# Patient Record
Sex: Female | Born: 1955 | Race: Black or African American | Hispanic: No | Smoking: Former smoker
Health system: Southern US, Community
[De-identification: ages and names within clinical notes are randomized; demographics above are authoritative.]

## PROBLEM LIST (undated history)

## (undated) DIAGNOSIS — R600 Localized edema: Secondary | ICD-10-CM

## (undated) DIAGNOSIS — M549 Dorsalgia, unspecified: Secondary | ICD-10-CM

## (undated) DIAGNOSIS — M2550 Pain in unspecified joint: Secondary | ICD-10-CM

## (undated) DIAGNOSIS — E785 Hyperlipidemia, unspecified: Secondary | ICD-10-CM

## (undated) DIAGNOSIS — T4145XA Adverse effect of unspecified anesthetic, initial encounter: Secondary | ICD-10-CM

## (undated) DIAGNOSIS — I10 Essential (primary) hypertension: Secondary | ICD-10-CM

## (undated) DIAGNOSIS — C801 Malignant (primary) neoplasm, unspecified: Secondary | ICD-10-CM

## (undated) DIAGNOSIS — R5383 Other fatigue: Secondary | ICD-10-CM

## (undated) DIAGNOSIS — D689 Coagulation defect, unspecified: Secondary | ICD-10-CM

## (undated) DIAGNOSIS — R7303 Prediabetes: Secondary | ICD-10-CM

## (undated) DIAGNOSIS — Z8711 Personal history of peptic ulcer disease: Secondary | ICD-10-CM

## (undated) DIAGNOSIS — K5900 Constipation, unspecified: Secondary | ICD-10-CM

## (undated) DIAGNOSIS — T8859XA Other complications of anesthesia, initial encounter: Secondary | ICD-10-CM

## (undated) DIAGNOSIS — R0602 Shortness of breath: Secondary | ICD-10-CM

## (undated) DIAGNOSIS — D509 Iron deficiency anemia, unspecified: Secondary | ICD-10-CM

## (undated) DIAGNOSIS — C189 Malignant neoplasm of colon, unspecified: Secondary | ICD-10-CM

## (undated) DIAGNOSIS — I82409 Acute embolism and thrombosis of unspecified deep veins of unspecified lower extremity: Secondary | ICD-10-CM

## (undated) DIAGNOSIS — E739 Lactose intolerance, unspecified: Secondary | ICD-10-CM

## (undated) DIAGNOSIS — I2699 Other pulmonary embolism without acute cor pulmonale: Secondary | ICD-10-CM

## (undated) HISTORY — DX: Other pulmonary embolism without acute cor pulmonale: I26.99

## (undated) HISTORY — DX: Prediabetes: R73.03

## (undated) HISTORY — DX: Localized edema: R60.0

## (undated) HISTORY — DX: Malignant (primary) neoplasm, unspecified: C80.1

## (undated) HISTORY — DX: Shortness of breath: R06.02

## (undated) HISTORY — DX: Pain in unspecified joint: M25.50

## (undated) HISTORY — DX: Other fatigue: R53.83

## (undated) HISTORY — DX: Lactose intolerance, unspecified: E73.9

## (undated) HISTORY — DX: Coagulation defect, unspecified: D68.9

## (undated) HISTORY — PX: HERNIA REPAIR: SHX51

## (undated) HISTORY — DX: Acute embolism and thrombosis of unspecified deep veins of unspecified lower extremity: I82.409

## (undated) HISTORY — PX: ABDOMINAL HYSTERECTOMY: SHX81

## (undated) HISTORY — DX: Dorsalgia, unspecified: M54.9

## (undated) HISTORY — DX: Personal history of peptic ulcer disease: Z87.11

## (undated) HISTORY — DX: Constipation, unspecified: K59.00

---

## 1999-09-05 ENCOUNTER — Other Ambulatory Visit: Admission: RE | Admit: 1999-09-05 | Discharge: 1999-09-05 | Payer: Self-pay | Admitting: Obstetrics and Gynecology

## 2000-11-04 ENCOUNTER — Other Ambulatory Visit: Admission: RE | Admit: 2000-11-04 | Discharge: 2000-11-04 | Payer: Self-pay | Admitting: Obstetrics and Gynecology

## 2000-12-24 ENCOUNTER — Inpatient Hospital Stay (HOSPITAL_COMMUNITY): Admission: RE | Admit: 2000-12-24 | Discharge: 2000-12-26 | Payer: Self-pay | Admitting: Obstetrics and Gynecology

## 2012-01-02 ENCOUNTER — Encounter (HOSPITAL_BASED_OUTPATIENT_CLINIC_OR_DEPARTMENT_OTHER): Payer: Self-pay | Admitting: *Deleted

## 2012-01-02 ENCOUNTER — Emergency Department (INDEPENDENT_AMBULATORY_CARE_PROVIDER_SITE_OTHER): Payer: 59

## 2012-01-02 ENCOUNTER — Emergency Department (HOSPITAL_BASED_OUTPATIENT_CLINIC_OR_DEPARTMENT_OTHER)
Admission: EM | Admit: 2012-01-02 | Discharge: 2012-01-02 | Disposition: A | Discharge status: Home / Self Care | Payer: 59 | Attending: Emergency Medicine | Admitting: Emergency Medicine

## 2012-01-02 DIAGNOSIS — R1032 Left lower quadrant pain: Secondary | ICD-10-CM | POA: Insufficient documentation

## 2012-01-02 DIAGNOSIS — I10 Essential (primary) hypertension: Secondary | ICD-10-CM | POA: Insufficient documentation

## 2012-01-02 DIAGNOSIS — K529 Noninfective gastroenteritis and colitis, unspecified: Secondary | ICD-10-CM

## 2012-01-02 DIAGNOSIS — E785 Hyperlipidemia, unspecified: Secondary | ICD-10-CM | POA: Insufficient documentation

## 2012-01-02 DIAGNOSIS — K5289 Other specified noninfective gastroenteritis and colitis: Secondary | ICD-10-CM | POA: Insufficient documentation

## 2012-01-02 DIAGNOSIS — R109 Unspecified abdominal pain: Secondary | ICD-10-CM

## 2012-01-02 HISTORY — DX: Hyperlipidemia, unspecified: E78.5

## 2012-01-02 HISTORY — DX: Essential (primary) hypertension: I10

## 2012-01-02 LAB — URINALYSIS, ROUTINE W REFLEX MICROSCOPIC
Nitrite: NEGATIVE
Specific Gravity, Urine: 1.007 (ref 1.005–1.030)
Urobilinogen, UA: 0.2 mg/dL (ref 0.0–1.0)
pH: 6.5 (ref 5.0–8.0)

## 2012-01-02 LAB — DIFFERENTIAL
Basophils Relative: 0 % (ref 0–1)
Eosinophils Absolute: 0.1 10*3/uL (ref 0.0–0.7)
Eosinophils Relative: 2 % (ref 0–5)
Monocytes Absolute: 0.4 10*3/uL (ref 0.1–1.0)
Monocytes Relative: 6 % (ref 3–12)
Neutrophils Relative %: 61 % (ref 43–77)

## 2012-01-02 LAB — LIPASE, BLOOD: Lipase: 25 U/L (ref 11–59)

## 2012-01-02 LAB — COMPREHENSIVE METABOLIC PANEL
Albumin: 3.4 g/dL — ABNORMAL LOW (ref 3.5–5.2)
BUN: 8 mg/dL (ref 6–23)
Calcium: 9.2 mg/dL (ref 8.4–10.5)
Creatinine, Ser: 0.8 mg/dL (ref 0.50–1.10)
Potassium: 3.4 mEq/L — ABNORMAL LOW (ref 3.5–5.1)
Total Protein: 6.5 g/dL (ref 6.0–8.3)

## 2012-01-02 LAB — CBC
Hemoglobin: 11.8 g/dL — ABNORMAL LOW (ref 12.0–15.0)
MCH: 29.7 pg (ref 26.0–34.0)
MCHC: 34.6 g/dL (ref 30.0–36.0)

## 2012-01-02 LAB — URINE MICROSCOPIC-ADD ON

## 2012-01-02 MED ORDER — CIPROFLOXACIN HCL 500 MG PO TABS
500.0000 mg | ORAL_TABLET | Freq: Once | ORAL | Status: AC
  Administered 2012-01-02: 500 mg via ORAL
  Filled 2012-01-02: qty 1

## 2012-01-02 MED ORDER — HYDROMORPHONE HCL PF 1 MG/ML IJ SOLN
0.5000 mg | Freq: Once | INTRAMUSCULAR | Status: AC
  Administered 2012-01-02: 0.5 mg via INTRAVENOUS
  Filled 2012-01-02: qty 1

## 2012-01-02 MED ORDER — HYDROMORPHONE HCL PF 1 MG/ML IJ SOLN
1.0000 mg | Freq: Once | INTRAMUSCULAR | Status: AC
  Administered 2012-01-02: 1 mg via INTRAVENOUS
  Filled 2012-01-02: qty 1

## 2012-01-02 MED ORDER — IOHEXOL 300 MG/ML  SOLN
20.0000 mL | INTRAMUSCULAR | Status: AC
  Administered 2012-01-02 (×2): 20 mL via ORAL

## 2012-01-02 MED ORDER — ONDANSETRON HCL 4 MG/2ML IJ SOLN
4.0000 mg | Freq: Once | INTRAMUSCULAR | Status: AC
  Administered 2012-01-02: 4 mg via INTRAVENOUS
  Filled 2012-01-02: qty 2

## 2012-01-02 MED ORDER — METRONIDAZOLE 500 MG PO TABS
500.0000 mg | ORAL_TABLET | Freq: Once | ORAL | Status: AC
  Administered 2012-01-02: 500 mg via ORAL
  Filled 2012-01-02: qty 1

## 2012-01-02 MED ORDER — HYDROCODONE-ACETAMINOPHEN 5-325 MG PO TABS: 2.0000 | ORAL_TABLET | ORAL | Status: AC | PRN

## 2012-01-02 MED ORDER — IOHEXOL 300 MG/ML  SOLN
100.0000 mL | Freq: Once | INTRAMUSCULAR | Status: AC | PRN
  Administered 2012-01-02: 100 mL via INTRAVENOUS

## 2012-01-02 MED ORDER — CIPROFLOXACIN HCL 500 MG PO TABS: 500.0000 mg | ORAL_TABLET | Freq: Two times a day (BID) | ORAL | Status: AC

## 2012-01-02 MED ORDER — METRONIDAZOLE 500 MG PO TABS: 500.0000 mg | ORAL_TABLET | Freq: Two times a day (BID) | ORAL | Status: AC

## 2012-01-02 NOTE — ED Notes (Signed)
Secondary Assessment- Pt reports abdominal cramping for "a while" and has worsened over the past month.  She has been seen by OBGYN, had an ultrasound and referred to GI.  She has an appointment next week but states pain is severe.  SHe has been taking up to 8 500mg  Tylenol daily for pain.  She has also been taking stool softners and laxatives for constipation. LBM was today but "not normal".

## 2012-01-02 NOTE — ED Notes (Signed)
Pt c/o lower abd cramping x 1 month, recent US done

## 2012-01-02 NOTE — ED Provider Notes (Signed)
History     CSN: [id number]  Arrival date & time 01/02/12  4098   First MD Initiated Contact with Patient 01/02/12 1851      Chief Complaint  Patient presents with  . Abdominal Cramping    (Consider location/radiation/quality/duration/timing/severity/associated sxs/prior treatment) Patient is a 56 y.o. female presenting with abdominal pain. The history is provided by the patient. No language interpreter was used.  Abdominal Pain The primary symptoms of the illness include abdominal pain. The current episode started more than 2 days ago. The onset of the illness was gradual. The problem has been gradually worsening.  Associated with: constipation. The patient states that she believes she is currently not pregnant. The patient has not had a change in bowel habit. Additional symptoms associated with the illness include constipation. Symptoms associated with the illness do not include chills or anorexia. Significant associated medical issues do not include PUD, GERD or inflammatory bowel disease.  Pt reports she saw Dr. Jackelyn Knife for lower abdominal pain and had an ultrasound which showed normal ovaries.  Pt teports they told her they see something on her bowel.  Pt is scheduled to see Dr. Elnoria Howard on Monday.   Past Medical History  Diagnosis Date  . Hypertension   . Hyperlipemia     Past Surgical History  Procedure Date  . Abdominal hysterectomy     History reviewed. No pertinent family history.  History  Substance Use Topics  . Smoking status: Never Smoker   . Smokeless tobacco: Not on file  . Alcohol Use:     OB History    Grav Para Term Preterm Abortions TAB SAB Ect Mult Living                  Review of Systems  Constitutional: Negative for chills.  Gastrointestinal: Positive for abdominal pain and constipation. Negative for anorexia.  All other systems reviewed and are negative.    Allergies  Strawberry  Home Medications   Current Outpatient Rx  Name Route  Sig Dispense Refill  . ACETAMINOPHEN 500 MG PO TABS Oral Take 500 mg by mouth every 6 (six) hours as needed. For pain    . HYDROCHLOROTHIAZIDE 25 MG PO TABS Oral Take 25 mg by mouth daily.    Marland Kitchen SIMVASTATIN 40 MG PO TABS Oral Take 40 mg by mouth daily.      BP 138/95  Pulse 86  Temp(Src) 97.9 F (36.6 C) (Oral)  Resp 18  Ht 5\' 5"  (1.651 m)  Wt 160 lb (72.576 kg)  BMI 26.63 kg/m2  SpO2 96%  Physical Exam  Nursing note and vitals reviewed. Constitutional: She is oriented to person, place, and time. She appears well-developed and well-nourished.  HENT:  Head: Normocephalic.  Right Ear: External ear normal.  Eyes: Pupils are equal, round, and reactive to light.  Neck: Normal range of motion.  Cardiovascular: Normal rate and normal heart sounds.   Pulmonary/Chest: Effort normal.  Abdominal: Soft. There is tenderness.  Musculoskeletal: Normal range of motion.  Neurological: She is alert and oriented to person, place, and time.  Skin: Skin is warm and dry.  Psychiatric: She has a normal mood and affect.    ED Course  Procedures (including critical care time)  Labs Reviewed  CBC - Abnormal; Notable for the following:    Hemoglobin 11.8 (*)    HCT 34.1 (*)    All other components within normal limits  COMPREHENSIVE METABOLIC PANEL - Abnormal; Notable for the following:    Potassium  3.4 (*)    Glucose, Bld 103 (*)    Albumin 3.4 (*)    GFR calc non Af Amer 83 (*)    All other components within normal limits  URINALYSIS, ROUTINE W REFLEX MICROSCOPIC - Abnormal; Notable for the following:    Leukocytes, UA SMALL (*)    All other components within normal limits  DIFFERENTIAL  LIPASE, BLOOD  URINE MICROSCOPIC-ADD ON   Ct Abdomen Pelvis W Contrast  01/02/2012  *RADIOLOGY REPORT*  Clinical Data: Left lower quadrant right lower quadrant abdominal pain for 1 month, history hypertension, hysterectomy  CT ABDOMEN AND PELVIS WITH CONTRAST  Technique:  Multidetector CT imaging of the  abdomen and pelvis was performed following the standard protocol during bolus administration of intravenous contrast. Sagittal and coronal MPR images reconstructed from axial data set.  Contrast: OMNIPAQUE IOHEXOL 300 MG/ML IV SOLN. Dilute oral contrast.  Comparison: None  Findings: Mild dependent atelectasis at lung bases. Liver, spleen, pancreas, kidneys, and adrenal glands normal. Stomach and small bowel loops unremarkable. Small amount nonspecific free pelvic fluid. Uterus surgically absent with unremarkable ovaries. Normal appendix.  Mild wall thickening of distal descending and proximal sigmoid colon. Minimal pericolic edema posterolateral to the thickened segment. Colon proximal to this is slightly dilated without additional wall thickening. Decompression of distal sigmoid colon and rectum. No mass, adenopathy, or hernia. No acute osseous findings. Degenerative disc disease changes L5-S1.  IMPRESSION: Segmental wall thickening of colon at distal descending/sigmoid colon junction with minimal infiltration of pericolic fat. This could represent a focal inflammatory process such as segmental colitis, acute diverticulitis (though no definite colonic diverticula identified), or neoplasm. Colonoscopy recommended. The presence of mild distention of the colon proximal to the area of wall thickening raises a question of partial obstruction.  Original Report Authenticated By: Lollie Marrow, M.D.     1. Colitis   2. Abdominal pain       MDM  Pt given iv fluids and pain medication.  Pt reports some relief.  Ct scan shows inflammation.   Dr. Ignacia Palma and I counseled pt,  We will treat with cipro and flagyl.  Pt given copy of ct for Dr. Elnoria Howard.  Pt counseled on possibility of mass/neoplasm.  Pt understands the need for colonoscopy.  Medical screening examination/treatment/procedure(s) were conducted as a shared visit with non-physician practitioner(s) and myself.  I personally evaluated the patient during  the encounter Pt is a 56 year old woman with a 2 day history of abdominal pain.  Exam shows mild LLQ tenderness.  Lab tests show mild anemia.  CT of the abdomen/plvis shows wall thickening and associated edema of the distal descending and proximal sigmoid colon.  Will plan to treat as colitis.  She has appointment to see Dr. Elnoria Howard, gastroenterologist, on Monday, 3 days from now.   Osvaldo Human, M.D.      [place], [place] 01/02/12 2123  Carleene Cooper III, MD 01/03/12 1309

## 2012-01-02 NOTE — ED Notes (Signed)
Karen Sofia, PA-C at bedside 

## 2012-01-02 NOTE — ED Notes (Signed)
Pt has appt w/ Gi on Monday

## 2012-01-13 ENCOUNTER — Inpatient Hospital Stay (HOSPITAL_COMMUNITY): Payer: 59

## 2012-01-13 ENCOUNTER — Encounter (HOSPITAL_COMMUNITY): Payer: Self-pay | Admitting: General Practice

## 2012-01-13 ENCOUNTER — Inpatient Hospital Stay (HOSPITAL_COMMUNITY)
Admission: AD | Admit: 2012-01-13 | Discharge: 2012-01-27 | DRG: 329 | Disposition: A | Discharge status: Home / Self Care | Payer: 59 | Source: Ambulatory Visit | Attending: General Surgery | Admitting: General Surgery

## 2012-01-13 DIAGNOSIS — I824Z9 Acute embolism and thrombosis of unspecified deep veins of unspecified distal lower extremity: Secondary | ICD-10-CM | POA: Diagnosis not present

## 2012-01-13 DIAGNOSIS — Z7901 Long term (current) use of anticoagulants: Secondary | ICD-10-CM

## 2012-01-13 DIAGNOSIS — C186 Malignant neoplasm of descending colon: Principal | ICD-10-CM | POA: Diagnosis present

## 2012-01-13 DIAGNOSIS — E8779 Other fluid overload: Secondary | ICD-10-CM | POA: Diagnosis not present

## 2012-01-13 DIAGNOSIS — N289 Disorder of kidney and ureter, unspecified: Secondary | ICD-10-CM | POA: Diagnosis not present

## 2012-01-13 DIAGNOSIS — Z90710 Acquired absence of both cervix and uterus: Secondary | ICD-10-CM

## 2012-01-13 DIAGNOSIS — Z91018 Allergy to other foods: Secondary | ICD-10-CM

## 2012-01-13 DIAGNOSIS — E785 Hyperlipidemia, unspecified: Secondary | ICD-10-CM | POA: Diagnosis present

## 2012-01-13 DIAGNOSIS — R000 Tachycardia, unspecified: Secondary | ICD-10-CM | POA: Diagnosis present

## 2012-01-13 DIAGNOSIS — Z860100 Personal history of colon polyps, unspecified: Secondary | ICD-10-CM

## 2012-01-13 DIAGNOSIS — I2699 Other pulmonary embolism without acute cor pulmonale: Secondary | ICD-10-CM | POA: Diagnosis not present

## 2012-01-13 DIAGNOSIS — I82419 Acute embolism and thrombosis of unspecified femoral vein: Secondary | ICD-10-CM | POA: Diagnosis present

## 2012-01-13 DIAGNOSIS — K929 Disease of digestive system, unspecified: Secondary | ICD-10-CM | POA: Diagnosis not present

## 2012-01-13 DIAGNOSIS — K560 Paralytic ileus: Secondary | ICD-10-CM | POA: Diagnosis not present

## 2012-01-13 DIAGNOSIS — F411 Generalized anxiety disorder: Secondary | ICD-10-CM | POA: Diagnosis not present

## 2012-01-13 DIAGNOSIS — K6389 Other specified diseases of intestine: Secondary | ICD-10-CM | POA: Diagnosis present

## 2012-01-13 DIAGNOSIS — D509 Iron deficiency anemia, unspecified: Secondary | ICD-10-CM | POA: Diagnosis present

## 2012-01-13 DIAGNOSIS — K56609 Unspecified intestinal obstruction, unspecified as to partial versus complete obstruction: Secondary | ICD-10-CM

## 2012-01-13 DIAGNOSIS — D62 Acute posthemorrhagic anemia: Secondary | ICD-10-CM | POA: Diagnosis not present

## 2012-01-13 DIAGNOSIS — R0609 Other forms of dyspnea: Secondary | ICD-10-CM | POA: Diagnosis not present

## 2012-01-13 DIAGNOSIS — M549 Dorsalgia, unspecified: Secondary | ICD-10-CM | POA: Diagnosis not present

## 2012-01-13 DIAGNOSIS — K567 Ileus, unspecified: Secondary | ICD-10-CM | POA: Diagnosis not present

## 2012-01-13 DIAGNOSIS — R0989 Other specified symptoms and signs involving the circulatory and respiratory systems: Secondary | ICD-10-CM | POA: Diagnosis not present

## 2012-01-13 DIAGNOSIS — I824Y9 Acute embolism and thrombosis of unspecified deep veins of unspecified proximal lower extremity: Secondary | ICD-10-CM | POA: Diagnosis not present

## 2012-01-13 DIAGNOSIS — E876 Hypokalemia: Secondary | ICD-10-CM | POA: Diagnosis present

## 2012-01-13 DIAGNOSIS — Z800 Family history of malignant neoplasm of digestive organs: Secondary | ICD-10-CM

## 2012-01-13 DIAGNOSIS — E8770 Fluid overload, unspecified: Secondary | ICD-10-CM | POA: Diagnosis present

## 2012-01-13 DIAGNOSIS — I10 Essential (primary) hypertension: Secondary | ICD-10-CM | POA: Diagnosis present

## 2012-01-13 DIAGNOSIS — N179 Acute kidney failure, unspecified: Secondary | ICD-10-CM | POA: Diagnosis not present

## 2012-01-13 HISTORY — DX: Iron deficiency anemia, unspecified: D50.9

## 2012-01-13 LAB — COMPREHENSIVE METABOLIC PANEL
ALT: 8 U/L (ref 0–35)
AST: 14 U/L (ref 0–37)
Albumin: 3.3 g/dL — ABNORMAL LOW (ref 3.5–5.2)
CO2: 29 mEq/L (ref 19–32)
Calcium: 9.1 mg/dL (ref 8.4–10.5)
Creatinine, Ser: 0.7 mg/dL (ref 0.50–1.10)
GFR calc non Af Amer: >90 mL/min (ref >90)
Sodium: 141 mEq/L (ref 135–145)

## 2012-01-13 LAB — TYPE AND SCREEN
ABO/RH(D): B POS
Antibody Screen: NEGATIVE

## 2012-01-13 LAB — CBC
HCT: 34.3 % — ABNORMAL LOW (ref 36.0–46.0)
Hemoglobin: 12.2 g/dL (ref 12.0–15.0)
MCH: 30 pg (ref 26.0–34.0)
MCHC: 35.6 g/dL (ref 30.0–36.0)
MCV: 84.3 fL (ref 78.0–100.0)
Platelets: 216 10*3/uL (ref 150–400)
RBC: 4.07 MIL/uL (ref 3.87–5.11)
RDW: 12.3 % (ref 11.5–15.5)
WBC: 6.4 10*3/uL (ref 4.0–10.5)

## 2012-01-13 MED ORDER — SODIUM CHLORIDE 0.9 % IV SOLN
INTRAVENOUS | Status: DC
  Administered 2012-01-13: 14:00:00 via INTRAVENOUS

## 2012-01-13 MED ORDER — POTASSIUM CHLORIDE 10 MEQ/100ML IV SOLN
10.0000 meq | INTRAVENOUS | Status: AC
  Administered 2012-01-13 (×2): 10 meq via INTRAVENOUS
  Filled 2012-01-13 (×4): qty 100

## 2012-01-13 MED ORDER — POTASSIUM CHLORIDE IN NACL 20-0.45 MEQ/L-% IV SOLN
INTRAVENOUS | Status: DC
  Administered 2012-01-13: 19:00:00 via INTRAVENOUS
  Filled 2012-01-13 (×6): qty 1000

## 2012-01-13 MED ORDER — POTASSIUM CHLORIDE 10 MEQ/100ML IV SOLN
10.0000 meq | INTRAVENOUS | Status: DC
  Administered 2012-01-13 (×2): 10 meq via INTRAVENOUS
  Filled 2012-01-13 (×4): qty 100

## 2012-01-13 MED ORDER — MAGNESIUM CITRATE PO SOLN
1.0000 | Freq: Once | ORAL | Status: DC
  Filled 2012-01-13: qty 296

## 2012-01-13 MED ORDER — FLEET ENEMA 7-19 GM/118ML RE ENEM
1.0000 | ENEMA | Freq: Once | RECTAL | Status: AC
  Administered 2012-01-13: 1 via RECTAL
  Filled 2012-01-13: qty 1

## 2012-01-13 MED ORDER — MORPHINE SULFATE 2 MG/ML IJ SOLN
2.0000 mg | INTRAMUSCULAR | Status: DC | PRN
  Administered 2012-01-13 – 2012-01-14 (×5): 2 mg via INTRAVENOUS
  Filled 2012-01-13 (×5): qty 1

## 2012-01-13 MED ORDER — HYDROCHLOROTHIAZIDE 25 MG PO TABS
25.0000 mg | ORAL_TABLET | Freq: Every day | ORAL | Status: DC
  Administered 2012-01-13: 25 mg via ORAL
  Filled 2012-01-13 (×3): qty 1

## 2012-01-13 NOTE — Consult Note (Signed)
Reason for Consult:Colon Obstruction Referring Physician: Dr. Jeani Hawking  Mallory Morales is an 56 y.o. female.  HPI: Patient is a 56 year old female who reports the onset of abdominal pain which started July 4. She attributed to eating a stay. Pain would come and go but become progressively worse. Approximately 2 weeks ago she saw her OB/GYN doctor what she described as premenstrual pain.  She had prior Hysterectomy. Ultrasound was negative, but, 2/3 stool guaiacs were positive.  She has been taking up to 8 tylenol per day for pain. Pain was finally uncontrolled by Tylenol and she was seen at the ER [facility]. A CT scan showed thickening of the distal ascending and sigmoid colon, concerning for colitis. She was placed on Flagyl and Cipro by mouth, and scheduled to see Dr. Elnoria Howard. She has a history of colonoscopy by Dr. Noe Gens and [facility] 06/20/2009 there were some hyperplastic polyps in the transverse colon in the sigmoid at 15 cm. Patient attempted prepped for colonoscopy twice and vomited the prep. She was ultimately prepped with 2 fleets enemas yesterday and underwent flexible sigmoidoscopy today by Dr. 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 She reports her last bowel movement was 01/09/12. She continues to have abdominal pain and currently points to her left lower quadrant as the source of major discomfort. She reports about a 10-12 pound weight loss since July on her scales at home.  Past Medical History  Diagnosis Date  . Hypertension   . Hyperlipemia     Past Surgical History  Procedure Date  . Abdominal hysterectomy   . Hernia repair   Hernia repair at age 26.  History reviewed. No pertinent family history. Father: died at 46 withCHF Mother - High blood pressure 1Bro -  deceased with Pancreatic Ca. 1 Sister - OK  Social History:  reports that she has never smoked. She has quit using smokeless tobacco. She reports that she does not use illicit drugs. Her alcohol history not on file.  Allergies:  Allergies  Allergen Reactions  . Strawberry Swelling    Medications:  Prior to Admission:  Prescriptions prior to admission  Medication Sig Dispense Refill  . acetaminophen (TYLENOL) 500 MG tablet Take 500 mg by mouth every 6 (six) hours as needed. For pain      . hydrochlorothiazide (HYDRODIURIL) 25 MG tablet Take 25 mg by mouth daily.      Marland Kitchen HYDROcodone-acetaminophen (NORCO) 5-325 MG per tablet Take 2 tablets by mouth every 4 (four) hours as needed. For pain      . simvastatin (ZOCOR) 40 MG tablet Take 40 mg by mouth daily.      . ciprofloxacin (CIPRO) 500 MG tablet Take 1 tablet (500 mg total) by mouth 2 (two) times daily.  20 tablet  0  . HYDROcodone-acetaminophen (NORCO) 5-325 MG per tablet Take 2 tablets by mouth every 4 (four) hours as needed for pain.  20 tablet  0  . metroNIDAZOLE (FLAGYL) 500 MG tablet Take 1 tablet (500 mg total) by mouth 2 (two) times daily.  20 tablet  0   Scheduled:   . hydrochlorothiazide  25 mg Oral Daily   Continuous:   . sodium chloride 125 mL/hr at 01/13/12 1409    No results found for this or any previous visit (from  the past 48 hour(s)).  No results found.  Review of Systems  Constitutional: Positive for weight loss (10-12 lbs on her home scale.  ). Negative for fever, chills, malaise/fatigue and diaphoresis.  HENT: Negative.   Eyes: Negative.   Respiratory: Negative for cough, hemoptysis, sputum production, shortness of breath and wheezing.   Cardiovascular: Negative for chest pain, palpitations, orthopnea, claudication, leg swelling and PND.  Gastrointestinal: Positive for nausea (on and off, but nothing severe.), vomiting (with bowel prep), abdominal pain (Pain Left and right initially.  Now c/o pain LLQ.),  constipation and blood in stool (On guiac with W/U 2 weeks ago .). Negative for heartburn. Diarrhea: Last BM 01/09/12,  about a cup or so with fleets enemas before Flex sig.  Genitourinary:       Decreased urine output  Musculoskeletal: Negative.   Skin: Negative.   Neurological: Negative.  Negative for weakness.  Endo/Heme/Allergies: Negative.   Psychiatric/Behavioral: Negative.    Blood pressure 136/85, pulse 101, temperature 98 F (36.7 C), temperature source Oral, resp. rate 18, height 5\' 5"  (1.651 m), weight 72.848 kg (160 lb 9.6 oz), SpO2 95.00%. Physical Exam  Constitutional: She is oriented to person, place, and time. She appears well-developed and well-nourished. No distress.  HENT:  Head: Normocephalic and atraumatic.  Eyes: Conjunctivae and EOM are normal. Pupils are equal, round, and reactive to light. Right eye exhibits no discharge. Left eye exhibits no discharge.  Neck: Normal range of motion. Neck supple. No JVD present. No tracheal deviation present. No thyromegaly present.  Cardiovascular: Regular rhythm and intact distal pulses.  Exam reveals no gallop and no friction rub.   Murmur heard.      tachycardic  Respiratory: Effort normal and breath sounds normal. No respiratory distress. She has no wheezes. She has no rales. She exhibits no tenderness.  GI: Soft. Bowel sounds are normal. She exhibits no distension and no mass. There is no tenderness. There is no rebound and no guarding.  Musculoskeletal: She exhibits no edema and no tenderness.  Neurological: She is alert and oriented to person, place, and time. She has normal reflexes. No cranial nerve deficit. Coordination normal.  Skin: Skin is warm and dry. No rash noted. No erythema. No pallor.  Psychiatric: She has a normal mood and affect. Her behavior is normal. Judgment and thought content normal.       Pretty upset at the thought of a colostomy.    Assessment/Plan: 1.Obstructing colon mass, probable cancer. Hx of  Hyperplastic polyps 05/2009. 2.Hypertension 3.Dyslipidemia  Plan:  We are going to try a gentle prep with Magnesium Citrate, before attempting to resect her mass.  Hopefully this would allow [place] to do a primary closure as instead of a colostomy. If she is unable to tolerate this and cannot clear her colon she will most likely require a colostomy.   Castulo Scarpelli 01/13/2012, 2:26 PM

## 2012-01-13 NOTE — Progress Notes (Signed)
Mallory Morales [id number] Code Status: Full   Admission Data: 01/13/2012 4:07 PM Attending Provider:  Elnoria Howard WUJ:[id number] Not In System Consults/ Treatment Team: Treatment Team:  Md Ccs, MD  Mallory Morales is a 56 y.o. female patient directly admitted from Dr. Haywood Pao office awake, alert - oriented  X 3 - no acute distress noted.  VSS - Blood pressure 136/85, pulse 101, temperature 98 F (36.7 C), temperature source Oral, resp. rate 18, height 5\' 5"  (1.651 m), weight 72.848 kg (160 lb 9.6 oz), SpO2 95.00%.   IV Fluids:  IV in place, occlusive dsg intact without redness, IV cath right hand 22 gauge 1/22 NS @ 125cc/hour  Allergies:   Allergies  Allergen Reactions  . Strawberry Swelling     Past Medical History  Diagnosis Date  . Hypertension   . Hyperlipemia    Medications Prior to Admission  Medication Dose Route Frequency Provider Last Rate Last Dose  . 0.45 % NaCl with KCl 20 mEq / L infusion   Intravenous Continuous Theda Belfast, MD      . hydrochlorothiazide (HYDRODIURIL) tablet 25 mg  25 mg Oral Daily Theda Belfast, MD      . morphine 2 MG/ML injection 2 mg  2 mg Intravenous Q3H PRN Theda Belfast, MD   2 mg at 01/13/12 1409  . potassium chloride 10 mEq in 100 mL IVPB  10 mEq Intravenous Q1 Hr x 4 Theda Belfast, MD      . potassium chloride 10 mEq in 100 mL IVPB  10 mEq Intravenous Q1 Hr x 4 Theda Belfast, MD      . sodium phosphate (FLEET) 7-19 GM/118ML enema 1 enema  1 enema Rectal Once Adolph Pollack, MD      . DISCONTD: 0.9 %  sodium chloride infusion   Intravenous Continuous Theda Belfast, MD 125 mL/hr at 01/13/12 1409    . DISCONTD: magnesium citrate solution 1 Bottle  1 Bottle Oral Once Sherrie George, [place]       Medications Prior to Admission  Medication Sig Dispense Refill  . acetaminophen (TYLENOL) 500 MG tablet Take 500 mg by mouth every 6 (six) hours as needed. For pain      . hydrochlorothiazide (HYDRODIURIL) 25 MG tablet Take 25 mg by mouth daily.       . simvastatin (ZOCOR) 40 MG tablet Take 40 mg by mouth daily.      . ciprofloxacin (CIPRO) 500 MG tablet Take 1 tablet (500 mg total) by mouth 2 (two) times daily.  20 tablet  0  . HYDROcodone-acetaminophen (NORCO) 5-325 MG per tablet Take 2 tablets by mouth every 4 (four) hours as needed for pain.  20 tablet  0  . metroNIDAZOLE (FLAGYL) 500 MG tablet Take 1 tablet (500 mg total) by mouth 2 (two) times daily.  20 tablet  0    Tobacco/alcohol: none  Orientation to room, and floor completed with information packet given to patient/family.  Patient declined safety video at this time.  Admission INP armband ID verified with patient/family, and in place.   SR up x 2, fall assessment complete, with patient and family able to verbalize understanding of risk associated with falls, and verbalized understanding to call nsg before up out of bed.  Call light within reach, patient able to voice, and demonstrate understanding.  Skin, clean-dry- intact without evidence of bruising, or skin tears.   No evidence of skin break down noted on exam.  Will cont to eval and treat per MD orders.  Driggers, [place], RN 01/13/2012 4:07 PM

## 2012-01-13 NOTE — Consult Note (Signed)
Patient seen and examined.  Discussed with Dr. Elnoria Howard.  She has an obstructing left colon mass and is currently having pain and distension.  Unfortunately, she will not tolerate a bowel prep and Dr. Elnoria Howard has order an ng tube for her.  Plan to give her a Fleets enema and then perform a partial colectomy and colostomy tomorrow.  I have explained the procedure and risks of colon resection.  Risks include but are not limited to bleeding, infection, wound problems, anesthesia, need for colostomy, injury to intraabominal organs (such as intestine, spleen, kidney, bladder, ureter, etc.), ileus, irregular bowel habits.  She seems to understand and agrees to proceed.

## 2012-01-13 NOTE — H&P (Signed)
Mallory Morales is an 56 y.o. female.   Chief Complaint: Obstructive sigmoid mass HPI: This is a 56 year old female with complaints of left sided abdominal pain since July 4th 2012.  At that time, until recently, she was able to medicate it with Tylenol.  She did not feel that it was that significant, but on January 11th her pain markedly worsened and she sought care from the ER.  A CT scan was performed at that time and she was noted to have a segment of the proximal sigmoid colon that was thickened.  No overt lesion was identified, but a malignancy was still a consideration.  The thought was that she had a colitis in that area and she was treated with Flagyl and Cipro.  This did not resolve her pain and she was also evaluated in the office.  With the findings of the CT scan and her persistent complaints a colonoscopy was going to be performed.  She had a colonoscopy by Dr. Noe Morales in [place] on 06/20/2009 and the findings were only significant for a hyperplastic polyp in the transverse colon and sigmoid colon (15 cm).  No adenomas were detected.  She tried to prep for the colonoscopy on two separate occasions, but she vomited the prep.  Despite the inability to prep orally she was told to come into the office to undergo a FFS today.  She did manage to prep with two Fleets enemas yesterday.  The FFS revealed a large hard obstructing sigmoid lesions consistent with a sigmoid colon cancer.  This correlates with the CT scan site of abnormality and where she points to the pain in her abdomen.  Multiple cold biopsies were obtained and the site was tattooed with SPOT.  As a result of the findings was directly admitted to the hospital for further evaluation and treatment.  Past Medical History  Diagnosis Date  . Hypertension   . Hyperlipemia     Past Surgical History  Procedure Date  . Abdominal hysterectomy     No family history on file. Social History:  reports that she has never smoked. She does not  have any smokeless tobacco history on file. She reports that she does not use illicit drugs. Her alcohol history not on file.  Allergies:  Allergies  Allergen Reactions  . Strawberry Swelling    Medications Prior to Admission  Medication Dose Route Frequency Provider Last Rate Last Dose  . 0.9 %  sodium chloride infusion   Intravenous Continuous Theda Belfast, MD      . hydrochlorothiazide (HYDRODIURIL) tablet 25 mg  25 mg Oral Daily Theda Belfast, MD      . morphine 2 MG/ML injection 2 mg  2 mg Intravenous Q3H PRN Theda Belfast, MD       Medications Prior to Admission  Medication Sig Dispense Refill  . acetaminophen (TYLENOL) 500 MG tablet Take 500 mg by mouth every 6 (six) hours as needed. For pain      . hydrochlorothiazide (HYDRODIURIL) 25 MG tablet Take 25 mg by mouth daily.      . simvastatin (ZOCOR) 40 MG tablet Take 40 mg by mouth daily.      . ciprofloxacin (CIPRO) 500 MG tablet Take 1 tablet (500 mg total) by mouth 2 (two) times daily.  20 tablet  0  . HYDROcodone-acetaminophen (NORCO) 5-325 MG per tablet Take 2 tablets by mouth every 4 (four) hours as needed for pain.  20 tablet  0  .  metroNIDAZOLE (FLAGYL) 500 MG tablet Take 1 tablet (500 mg total) by mouth 2 (two) times daily.  20 tablet  0    No results found for this or any previous visit (from the past 48 hour(s)). No results found.  ROS:  As stated above in the HPI, otherwise negative.  General appearance: alert and no distress Resp: clear to auscultation bilaterally Cardio: regular rate and rhythm, S1, S2 normal, no murmur, click, rub or gallop GI: soft, but appears to be mildly distended, no significant pain with palpation Extremities: extremities normal, atraumatic, no cyanosis or edema There were no vitals taken for this visit.   Assessment/Plan 1) Obstructing sigmoid colon mass consistent with a colon cancer. 2) HTN.   She will require a surgical evaluation as she is obstructed at this  time.  Plan: 1) Surgical consultation. 2) HCTZ 25 mg QD. 3) Pain control with morphine. 4) KUB.  Pending the results she may benefit from an NG tube.  Mallory Morales D 01/13/2012, 12:50 PM

## 2012-01-13 NOTE — Progress Notes (Signed)
CRITICAL VALUE ALERT  Critical value received:  K 2.6  Date of notification:  01/13/2012  Time of notification:  Driggers, Rae Roam  Critical value read back:yes  Nurse who received alert:  Driggers, Rae Roam   MD notified (1st page):  Elnoria Howard  Time of first page:  3:29 PM   MD notified (2nd page):  Time of second page:  Responding MD: Elnoria Howard  Time MD responded:  3:30 PM

## 2012-01-14 ENCOUNTER — Encounter (HOSPITAL_COMMUNITY): Payer: Self-pay | Admitting: Anesthesiology

## 2012-01-14 ENCOUNTER — Other Ambulatory Visit: Payer: Self-pay

## 2012-01-14 ENCOUNTER — Other Ambulatory Visit (INDEPENDENT_AMBULATORY_CARE_PROVIDER_SITE_OTHER): Payer: Self-pay | Admitting: General Surgery

## 2012-01-14 ENCOUNTER — Inpatient Hospital Stay (HOSPITAL_COMMUNITY): Payer: 59 | Admitting: Anesthesiology

## 2012-01-14 ENCOUNTER — Encounter (HOSPITAL_COMMUNITY): Admission: AD | Disposition: A | Discharge status: Home / Self Care | Payer: Self-pay | Source: Ambulatory Visit

## 2012-01-14 DIAGNOSIS — C189 Malignant neoplasm of colon, unspecified: Secondary | ICD-10-CM

## 2012-01-14 HISTORY — PX: COLOSTOMY: SHX63

## 2012-01-14 HISTORY — PX: COLON RESECTION: SHX5231

## 2012-01-14 LAB — COMPREHENSIVE METABOLIC PANEL
ALT: 8 U/L (ref 0–35)
BUN: 8 mg/dL (ref 6–23)
Calcium: 9.1 mg/dL (ref 8.4–10.5)
GFR calc Af Amer: >90 mL/min (ref >90)
Glucose, Bld: 81 mg/dL (ref 70–99)
Sodium: 141 mEq/L (ref 135–145)
Total Protein: 6.4 g/dL (ref 6.0–8.3)

## 2012-01-14 LAB — CBC
Hemoglobin: 14 g/dL (ref 12.0–15.0)
MCH: 29.4 pg (ref 26.0–34.0)
MCH: 29.9 pg (ref 26.0–34.0)
MCV: 86.5 fL (ref 78.0–100.0)
Platelets: 240 10*3/uL (ref 150–400)
Platelets: 310 10*3/uL (ref 150–400)
RBC: 4.25 MIL/uL (ref 3.87–5.11)
RBC: 4.68 MIL/uL (ref 3.87–5.11)
RDW: 12.6 % (ref 11.5–15.5)
WBC: 7.7 10*3/uL (ref 4.0–10.5)

## 2012-01-14 LAB — BASIC METABOLIC PANEL
GFR calc Af Amer: >90 mL/min (ref >90)
GFR calc non Af Amer: >90 mL/min (ref >90)
Potassium: 2.8 mEq/L — ABNORMAL LOW (ref 3.5–5.1)
Sodium: 140 mEq/L (ref 135–145)

## 2012-01-14 LAB — PROTIME-INR
INR: 1.18 (ref 0.00–1.49)
Prothrombin Time: 15.2 seconds (ref 11.6–15.2)

## 2012-01-14 LAB — SURGICAL PCR SCREEN
MRSA, PCR: NEGATIVE
Staphylococcus aureus: NEGATIVE

## 2012-01-14 LAB — MAGNESIUM: Magnesium: 1.8 mg/dL (ref 1.5–2.5)

## 2012-01-14 SURGERY — COLON RESECTION
Anesthesia: General | Site: Abdomen | Wound class: Dirty or Infected

## 2012-01-14 MED ORDER — HYDROMORPHONE HCL PF 1 MG/ML IJ SOLN
0.2500 mg | INTRAMUSCULAR | Status: DC | PRN
  Administered 2012-01-14 – 2012-01-24 (×5): 0.5 mg via INTRAVENOUS
  Filled 2012-01-14: qty 1

## 2012-01-14 MED ORDER — MORPHINE SULFATE (PF) 1 MG/ML IV SOLN
INTRAVENOUS | Status: DC
  Administered 2012-01-14: 13.5 mg via INTRAVENOUS
  Administered 2012-01-14: 13:00:00 via INTRAVENOUS
  Administered 2012-01-14: 3 mg via INTRAVENOUS
  Administered 2012-01-15: 9 mg via INTRAVENOUS
  Administered 2012-01-15: 3 mg via INTRAVENOUS
  Administered 2012-01-15: 1.5 mg via INTRAVENOUS
  Administered 2012-01-15: 1 mL via INTRAVENOUS
  Administered 2012-01-15: 14.3 mg via INTRAVENOUS
  Administered 2012-01-15: 02:00:00 via INTRAVENOUS
  Administered 2012-01-15: 1.5 mL via INTRAVENOUS
  Administered 2012-01-16: 7.5 mg via INTRAVENOUS
  Administered 2012-01-16: 13.49 mg via INTRAVENOUS
  Administered 2012-01-16: 1.5 mg via INTRAVENOUS
  Administered 2012-01-16: 4.5 mg via INTRAVENOUS
  Administered 2012-01-16: 16.29 mg via INTRAVENOUS
  Administered 2012-01-16: 8 mg via INTRAVENOUS
  Administered 2012-01-16: 7.5 mg via INTRAVENOUS
  Administered 2012-01-17: 7.91 mL via INTRAVENOUS
  Administered 2012-01-17: 19.45 mL via INTRAVENOUS
  Administered 2012-01-17: 7.5 mg via INTRAVENOUS
  Administered 2012-01-17: 1 mg via INTRAVENOUS
  Administered 2012-01-17: 04:00:00 via INTRAVENOUS
  Administered 2012-01-17: 12 mg via INTRAVENOUS
  Administered 2012-01-18 (×2): 6 mL via INTRAVENOUS
  Administered 2012-01-18: 14.5 mL via INTRAVENOUS
  Administered 2012-01-18: 20:00:00 via INTRAVENOUS
  Administered 2012-01-18: 5 mg via INTRAVENOUS
  Administered 2012-01-18: 10:00:00 via INTRAVENOUS
  Administered 2012-01-18: 13.32 mL via INTRAVENOUS
  Administered 2012-01-19: 1.5 mg via INTRAVENOUS
  Administered 2012-01-19: 3 mg via INTRAVENOUS
  Filled 2012-01-14 (×10): qty 25

## 2012-01-14 MED ORDER — PROPRANOLOL HCL 1 MG/ML IV SOLN
INTRAVENOUS | Status: AC
  Administered 2012-01-14: 1 mg
  Filled 2012-01-14: qty 1

## 2012-01-14 MED ORDER — MENTHOL 3 MG MT LOZG
1.0000 | LOZENGE | OROMUCOSAL | Status: DC | PRN
  Administered 2012-01-16: 3 mg via ORAL
  Filled 2012-01-14: qty 9

## 2012-01-14 MED ORDER — DIPHENHYDRAMINE HCL 50 MG/ML IJ SOLN: 12.5000 mg | Freq: Four times a day (QID) | INTRAMUSCULAR | Status: DC | PRN

## 2012-01-14 MED ORDER — DIPHENHYDRAMINE HCL 12.5 MG/5ML PO ELIX
12.5000 mg | ORAL_SOLUTION | Freq: Four times a day (QID) | ORAL | Status: DC | PRN
  Filled 2012-01-14: qty 5

## 2012-01-14 MED ORDER — HEPARIN SODIUM (PORCINE) 5000 UNIT/ML IJ SOLN
5000.0000 [IU] | Freq: Three times a day (TID) | INTRAMUSCULAR | Status: DC
  Filled 2012-01-14 (×3): qty 1

## 2012-01-14 MED ORDER — LACTATED RINGERS IV SOLN
INTRAVENOUS | Status: DC | PRN
  Administered 2012-01-14 (×3): via INTRAVENOUS

## 2012-01-14 MED ORDER — ONDANSETRON HCL 4 MG/2ML IJ SOLN
4.0000 mg | Freq: Four times a day (QID) | INTRAMUSCULAR | Status: DC | PRN
  Administered 2012-01-19 (×2): 4 mg via INTRAVENOUS
  Filled 2012-01-14: qty 2

## 2012-01-14 MED ORDER — NALOXONE HCL 0.4 MG/ML IJ SOLN
0.4000 mg | INTRAMUSCULAR | Status: DC | PRN
  Administered 2012-01-14: 0.4 mg via INTRAVENOUS
  Filled 2012-01-14: qty 1

## 2012-01-14 MED ORDER — LIDOCAINE HCL 4 % MT SOLN
OROMUCOSAL | Status: DC | PRN
  Administered 2012-01-14: 4 mL via TOPICAL

## 2012-01-14 MED ORDER — 0.9 % SODIUM CHLORIDE (POUR BTL) OPTIME: TOPICAL | Status: DC | PRN

## 2012-01-14 MED ORDER — NEOSTIGMINE METHYLSULFATE 1 MG/ML IJ SOLN
INTRAMUSCULAR | Status: DC | PRN
  Administered 2012-01-14: 4 mg via INTRAVENOUS

## 2012-01-14 MED ORDER — ONDANSETRON HCL 4 MG/2ML IJ SOLN: 4.0000 mg | Freq: Four times a day (QID) | INTRAMUSCULAR | Status: DC | PRN

## 2012-01-14 MED ORDER — MIDAZOLAM HCL 5 MG/5ML IJ SOLN
INTRAMUSCULAR | Status: DC | PRN
  Administered 2012-01-14: 1 mg via INTRAVENOUS

## 2012-01-14 MED ORDER — SODIUM CHLORIDE 0.9 % IJ SOLN: 9.0000 mL | INTRAMUSCULAR | Status: DC | PRN

## 2012-01-14 MED ORDER — MUPIROCIN 2 % EX OINT
TOPICAL_OINTMENT | Freq: Once | CUTANEOUS | Status: AC
  Administered 2012-01-14: 09:00:00 via NASAL
  Filled 2012-01-14: qty 22

## 2012-01-14 MED ORDER — KCL IN DEXTROSE-NACL 20-5-0.9 MEQ/L-%-% IV SOLN
INTRAVENOUS | Status: DC
  Administered 2012-01-14: 16:00:00 via INTRAVENOUS
  Filled 2012-01-14 (×5): qty 1000

## 2012-01-14 MED ORDER — PIPERACILLIN-TAZOBACTAM 3.375 G IVPB
3.3750 g | Freq: Three times a day (TID) | INTRAVENOUS | Status: DC
  Administered 2012-01-14 – 2012-01-22 (×23): 3.375 g via INTRAVENOUS
  Filled 2012-01-14 (×28): qty 50

## 2012-01-14 MED ORDER — ONDANSETRON HCL 4 MG/2ML IJ SOLN: 4.0000 mg | Freq: Once | INTRAMUSCULAR | Status: DC | PRN

## 2012-01-14 MED ORDER — PROPOFOL 10 MG/ML IV EMUL
INTRAVENOUS | Status: DC | PRN
  Administered 2012-01-14: 200 mg via INTRAVENOUS

## 2012-01-14 MED ORDER — DEXTROSE 5 % IV SOLN
2.0000 g | INTRAVENOUS | Status: AC
  Administered 2012-01-14: 2 g via INTRAVENOUS
  Filled 2012-01-14: qty 2

## 2012-01-14 MED ORDER — ONDANSETRON HCL 4 MG PO TABS: 4.0000 mg | ORAL_TABLET | Freq: Four times a day (QID) | ORAL | Status: DC | PRN

## 2012-01-14 MED ORDER — GLYCOPYRROLATE 0.2 MG/ML IJ SOLN
INTRAMUSCULAR | Status: DC | PRN
  Administered 2012-01-14: .6 mg via INTRAVENOUS

## 2012-01-14 MED ORDER — FENTANYL CITRATE 0.05 MG/ML IJ SOLN
INTRAMUSCULAR | Status: DC | PRN
  Administered 2012-01-14 (×4): 50 ug via INTRAVENOUS
  Administered 2012-01-14 (×2): 100 ug via INTRAVENOUS
  Administered 2012-01-14 (×2): 50 ug via INTRAVENOUS

## 2012-01-14 MED ORDER — ONDANSETRON HCL 4 MG/2ML IJ SOLN
INTRAMUSCULAR | Status: DC | PRN
  Administered 2012-01-14 (×2): 4 mg via INTRAVENOUS

## 2012-01-14 MED ORDER — ROCURONIUM BROMIDE 100 MG/10ML IV SOLN
INTRAVENOUS | Status: DC | PRN
  Administered 2012-01-14: 10 mg via INTRAVENOUS
  Administered 2012-01-14: 50 mg via INTRAVENOUS
  Administered 2012-01-14: 10 mg via INTRAVENOUS

## 2012-01-14 MED ORDER — LIDOCAINE HCL (CARDIAC) 20 MG/ML IV SOLN
INTRAVENOUS | Status: DC | PRN
  Administered 2012-01-14: 60 mg via INTRAVENOUS

## 2012-01-14 MED ORDER — PROPRANOLOL HCL 1 MG/ML IV SOLN: 1.0000 mg | Freq: Once | INTRAVENOUS | Status: DC

## 2012-01-14 MED ORDER — POTASSIUM CHLORIDE 10 MEQ/100ML IV SOLN
10.0000 meq | INTRAVENOUS | Status: DC
  Administered 2012-01-14: 10 meq via INTRAVENOUS
  Filled 2012-01-14 (×4): qty 100

## 2012-01-14 MED ORDER — 0.9 % SODIUM CHLORIDE (POUR BTL) OPTIME
TOPICAL | Status: DC | PRN
  Administered 2012-01-14: 7000 mL

## 2012-01-14 MED ORDER — HYDROMORPHONE HCL PF 1 MG/ML IJ SOLN
INTRAMUSCULAR | Status: AC
  Filled 2012-01-14: qty 1

## 2012-01-14 SURGICAL SUPPLY — 50 items
BANDAGE GAUZE ELAST BULKY 4 IN (GAUZE/BANDAGES/DRESSINGS) ×2 IMPLANT
BRR ADH 5X3 SEPRAFILM 6 SHT (MISCELLANEOUS) ×2
CANISTER SUCTION 2500CC (MISCELLANEOUS) ×8 IMPLANT
CHLORAPREP W/TINT 26ML (MISCELLANEOUS) ×2 IMPLANT
CLOTH BEACON ORANGE TIMEOUT ST (SAFETY) ×2 IMPLANT
COVER SURGICAL LIGHT HANDLE (MISCELLANEOUS) ×2 IMPLANT
DRAPE LAPAROSCOPIC ABDOMINAL (DRAPES) ×2 IMPLANT
DRAPE PROXIMA HALF (DRAPES) ×2 IMPLANT
DRAPE UTILITY 15X26 W/TAPE STR (DRAPE) ×4 IMPLANT
DRAPE WARM FLUID 44X44 (DRAPE) ×2 IMPLANT
ELECT BLADE 6.5 EXT (BLADE) ×2 IMPLANT
ELECT REM PT RETURN 9FT ADLT (ELECTROSURGICAL) ×3
ELECTRODE REM PT RTRN 9FT ADLT (ELECTROSURGICAL) ×1 IMPLANT
GLOVE BIO SURGEON STRL SZ 6.5 (GLOVE) ×2 IMPLANT
GLOVE BIO SURGEON STRL SZ7.5 (GLOVE) ×6 IMPLANT
GLOVE BIO SURGEON STRL SZ8 (GLOVE) ×2 IMPLANT
GLOVE BIOGEL PI IND STRL 7.0 (GLOVE) ×1 IMPLANT
GLOVE BIOGEL PI IND STRL 7.5 (GLOVE) ×4 IMPLANT
GLOVE BIOGEL PI IND STRL 8 (GLOVE) ×3 IMPLANT
GLOVE BIOGEL PI INDICATOR 7.0 (GLOVE) ×1
GLOVE BIOGEL PI INDICATOR 7.5 (GLOVE) ×4
GLOVE BIOGEL PI INDICATOR 8 (GLOVE) ×3
GLOVE ECLIPSE 7.5 STRL STRAW (GLOVE) ×4 IMPLANT
GLOVE ECLIPSE 8.0 STRL XLNG CF (GLOVE) ×8 IMPLANT
GOWN EXTRA PROTECTION XL (GOWNS) ×2 IMPLANT
GOWN STRL NON-REIN LRG LVL3 (GOWN DISPOSABLE) ×12 IMPLANT
KIT BASIN OR (CUSTOM PROCEDURE TRAY) ×2 IMPLANT
KIT OSTOMY DRAINABLE 2.75 STR (WOUND CARE) ×2 IMPLANT
KIT ROOM TURNOVER OR (KITS) ×2 IMPLANT
LIGASURE IMPACT 36 18CM CVD LR (INSTRUMENTS) ×2 IMPLANT
NS IRRIG 1000ML POUR BTL (IV SOLUTION) ×14 IMPLANT
PACK GENERAL/GYN (CUSTOM PROCEDURE TRAY) ×2 IMPLANT
PAD ARMBOARD 7.5X6 YLW CONV (MISCELLANEOUS) ×2 IMPLANT
RELOAD PROXIMATE 75MM BLUE (ENDOMECHANICALS) ×3 IMPLANT
RELOAD STAPLE 75 3.8 BLU REG (ENDOMECHANICALS) IMPLANT
SEPRAFILM PROCEDURAL PACK 3X5 (MISCELLANEOUS) ×2 IMPLANT
SPONGE GAUZE 4X4 12PLY (GAUZE/BANDAGES/DRESSINGS) ×2 IMPLANT
SPONGE LAP 18X18 X RAY DECT (DISPOSABLE) ×6 IMPLANT
STAPLER PROXIMATE 75MM BLUE (STAPLE) ×2 IMPLANT
STAPLER VISISTAT 35W (STAPLE) ×2 IMPLANT
SUCTION POOLE TIP (SUCTIONS) ×2 IMPLANT
SUT PDS AB 1 TP1 96 (SUTURE) ×4 IMPLANT
SUT VIC AB 2-0 SH 18 (SUTURE) ×2 IMPLANT
SUT VIC AB 3-0 SH 18 (SUTURE) ×4 IMPLANT
SUT VICRYL AB 2 0 TIES (SUTURE) ×2 IMPLANT
TAPE CLOTH SURG 4X10 WHT LF (GAUZE/BANDAGES/DRESSINGS) ×2 IMPLANT
TOWEL OR 17X24 6PK STRL BLUE (TOWEL DISPOSABLE) ×2 IMPLANT
TOWEL OR 17X26 10 PK STRL BLUE (TOWEL DISPOSABLE) ×2 IMPLANT
TRAY FOLEY CATH 14FRSI W/METER (CATHETERS) ×2 IMPLANT
YANKAUER SUCT BULB TIP NO VENT (SUCTIONS) ×2 IMPLANT

## 2012-01-14 NOTE — Anesthesia Preprocedure Evaluation (Addendum)
Anesthesia Evaluation  Patient identified by MRN, date of birth, ID band Patient awake    Reviewed: Allergy & Precautions, H&P , NPO status , Patient's Chart, lab work & pertinent test results  Airway Mallampati: I TM Distance: >3 FB Neck ROM: full    Dental  (+) Teeth Intact   Pulmonary neg pulmonary ROS,    Pulmonary exam normal       Cardiovascular hypertension, Pt. on medications regular Normal    Neuro/Psych Negative Neurological ROS  Negative Psych ROS   GI/Hepatic Neg liver ROS,   Endo/Other  Negative Endocrine ROS  Renal/GU negative Renal ROS  Genitourinary negative   Musculoskeletal   Abdominal   Peds  Hematology negative hematology ROS (+)   Anesthesia Other Findings   Reproductive/Obstetrics                          Anesthesia Physical Anesthesia Plan  ASA: III  Anesthesia Plan: General, General ETT, Modified Rapid Sequence and Cricoid Pressure   Post-op Pain Management:    Induction: Intravenous  Airway Management Planned: Oral ETT  Additional Equipment:   Intra-op Plan:   Post-operative Plan: Extubation in OR  Informed Consent: I have reviewed the patients History and Physical, chart, labs and discussed the procedure including the risks, benefits and alternatives for the proposed anesthesia with the patient or authorized representative who has indicated his/her understanding and acceptance.   Dental advisory given  Plan Discussed with: CRNA, Anesthesiologist and Surgeon  Anesthesia Plan Comments:        Anesthesia Quick Evaluation

## 2012-01-14 NOTE — Anesthesia Postprocedure Evaluation (Signed)
  Anesthesia Post-op Note  Patient: Mallory Morales  Procedure(s) Performed:  COLON RESECTION - left colectomy, mobilization of splenic flexure, ostomy.  Patient Location: PACU  Anesthesia Type: General  Level of Consciousness: awake, alert , oriented and patient cooperative  Airway and Oxygen Therapy: Patient Spontanous Breathing and Patient connected to nasal cannula oxygen  Post-op Pain: mild  Post-op Assessment: Post-op Vital signs reviewed, Patient's Cardiovascular Status Stable, Respiratory Function Stable, Patent Airway, No signs of Nausea or vomiting and Pain level controlled  Post-op Vital Signs: stable  Complications: No apparent anesthesia complications

## 2012-01-14 NOTE — Addendum Note (Signed)
Addendum  created 01/14/12 1403 by Rivka Barbara, MD   Modules edited:Notes Section, Orders

## 2012-01-14 NOTE — Progress Notes (Signed)
Some relief with ng tube.  Stoma sites marked.  For partial colectomy now.

## 2012-01-14 NOTE — Anesthesia Postprocedure Evaluation (Signed)
  Anesthesia Post-op Note  Patient: Mallory Morales  Procedure(s) Performed:  COLON RESECTION - left colectomy, mobilization of splenic flexure, ostomy.  Patient Location: PACU  Anesthesia Type: General  Level of Consciousness: awake, oriented and patient cooperative  Airway and Oxygen Therapy: Patient Spontanous Breathing and Patient connected to nasal cannula oxygen  Post-op Pain: mild  Post-op Assessment: Post-op Vital signs reviewed, Patient's Cardiovascular Status Stable, Respiratory Function Stable, Patent Airway, No signs of Nausea or vomiting and Pain level controlled  Post-op Vital Signs: stable  Complications: No apparent anesthesia complications

## 2012-01-14 NOTE — Transfer of Care (Signed)
Immediate Anesthesia Transfer of Care Note  Patient: Mallory Morales  Procedure(s) Performed:  COLON RESECTION - left colectomy, mobilization of splenic flexure, ostomy.  Patient Location: PACU  Anesthesia Type: General  Level of Consciousness: awake and alert   Airway & Oxygen Therapy: Patient Spontanous Breathing and Patient connected to face mask oxygen  Post-op Assessment: Report given to PACU RN  Post vital signs: Reviewed and stable  Complications: No apparent anesthesia complications

## 2012-01-14 NOTE — Preoperative (Signed)
Beta Blockers   Reason not to administer Beta Blockers:Not Applicable 

## 2012-01-14 NOTE — Progress Notes (Signed)
Patient ID: Mallory Morales, female   DOB: [birth year], 56 y.o.   MRN: [id number] Subjective: No acute events.  She does feel better with the NG tube, but she still has abdominal pain.  Objective: Vital signs in last 24 hours: Temp:  [98 F (36.7 C)-99.5 F (37.5 C)] 99.5 F (37.5 C) (01/22 2135) Pulse Rate:  [101-104] 104  (01/22 2135) Resp:  [18] 18  (01/22 2135) BP: (128-136)/(85-87) 128/87 mmHg (01/22 2135) SpO2:  [95 %] 95 % (01/22 2135) Weight:  [72.848 kg (160 lb 9.6 oz)] 72.848 kg (160 lb 9.6 oz) (01/22 1349) Last BM Date: 01/09/12  Intake/Output from previous day: 01/22 0701 - 01/23 0700 In: 480 [P.O.:480] Out: 751 [Emesis/NG output:750; Stool:1] Intake/Output this shift:    General appearance: alert and mild distress GI: mildly distended, soft, mild tenderness to palpation  Lab Results:  [facility] 01/14/12 0619 01/13/12 1427  WBC 7.7 6.4  HGB 12.5 12.2  HCT 36.4 34.3*  PLT 240 216   BMET  Basename 01/14/12 0619 01/13/12 1427  NA 141 141  K 3.2* 2.6*  CL 99 100  CO2 24 29  GLUCOSE 81 91  BUN 8 8  CREATININE 0.71 0.70  CALCIUM 9.1 9.1   LFT  Basename 01/14/12 0619  PROT 6.4  ALBUMIN 3.2*  AST 13  ALT 8  ALKPHOS 61  BILITOT 0.4  BILIDIR --  IBILI --   PT/INR  Basename 01/14/12 0619  LABPROT 15.2  INR 1.18   Hepatitis Panel No results found for this basename: HEPBSAG,HCVAB,HEPAIGM,HEPBIGM in the last 72 hours C-Diff No results found for this basename: CDIFFTOX:3 in the last 72 hours Fecal Lactopherrin No results found for this basename: FECLLACTOFRN in the last 72 hours  Studies/Results: Dg Chest 2 View  01/13/2012  *RADIOLOGY REPORT*  Clinical Data: Colon tumor  CHEST - 2 VIEW  Comparison: None  Findings: Nasogastric tube tip is below the GE junction.  Heart size is normal.  No pleural effusion or pulmonary interstitial edema identified.  There is no airspace consolidation identified.  Review of the visualized osseous structures is  negative.  IMPRESSION:  1.  No acute cardiopulmonary abnormalities.  Original Report Authenticated By: Rosealee Albee, M.D.   Dg Abd 1 View  01/13/2012  *RADIOLOGY REPORT*  Clinical Data: Abdominal pain  ABDOMEN - 1 VIEW  Comparison: 01/02/2012  Findings: Mild colonic distention across the right side of the abdomen is present.  No obvious free intraperitoneal gas.  Gas filled nondilated loops of small bowel in the right upper quadrant. Bony framework is unremarkable. Decompressed sigmoid colon is noted.  IMPRESSION: Distention of the right and transverse colon. Earlier partial distal colonic obstruction may be present.  Original Report Authenticated By: Donavan Burnet, M.D.    Medications:  Scheduled:   . cefOXitin  2 g Intravenous 60 min Pre-Op  . potassium chloride  10 mEq Intravenous Q1 Hr x 4  . potassium chloride  10 mEq Intravenous Q1 Hr x 4  . sodium phosphate  1 enema Rectal Once  . DISCONTD: hydrochlorothiazide  25 mg Oral Daily  . DISCONTD: magnesium citrate  1 Bottle Oral Once   Continuous:   . 0.45 % NaCl with KCl 20 mEq / L 100 mL/hr at 01/13/12 2030  . DISCONTD: sodium chloride 125 mL/hr at 01/13/12 1409    Assessment/Plan: 1) Obstructing proximal sigmoid lesion, presumably a colon cancer. 2) Hypokalemia. 3) HTN.   Appreciate Dr. Maris Berger assistance.  She is to have  surgical resection today.  Plan: 1) Surgery per Dr. Abbey Chatters. 2) Replete K+ with another 40 mEq IV. 3) Continue with HCTZ.  LOS: 1 day   Ravenna Legore D 01/14/2012, 8:47 AM

## 2012-01-14 NOTE — Consult Note (Addendum)
WOC consult Note Reason for Consult: Mallory Morales placed on patient for possible ileostomy or colostomy stoma site.  Mark placed within rectus muscles, in line of vision, in area free from folds.  Assessed patient while lying and sitting. Mark placed 3 cm below umbilicus and 2 cm to to left, and 3 cm below umbilicus and 2 cm to right  Briefly discussed pouching routines and educational materials left in room.  Will follow patient post-op if she receives a stoma for ostomy teaching.   Cammie Mcgee, RN, MSN, [employer]  [phone]

## 2012-01-14 NOTE — Progress Notes (Signed)
Patient arrived from PACU very sedated. Initial vital signs stable with the exception of a respiratory rate of 5-8. Gave narcan as ordered and notified Dr. Abbey Chatters. Patient awake, alert, oriented, coughs on command, able to purposefully move limbs. Sedation scale of 5. Continuous pulse ox on, 100% on 2L nasal cannula.  Will continue to monitor. [place], [employer]

## 2012-01-14 NOTE — Op Note (Signed)
Operative Note  Mallory Morales female 56 y.o. 01/14/2012  PREOPERATIVE DX:  Obstructing left colon mass  POSTOPERATIVE DX:  Same  PROCEDURE:  Exploratory laparotomy, left colectomy with mobilization of splenic flexure, colostomy.         Surgeon: Adolph Pollack   Assistants: Jimmye Norman  Anesthesia: General endotracheal anesthesia  Indications: This is a 56 year old with progressive constipation and abdominal discomfort since July 2012. She underwent an attempted colonoscopy by Dr. Elnoria Howard and he could not get past the lesion in the distal descending colon area. CT scan demonstrated this lesion as well but no evidence of metastatic disease. She has been obstipated for the past week. She has proximal colonic dilatation. She is brought to the operating room for the above procedure.    Procedure Detail:  She was seen in the holding area brought to the operating room placed supine on the operating table and general anesthetic was administered. A Foley catheter was inserted. The abdominal wall was widely sterilely prepped and draped.  A long midline incision was made re\re excising central scar and carrying this down to the fascia and peritoneum. There were omental adhesions to the posterior abdominal wall that were divided with electrocautery. There was some serous ascites present. There were no omental implants. The transverse colon and right colon were distended. The distal small bowel was not distended. The lesion was palpable at the level of the anterior superior iliac line in the distal descending colon. The sigmoid colon was somewhat redundant. The transverse colon was also redundant.  I began mobilizing the left colon sigmoid colon by dividing the lateral attachments. The left ureter was identified and a plane of dissection kept anterior to this. I then mobilize the splenic flexure using electrocautery and the LigaSure. I dissected the omentum off the transverse colon. The lesion in  the left colon was very firm and large and obstructing. No ischemic colon was seen. No liver lesions were noted.  I divided the colon distal to the lesion at the mid sigmoid level with the GIA stapler. Mesentery was then divided with the LigaSure. I then divided the mid transverse colon and attempted to evacuate some of the air and stool for quite a bit of this leaked out contaminating the wound. This would not allow for primary anastomosis. I stapled off at open end with GIA stapler.  I then copiously irrigated out the abdominal cavity with the contamination occurred and we changed our gloves. The small bowel was run and no lesions were noted. The spleen was intact without evidence of bleeding. The specimen was handed off the field with the open end being the proximal. It was sent to pathology.  She been previously marked in the left lower quadrant for a stoma. Made a circular incision in the skin at this site and carried this down to the fascia were cruciate incision was made and dilated to 2-3 fingerbreadths. The transverse colon stump was then brought up through this wound it was anchored to the anterior fascia with interrupted 2-0 Vicryl sutures.  In Pl., Seprafilm on intestines omentum and closed the mid close the midline fascia with a running #1 PDS suture. Needle sponge and instrument counts reportedly correct. I packed the wound open with saline moistened gauze.  The colostomy was then matured with interrupted 3-0 Vicryl sutures and an appliance placed. A sterile dry dressing was then placed over the midline wound. She tolerated the procedure well without any apparent complications and was taken to recovery room  extubated in satisfactory condition.   Estimated Blood Loss:  300 mL         Drains: none          Blood Given: none          Specimens: left colon        Complications:  * No complications entered in OR log *         Disposition: PACU - hemodynamically stable.           Condition: stable

## 2012-01-15 ENCOUNTER — Inpatient Hospital Stay (HOSPITAL_COMMUNITY): Payer: 59

## 2012-01-15 ENCOUNTER — Encounter (HOSPITAL_COMMUNITY): Payer: Self-pay | Admitting: General Surgery

## 2012-01-15 DIAGNOSIS — N289 Disorder of kidney and ureter, unspecified: Secondary | ICD-10-CM

## 2012-01-15 DIAGNOSIS — E785 Hyperlipidemia, unspecified: Secondary | ICD-10-CM | POA: Diagnosis present

## 2012-01-15 DIAGNOSIS — I10 Essential (primary) hypertension: Secondary | ICD-10-CM | POA: Diagnosis present

## 2012-01-15 DIAGNOSIS — K6389 Other specified diseases of intestine: Secondary | ICD-10-CM | POA: Diagnosis present

## 2012-01-15 LAB — BASIC METABOLIC PANEL
CO2: 22 mEq/L (ref 19–32)
CO2: 24 mEq/L (ref 19–32)
GFR calc non Af Amer: 22 mL/min — ABNORMAL LOW (ref >90)
Glucose, Bld: 129 mg/dL — ABNORMAL HIGH (ref 70–99)
Glucose, Bld: 238 mg/dL — ABNORMAL HIGH (ref 70–99)
Potassium: 3.9 mEq/L (ref 3.5–5.1)
Potassium: 3.6 mEq/L (ref 3.5–5.1)
Sodium: 140 mEq/L (ref 135–145)
Sodium: 138 mEq/L (ref 135–145)

## 2012-01-15 LAB — CBC
Hemoglobin: 13.8 g/dL (ref 12.0–15.0)
RBC: 4.66 MIL/uL (ref 3.87–5.11)

## 2012-01-15 LAB — MAGNESIUM: Magnesium: 1.7 mg/dL (ref 1.5–2.5)

## 2012-01-15 MED ORDER — HEPARIN SODIUM (PORCINE) 5000 UNIT/ML IJ SOLN
5000.0000 [IU] | Freq: Three times a day (TID) | INTRAMUSCULAR | Status: DC
Start: 2012-01-15 — End: 2012-01-16
  Administered 2012-01-15 – 2012-01-16 (×4): 5000 [IU] via SUBCUTANEOUS
  Filled 2012-01-15 (×6): qty 1

## 2012-01-15 MED ORDER — DEXTROSE IN LACTATED RINGERS 5 % IV SOLN
INTRAVENOUS | Status: DC
  Administered 2012-01-15 – 2012-01-16 (×5): via INTRAVENOUS

## 2012-01-15 MED ORDER — LACTATED RINGERS IV BOLUS (SEPSIS)
1000.0000 mL | Freq: Once | INTRAVENOUS | Status: AC
  Administered 2012-01-15: 1000 mL via INTRAVENOUS

## 2012-01-15 MED ORDER — SODIUM CHLORIDE 0.9 % IV BOLUS (SEPSIS): 1000.0000 mL | Freq: Once | INTRAVENOUS | Status: DC

## 2012-01-15 MED ORDER — SODIUM CHLORIDE 0.9 % IV SOLN: INTRAVENOUS | Status: DC

## 2012-01-15 MED ORDER — SODIUM CHLORIDE 0.9 % IV BOLUS (SEPSIS)
1000.0000 mL | Freq: Once | INTRAVENOUS | Status: DC
  Administered 2012-01-15: 1000 mL via INTRAVENOUS

## 2012-01-15 MED ORDER — LACTATED RINGERS IV BOLUS (SEPSIS)
1000.0000 mL | Freq: Once | INTRAVENOUS | Status: AC
  Administered 2012-01-16: 1000 mL via INTRAVENOUS

## 2012-01-15 MED FILL — Hydromorphone HCl Inj 1 MG/ML: INTRAMUSCULAR | Qty: 1 | Status: AC

## 2012-01-15 NOTE — Consult Note (Signed)
WOC ostomy consult  Stoma type/location:  Pt received colostomy on 1/23 Stomal assessment/size:  Stoma visualized through pouch, appears to be 11/2 inches, red and viable, above skin level. Output  Small pink drainage, no stool or flatus at present. Ostomy pouching: 2pc intact with good seal. Education provided: Pt with PCA and NG.  Will begin pouching educational sessions when feeling better.  Supplies ordered to bedside for staff use.  Cammie Mcgee, RN, MSN, [employer]  [phone]

## 2012-01-15 NOTE — Progress Notes (Signed)
Changed the patient's PCA syringe. The syringe had a remainder of 0.3 ml of morphine left in the syringe. The remainder of the morphine was wasted in the nursing station sink and witnessed by Avie Echevaria, RN.

## 2012-01-15 NOTE — Progress Notes (Signed)
Patient seen and examined.  She has prerenal azotemia from hypovolemia.  Will continue aggressive volume resuscitation.

## 2012-01-15 NOTE — Progress Notes (Signed)
Patient ID: Mallory Morales, female   DOB: [birth year], 56 y.o.   MRN: [id number] Subjective: No acute events.  The patient reports feeling much better.  No further distension sensation.  Objective: Vital signs in last 24 hours: Temp:  [97.4 F (36.3 C)-99.7 F (37.6 C)] 97.4 F (36.3 C) (01/24 1115) Pulse Rate:  [104-139] 139  (01/24 1115) Resp:  [6-22] 18  (01/24 1115) BP: (95-156)/(68-102) 109/74 mmHg (01/24 1115) SpO2:  [97 %-100 %] 100 % (01/24 1115) Last BM Date: 01/13/12  Intake/Output from previous day: 01/23 0701 - 01/24 0700 In: 4812.5 [I.V.:4737.5; NG/GT:25; IV Piggyback:50] Out: 2325 [Urine:205; Emesis/NG output:420; Stool:1500; Blood:200] Intake/Output this shift: Total I/O In: 480 [P.O.:480] Out: -   General appearance: alert and no distress GI: tender at the incision site  Lab Results:  Basename 01/15/12 0611 01/14/12 1511 01/14/12 0619  WBC 13.8* 6.2 7.7  HGB 13.8 14.0 12.5  HCT 40.2 40.5 36.4  PLT 171 310 240   BMET  Basename 01/15/12 0611 01/14/12 1511 01/14/12 0855 01/14/12 0619  NA 140 -- 140 141  K 3.9 -- 2.8* 3.2*  CL 101 -- 101 99  CO2 22 -- 22 24  GLUCOSE 129* -- 77 81  BUN 23 -- 8 8  CREATININE 3.08* 1.11* 0.63 --  CALCIUM 8.6 -- 8.9 9.1   LFT  Basename 01/14/12 0619  PROT 6.4  ALBUMIN 3.2*  AST 13  ALT 8  ALKPHOS 61  BILITOT 0.4  BILIDIR --  IBILI --   PT/INR  Basename 01/14/12 0619  LABPROT 15.2  INR 1.18   Hepatitis Panel No results found for this basename: HEPBSAG,HCVAB,HEPAIGM,HEPBIGM in the last 72 hours C-Diff No results found for this basename: CDIFFTOX:3 in the last 72 hours Fecal Lactopherrin No results found for this basename: FECLLACTOFRN in the last 72 hours  Studies/Results: Dg Chest 2 View  01/13/2012  *RADIOLOGY REPORT*  Clinical Data: Colon tumor  CHEST - 2 VIEW  Comparison: None  Findings: Nasogastric tube tip is below the GE junction.  Heart size is normal.  No pleural effusion or pulmonary  interstitial edema identified.  There is no airspace consolidation identified.  Review of the visualized osseous structures is negative.  IMPRESSION:  1.  No acute cardiopulmonary abnormalities.  Original Report Authenticated By: Rosealee Albee, M.D.   Dg Abd 1 View  01/13/2012  *RADIOLOGY REPORT*  Clinical Data: Abdominal pain  ABDOMEN - 1 VIEW  Comparison: 01/02/2012  Findings: Mild colonic distention across the right side of the abdomen is present.  No obvious free intraperitoneal gas.  Gas filled nondilated loops of small bowel in the right upper quadrant. Bony framework is unremarkable. Decompressed sigmoid colon is noted.  IMPRESSION: Distention of the right and transverse colon. Earlier partial distal colonic obstruction may be present.  Original Report Authenticated By: Donavan Burnet, M.D.   US Renal  01/15/2012  *RADIOLOGY REPORT*  Clinical Data:  Acute renal insufficiency  RENAL/URINARY TRACT ULTRASOUND COMPLETE  Comparison:  01/02/2012  Findings: Diminished exam detail due to early postoperative state. Difficulty immobilizing patient for adequate the visualization of the organs.  Right Kidney:  Measures 9.4 cm.  Normal in size and parenchymal echogenicity.  No evidence of mass or hydronephrosis.  Left Kidney:  Measures 10.4 cm.  Normal in size and parenchymal echogenicity.  No evidence of mass or hydronephrosis.  Bladder:  Appears normal for degree of bladder distention.  Incidental note is made of fluid in the right upper quadrant of  the abdomen.  IMPRESSION: No evidence for obstructive uropathy.  Original Report Authenticated By: Rosealee Albee, M.D.    Medications:  Scheduled:   . heparin  5,000 Units Subcutaneous Q8H  . HYDROmorphone      . lactated ringers  1,000 mL Intravenous Once  . morphine   Intravenous Q4H  . piperacillin-tazobactam (ZOSYN)  IV  3.375 g Intravenous Q8H  . propranolol      . sodium chloride  1,000 mL Intravenous Once  . DISCONTD: heparin  5,000 Units  Subcutaneous Q8H  . DISCONTD: potassium chloride  10 mEq Intravenous Q1 Hr x 4  . DISCONTD: potassium chloride  10 mEq Intravenous Q1 Hr x 4  . DISCONTD: propranolol  1 mg Intravenous Once  . DISCONTD: sodium chloride  1,000 mL Intravenous Once   Continuous:   . dextrose 5% lactated ringers 200 mL/hr at 01/15/12 1054  . DISCONTD: 0.45 % NaCl with KCl 20 mEq / L 100 mL/hr at 01/13/12 2030  . DISCONTD: sodium chloride    . DISCONTD: dextrose 5 % and 0.9 % NaCl with KCl 20 mEq/L 150 mL/hr at 01/15/12 9629    Assessment/Plan: 1) Obstructive sigmoid colon mass s/p resection. 2) Acute renal failure. 3) HTN   The patient feels much better after the surgery.  I am appreciative of Dr. Maris Berger intervention.  She appears to have acute renal failure and this appears to be volume related, which I discussed with Dr. Abbey Chatters.  Her urine output is low, but the renal ultrasound appears to be normal, i.e., no hydronephrosis.    Plan: 1) Aggressive hydration per Dr. Abbey Chatters. 2) Transfer service to [facility]. 3) I will sign off at this time. Please call with any questions.  LOS: 2 days   Timmya Blazier D 01/15/2012, 12:15 PM

## 2012-01-15 NOTE — Progress Notes (Signed)
1 Day Post-Op  Subjective: Afebrile, HR up, BP in the 90's, She's really not complaining of anything except being thirsty.  Foley in place u/o recorded yestereday I am assuming from surgery.100 ml (1900-0700)      Objective: Vital signs in last 24 hours: Temp:  [97.5 F (36.4 C)-99.7 F (37.6 C)] 97.5 F (36.4 C) (01/24 0539) Pulse Rate:  [104-134] 134  (01/24 0539) Resp:  [6-22] 18  (01/24 0539) BP: (95-156)/(68-102) 95/69 mmHg (01/24 0539) SpO2:  [97 %-100 %] 100 % (01/24 0539) Last BM Date: 01/13/12  Intake/Output from previous day: 01/23 0701 - 01/24 0700 In: 4812.5 [I.V.:4737.5; NG/GT:25; IV Piggyback:50] Out: 2325 [Urine:205; Emesis/NG output:420; Stool:1500; Blood:200] Intake/Output this shift:   PE:  Alert and oriented, Chest: Clear Abd: dressing in place.  Ostomy shows some brownish-red drainage. Urine looks a bit cloudy.  Lab Results:   Basename 01/15/12 0611 01/14/12 1511  WBC 13.8* 6.2  HGB 13.8 14.0  HCT 40.2 40.5  PLT 171 310    BMET  Basename 01/15/12 0611 01/14/12 1511 01/14/12 0855  NA 140 -- 140  K PENDING -- 2.8*  CL 101 -- 101  CO2 22 -- 22  GLUCOSE 129* -- 77  BUN 23 -- 8  CREATININE 3.08* 1.11* --  CALCIUM 8.6 -- 8.9   PT/INR  Basename 01/14/12 0619  LABPROT 15.2  INR 1.18     Studies/Results: Dg Chest 2 View  01/13/2012  *RADIOLOGY REPORT*  Clinical Data: Colon tumor  CHEST - 2 VIEW  Comparison: None  Findings: Nasogastric tube tip is below the GE junction.  Heart size is normal.  No pleural effusion or pulmonary interstitial edema identified.  There is no airspace consolidation identified.  Review of the visualized osseous structures is negative.  IMPRESSION:  1.  No acute cardiopulmonary abnormalities.  Original Report Authenticated By: Rosealee Albee, M.D.   Dg Abd 1 View  01/13/2012  *RADIOLOGY REPORT*  Clinical Data: Abdominal pain  ABDOMEN - 1 VIEW  Comparison: 01/02/2012  Findings: Mild colonic distention across the  right side of the abdomen is present.  No obvious free intraperitoneal gas.  Gas filled nondilated loops of small bowel in the right upper quadrant. Bony framework is unremarkable. Decompressed sigmoid colon is noted.  IMPRESSION: Distention of the right and transverse colon. Earlier partial distal colonic obstruction may be present.  Original Report Authenticated By: Donavan Burnet, M.D.    Anti-infectives: Anti-infectives     Start     Dose/Rate Route Frequency Ordered Stop   01/14/12 1600   piperacillin-tazobactam (ZOSYN) IVPB 3.375 g        3.375 g 12.5 mL/hr over 240 Minutes Intravenous Every 8 hours 01/14/12 1434     01/14/12 0245   cefOXitin (MEFOXIN) 2 g in dextrose 5 % 50 mL IVPB        2 g 100 mL/hr over 30 Minutes Intravenous 60 min pre-op 01/14/12 0238 01/14/12 1020         Current Facility-Administered Medications  Medication Dose Route Frequency Provider Last Rate Last Dose  . cefOXitin (MEFOXIN) 2 g in dextrose 5 % 50 mL IVPB  2 g Intravenous 60 min Pre-Op Adolph Pollack, MD   2 g at 01/14/12 1020  . dextrose 5 % and 0.9 % NaCl with KCl 20 mEq/L infusion   Intravenous Continuous Adolph Pollack, MD 150 mL/hr at 01/15/12 0657    . diphenhydrAMINE (BENADRYL) injection 12.5 mg  12.5 mg Intravenous Q6H  PRN Adolph Pollack, MD       Or  . diphenhydrAMINE (BENADRYL) 12.5 MG/5ML elixir 12.5 mg  12.5 mg Oral Q6H PRN Adolph Pollack, MD      . heparin injection 5,000 Units  5,000 Units Subcutaneous Q8H Adolph Pollack, MD      . HYDROmorphone (DILAUDID) 1 MG/ML injection           . HYDROmorphone (DILAUDID) injection 0.25-0.5 mg  0.25-0.5 mg Intravenous Q5 min PRN Rivka Barbara, MD   0.5 mg at 01/14/12 1331  . menthol-cetylpyridinium (CEPACOL) lozenge 3 mg  1 lozenge Oral PRN Adolph Pollack, MD      . morphine 1 MG/ML PCA injection   Intravenous Q4H Adolph Pollack, MD   3 mg at 01/15/12 0409  . mupirocin ointment (BACTROBAN) 2 %   Nasal Once Theda Belfast,  MD      . naloxone [facility]) injection 0.4 mg  0.4 mg Intravenous PRN Adolph Pollack, MD   0.4 mg at 01/14/12 1444   And  . sodium chloride 0.9 % injection 9 mL  9 mL Intravenous PRN Adolph Pollack, MD      . ondansetron [facility]) tablet 4 mg  4 mg Oral Q6H PRN Adolph Pollack, MD       Or  . ondansetron [facility]) injection 4 mg  4 mg Intravenous Q6H PRN Adolph Pollack, MD      . piperacillin-tazobactam (ZOSYN) IVPB 3.375 g  3.375 g Intravenous Q8H Adolph Pollack, MD   3.375 g at 01/15/12 0052  . propranolol (INDERAL) 1 MG/ML injection        1 mg at 01/14/12 1412  . DISCONTD: 0.45 % NaCl with KCl 20 mEq / L infusion   Intravenous Continuous Sherrie George, PA 100 mL/hr at 01/13/12 2030    . DISCONTD: 0.9 % irrigation (POUR BTL)    PRN Adolph Pollack, MD      . DISCONTD: 0.9 % irrigation (POUR BTL)    PRN Adolph Pollack, MD   7,000 mL at 01/14/12 0924  . DISCONTD: morphine 2 MG/ML injection 2 mg  2 mg Intravenous Q3H PRN Theda Belfast, MD   2 mg at 01/14/12 1610  . DISCONTD: ondansetron (ZOFRAN) injection 4 mg  4 mg Intravenous Once PRN Rivka Barbara, MD      . DISCONTD: ondansetron [facility]) injection 4 mg  4 mg Intravenous Q6H PRN Adolph Pollack, MD      . DISCONTD: potassium chloride 10 mEq in 100 mL IVPB  10 mEq Intravenous Q1 Hr x 4 Theda Belfast, MD   10 mEq at 01/13/12 2347  . DISCONTD: potassium chloride 10 mEq in 100 mL IVPB  10 mEq Intravenous Q1 Hr x 4 Theda Belfast, MD   10 mEq at 01/14/12 0916  . DISCONTD: propranolol (INDERAL) injection 1 mg  1 mg Intravenous Once Rivka Barbara, MD       Facility-Administered Medications Ordered in Other Encounters  Medication Dose Route Frequency Provider Last Rate Last Dose  . DISCONTD: fentaNYL (SUBLIMAZE) injection    PRN [employer], CRNA   50 mcg at 01/14/12 1200  . DISCONTD: glycopyrrolate (ROBINUL) injection    PRN Joneen Caraway, CRNA   0.6 mg at 01/14/12 1215  . DISCONTD: lactated ringers  infusion    Continuous PRN [employer], CRNA      . DISCONTD: lidocaine (cardiac) 100 mg/6ml (XYLOCAINE)  20 MG/ML injection 2%    PRN [employer], CRNA   60 mg at 01/14/12 1016  . DISCONTD: Lidocaine HCl 4 % topical solution    PRN [employer], CRNA   4 mL at 01/14/12 1016  . DISCONTD: midazolam (VERSED) 5 MG/5ML injection    PRN [employer], CRNA   1 mg at 01/14/12 1013  . DISCONTD: neostigmine (PROSTIGMINE) injection   Intravenous PRN Joneen Caraway, CRNA   4 mg at 01/14/12 1215  . DISCONTD: ondansetron (ZOFRAN) injection    PRN [employer], CRNA   4 mg at 01/14/12 1145  . DISCONTD: propofol (DIPRIVAN) 10 MG/ML infusion    PRN [employer], CRNA   200 mg at 01/14/12 1016  . DISCONTD: rocuronium (ZEMURON) injection    PRN Kristopher Greig Right, CRNA   10 mg at 01/14/12 1109    Assessment/Plan  S/p Left colectomy with mobilization of splenic flexure, colostomy for colon mass with obstruction.POD1 Acute Renal Insuffiencey. Hypertension Hyperlipidemia   Plan: Fluid Bolus, Renal ultra sound, NPO, antibiotics 7 days.  Recheck labs at 1pm. Monitor U/O closely.     LOS: 2 days    Mallory Morales 01/15/2012

## 2012-01-15 NOTE — Plan of Care (Signed)
Problem: Phase I Progression Outcomes Goal: Pain controlled with appropriate interventions Outcome: Progressing Still Morphine PCA. Goal: Hemodynamically stable Outcome: Progressing Instructed pt. On incentive spirometer;cough and deep breathe.

## 2012-01-16 LAB — CBC
HCT: 29 % — ABNORMAL LOW (ref 36.0–46.0)
HCT: 29.4 % — ABNORMAL LOW (ref 36.0–46.0)
Hemoglobin: 10 g/dL — ABNORMAL LOW (ref 12.0–15.0)
MCHC: 34 g/dL (ref 30.0–36.0)
MCV: 85.3 fL (ref 78.0–100.0)
Platelets: 124 10*3/uL — ABNORMAL LOW (ref 150–400)
RBC: 3.4 MIL/uL — ABNORMAL LOW (ref 3.87–5.11)
RBC: 3.46 MIL/uL — ABNORMAL LOW (ref 3.87–5.11)
WBC: 11.8 10*3/uL — ABNORMAL HIGH (ref 4.0–10.5)
WBC: 13.1 10*3/uL — ABNORMAL HIGH (ref 4.0–10.5)

## 2012-01-16 LAB — COMPREHENSIVE METABOLIC PANEL
AST: 23 U/L (ref 0–37)
Alkaline Phosphatase: 54 U/L (ref 39–117)
CO2: 28 mEq/L (ref 19–32)
Chloride: 105 mEq/L (ref 96–112)
Creatinine, Ser: 1.09 mg/dL (ref 0.50–1.10)
GFR calc non Af Amer: 57 mL/min — ABNORMAL LOW (ref >90)
Potassium: 3.6 mEq/L (ref 3.5–5.1)
Total Bilirubin: 0.7 mg/dL (ref 0.3–1.2)

## 2012-01-16 MED ORDER — KCL IN DEXTROSE-NACL 20-5-0.9 MEQ/L-%-% IV SOLN
INTRAVENOUS | Status: DC
  Administered 2012-01-16 – 2012-01-17 (×4): via INTRAVENOUS
  Administered 2012-01-17: 125 mL via INTRAVENOUS
  Administered 2012-01-18: 04:00:00 via INTRAVENOUS
  Administered 2012-01-18: 100 mL/h via INTRAVENOUS
  Administered 2012-01-18 – 2012-01-26 (×7): via INTRAVENOUS
  Filled 2012-01-16 (×16): qty 1000

## 2012-01-16 MED ORDER — LACTATED RINGERS IV BOLUS (SEPSIS)
1000.0000 mL | Freq: Once | INTRAVENOUS | Status: AC
  Administered 2012-01-16: 1000 mL via INTRAVENOUS

## 2012-01-16 NOTE — Consult Note (Signed)
WOC consult Note Reason for Consult: Consult requested for abd full thickness wound, requested to apply VAC dressing to postop wound Measurement:12X2X2.5cm, tunnel to middle of wound is 2.5cm area. Wound bed: 80% red, 20% yellow adipose. Drainage (amount, consistency, odor)  Mod pink drainage, no odor. Periwound: Ostomy very close to abd vac site, will have to change both every time. Dressing procedure/placement/frequency: Applied one piece white foam to tunnel, one piece black foam to wound bed.  Cont suction on at 125cm.  Pt tolerated well with minimal c/o pain. Plan dressing change Monday.  WOC ostomy follow-up consult  Stoma type/location: Colostomy  Stomal assessment/size: Stoma is beyond limit of two piece barrier, and to edge of one piece barrier Peristomal assessment:  Red and swollen, 23/4 inches oval, above skin level.  Output  Small bloody drainage, no stool and flatus. Ostomy pouching: 1pc applied over vac dressing, unable to avoid close proximity..  Education provided:  Demonstrated pouch change to sister and patient at bedside.  Discussed pouching routines.  Educational materials left at bedside.  Cammie Mcgee, RN, MSN, [employer]  [phone]

## 2012-01-16 NOTE — Progress Notes (Signed)
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  This was discussed with her.  VAC was able to be placed and she is tolerating it well.  Hgb is down.  Will stop Heparin and recheck it.  Will give fluid bolus as well for borderline hypotension with tachycardia.

## 2012-01-16 NOTE — Progress Notes (Signed)
Patient seen and examined.  Has ABL anemia.  Wound looks good.  Hydration is better.  Will see if VAC can be placed on wound.  Continue IV Zosyn for 7 days total due to fecal contamination.

## 2012-01-16 NOTE — Plan of Care (Signed)
Problem: Phase I Progression Outcomes Goal: OOB as tolerated unless otherwise ordered Outcome: Progressing Stood at side of bed earlier today with staff and tolerated well.

## 2012-01-16 NOTE — Progress Notes (Signed)
2 Days Post-Op  Subjective: Pt feeling ok. RN and family say urine from foley all night. Pain control adequate. Belching and not hearing any flatus from ostomy.  Objective: Vital signs in last 24 hours: Temp:  [97.4 F (36.3 C)-99.2 F (37.3 C)] 98.6 F (37 C) (01/25 0525) Pulse Rate:  [121-139] 125  (01/25 0525) Resp:  [12-20] 18  (01/25 0525) BP: (101-110)/(65-74) 105/72 mmHg (01/25 0525) SpO2:  [95 %-100 %] 99 % (01/25 0525) Last BM Date: 01/13/12  Intake/Output this shift:    Physical Exam: BP 105/72  Pulse 125  Temp(Src) 98.6 F (37 C) (Oral)  Resp 18  Ht 5\' 5"  (1.651 m)  Wt 72.848 kg (160 lb 9.6 oz)  BMI 26.73 kg/m2  SpO2 99% ENT: NG in place, still somewhat bilious output in canister. Lungs: CTA Heart: Reg Abdomen: soft, appropriately tender. Ostomy viable, thin ss output in bag, no gas Wound clean, no pus  Labs: CBC  Basename 01/16/12 0612 01/15/12 0611  WBC 11.8* 13.8*  HGB 9.9* 13.8  HCT 29.0* 40.2  PLT 124* 171   BMET  Basename 01/16/12 0612 01/15/12 1445  NA 141 138  K 3.6 3.6  CL 105 104  CO2 28 24  GLUCOSE 223* 238*  BUN 20 28*  CREATININE 1.09 2.43*  CALCIUM 8.1* 8.1*   LFT  Basename 01/16/12 0612  PROT 4.5*  ALBUMIN 1.8*  AST 23  ALT 9  ALKPHOS 54  BILITOT 0.7  BILIDIR --  IBILI --  LIPASE --   PT/INR  Basename 01/14/12 0619  LABPROT 15.2  INR 1.18   ABG No results found for this basename: PHART:2,PCO2:2,PO2:2,HCO3:2 in the last 72 hours  Studies/Results: US Renal  01/15/2012  *RADIOLOGY REPORT*  Clinical Data:  Acute renal insufficiency  RENAL/URINARY TRACT ULTRASOUND COMPLETE  Comparison:  01/02/2012  Findings: Diminished exam detail due to early postoperative state. Difficulty immobilizing patient for adequate the visualization of the organs.  Right Kidney:  Measures 9.4 cm.  Normal in size and parenchymal echogenicity.  No evidence of mass or hydronephrosis.  Left Kidney:  Measures 10.4 cm.  Normal in size and  parenchymal echogenicity.  No evidence of mass or hydronephrosis.  Bladder:  Appears normal for degree of bladder distention.  Incidental note is made of fluid in the right upper quadrant of the abdomen.  IMPRESSION: No evidence for obstructive uropathy.  Original Report Authenticated By: Rosealee Albee, M.D.    Assessment: Active Problems: Sigmoid colon mass, s/p (L)colectomy with colostomy.  Hypertension  Dyslipidemia  Acute renal insufficiency, much improved   Plan: UOP and Creatinine much better. Encouraged IS Will get OOB to chair and walk today. COntinue Foley for strict UOP. Dressing change.  LOS: 3 days    Alyse Low 01/16/2012

## 2012-01-17 DIAGNOSIS — C186 Malignant neoplasm of descending colon: Secondary | ICD-10-CM | POA: Diagnosis present

## 2012-01-17 LAB — CBC
HCT: 28.7 % — ABNORMAL LOW (ref 36.0–46.0)
Hemoglobin: 9.8 g/dL — ABNORMAL LOW (ref 12.0–15.0)
MCH: 29.4 pg (ref 26.0–34.0)
MCHC: 34.1 g/dL (ref 30.0–36.0)
MCV: 86.2 fL (ref 78.0–100.0)
RBC: 3.33 MIL/uL — ABNORMAL LOW (ref 3.87–5.11)

## 2012-01-17 LAB — BASIC METABOLIC PANEL
BUN: 10 mg/dL (ref 6–23)
CO2: 31 mEq/L (ref 19–32)
Chloride: 109 mEq/L (ref 96–112)
GFR calc non Af Amer: >90 mL/min (ref >90)
Glucose, Bld: 178 mg/dL — ABNORMAL HIGH (ref 70–99)
Potassium: 4.3 mEq/L (ref 3.5–5.1)
Sodium: 144 mEq/L (ref 135–145)

## 2012-01-17 MED ORDER — METOPROLOL TARTRATE 1 MG/ML IV SOLN
5.0000 mg | Freq: Four times a day (QID) | INTRAVENOUS | Status: DC
  Administered 2012-01-17 – 2012-01-22 (×21): 5 mg via INTRAVENOUS
  Filled 2012-01-17 (×25): qty 5

## 2012-01-17 MED ORDER — PANTOPRAZOLE SODIUM 40 MG IV SOLR
40.0000 mg | INTRAVENOUS | Status: DC
  Administered 2012-01-17 – 2012-01-21 (×5): 40 mg via INTRAVENOUS
  Filled 2012-01-17 (×7): qty 40

## 2012-01-17 MED ORDER — HEPARIN SODIUM (PORCINE) 5000 UNIT/ML IJ SOLN
5000.0000 [IU] | Freq: Three times a day (TID) | INTRAMUSCULAR | Status: DC
  Administered 2012-01-17 – 2012-01-22 (×15): 5000 [IU] via SUBCUTANEOUS
  Filled 2012-01-17 (×19): qty 1

## 2012-01-17 NOTE — Progress Notes (Signed)
3 Days Post-Op  Subjective: Stable and overt. Urine output has picked up nicely. Still with sinus tachycardia. Says she passed a little flatus per ostomy. Pain control good. Has not ambulated yet.  Hemoglobin stable. BUN 10 creatinine 0.75.  Objective: Vital signs in last 24 hours: Temp:  [99 F (37.2 C)-100.1 F (37.8 C)] 99.8 F (37.7 C) (01/26 0510) Pulse Rate:  [125-144] 125  (01/26 0510) Resp:  [18-22] 20  (01/26 0510) BP: (93-126)/(65-83) 126/83 mmHg (01/26 0510) SpO2:  [95 %-100 %] 98 % (01/26 0510) Last BM Date: 01/13/12  Intake/Output from previous day: 01/25 0701 - 01/26 0700 In: 2723.8 [I.V.:2220.8; NG/GT:350; IV Piggyback:150] Out: 1075 [Urine:775; Emesis/NG output:300] Intake/Output this shift:    General appearance: alert and appropriate. No acute distress. NG and Foley in place. Resp: clear to auscultation bilaterally. No wheezes or rhonchi. Slight decreased breath sounds at bases. GI: soft. Minimally, but appropriately server. Back in place. Colostomy left side pink and healthy. Only a little bit of serosanguineous fluid in bag. Extremities: Mild to moderate edema, diffuse Lab Results:  Results for orders placed during the hospital encounter of 01/13/12 (from the past 24 hour(s))  CBC     Status: Abnormal   Collection Time   01/16/12  2:16 PM      Component Value Range   WBC 13.1 (*) 4.0 - 10.5 (K/uL)   RBC 3.46 (*) 3.87 - 5.11 (MIL/uL)   Hemoglobin 10.0 (*) 12.0 - 15.0 (g/dL)   HCT 16.1 (*) 09.6 - 46.0 (%)   MCV 85.0  78.0 - 100.0 (fL)   MCH 28.9  26.0 - 34.0 (pg)   MCHC 34.0  30.0 - 36.0 (g/dL)   RDW 04.5  40.9 - 81.1 (%)   Platelets 126 (*) 150 - 400 (K/uL)  BASIC METABOLIC PANEL     Status: Abnormal   Collection Time   01/17/12  6:45 AM      Component Value Range   Sodium 144  135 - 145 (mEq/L)   Potassium 4.3  3.5 - 5.1 (mEq/L)   Chloride 109  96 - 112 (mEq/L)   CO2 31  19 - 32 (mEq/L)   Glucose, Bld 178 (*) 70 - 99 (mg/dL)   BUN 10  6 - 23  (mg/dL)   Creatinine, Ser 9.14  0.50 - 1.10 (mg/dL)   Calcium 7.9 (*) 8.4 - 10.5 (mg/dL)   GFR calc non Af Amer >90  >90 (mL/min)   GFR calc Af Amer >90  >90 (mL/min)  CBC     Status: Abnormal   Collection Time   01/17/12  6:45 AM      Component Value Range   WBC 13.4 (*) 4.0 - 10.5 (K/uL)   RBC 3.33 (*) 3.87 - 5.11 (MIL/uL)   Hemoglobin 9.8 (*) 12.0 - 15.0 (g/dL)   HCT 78.2 (*) 95.6 - 46.0 (%)   MCV 86.2  78.0 - 100.0 (fL)   MCH 29.4  26.0 - 34.0 (pg)   MCHC 34.1  30.0 - 36.0 (g/dL)   RDW 21.3  08.6 - 57.8 (%)   Platelets 123 (*) 150 - 400 (K/uL)     Studies/Results: @RISRSLT24 @     . heparin subcutaneous  5,000 Units Subcutaneous Q8H  . lactated ringers  1,000 mL Intravenous Once  . metoprolol  5 mg Intravenous Q6H  . morphine   Intravenous Q4H  . pantoprazole (PROTONIX) IV  40 mg Intravenous Q24H  . piperacillin-tazobactam (ZOSYN)  IV  3.375 g Intravenous  Q8H  . sodium chloride  1,000 mL Intravenous Once  . 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 She still has ileus. We'll continue NG tube for now. We'll mobilize and ambulate in halls today. Incentive  spirometry.  Regular tachycardia despite apparent adequate fluid resuscitation. We'll put her on prophylactic beta blockers.  Will start proton pump inhibitors for stress ulcer prophylaxis.  No evidence of bleeding so we'll restart pharmacologic DVT prophylaxis with heparin.  Check labs tomorrow.  Angelia Mould. Derrell Lolling, M.D., [facility] General and Minimally invasive Surgery Breast and Colorectal Surgery Office:   [phone] Pager:   [phone]    LOS: 4 days    Rache Klimaszewski M 01/17/2012  . .prob

## 2012-01-17 NOTE — Progress Notes (Signed)
Changed morphine syringe at 943, 2 mg wasted in sink at desk, witnessed by Dickie La. Macarthur Critchley, RN

## 2012-01-18 LAB — BASIC METABOLIC PANEL
BUN: 7 mg/dL (ref 6–23)
CO2: 29 mEq/L (ref 19–32)
Calcium: 8.1 mg/dL — ABNORMAL LOW (ref 8.4–10.5)
Chloride: 111 mEq/L (ref 96–112)
Creatinine, Ser: 0.7 mg/dL (ref 0.50–1.10)
GFR calc Af Amer: >90 mL/min (ref >90)

## 2012-01-18 LAB — CBC
HCT: 27.6 % — ABNORMAL LOW (ref 36.0–46.0)
MCH: 29.4 pg (ref 26.0–34.0)
MCV: 86.3 fL (ref 78.0–100.0)
Platelets: 143 10*3/uL — ABNORMAL LOW (ref 150–400)
RDW: 13.4 % (ref 11.5–15.5)
WBC: 9.7 10*3/uL (ref 4.0–10.5)

## 2012-01-18 MED ORDER — FUROSEMIDE 10 MG/ML IJ SOLN
20.0000 mg | Freq: Once | INTRAMUSCULAR | Status: AC
  Administered 2012-01-18: 20 mg via INTRAVENOUS
  Filled 2012-01-18: qty 2

## 2012-01-18 NOTE — Progress Notes (Signed)
4 Days Post-Op  Subjective: Stable and alert. Has not passed any flatus or stool in the last 24 hours. She is frustrated about this. She has been ambulating. Still has Foley catheter.urine output 775 cc per 24 hours. NG output 300 cc per 24-hour period.  WBC down to 9.7, hemoglobin 9.4, stable. Chemistries pending  Objective: Vital signs in last 24 hours: Temp:  [97.4 F (36.3 C)-99.5 F (37.5 C)] 99.5 F (37.5 C) (01/27 0509) Pulse Rate:  [106-119] 106  (01/27 0509) Resp:  [18-22] 18  (01/27 0509) BP: (117-134)/(81-88) 125/85 mmHg (01/27 0509) SpO2:  [94 %-100 %] 99 % (01/27 0509) Last BM Date: 01/13/12  Intake/Output from previous day: 01/26 0701 - 01/27 0700 In: -  Out: 275 [Urine:275] Intake/Output this shift: Total I/O In: -  Out: 275 [Urine:275]  General appearance: alert Resp: clear to auscultation bilaterally GI: soft.. Silent. Slightly distended. VAC in place. Appropriate  incisional tenderness. Colostomy healthy, pink, edematous. thin serosanguineous fluid in bag, no stool.  Lab Results:  Results for orders placed during the hospital encounter of 01/13/12 (from the past 24 hour(s))  BASIC METABOLIC PANEL     Status: Abnormal   Collection Time   01/17/12  6:45 AM      Component Value Range   Sodium 144  135 - 145 (mEq/L)   Potassium 4.3  3.5 - 5.1 (mEq/L)   Chloride 109  96 - 112 (mEq/L)   CO2 31  19 - 32 (mEq/L)   Glucose, Bld 178 (*) 70 - 99 (mg/dL)   BUN 10  6 - 23 (mg/dL)   Creatinine, Ser 1.61  0.50 - 1.10 (mg/dL)   Calcium 7.9 (*) 8.4 - 10.5 (mg/dL)   GFR calc non Af Amer >90  >90 (mL/min)   GFR calc Af Amer >90  >90 (mL/min)  CBC     Status: Abnormal   Collection Time   01/17/12  6:45 AM      Component Value Range   WBC 13.4 (*) 4.0 - 10.5 (K/uL)   RBC 3.33 (*) 3.87 - 5.11 (MIL/uL)   Hemoglobin 9.8 (*) 12.0 - 15.0 (g/dL)   HCT 09.6 (*) 04.5 - 46.0 (%)   MCV 86.2  78.0 - 100.0 (fL)   MCH 29.4  26.0 - 34.0 (pg)   MCHC 34.1  30.0 - 36.0 (g/dL)   RDW 40.9  81.1 - 91.4 (%)   Platelets 123 (*) 150 - 400 (K/uL)  CBC     Status: Abnormal   Collection Time   01/18/12  5:25 AM      Component Value Range   WBC 9.7  4.0 - 10.5 (K/uL)   RBC 3.20 (*) 3.87 - 5.11 (MIL/uL)   Hemoglobin 9.4 (*) 12.0 - 15.0 (g/dL)   HCT 78.2 (*) 95.6 - 46.0 (%)   MCV 86.3  78.0 - 100.0 (fL)   MCH 29.4  26.0 - 34.0 (pg)   MCHC 34.1  30.0 - 36.0 (g/dL)   RDW 21.3  08.6 - 57.8 (%)   Platelets 143 (*) 150 - 400 (K/uL)     Studies/Results: @RISRSLT24 @     . heparin subcutaneous  5,000 Units Subcutaneous Q8H  . metoprolol  5 mg Intravenous Q6H  . morphine   Intravenous Q4H  . pantoprazole (PROTONIX) IV  40 mg Intravenous Q24H  . piperacillin-tazobactam (ZOSYN)  IV  3.375 g Intravenous Q8H  . sodium chloride  1,000 mL Intravenous Once     Assessment/Plan: s/p Procedure(s): LEFT COLON  RESECTION WITH COLOSTOMY   Status post emergent left colectomy with colostomy for obstructing carcinoma, stage TIII N0.  Still has ileus. We'll continue NG tube for nail. Will discontinue Foley but continue strict intake and output record.  Suspect for place paced fluid retention with anasarca, will give one dose of Lasix today  Check lab work tomorrow  Regular tachycardia, now on beta blockers, improved    LOS: 5 days    Joya Willmott M. Derrell Lolling, M.D., [facility] General and Minimally invasive Surgery Breast and Colorectal Surgery Office:   [phone] Pager:   [phone]  01/18/2012  . .prob

## 2012-01-19 LAB — COMPREHENSIVE METABOLIC PANEL
AST: 34 U/L (ref 0–37)
Albumin: 1.7 g/dL — ABNORMAL LOW (ref 3.5–5.2)
BUN: 6 mg/dL (ref 6–23)
Creatinine, Ser: 0.67 mg/dL (ref 0.50–1.10)
Total Protein: 5 g/dL — ABNORMAL LOW (ref 6.0–8.3)

## 2012-01-19 LAB — CBC
HCT: 27.7 % — ABNORMAL LOW (ref 36.0–46.0)
MCHC: 33.9 g/dL (ref 30.0–36.0)
MCV: 85.8 fL (ref 78.0–100.0)
Platelets: 166 10*3/uL (ref 150–400)
RDW: 13.3 % (ref 11.5–15.5)

## 2012-01-19 MED ORDER — FUROSEMIDE 10 MG/ML IJ SOLN
40.0000 mg | Freq: Once | INTRAMUSCULAR | Status: AC
  Administered 2012-01-19: 40 mg via INTRAVENOUS
  Filled 2012-01-19: qty 4

## 2012-01-19 MED ORDER — HYDROMORPHONE 0.3 MG/ML IV SOLN
INTRAVENOUS | Status: DC
  Administered 2012-01-19: 1.59 mg via INTRAVENOUS
  Administered 2012-01-19: 11:00:00 via INTRAVENOUS
  Administered 2012-01-19: 0.5 mg via INTRAVENOUS
  Administered 2012-01-19: 0.899 mg via INTRAVENOUS
  Administered 2012-01-20: 0.4 mg via INTRAVENOUS
  Administered 2012-01-20: 0.59 mg via INTRAVENOUS
  Administered 2012-01-20: 0.8 mg via INTRAVENOUS
  Administered 2012-01-20: 0.399 mg via INTRAVENOUS
  Administered 2012-01-20: 0.999 mg via INTRAVENOUS
  Administered 2012-01-20: 0.599 mg via INTRAVENOUS
  Administered 2012-01-20: 17:00:00 via INTRAVENOUS
  Administered 2012-01-21: 1.27 mg via INTRAVENOUS
  Administered 2012-01-21: 0.339 mg via INTRAVENOUS
  Administered 2012-01-21: 1.8 mg via INTRAVENOUS
  Administered 2012-01-21: 0.1 mg via INTRAVENOUS
  Administered 2012-01-21: 0.199 mg via INTRAVENOUS
  Administered 2012-01-21: 0.1 mg via INTRAVENOUS
  Administered 2012-01-22: 0.4 mg via INTRAVENOUS
  Administered 2012-01-22: 0.199 mg via INTRAVENOUS
  Filled 2012-01-19 (×2): qty 25

## 2012-01-19 MED ORDER — DIPHENHYDRAMINE HCL 50 MG/ML IJ SOLN: 12.5000 mg | Freq: Four times a day (QID) | INTRAMUSCULAR | Status: DC | PRN

## 2012-01-19 MED ORDER — SODIUM CHLORIDE 0.9 % IJ SOLN: 9.0000 mL | INTRAMUSCULAR | Status: DC | PRN

## 2012-01-19 MED ORDER — ONDANSETRON HCL 4 MG/2ML IJ SOLN
4.0000 mg | Freq: Four times a day (QID) | INTRAMUSCULAR | Status: DC | PRN
  Filled 2012-01-19: qty 2

## 2012-01-19 MED ORDER — NALOXONE HCL 0.4 MG/ML IJ SOLN: 0.4000 mg | INTRAMUSCULAR | Status: DC | PRN

## 2012-01-19 MED ORDER — DIPHENHYDRAMINE HCL 12.5 MG/5ML PO ELIX
12.5000 mg | ORAL_SOLUTION | Freq: Four times a day (QID) | ORAL | Status: DC | PRN
  Filled 2012-01-19: qty 5

## 2012-01-19 NOTE — Progress Notes (Signed)
Patient ID: Mallory Morales, female   DOB: [birth year], 56 y.o.   MRN: [id number] 5 Days Post-Op  Subjective: Pt feels slightly nauseated this am.  No flatus from ostomy yet.  Some weird reaction at night with morphine.  Objective: Vital signs in last 24 hours: Temp:  [97.3 F (36.3 C)-100.3 F (37.9 C)] 97.3 F (36.3 C) (01/28 0553) Pulse Rate:  [83-126] 101  (01/28 0553) Resp:  [18-24] 18  (01/28 0801) BP: (124-148)/(63-98) 147/98 mmHg (01/28 0553) SpO2:  [90 %-99 %] 93 % (01/28 0801) Last BM Date: 01/13/12  Intake/Output from previous day: 01/27 0701 - 01/28 0700 In: 3196.3 [I.V.:2621.3; NG/GT:375; IV Piggyback:200] Out: 2225 [Urine:1675; Emesis/NG output:500; Stool:50] Intake/Output this shift:    PE: Abd: soft, mild distention, -BS, appropriately tender.  VAC in place.  Ostomy with some serous drainage.  No air.  Stoma is pink and viable.  Lab Results:   Basename 01/19/12 0645 01/18/12 0525  WBC 9.0 9.7  HGB 9.4* 9.4*  HCT 27.7* 27.6*  PLT 166 143*   BMET  Basename 01/19/12 0645 01/18/12 0525  NA 146* 143  K 4.0 3.7  CL 111 111  CO2 28 29  GLUCOSE 181* 181*  BUN 6 7  CREATININE 0.67 0.70  CALCIUM 8.2* 8.1*   PT/INR No results found for this basename: LABPROT:2,INR:2 in the last 72 hours   Studies/Results: No results found.  Anti-infectives: Anti-infectives     Start     Dose/Rate Route Frequency Ordered Stop   01/14/12 1600  piperacillin-tazobactam (ZOSYN) IVPB 3.375 g       3.375 g 12.5 mL/hr over 240 Minutes Intravenous Every 8 hours 01/14/12 1434     01/14/12 0245   cefOXitin (MEFOXIN) 2 g in dextrose 5 % 50 mL IVPB        2 g 100 mL/hr over 30 Minutes Intravenous 60 min pre-op 01/14/12 0238 01/14/12 1020           Assessment/Plan  1. S/p colectomy/colostomy for T3N0 colon cancer  2. Post-op ileus 3. HTN  Plan: 1. Cont NGT secondary to post op ileus 2. Await bowel function 3. Will change PCA to low dose dilaudid given reaction to  morphine 4. Cont mobilization  LOS: 6 days    Teion Ballin E 01/19/2012

## 2012-01-19 NOTE — Plan of Care (Signed)
Problem: Consults Goal: Skin Care Protocol Initiated - if indicated If consults are not indicated, leave blank or document N/A Outcome: Progressing Wound vac initiated 01/16/12 midline open incision.  Problem: Phase I Progression Outcomes Goal: Pain controlled with appropriate interventions Outcome: Progressing Switched from Morphine PCA to Dilaudid PCA 01/19/12 Goal: OOB as tolerated unless otherwise ordered Outcome: Progressing Up in hallway and BRP. Goal: Tubes/drains patent Outcome: Progressing Still has NG tube. Goal: Voiding-avoid urinary catheter unless indicated Outcome: Completed/Met Date Met:  01/19/12 Foley D/C'd 01/17/12.

## 2012-01-19 NOTE — Progress Notes (Signed)
Agree with [name]'s note. Patient interviewed and examined.  Abdomen is soft. Hypoactive bowel sounds. Wound and stoma okay. No real output.  Laboratory looks okay.  She still has a lot of third space fluid retention and anasarca. We'll give another dose of Lasix 40 mg IV today, and we will cut her IVs back to 75 cc per hour.  Check B-met in a.m.   Angelia Mould. Derrell Lolling, M.D., [facility] General and Minimally invasive Surgery Breast and Colorectal Surgery Office:   [phone] Pager:   [phone]

## 2012-01-19 NOTE — Consult Note (Signed)
Wound care followup: One piece black foam applied to abd at 125cm, wound beefy red with patchy areas of adipose.  No odor, mod tan drainage.  Vac dressing changed.  Tunneling area 1 cm, does not require white foam anymore at this time.. pt tolerated with minimal discomfort.   No stool or flatus in pouch. applied one piece pouch  small pink drainage.  Pouch close to vac it will need to be changed with every vac dressing Pt able to open and close velcro.  Family members assisted with pouch application. Stoma red and viable above skin level. Plan dressing change Wed.   Cammie Mcgee, RN, MSN, [employer]  [phone].

## 2012-01-20 ENCOUNTER — Inpatient Hospital Stay (HOSPITAL_COMMUNITY): Payer: 59

## 2012-01-20 LAB — BASIC METABOLIC PANEL
CO2: 28 mEq/L (ref 19–32)
Chloride: 111 mEq/L (ref 96–112)
Glucose, Bld: 169 mg/dL — ABNORMAL HIGH (ref 70–99)
Potassium: 3.7 mEq/L (ref 3.5–5.1)
Sodium: 145 mEq/L (ref 135–145)

## 2012-01-20 MED ORDER — PROMETHAZINE HCL 25 MG/ML IJ SOLN
12.5000 mg | Freq: Four times a day (QID) | INTRAMUSCULAR | Status: DC | PRN
  Administered 2012-01-20: 12.5 mg via INTRAVENOUS
  Filled 2012-01-20: qty 1

## 2012-01-20 MED ORDER — WHITE PETROLATUM GEL
Status: AC
  Administered 2012-01-20: 12:00:00
  Filled 2012-01-20: qty 5

## 2012-01-20 NOTE — Progress Notes (Signed)
Pt pulled NG tube accidentally during bathing. Dr Derrell Lolling notifiied - orderd to leave it out.

## 2012-01-20 NOTE — Progress Notes (Signed)
6 Days Post-Op  Subjective: Past a lot of flatus and stool per ostomy, but having some abdominal pain. Denies nausea, says she's a little bit hungry. In she drainage less. Anxious.  Diuresed fairly well from Lasix. Morning lab work pending.  Heart rate still 107.  Objective: Vital signs in last 24 hours: Temp:  [97.5 F (36.4 C)-99.2 F (37.3 C)] 98.7 F (37.1 C) (01/29 0537) Pulse Rate:  [102-115] 107  (01/29 0537) Resp:  [18-22] 20  (01/29 0537) BP: (133-153)/(88-99) 136/91 mmHg (01/29 0537) SpO2:  [92 %-98 %] 98 % (01/29 0537) Last BM Date: 01/13/12  Intake/Output from previous day: 01/28 0701 - 01/29 0700 In: 2079 [I.V.:1925; IV Piggyback:154] Out: 3750 [Urine:3100; Emesis/NG output:100; Stool:550] Intake/Output this shift:    General appearance: alert. Anxious. Pressure speech. Does not appear toxic or in any severe distress. Cardio: regular tachycardia GI: soft but with some diffuse tenderness. No more distended than yesterday. Stoma pink. Stool and air in the bag. Back on midline. Extr:  Less edema Lab Results:  No results found for this or any previous visit (from the past 24 hour(s)).   Studies/Results: @RISRSLT24 @     . furosemide  40 mg Intravenous Once  . heparin subcutaneous  5,000 Units Subcutaneous Q8H  . HYDROmorphone PCA 0.3 mg/mL   Intravenous Q4H  . metoprolol  5 mg Intravenous Q6H  . pantoprazole (PROTONIX) IV  40 mg Intravenous Q24H  . piperacillin-tazobactam (ZOSYN)  IV  3.375 g Intravenous Q8H  . sodium chloride  1,000 mL Intravenous Once  . DISCONTD: morphine   Intravenous Q4H     Assessment/Plan: s/p Procedure(s): COLON RESECTION  Status post emergent left colectomy with colostomy for obstructing carcinoma, pathologic stage T3,  N0  Ileus resolving. Her crampiness and pain may be due to resumption of bowel activity. We'll clamp NG tube and allow sips of clear liquids.  Check lab work today and laboratory.  We will leave IV fluids  as his.  Regular tachycardia, on beta blockers  Anasarca better   LOS: 7 days    Gleb Mcguire M. Derrell Lolling, M.D., [facility] General and Minimally invasive Surgery Breast and Colorectal Surgery Office:   [phone] Pager:   [phone] 01/20/2012  . .prob

## 2012-01-21 ENCOUNTER — Inpatient Hospital Stay (HOSPITAL_COMMUNITY): Payer: 59

## 2012-01-21 LAB — BASIC METABOLIC PANEL
CO2: 24 mEq/L (ref 19–32)
Calcium: 8 mg/dL — ABNORMAL LOW (ref 8.4–10.5)
Creatinine, Ser: 0.64 mg/dL (ref 0.50–1.10)
Glucose, Bld: 166 mg/dL — ABNORMAL HIGH (ref 70–99)

## 2012-01-21 LAB — CBC
Hemoglobin: 9.9 g/dL — ABNORMAL LOW (ref 12.0–15.0)
MCH: 29.6 pg (ref 26.0–34.0)
MCV: 86.3 fL (ref 78.0–100.0)
Platelets: 229 10*3/uL (ref 150–400)
RBC: 3.35 MIL/uL — ABNORMAL LOW (ref 3.87–5.11)

## 2012-01-21 MED ORDER — FUROSEMIDE 10 MG/ML IJ SOLN
40.0000 mg | Freq: Once | INTRAMUSCULAR | Status: AC
  Administered 2012-01-21: 40 mg via INTRAVENOUS
  Filled 2012-01-21: qty 4

## 2012-01-21 NOTE — Progress Notes (Signed)
7 Days Post-Op  Subjective     .   Stable and alert. Ambulating in all.Enjoying liquids but not hungry and does not want to eat solid food. Denies nausea. Lots of stool and flatus coming out of colostomy bag.  She notes soft tissue edema in her thighs.  Her only complaint is shortness of breath, difficulty taking a deep breath. She denies chest pain, pleuritic pain, diaphoresis. She denies sputum production.  Chemistries, BUN and creatinine normal. Hemoglobin 9.9. WBC 8500. No fever. Heart rate 96. Objective: Vital signs in last 24 hours: Temp:  [97.7 F (36.5 C)-99.2 F (37.3 C)] 97.9 F (36.6 C) (01/30 0403) Pulse Rate:  [96-111] 96  (01/30 0403) Resp:  [15-30] 20  (01/30 0403) BP: (138-147)/(83-94) 138/83 mmHg (01/30 0403) SpO2:  [90 %-99 %] 98 % (01/30 0403) Last BM Date: 01/20/12  Intake/Output from previous day: 01/29 0701 - 01/30 0700 In: 2461.3 [P.O.:1440; I.V.:921.3; IV Piggyback:100] Out: 1335 [Urine:495; Stool:840] Intake/Output this shift:    General appearance: alert. Less anxious. Skin warm and dry. No distress. Resp: decreased breath sounds at both bases. Good aeration upper lungs. No rhonchi or wheeze. No pleural rub noted. O2 sats 95% at rest, but she does go down into the 80s when she exerts herself. GI: abdomen is soft. Appropriately sore. The back on the midline wound clean. Ostomy pink and healthy with stool and air in bag.  Lab Results:  Results for orders placed during the hospital encounter of 01/13/12 (from the past 24 hour(s))  CBC     Status: Abnormal   Collection Time   01/21/12  5:25 AM      Component Value Range   WBC 8.5  4.0 - 10.5 (K/uL)   RBC 3.35 (*) 3.87 - 5.11 (MIL/uL)   Hemoglobin 9.9 (*) 12.0 - 15.0 (g/dL)   HCT 40.9 (*) 81.1 - 46.0 (%)   MCV 86.3  78.0 - 100.0 (fL)   MCH 29.6  26.0 - 34.0 (pg)   MCHC 34.3  30.0 - 36.0 (g/dL)   RDW 91.4  78.2 - 95.6 (%)   Platelets 229  150 - 400 (K/uL)  BASIC METABOLIC PANEL     Status: Abnormal   Collection Time   01/21/12  5:25 AM      Component Value Range   Sodium 143  135 - 145 (mEq/L)   Potassium 3.8  3.5 - 5.1 (mEq/L)   Chloride 112  96 - 112 (mEq/L)   CO2 24  19 - 32 (mEq/L)   Glucose, Bld 166 (*) 70 - 99 (mg/dL)   BUN 11  6 - 23 (mg/dL)   Creatinine, Ser 2.13  0.50 - 1.10 (mg/dL)   Calcium 8.0 (*) 8.4 - 10.5 (mg/dL)   GFR calc non Af Amer >90  >90 (mL/min)   GFR calc Af Amer >90  >90 (mL/min)     Studies/Results: @RISRSLT24 @     . heparin subcutaneous  5,000 Units Subcutaneous Q8H  . HYDROmorphone PCA 0.3 mg/mL   Intravenous Q4H  . metoprolol  5 mg Intravenous Q6H  . pantoprazole (PROTONIX) IV  40 mg Intravenous Q24H  . piperacillin-tazobactam (ZOSYN)  IV  3.375 g Intravenous Q8H  . sodium chloride  1,000 mL Intravenous Once  . white petrolatum         Assessment/Plan: s/p Procedure(s): COLON RESECTION   Status post emergent left colectomy with colostomy for obstructing carcinoma, pathologic stage TIII, N0. Ileus resolving, advanced to full liquid diet  Dyspnea without  chest pain. We'll check chest x-ray. She may need further diuresis. Decrease IV rate  Regular tachycardia, on beta blockers, better  Anasarca, improved, consider further diuresis.  Acute renal failure, resolved    LOS: 8 days    Damisha Wolff M 01/21/2012  . .prob

## 2012-01-21 NOTE — Consult Note (Signed)
Ostomy follow-up:  Pt assisted with pouch change.  Able to empty and close velcro without assistance.  Stoma located close to Vac  and pouch will need to be changed with every dressing,  Stoma red and viable, above skin level, opening in pouch must be cut close to edge of wafer and patient is unable to use 2 piece pouching system at this time. Applied one piece, barrier cut to almost 2 inches.  Mod liquid brown stool in pouch.    Wound care follow-up:  Vac dressing changed.  One piece black foam applied to 125 cm cont suction.  Wound beefy red, no odor, mod tan drainage.  12X2X1.5 cm.  Tolerated without discomfort. Plan dressing change Friday.  Cammie Mcgee, RN, MSN, [employer]  [phone]

## 2012-01-22 ENCOUNTER — Inpatient Hospital Stay (HOSPITAL_COMMUNITY): Payer: 59

## 2012-01-22 DIAGNOSIS — M7989 Other specified soft tissue disorders: Secondary | ICD-10-CM

## 2012-01-22 DIAGNOSIS — E876 Hypokalemia: Secondary | ICD-10-CM | POA: Diagnosis not present

## 2012-01-22 DIAGNOSIS — N179 Acute kidney failure, unspecified: Secondary | ICD-10-CM | POA: Diagnosis not present

## 2012-01-22 DIAGNOSIS — K567 Ileus, unspecified: Secondary | ICD-10-CM | POA: Diagnosis not present

## 2012-01-22 DIAGNOSIS — Z86711 Personal history of pulmonary embolism: Secondary | ICD-10-CM | POA: Diagnosis not present

## 2012-01-22 DIAGNOSIS — E8770 Fluid overload, unspecified: Secondary | ICD-10-CM | POA: Diagnosis present

## 2012-01-22 DIAGNOSIS — I2699 Other pulmonary embolism without acute cor pulmonale: Secondary | ICD-10-CM | POA: Diagnosis not present

## 2012-01-22 LAB — BASIC METABOLIC PANEL
BUN: 10 mg/dL (ref 6–23)
Calcium: 8.3 mg/dL — ABNORMAL LOW (ref 8.4–10.5)
GFR calc non Af Amer: >90 mL/min (ref >90)
Glucose, Bld: 133 mg/dL — ABNORMAL HIGH (ref 70–99)

## 2012-01-22 MED ORDER — WARFARIN SODIUM 7.5 MG PO TABS
7.5000 mg | ORAL_TABLET | Freq: Once | ORAL | Status: AC
  Administered 2012-01-22: 7.5 mg via ORAL
  Filled 2012-01-22: qty 1

## 2012-01-22 MED ORDER — FUROSEMIDE 10 MG/ML IJ SOLN
80.0000 mg | Freq: Once | INTRAMUSCULAR | Status: AC
  Administered 2012-01-22: 80 mg via INTRAVENOUS
  Filled 2012-01-22: qty 8

## 2012-01-22 MED ORDER — COUMADIN BOOK
Freq: Once | Status: AC
  Administered 2012-01-22: 19:00:00
  Filled 2012-01-22: qty 1

## 2012-01-22 MED ORDER — HEPARIN BOLUS VIA INFUSION
5000.0000 [IU] | Freq: Once | INTRAVENOUS | Status: AC
  Administered 2012-01-22: 5000 [IU] via INTRAVENOUS
  Filled 2012-01-22: qty 5000

## 2012-01-22 MED ORDER — OXYCODONE-ACETAMINOPHEN 5-325 MG PO TABS
1.0000 | ORAL_TABLET | ORAL | Status: DC | PRN
  Administered 2012-01-22 – 2012-01-24 (×2): 1 via ORAL
  Administered 2012-01-24: 2 via ORAL
  Administered 2012-01-25 – 2012-01-26 (×5): 1 via ORAL
  Filled 2012-01-22 (×4): qty 1
  Filled 2012-01-22: qty 2
  Filled 2012-01-22 (×3): qty 1

## 2012-01-22 MED ORDER — IOHEXOL 300 MG/ML  SOLN
80.0000 mL | Freq: Once | INTRAMUSCULAR | Status: AC | PRN
  Administered 2012-01-22: 80 mL via INTRAVENOUS

## 2012-01-22 MED ORDER — PANTOPRAZOLE SODIUM 40 MG PO TBEC
40.0000 mg | DELAYED_RELEASE_TABLET | Freq: Every day | ORAL | Status: DC
  Administered 2012-01-22 – 2012-01-27 (×6): 40 mg via ORAL
  Filled 2012-01-22 (×6): qty 1

## 2012-01-22 MED ORDER — METOPROLOL SUCCINATE ER 25 MG PO TB24
25.0000 mg | ORAL_TABLET | Freq: Every day | ORAL | Status: DC
  Administered 2012-01-22 – 2012-01-26 (×5): 25 mg via ORAL
  Filled 2012-01-22 (×5): qty 1

## 2012-01-22 MED ORDER — POTASSIUM CHLORIDE CRYS ER 20 MEQ PO TBCR
40.0000 meq | EXTENDED_RELEASE_TABLET | Freq: Once | ORAL | Status: AC
  Administered 2012-01-22: 40 meq via ORAL
  Filled 2012-01-22: qty 2

## 2012-01-22 MED ORDER — WARFARIN VIDEO: Freq: Once | Status: DC

## 2012-01-22 MED ORDER — HEPARIN SOD (PORCINE) IN D5W 100 UNIT/ML IV SOLN
1500.0000 [IU]/h | INTRAVENOUS | Status: DC
  Administered 2012-01-22: 1150 [IU]/h via INTRAVENOUS
  Administered 2012-01-23: 1300 [IU]/h via INTRAVENOUS
  Administered 2012-01-23: 1400 [IU]/h via INTRAVENOUS
  Administered 2012-01-24: 1500 [IU]/h via INTRAVENOUS
  Filled 2012-01-22 (×4): qty 250

## 2012-01-22 NOTE — Progress Notes (Signed)
Called to start 2nd iv site for pt needing more access, due to incompatible meds;  Pt has been hospitalized since Jan 22, and has had surgery;  Pt on heparin drip now, also needing K, and Lasix per RN;  Able to start another line, however, this is the 5th iv the pt has had in addition to frequent labs;  Suggest picc line for this pt; please advise;  Thank you!

## 2012-01-22 NOTE — Progress Notes (Signed)
Patient interviewed and examined. Care plan discussed with patient and husband.  I agree with the assessment and care plan as outlined by Mallory Morales.   Mallory Morales. Derrell Lolling, M.D., [facility] General and Minimally invasive Surgery Breast and Colorectal Surgery Office:   [phone] Pager:   [phone]

## 2012-01-22 NOTE — Plan of Care (Signed)
Problem: Phase II Progression Outcomes Goal: Surgical site without signs of infection Outcome: Completed/Met Date Met:  01/22/12 Wound vac intact and colostomy; site clean.  Problem: Phase III Progression Outcomes Goal: Demonstrates TCDB, IS independently Outcome: Progressing DX with pulmonary emboli 01/22/12-heparin infusion.

## 2012-01-22 NOTE — Consult Note (Addendum)
WILHELMINE KROGSTAD MRN: [id number] DOB/AGE: 1956-06-08 56 y.o. Primary Care Physician:EHINGER,ROBERT R, MD, MD Admit date: 01/13/2012 Consult date: 1/31/ 2013 Attending requesting consult: Dr. Claud Kelp. Reason for consult: Management of bilateral PEs/dyspnea HPI:  Mallory Morales is a pleasant 56 year old African American female with history of hypertension,hyperlipidemia, history of abdominal hysterectomy and hernia repair who was admitted to the hospital for obstructing sigmoid colon mass consistent with colon cancer status post colectomy and colostomy. Patient initially started complaining of a left-sided abdominal pain since 06/25/2011.At that time, until recently, she was able to medicate it with Tylenol. She did not feel that it was that significant, but on January 11th her pain markedly worsened and she sought care from the ER. A CT scan was performed at that time and she was noted to have a segment of the proximal sigmoid colon that was thickened. No overt lesion was identified, but a malignancy was still a consideration. The thought was that she had a colitis in that area and she was treated with Flagyl and Cipro. This did not resolve her pain and she was also evaluated in her gastroenterologist office. With the findings of the CT scan and her persistent complaints a colonoscopy was going to be performed. She had a colonoscopy by Dr. Noe Gens in [facility] on 06/20/2009 and the findings were only significant for a hyperplastic polyp in the transverse colon and sigmoid colon (15 cm). No adenomas were detected. She tried to prep for the colonoscopy on two separate occasions, but she vomited the prep. Despite the inability to prep orally she was told to come into her gastroenterologist office to undergo a FFS. She did manage to prep with two Fleets enemas yesterday. The FFS revealed a large hard obstructing sigmoid lesions consistent with a sigmoid colon cancer. This correlates with the CT scan site of  abnormality and where she points to the pain in her abdomen. Multiple cold biopsies were obtained and the site was tattooed with SPOT. As a result of the findings was directly admitted to the hospital for further evaluation and treatment. Patient subsequently underwent went emergent left colectomy with colostomy for obstructing carcinoma, pathologic stage TIII, N0. Patient subsequently developed an ileus postop which has since resolved. Patient also developed an acute renal failure felt to be prerenal azotemia and resolved with aggressive IV fluid hydration. Over the past 3 days patient had had some complaints of dyspnea on exertion, orthopnea, and generalized bilateral lower extremity swelling. Chest x-ray which was done did show some bilateral pleural effusions patient was given  Lasix 40 mg IV x1 however still continued to have dyspnea. CT scan of the chest was done which was consistent with bilateral pulmonary emboli right greater than left. We were called to consult on the patient for further evaluation and management of her pulmonary emboli. Patient denies any long car rides. Patient denies any family history of DVTs or PEs. Patient denies any chest pain, no nausea, no vomiting, some abdominal discomfort secondary to recent surgery. Patient denies any dysuria, no weakness. Patient complaining of bilateral lower extremity edema.   Past Medical History  Diagnosis Date  . Hypertension   . Hyperlipemia     Past Surgical History  Procedure Date  . Abdominal hysterectomy   . Hernia repair   . Colon resection 01/14/2012    Procedure: COLON RESECTION;  Surgeon: Adolph Pollack, MD;  Location: [facility] OR;  Service: General;  Laterality: N/A;  left colectomy, mobilization of splenic flexure, ostomy.  Prior to Admission medications   Medication Sig Start Date End Date Taking? Authorizing Provider  acetaminophen (TYLENOL) 500 MG tablet Take 500 mg by mouth every 6 (six) hours as needed. For pain   Yes  Historical Provider, MD  hydrochlorothiazide (HYDRODIURIL) 25 MG tablet Take 25 mg by mouth daily.   Yes Historical Provider, MD  HYDROcodone-acetaminophen (NORCO) 5-325 MG per tablet Take 2 tablets by mouth every 4 (four) hours as needed. For pain   Yes Historical Provider, MD  simvastatin (ZOCOR) 40 MG tablet Take 40 mg by mouth daily.   Yes Historical Provider, MD    Allergies:  Allergies  Allergen Reactions  . Strawberry Swelling    History reviewed. No pertinent family history.  Social History:  reports that she has never smoked. She has quit using smokeless tobacco. She reports that she does not use illicit drugs. Her alcohol history not on file.  ROS: All systems reviewed with the patient and was positive as per HPI otherwise all other systems are negative.  PHYSICAL EXAM: Blood pressure 124/75, pulse 96, temperature 98.2 F (36.8 C), temperature source Oral, resp. rate 20, height 5\' 5"  (1.651 m), weight 72.848 kg (160 lb 9.6 oz), SpO2 100.00%. General: Well-developed well-nourished in no acute cardiopulmonary distress. Speaking in full sentences.  HEENT: Normocephalic atraumatic. Pupils equal round and reactive to light and accommodation. Extraocular movements intact. Oropharynx is clear, no lesions, no exudates. Neck is supple no lymphadenopathy. No bruits, no goiter. Heart: Regular rate and rhythm, without murmurs, rubs, gallops. Lungs: Clear to auscultation bilaterally. Abdomen: Soft, nontender, nondistended, positive bowel sounds. Extremities: No clubbing cyanosis. 2-3+ bilateral lower extremity edema up to the thighs, with positive pedal pulses. Neuro: Alert and onto x3. Cranial nerves II through XII are grossly intact. Sensation is intact. 5 out of 5 bilateral upper extremity strength. 5 out of 5 bilateral lower extremity strength. Visual fields intact. Gait not tested secondary to safety.     EKG: None  Recent Results (from the past 240 hour(s))  SURGICAL PCR SCREEN      Status: Normal   Collection Time   01/14/12  9:20 AM      Component Value Range Status Comment   MRSA, PCR NEGATIVE  NEGATIVE  Final    Staphylococcus aureus NEGATIVE  NEGATIVE  Final      Lab results:  Basename 01/22/12 0520 01/21/12 0525  NA 141 143  K 3.4* 3.8  CL 106 112  CO2 27 24  GLUCOSE 133* 166*  BUN 10 11  CREATININE 0.71 0.64  CALCIUM 8.3* 8.0*  MG -- --  PHOS -- --   No results found for this basename: AST:2,ALT:2,ALKPHOS:2,BILITOT:2,PROT:2,ALBUMIN:2 in the last 72 hours No results found for this basename: LIPASE:2,AMYLASE:2 in the last 72 hours  Basename 01/21/12 0525  WBC 8.5  NEUTROABS --  HGB 9.9*  HCT 28.9*  MCV 86.3  PLT 229   No results found for this basename: CKTOTAL:3,CKMB:3,CKMBINDEX:3,TROPONINI:3 in the last 72 hours No components found with this basename: POCBNP:3 No results found for this basename: DDIMER in the last 72 hours No results found for this basename: HGBA1C:2 in the last 72 hours No results found for this basename: CHOL:2,HDL:2,LDLCALC:2,TRIG:2,CHOLHDL:2,LDLDIRECT:2 in the last 72 hours No results found for this basename: TSH,T4TOTAL,FREET3,T3FREE,THYROIDAB in the last 72 hours No results found for this basename: VITAMINB12:2,FOLATE:2,FERRITIN:2,TIBC:2,IRON:2,RETICCTPCT:2 in the last 72 hours Imaging results:  Dg Chest 2 View  01/21/2012  *RADIOLOGY REPORT*  Clinical Data: Postop dyspnea  CHEST - 2 VIEW  Comparison: 01/13/2012  Findings: Heart size is normal.  There are bilateral pleural effusions left greater than right.  No interstitial edema.  Atelectasis is noted within both lung bases.  IMPRESSION:  1.  Bilateral pleural effusions, left greater than right.  Original Report Authenticated By: Rosealee Albee, M.D.   Dg Chest 2 View  01/13/2012  *RADIOLOGY REPORT*  Clinical Data: Colon tumor  CHEST - 2 VIEW  Comparison: None  Findings: Nasogastric tube tip is below the GE junction.  Heart size is normal.  No pleural effusion or  pulmonary interstitial edema identified.  There is no airspace consolidation identified.  Review of the visualized osseous structures is negative.  IMPRESSION:  1.  No acute cardiopulmonary abnormalities.  Original Report Authenticated By: Rosealee Albee, M.D.   Dg Abd 1 View  01/13/2012  *RADIOLOGY REPORT*  Clinical Data: Abdominal pain  ABDOMEN - 1 VIEW  Comparison: 01/02/2012  Findings: Mild colonic distention across the right side of the abdomen is present.  No obvious free intraperitoneal gas.  Gas filled nondilated loops of small bowel in the right upper quadrant. Bony framework is unremarkable. Decompressed sigmoid colon is noted.  IMPRESSION: Distention of the right and transverse colon. Earlier partial distal colonic obstruction may be present.  Original Report Authenticated By: Donavan Burnet, M.D.   Ct Angio Chest W/cm &/or Wo Cm  01/22/2012  *RADIOLOGY REPORT*  Clinical Data: Shortness of breath.  Hypertension.  History colon cancer.  CT ANGIOGRAPHY CHEST  Technique:  Multidetector CT imaging of the chest using the standard protocol during bolus administration of intravenous contrast. Multiplanar reconstructed images including MIPs were obtained and reviewed to evaluate the vascular anatomy.  Contrast: 80mL OMNIPAQUE IOHEXOL 300 MG/ML IV SOLN  Comparison: Plain film of 1 day prior.  No prior chest CT.  The CT abdomen of 01/02/2012 is reviewed.  Findings: Lung windows demonstrate 4 mm right middle lobe lung nodule on image 60 of series 9.  Immediately inferior nodule measures 4 mm on image 63.  Bibasilar collapse / consolidative change. 2 mm right lower lobe nodule on image 35.  Soft tissue windows:  The quality of this exam for evaluation of pulmonary embolism is sufficient.  Moderate burden right greater than left pulmonary emboli.  Example right upper lobe image 44 series 11.  Right lower lobe image 47 series 11.  The pulmonary trunk is mildly dilated, 3.1 cm.  No right heart strain.  Normal  aortic caliber without dissection.  Mild cardiomegaly with small left greater than right pleural effusions.  No mediastinal or hilar adenopathy.  Limited abdominal imaging demonstrates small volume upper abdominal ascites.  New since the prior exam.  No acute osseous abnormality.  IMPRESSION:  1.  Moderate burden right greater left pulmonary emboli.  Pulmonary artery enlargement suggests a component of pulmonary arterial hypertension. These results will be called to the ordering clinician or representative by the Radiologist Assistant, and communication documented in the PACS Dashboard. 2.  Bilateral pleural effusions with bibasilar atelectasis versus infection.  Atelectasis is slightly favored. 3.  Small volume upper abdominal ascites, new since 01/02/2012.  Original Report Authenticated By: Consuello Bossier, M.D.   Ct Abdomen Pelvis W Contrast  01/02/2012  *RADIOLOGY REPORT*  Clinical Data: Left lower quadrant right lower quadrant abdominal pain for 1 month, history hypertension, hysterectomy  CT ABDOMEN AND PELVIS WITH CONTRAST  Technique:  Multidetector CT imaging of the abdomen and pelvis was performed following the standard protocol during bolus administration of  intravenous contrast. Sagittal and coronal MPR images reconstructed from axial data set.  Contrast: OMNIPAQUE IOHEXOL 300 MG/ML IV SOLN. Dilute oral contrast.  Comparison: None  Findings: Mild dependent atelectasis at lung bases. Liver, spleen, pancreas, kidneys, and adrenal glands normal. Stomach and small bowel loops unremarkable. Small amount nonspecific free pelvic fluid. Uterus surgically absent with unremarkable ovaries. Normal appendix.  Mild wall thickening of distal descending and proximal sigmoid colon. Minimal pericolic edema posterolateral to the thickened segment. Colon proximal to this is slightly dilated without additional wall thickening. Decompression of distal sigmoid colon and rectum. No mass, adenopathy, or hernia. No acute  osseous findings. Degenerative disc disease changes L5-S1.  IMPRESSION: Segmental wall thickening of colon at distal descending/sigmoid colon junction with minimal infiltration of pericolic fat. This could represent a focal inflammatory process such as segmental colitis, acute diverticulitis (though no definite colonic diverticula identified), or neoplasm. Colonoscopy recommended. The presence of mild distention of the colon proximal to the area of wall thickening raises a question of partial obstruction.  Original Report Authenticated By: Lollie Marrow, M.D.   US Renal  01/15/2012  *RADIOLOGY REPORT*  Clinical Data:  Acute renal insufficiency  RENAL/URINARY TRACT ULTRASOUND COMPLETE  Comparison:  01/02/2012  Findings: Diminished exam detail due to early postoperative state. Difficulty immobilizing patient for adequate the visualization of the organs.  Right Kidney:  Measures 9.4 cm.  Normal in size and parenchymal echogenicity.  No evidence of mass or hydronephrosis.  Left Kidney:  Measures 10.4 cm.  Normal in size and parenchymal echogenicity.  No evidence of mass or hydronephrosis.  Bladder:  Appears normal for degree of bladder distention.  Incidental note is made of fluid in the right upper quadrant of the abdomen.  IMPRESSION: No evidence for obstructive uropathy.  Original Report Authenticated By: Rosealee Albee, M.D.   Dg Abd Portable 1v  01/20/2012  *RADIOLOGY REPORT*  Clinical Data: Abdominal pain and distention.  PORTABLE ABDOMEN - 1 VIEW 01/20/2012 0837 hours:  Comparison: One-view abdomen x-ray 01/13/2012 [facility]. CT abdomen and pelvis 01/02/2012 [facility].  Findings: Nasogastric tube tip in the mid body of the stomach. Interval gastrostomy tube placement, overlying the distal body of the stomach.  Mild gaseous distention of 3 loops of small bowel in the left mid abdomen.  No suggestion of free air on the supine image.  No abnormal calcifications.  IMPRESSION: Localized  ileus in the left mid abdomen versus partial small bowel obstruction.  Original Report Authenticated By: Arnell Sieving, M.D.   Impression/Plan:  Principal Problem:  *Cancer of descending colon Active Problems:  Hypertension  Dyslipidemia  Acute renal insufficiency  PE (pulmonary embolism)  Hypokalemia  Ileus, postoperative  ARF (acute renal failure)  Volume overload   #1 bilateral pulmonary emboli right greater than left Unknown etiology. Patient with colon cancer and likely in a hypercoagulable state. No family history of DVTs or PEs. No recent long travel. Hypercoagulable panel has been ordered and is currently pending. Agree with bilateral lower extremity Dopplers which are pending. Will check a 2-D echo to rule out right ventricular strain. Patient is currently on IV heparin. We'll start patient on Coumadin to overlap with IV heparin for at least 5 days. Goal INR is between 2-3. As this is patient's first episode will likely need to be on anticoagulations from anywhere from 6-9 months. Will follow. #2 volume overload Likely secondary to aggressive IV fluid hydration. Patient had been in renal failure postop and felt to  be prerenal in nature and was hydrated aggressively with resolution of her acute renal failure. Patient's net I./oral during this hospitalization has been +37 L approximately. Will give patient Lasix 80 mg IV x1 will follow eyes and nose and monitor renal function closely. Keep lower extremities elevated. 2-D echo has been ordered and is pending. #3 dyspnea Likely secondary to problems #1 and 2. #4 hypertension Stable. Continue metoprolol. #5 hypokalemia Secondary to diureses. Repleted, monitor and follow with diuretics. #6 postop ileus Resolved #7 acute renal failure Resolved. #8 obstructing sigmoid colonic mass status post left colectomy with colostomy pathologic stage TIII, N0 Per primary team Is been a pleasure taking care of Ms. Leibold will follow along  with you.   THOMPSON,DANIEL 01/22/2012, 5:09 PM

## 2012-01-22 NOTE — Progress Notes (Signed)
01/22/12 1400 Received report from CT angio per Janie; results- mod. burden (R) greater than (L) pulmonary emboli; pulmonary artery enlargement suggest a component of pulmonary hypertension. Results called to [place], [place] and will let Dr. Derrell Lolling know.  Leandrew Koyanagi Zakeria Kulzer,RN

## 2012-01-22 NOTE — Progress Notes (Signed)
01/22/12 1840  Vascular tech finish with lower extrem. Dopplers and stated that she will enter results shortly; did tell me that pt. Has a clot common femoral artery bil.; paged Dr. Janee Morn and order given and stated okay to let pt. know. Pt.informed and has been little anxious  and support given; husband with pt.   Leandrew Koyanagi Faline Langer,RN

## 2012-01-22 NOTE — Progress Notes (Addendum)
ANTICOAGULATION CONSULT NOTE - Initial Consult  Pharmacy Consult for Heparin Indication: pulmonary embolus  Allergies  Allergen Reactions  . Strawberry Swelling    Patient Measurements: Height: 5\' 5"  (165.1 cm) Weight: 160 lb 9.6 oz (72.848 kg) IBW/kg (Calculated) : 57  Heparin Dosing Weight: 71.7 kg  Vital Signs: Temp: 98.2 F (36.8 C) (01/31 1347) Temp src: Oral (01/31 1347) BP: 124/75 mmHg (01/31 1347) Pulse Rate: 96  (01/31 1347)  Labs:  Basename 01/22/12 0520 01/21/12 0525 01/20/12 0605  HGB -- 9.9* --  HCT -- 28.9* --  PLT -- 229 --  APTT -- -- --  LABPROT -- -- --  INR -- -- --  HEPARINUNFRC -- -- --  CREATININE 0.71 0.64 0.82  CKTOTAL -- -- --  CKMB -- -- --  TROPONINI -- -- --   Estimated Creatinine Clearance: 81.3 ml/min (by C-G formula based on Cr of 0.71).  Medical History: Past Medical History  Diagnosis Date  . Hypertension   . Hyperlipemia     Medications:  Scheduled:    . metoprolol  5 mg Intravenous Q6H  . pantoprazole  40 mg Oral Q1200  . potassium chloride  40 mEq Oral Once  . sodium chloride  1,000 mL Intravenous Once  . DISCONTD: heparin subcutaneous  5,000 Units Subcutaneous Q8H  . DISCONTD: HYDROmorphone PCA 0.3 mg/mL   Intravenous Q4H  . DISCONTD: pantoprazole (PROTONIX) IV  40 mg Intravenous Q24H  . DISCONTD: piperacillin-tazobactam (ZOSYN)  IV  3.375 g Intravenous Q8H   Infusions:    . dextrose 5 % and 0.9 % NaCl with KCl 20 mEq/L 20 mL/hr at 01/21/12 1641    Assessment: Dyspnea = PE on CT 01/22/12. 1. Obstructing sigmoid colon mass consistent with colon cancer.S/p colectomy/colostomy for obstructing carcinoma.  Post-op ileus, Resolved 2. Last dose of SQ heparin this am 0638 3. Baseline INR 1.18 on 01/14/12.  Goal of Therapy:  Heparin level 0.3-0.7 units/ml  INR 2-3  Plan:  Heparin 5000 unit IV bolus and infusion at 1150 units/hr. Check heparin level in 6-8 hrs and daily.  Coumadin 7.5mg  po x 1 Daily  PT/INR  Misty Stanley Stillinger 01/22/2012,3:00 PM

## 2012-01-22 NOTE — Progress Notes (Signed)
01/22/12 0850  DIlaudid PCA d/c'd and wasted 8ml in sink and witnessed by Ledon Snare.  Leandrew Koyanagi Robison,RN

## 2012-01-22 NOTE — Progress Notes (Signed)
VASCULAR LAB PRELIMINARY  There is evidence of bilateral partial deep vein thrombosis of the common femoral veins. No evidence of a Baker's cyst bilaterally. There is a thrombus in one of the right calf veins.       Mallory Morales, 01/22/2012, 6:57 PM

## 2012-01-22 NOTE — Progress Notes (Signed)
Patient ID: Mallory Morales, female   DOB: [birth year], 56 y.o.   MRN: [id number] 8 Days Post-Op  Subjective: Pt still c/o some SOB.  Tolerating full liquids.  Some gas pains.  No O2 desats with ambulation yesterday.  Objective: Vital signs in last 24 hours: Temp:  [98 F (36.7 C)-98.6 F (37 C)] 98.5 F (36.9 C) (01/31 0626) Pulse Rate:  [85-114] 89  (01/31 0626) Resp:  [16-20] 18  (01/31 0626) BP: (133-150)/(77-96) 133/86 mmHg (01/31 0626) SpO2:  [96 %-100 %] 96 % (01/31 0626) Last BM Date: 01/21/12  Intake/Output from previous day: 01/30 0701 - 01/31 0700 In: 390 [I.V.:290; IV Piggyback:100] Out: 2515 [Urine:1850; Emesis/NG output:100; Stool:565] Intake/Output this shift:    PE: Abd: soft, tender, +BS, Ostomy with air and stool present.  VAC with cloudy output. Heart: mildly tachy Lungs: decrease BS at bases.  Lab Results:   [facility] 01/21/12 0525  WBC 8.5  HGB 9.9*  HCT 28.9*  PLT 229   BMET  Basename 01/22/12 0520 01/21/12 0525  NA 141 143  K 3.4* 3.8  CL 106 112  CO2 27 24  GLUCOSE 133* 166*  BUN 10 11  CREATININE 0.71 0.64  CALCIUM 8.3* 8.0*   PT/INR No results found for this basename: LABPROT:2,INR:2 in the last 72 hours   Studies/Results: Dg Chest 2 View  01/21/2012  *RADIOLOGY REPORT*  Clinical Data: Postop dyspnea  CHEST - 2 VIEW  Comparison: 01/13/2012  Findings: Heart size is normal.  There are bilateral pleural effusions left greater than right.  No interstitial edema.  Atelectasis is noted within both lung bases.  IMPRESSION:  1.  Bilateral pleural effusions, left greater than right.  Original Report Authenticated By: Rosealee Albee, M.D.   Dg Abd Portable 1v  01/20/2012  *RADIOLOGY REPORT*  Clinical Data: Abdominal pain and distention.  PORTABLE ABDOMEN - 1 VIEW 01/20/2012 0837 hours:  Comparison: One-view abdomen x-ray 01/13/2012 [facility]. CT abdomen and pelvis 01/02/2012 [facility].  Findings: Nasogastric tube tip in  the mid body of the stomach. Interval gastrostomy tube placement, overlying the distal body of the stomach.  Mild gaseous distention of 3 loops of small bowel in the left mid abdomen.  No suggestion of free air on the supine image.  No abnormal calcifications.  IMPRESSION: Localized ileus in the left mid abdomen versus partial small bowel obstruction.  Original Report Authenticated By: Arnell Sieving, M.D.    Anti-infectives: Anti-infectives     Start     Dose/Rate Route Frequency Ordered Stop   01/14/12 1600  piperacillin-tazobactam (ZOSYN) IVPB 3.375 g       3.375 g 12.5 mL/hr over 240 Minutes Intravenous Every 8 hours 01/14/12 1434     01/14/12 0245   cefOXitin (MEFOXIN) 2 g in dextrose 5 % 50 mL IVPB        2 g 100 mL/hr over 30 Minutes Intravenous 60 min pre-op 01/14/12 0238 01/14/12 1020           Assessment/Plan  1. S/p colectomy/colostomy for colon CA 2. Dyspnea 3. Post-op ileus,  Resolved 4. ARF, resolved  Plan: 1. Lasix did not seem to make much improvement in SOB, according to the patient.  We will obtain a CTA today to rule out the possibility of a PE. 2. D/c PCA 3. Advance to a regular diet. 4. If CTA, negative then may have the medicine guys evaluate the patient.   LOS: 9 days    Leeanne Butters E  01/22/2012  

## 2012-01-23 DIAGNOSIS — I2699 Other pulmonary embolism without acute cor pulmonale: Secondary | ICD-10-CM

## 2012-01-23 DIAGNOSIS — I82419 Acute embolism and thrombosis of unspecified femoral vein: Secondary | ICD-10-CM | POA: Diagnosis present

## 2012-01-23 LAB — CBC
MCH: 29.2 pg (ref 26.0–34.0)
MCHC: 34.4 g/dL (ref 30.0–36.0)
RDW: 13.4 % (ref 11.5–15.5)

## 2012-01-23 LAB — PROTIME-INR
INR: 1.24 (ref 0.00–1.49)
Prothrombin Time: 15.9 seconds — ABNORMAL HIGH (ref 11.6–15.2)

## 2012-01-23 LAB — BASIC METABOLIC PANEL
Calcium: 8.4 mg/dL (ref 8.4–10.5)
GFR calc Af Amer: >90 mL/min (ref >90)
GFR calc non Af Amer: >90 mL/min (ref >90)
Glucose, Bld: 111 mg/dL — ABNORMAL HIGH (ref 70–99)
Potassium: 3.4 mEq/L — ABNORMAL LOW (ref 3.5–5.1)
Sodium: 140 mEq/L (ref 135–145)

## 2012-01-23 LAB — LUPUS ANTICOAGULANT PANEL
DRVVT: 42.8 secs — ABNORMAL HIGH (ref 34.1–42.2)
PTT Lupus Anticoagulant: 31.9 secs (ref 28.0–43.0)

## 2012-01-23 LAB — HEPARIN LEVEL (UNFRACTIONATED): Heparin Unfractionated: 0.4 IU/mL (ref 0.30–0.70)

## 2012-01-23 LAB — FACTOR 5 LEIDEN

## 2012-01-23 MED ORDER — POTASSIUM CHLORIDE CRYS ER 20 MEQ PO TBCR
40.0000 meq | EXTENDED_RELEASE_TABLET | ORAL | Status: AC
  Administered 2012-01-23 (×2): 40 meq via ORAL
  Filled 2012-01-23 (×2): qty 2

## 2012-01-23 MED ORDER — WARFARIN SODIUM 7.5 MG PO TABS
7.5000 mg | ORAL_TABLET | Freq: Once | ORAL | Status: DC
  Filled 2012-01-23: qty 1

## 2012-01-23 MED ORDER — DIPHENHYDRAMINE HCL 25 MG PO CAPS
25.0000 mg | ORAL_CAPSULE | Freq: Every day | ORAL | Status: DC
  Administered 2012-01-23 – 2012-01-25 (×3): 25 mg via ORAL
  Filled 2012-01-23 (×5): qty 1

## 2012-01-23 MED ORDER — FUROSEMIDE 10 MG/ML IJ SOLN
80.0000 mg | Freq: Two times a day (BID) | INTRAMUSCULAR | Status: AC
  Administered 2012-01-23 – 2012-01-24 (×4): 80 mg via INTRAVENOUS
  Filled 2012-01-23 (×4): qty 8

## 2012-01-23 MED ORDER — DIPHENHYDRAMINE HCL 25 MG PO CAPS: 25.0000 mg | ORAL_CAPSULE | Freq: Four times a day (QID) | ORAL | Status: DC | PRN

## 2012-01-23 MED ORDER — MUSCLE RUB 10-15 % EX CREA
TOPICAL_CREAM | CUTANEOUS | Status: DC | PRN
  Administered 2012-01-23: 23:00:00 via TOPICAL
  Filled 2012-01-23: qty 85

## 2012-01-23 MED ORDER — FONDAPARINUX SODIUM 7.5 MG/0.6ML ~~LOC~~ SOLN
7.5000 mg | SUBCUTANEOUS | Status: DC
  Administered 2012-01-26 – 2012-01-27 (×2): 7.5 mg via SUBCUTANEOUS
  Filled 2012-01-23 (×2): qty 0.6

## 2012-01-23 NOTE — Consult Note (Signed)
WOC consult follow-up Note Vac dressing changed.  CCS in to assess wound and stoma.  New area of tunneling to upper wound bed, 4 cm with mod tan drainage, no odor.Wound remains beefy red. Mod thick tan drainage in cannister.  One piece black sponge applied at 125cm cont suction.  Pt tolerated without c/o pain. Plan dressing change Monday.  Ostomy stoma located in close proximity to abd wound. This requires new pouch with every dressing change.  One piece pouch applied, stoma red and viable, above skin level, mod semi formed stool in pouch.  Pt able to close with velcro.  Stoma remains large, wafer cut to edge of boarder.    Cammie Mcgee, RN, MSN, [employer]  [phone]

## 2012-01-23 NOTE — Progress Notes (Signed)
ANTICOAGULATION CONSULT NOTE - Follow Up Consult  Pharmacy Consult for heparin Indication: pulmonary embolus  Allergies  Allergen Reactions  . Strawberry Swelling    Patient Measurements: Height: 5\' 5"  (165.1 cm) Weight: 160 lb 9.6 oz (72.848 kg) IBW/kg (Calculated) : 57   Vital Signs: Temp: 98.1 F (36.7 C) (01/31 2145) Temp src: Oral (01/31 1811) BP: 127/83 mmHg (01/31 2145) Pulse Rate: 107  (01/31 2145)  Labs:  Alvira Philips 01/23/12 0005 01/22/12 0520 01/21/12 0525 01/20/12 0605  HGB -- -- 9.9* --  HCT -- -- 28.9* --  PLT -- -- 229 --  APTT -- -- -- --  LABPROT -- -- -- --  INR -- -- -- --  HEPARINUNFRC 0.40 -- -- --  CREATININE -- 0.71 0.64 0.82  CKTOTAL -- -- -- --  CKMB -- -- -- --  TROPONINI -- -- -- --   Estimated Creatinine Clearance: 81.3 ml/min (by C-G formula based on Cr of 0.71).   Medications:  Scheduled:    . coumadin book   Does not apply Once  . furosemide  80 mg Intravenous Once  . heparin  5,000 Units Intravenous Once  . metoprolol succinate  25 mg Oral Daily  . pantoprazole  40 mg Oral Q1200  . potassium chloride  40 mEq Oral Once  . sodium chloride  1,000 mL Intravenous Once  . warfarin  7.5 mg Oral ONCE-1800  . warfarin   Does not apply Once  . DISCONTD: heparin subcutaneous  5,000 Units Subcutaneous Q8H  . DISCONTD: HYDROmorphone PCA 0.3 mg/mL   Intravenous Q4H  . DISCONTD: metoprolol  5 mg Intravenous Q6H  . DISCONTD: pantoprazole (PROTONIX) IV  40 mg Intravenous Q24H  . DISCONTD: piperacillin-tazobactam (ZOSYN)  IV  3.375 g Intravenous Q8H   Infusions:    . dextrose 5 % and 0.9 % NaCl with KCl 20 mEq/L 20 mL/hr at 01/21/12 1641  . heparin 1,150 Units/hr (01/22/12 1549)    Assessment: 55yo female therapeutic on heparin with initial dosing for new PE.  Goal of Therapy:  Heparin level 0.3-0.7 units/ml   Plan:  Will continue heparin gtt at current rate and confirm stable with additional level.  Colleen Can PharmD  BCPS 01/23/2012,12:57 AM

## 2012-01-23 NOTE — Progress Notes (Signed)
UR of chart completed.  

## 2012-01-23 NOTE — Progress Notes (Signed)
Patient ID: Mallory Morales, female   DOB: 02/02/1956, 56 y.o.   MRN: [id number] Subjective: Complaining of back pain more on the left than right - believes it is from the bed.    Objective: Weight change:   Intake/Output Summary (Last 24 hours) at 01/23/12 0831 Last data filed at 01/23/12 0600  Gross per 24 hour  Intake  514.5 ml  Output   2550 ml  Net -2035.5 ml   Blood pressure 138/86, pulse 99, temperature 98.1 F (36.7 C), temperature source Oral, resp. rate 18, height 5\' 5"  (1.651 m), weight 72.848 kg (160 lb 9.6 oz), SpO2 100.00%. Filed Vitals:   01/22/12 1811 01/22/12 2145 01/23/12 0214 01/23/12 0520  BP: 148/89 127/83 136/95 138/86  Pulse: 101 107 94 99  Temp: 98.1 F (36.7 C) 98.1 F (36.7 C) 98.3 F (36.8 C) 98.1 F (36.7 C)  TempSrc: Oral   Oral  Resp: 20 20 20 18   Height:      Weight:      SpO2: 100% 100% 99% 100%    Physical Exam: General: Well-developed well-nourished in no acute cardiopulmonary distress. Appears slightly anxious Heart: Regular rate and rhythm, without murmurs, rubs, gallops.  Lungs: Clear to auscultation bilaterally.  Abdomen: Soft, nontender, nondistended, positive bowel sounds. Ostomy bag with black liquid.  Wound clean and dry. Extremities: No clubbing cyanosis. 1 -2 + bilateral lower extremity edema, with positive pedal pulses.  Neuro: Alert and onto x3. Cranial nerves II through XII are grossly intact. Sensation is intact. 5 out of 5 bilateral upper extremity strength. 5 out of 5 bilateral lower extremity strength. Visual fields intact. Gait not tested secondary to safety   Basic Metabolic Panel:  Lab 01/22/12 4098 01/21/12 0525  NA 141 143  K 3.4* 3.8  CL 106 112  CO2 27 24  GLUCOSE 133* 166*  BUN 10 11  CREATININE 0.71 0.64  CALCIUM 8.3* 8.0*  MG -- --  PHOS -- --   Liver Function Tests:  Lab 01/19/12 0645  AST 34  ALT 17  ALKPHOS 76  BILITOT 1.2  PROT 5.0*  ALBUMIN 1.7*   CBC:  Lab 01/21/12 0525 01/19/12 0645    WBC 8.5 9.0  NEUTROABS -- --  HGB 9.9* 9.4*  HCT 28.9* 27.7*  MCV 86.3 85.8  PLT 229 166    Micro Results: Recent Results (from the past 240 hour(s))  SURGICAL PCR SCREEN     Status: Normal   Collection Time   01/14/12  9:20 AM      Component Value Range Status Comment   MRSA, PCR NEGATIVE  NEGATIVE  Final    Staphylococcus aureus NEGATIVE  NEGATIVE  Final     Studies/Results: Scheduled Meds:   . coumadin book   Does not apply Once  . furosemide  80 mg Intravenous Once  . heparin  5,000 Units Intravenous Once  . metoprolol succinate  25 mg Oral Daily  . pantoprazole  40 mg Oral Q1200  . potassium chloride  40 mEq Oral Once  . potassium chloride  40 mEq Oral Q4H  . sodium chloride  1,000 mL Intravenous Once  . warfarin  7.5 mg Oral ONCE-1800  . warfarin   Does not apply Once  . DISCONTD: heparin subcutaneous  5,000 Units Subcutaneous Q8H  . DISCONTD: metoprolol  5 mg Intravenous Q6H  . DISCONTD: pantoprazole (PROTONIX) IV  40 mg Intravenous Q24H   Continuous Infusions:   . dextrose 5 % and 0.9 % NaCl with  KCl 20 mEq/L 20 mL/hr at 01/21/12 1641  . heparin 1,150 Units/hr (01/22/12 1549)   PRN Meds:.diphenhydrAMINE, diphenhydrAMINE, HYDROmorphone, iohexol, menthol-cetylpyridinium, naloxone, ondansetron (ZOFRAN) IV, ondansetron (ZOFRAN) IV, ondansetron, oxyCODONE-acetaminophen, promethazine, sodium chloride  Anti-infectives:  Anti-infectives     Start     Dose/Rate Route Frequency Ordered Stop   01/14/12 1600   piperacillin-tazobactam (ZOSYN) IVPB 3.375 g  Status:  Discontinued        3.375 g 12.5 mL/hr over 240 Minutes Intravenous Every 8 hours 01/14/12 1434 01/22/12 0807   01/14/12 0245   cefOXitin (MEFOXIN) 2 g in dextrose 5 % 50 mL IVPB        2 g 100 mL/hr over 30 Minutes Intravenous 60 min pre-op 01/14/12 0238 01/14/12 1020          Assessment/Plan: Principal Problem:  *Cancer of descending colon Active Problems:  Hypertension  Dyslipidemia  Acute  renal insufficiency  PE (pulmonary embolism)  Hypokalemia  Ileus, postoperative  ARF (acute renal failure)  Volume overload    01/23/12 - Hematology/Oncology Consulted for Hypercoagulable state with new diagnosis of colon cancer.   #1 bilateral pulmonary emboli right greater than left and bilateral deep vein thrombosis of LE.  Patient with colon cancer and likely hypercoagulable state. Consulted hematology/oncology.  Awaiting results of 2-D echo to rule out right ventricular strain. Patient is currently IV heparin and coumadin per pharmacy.  Goal INR is between 2-3. As this is patient's first episode will likely need to be on anticoagulations from anywhere from 6-9 months. Will follow.   #2 volume overload  Likely secondary to aggressive IV fluid hydration. Patient had been in renal failure postop and felt to be prerenal in nature and was hydrated aggressively with resolution of her acute renal failure. Patient's net I./oral during this hospitalization has been +37 L approximately. Will give patient Lasix 80 mg IV q 12 x 4 doses. Follow Is and Os and monitor renal function closely. Keep lower extremities elevated. 2-D echo has been completed and results are pending.   #3 dyspnea  Likely secondary to problems #1 and 2.   #4 hypertension  Stable. Continue metoprolol.   #5 hypokalemia  Secondary to diureses. Repleted, monitor and follow with diuretics.   #6 postop ileus  Resolved   #7 acute renal failure  Resolved.   #8 obstructing sigmoid colonic mass status post left colectomy with colostomy pathologic stage TIII, N0  Per primary team  #9  Anxiety - result of acute health care status.  #10.  Back pain - give k pad and ben gay for comfort  #11.  Dark liquid ostomy output - Hgb stable.  Per primary team.   LOS: 10 days   Stephani Police 01/23/2012, 8:31 AM [phone]

## 2012-01-23 NOTE — Progress Notes (Signed)
*  PRELIMINARY RESULTS* Echocardiogram 2D Echocardiogram has been performed.  Glean Salen [facility] 01/23/2012, 11:36 AM

## 2012-01-23 NOTE — Progress Notes (Signed)
Patient ID: Mallory Morales, female   DOB: 1956/09/06, 56 y.o.   MRN: [id number] 9 Days Post-Op  Subjective: Pt upset about multiple problems, cancer and now multiple blood clots in lungs and legs.  This is very understandable and we had a long discussion about why this possibly happened, etc.  She has no worsening of her dyspnea.  This is stable.  She is eating fairly well on her regular diet.  Objective: Vital signs in last 24 hours: Temp:  [98.1 F (36.7 C)-98.3 F (36.8 C)] 98.1 F (36.7 C) (02/01 0520) Pulse Rate:  [85-107] 99  (02/01 0520) Resp:  [18-20] 18  (02/01 0520) BP: (124-151)/(75-95) 138/86 mmHg (02/01 0520) SpO2:  [99 %-100 %] 100 % (02/01 0520) Last BM Date: 01/22/12  Intake/Output from previous day: 01/31 0701 - 02/01 0700 In: 514.5 [P.O.:480; I.V.:34.5] Out: 2900 [Urine:1925; Stool:975] Intake/Output this shift:    PE: Abd: soft, mildly tender, +BS, ostomy with air and stool.  Wound is clean with some serous drainage mostly.  VAC replaced. Heart: mildly tachy Lungs: decrease BS at bases  Lab Results:   [facility] 01/21/12 0525  WBC 8.5  HGB 9.9*  HCT 28.9*  PLT 229   BMET  Basename 01/22/12 0520 01/21/12 0525  NA 141 143  K 3.4* 3.8  CL 106 112  CO2 27 24  GLUCOSE 133* 166*  BUN 10 11  CREATININE 0.71 0.64  CALCIUM 8.3* 8.0*   PT/INR No results found for this basename: LABPROT:2,INR:2 in the last 72 hours   Studies/Results: Dg Chest 2 View  01/21/2012  *RADIOLOGY REPORT*  Clinical Data: Postop dyspnea  CHEST - 2 VIEW  Comparison: 01/13/2012  Findings: Heart size is normal.  There are bilateral pleural effusions left greater than right.  No interstitial edema.  Atelectasis is noted within both lung bases.  IMPRESSION:  1.  Bilateral pleural effusions, left greater than right.  Original Report Authenticated By: Rosealee Albee, M.D.   Ct Angio Chest W/cm &/or Wo Cm  01/22/2012  *RADIOLOGY REPORT*  Clinical Data: Shortness of breath.   Hypertension.  History colon cancer.  CT ANGIOGRAPHY CHEST  Technique:  Multidetector CT imaging of the chest using the standard protocol during bolus administration of intravenous contrast. Multiplanar reconstructed images including MIPs were obtained and reviewed to evaluate the vascular anatomy.  Contrast: 80mL OMNIPAQUE IOHEXOL 300 MG/ML IV SOLN  Comparison: Plain film of 1 day prior.  No prior chest CT.  The CT abdomen of 01/02/2012 is reviewed.  Findings: Lung windows demonstrate 4 mm right middle lobe lung nodule on image 60 of series 9.  Immediately inferior nodule measures 4 mm on image 63.  Bibasilar collapse / consolidative change. 2 mm right lower lobe nodule on image 35.  Soft tissue windows:  The quality of this exam for evaluation of pulmonary embolism is sufficient.  Moderate burden right greater than left pulmonary emboli.  Example right upper lobe image 44 series 11.  Right lower lobe image 47 series 11.  The pulmonary trunk is mildly dilated, 3.1 cm.  No right heart strain.  Normal aortic caliber without dissection.  Mild cardiomegaly with small left greater than right pleural effusions.  No mediastinal or hilar adenopathy.  Limited abdominal imaging demonstrates small volume upper abdominal ascites.  New since the prior exam.  No acute osseous abnormality.  IMPRESSION:  1.  Moderate burden right greater left pulmonary emboli.  Pulmonary artery enlargement suggests a component of pulmonary arterial hypertension. These results  will be called to the ordering clinician or representative by the Radiologist Assistant, and communication documented in the PACS Dashboard. 2.  Bilateral pleural effusions with bibasilar atelectasis versus infection.  Atelectasis is slightly favored. 3.  Small volume upper abdominal ascites, new since 01/02/2012.  Original Report Authenticated By: Consuello Bossier, M.D.    Anti-infectives: Anti-infectives     Start     Dose/Rate Route Frequency Ordered Stop   01/14/12  1600   piperacillin-tazobactam (ZOSYN) IVPB 3.375 g  Status:  Discontinued        3.375 g 12.5 mL/hr over 240 Minutes Intravenous Every 8 hours 01/14/12 1434 01/22/12 0807   01/14/12 0245   cefOXitin (MEFOXIN) 2 g in dextrose 5 % 50 mL IVPB        2 g 100 mL/hr over 30 Minutes Intravenous 60 min pre-op 01/14/12 0238 01/14/12 1020           Assessment/Plan  1. S/p colectomy/colostomy for adeno CA 2. Post-op ileus, resolved 3. Bilateral PEs 4. Bilateral common femoral blood clots, right calf DVT 5. ARF, resolved 6. Fluid overload, improving with lasix   Plan: 1. Appreciate hospitalist assistance in management of medical problems 2. Cont heparin and transition to coumadin 3. D/w Algis Downs, she will contact Heme/Onc for [place] today, also very appreciated. 4. Cont on a regular diet 5. Patient may ambulate 6. Will get a k-pad for her back discomfort 7. Check labs in the AM 8. Cont VAC while inpatient, will d/c at time of discharge  LOS: 10 days    Vinny Taranto E 01/23/2012

## 2012-01-23 NOTE — Progress Notes (Signed)
Patient seen and examined. I agree with assessments and care plan as outlined by [name].  Advancing diet uneventfully. Midline wound clean, Stoma healthy.  Starting on Coumadin  Harlie Buening M. Derrell Lolling, M.D., [facility] General and Minimally invasive Surgery Breast and Colorectal Surgery Office:   [phone] Pager:   [phone]

## 2012-01-23 NOTE — Progress Notes (Signed)
This office note has been dictated.

## 2012-01-23 NOTE — Progress Notes (Signed)
ANTICOAGULATION CONSULT NOTE - Follow Up Consult  Pharmacy Consult for heparin Indication: DVT/PE  Allergies  Allergen Reactions  . Strawberry Swelling    Patient Measurements: Height: 5\' 5"  (165.1 cm) Weight: 160 lb 9.6 oz (72.848 kg) IBW/kg (Calculated) : 57  Heparin Dosing Weight:   Vital Signs: Temp: 98.2 F (36.8 C) (02/01 1812) Temp src: Oral (02/01 1812) BP: 131/81 mmHg (02/01 1812) Pulse Rate: 96  (02/01 1812)  Labs:  Basename 01/23/12 1653 01/23/12 0903 01/23/12 0005 01/22/12 0520 01/21/12 0525  HGB -- 9.8* -- -- 9.9*  HCT -- 28.5* -- -- 28.9*  PLT -- 272 -- -- 229  APTT -- -- -- -- --  LABPROT -- 15.9* -- -- --  INR -- 1.24 -- -- --  HEPARINUNFRC 0.30 0.28* 0.40 -- --  CREATININE -- 0.49* -- 0.71 0.64  CKTOTAL -- -- -- -- --  CKMB -- -- -- -- --  TROPONINI -- -- -- -- --   Estimated Creatinine Clearance: 81.3 ml/min (by C-G formula based on Cr of 0.49).   Medications:  Scheduled:    . coumadin book   Does not apply Once  . diphenhydrAMINE  25 mg Oral QHS  . fondaparinux (ARIXTRA) injection  7.5 mg Subcutaneous Q24H  . furosemide  80 mg Intravenous Once  . furosemide  80 mg Intravenous Q12H  . metoprolol succinate  25 mg Oral Daily  . pantoprazole  40 mg Oral Q1200  . potassium chloride  40 mEq Oral Q4H  . sodium chloride  1,000 mL Intravenous Once  . warfarin  7.5 mg Oral ONCE-1800  . DISCONTD: warfarin  7.5 mg Oral ONCE-1800  . DISCONTD: warfarin   Does not apply Once   Infusions:    . dextrose 5 % and 0.9 % NaCl with KCl 20 mEq/L 20 mL/hr at 01/21/12 1641  . heparin 1,300 Units/hr (01/23/12 1100)    Assessment: 56 yo female with DVT/PE is currently on therapeutic heparin but barely. Heparin level 0.3.  MD d/c'ed coumadin and intends to switch to just Arixtra on Monday Goal of Therapy:  Heparin level 0.3-0.7 units/ml   Plan:  1) Increase heparin to 1400 units/hr (=30ml/hr).  Check a heparin level in 6 hours. 2) F/u on d/c'ing heparin  once Arixtra is started.  Nalla Purdy, Tsz-Yin 01/23/2012,6:37 PM

## 2012-01-23 NOTE — Progress Notes (Addendum)
ANTICOAGULATION CONSULT NOTE - Follow Up Consult  Pharmacy Consult for heparin, Coumadin Indication: pulmonary embolus  Allergies  Allergen Reactions  . Strawberry Swelling    Patient Measurements: Height: 5\' 5"  (165.1 cm) Weight: 160 lb 9.6 oz (72.848 kg) IBW/kg (Calculated) : 57   Vital Signs: Temp: 98 F (36.7 C) (02/01 1031) Temp src: Oral (02/01 1031) BP: 149/83 mmHg (02/01 1031) Pulse Rate: 91  (02/01 1031)  Labs:  Basename 01/23/12 0903 01/23/12 0005 01/22/12 0520 01/21/12 0525  HGB 9.8* -- -- 9.9*  HCT 28.5* -- -- 28.9*  PLT 272 -- -- 229  APTT -- -- -- --  LABPROT 15.9* -- -- --  INR 1.24 -- -- --  HEPARINUNFRC 0.28* 0.40 -- --  CREATININE 0.49* -- 0.71 0.64  CKTOTAL -- -- -- --  CKMB -- -- -- --  TROPONINI -- -- -- --   Estimated Creatinine Clearance: 81.3 ml/min (by C-G formula based on Cr of 0.49).   Assessment: Patient continues on Heparin and Coumadin for new PE. Heparin level is slightly below goal this AM. INR subtherapeutic, as expected s/p first Coumadin dose last PM. CBC stable.  Goal of Therapy:  Heparin level 0.3-0.7 units/ml INR 2-3   Plan:  1. Increase heparin infusion to 1300 units/hr.  2. Recheck heparin at 1700 today. 3. Coumadin 7.5mg  PO again today 4. Will f/up daily.  Torrie Lafavor K. Allena Katz, PharmD, BCPS.  Clinical Pharmacist Pager [phone]. 01/23/2012 11:44 AM

## 2012-01-23 NOTE — Progress Notes (Signed)
CC:   Adolph Pollack, M.D. Jordan Hawks Elnoria Howard, MD  REFERRING PHYSICIAN:  Adolph Pollack, M.D.  REASON FOR CONSULTATION: 1. 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 2. Pulmonary embolus/deep vein thrombosis.  HISTORY OF PRESENT ILLNESS:  Ms Meneely is a 56 year old African female. She lives in [place].  She has been in good health.  She has history of hypertension and hyperlipidemia.  She does have a history of hyperplastic polyps.  She has been followed by Dr. Noe Gens in [facility].  She presented to the emergency room in early January.  She had progressive abdominal pain.  She underwent a CT scan of the abdomen and pelvis.  This showed some proximal colonic thickening.  She was initially seen by Dr. Jeani Hawking as an outpatient. Unfortunately, she could not tolerate any colonic prep for colonoscopy. A flexible sigmoidoscopy was performed.  She had a large, hard, obstructing sigmoid lesion.  Biopsies were obtained.  I suspect those biopsies were sent out for evaluation.  She then was seen by  doctor Dr. Abbey Chatters.  He went ahead and performed exploratory laparotomy.  A left colectomy was done.  A temporary colostomy was made.  According to the operative report, there was no obvious metastatic disease.  The stoma was made without difficulties.  The pathology report ([id number]) shows a moderate to poorly- differentiated adenocarcinoma.  There was no lymphovascular space invasion.  The tumor invaded into the pericolonic tissue.  Margins were all negative.  There were 36 lymph nodes that were all negative.  She subsequently was staged as a stage II (T3 N0 M0) adenocarcinoma.  The tumor displayed patchy mucinous and signet cell differentiation.  She did, unfortunately, develop postoperative DVT and PE.  She had a CT angio of the chest yesterday.  This did show bilateral pulmonary emboli. She did have I think Dopplers done.  She was noted to have I  think bilateral DVT of common femoral vein.  Lab work that was done shows that upon I think at admission, her CBC looked okay.  She had a normal electrolytes.  She had normal LFTs.  She did have hypercoagulable studies done.  So far, all the tests have come back normal.  She is currently on heparin.  I do not see a preop CEA that was done.  We are asked to see her to help with postop management and for consideration of adjuvant chemotherapy.  She certainly does have a strong faith.  She of feels fairly well.  She says her breathing is doing better.  PAST MEDICAL HISTORY: 1. Hypertension. 2. Hyperlipidemia.  ALLERGIES:  None.  ADMISSION MEDICATIONS:  Zocor 40 mg daily, hydrochlorothiazide 25 mg daily.  SOCIAL HISTORY:  Negative for tobacco use.  She does have social alcohol use.  FAMILY HISTORY:  Remarkable for a brother who died of pancreatic cancer. A Sister died of stomach cancer.  As far she knows, there is no history of blood clots in the family.  PHYSICAL EXAMINATION:  This is a well-developed, well-nourished black female in no obvious distress.  Vital signs:  Temperature of 97.3, pulse 83, respiratory rate 22, blood pressure 126/82.  Head and neck:  Shows no ocular or oral lesions.  There are no palpable cervical or supraclavicular lymph nodes.  Lungs:  Clear bilaterally.  Cardiac: Regular rate and rhythm with a normal S1 and S2.  There are no murmurs, rubs, or bruits.  Abdomen:  soft.  Her colostomy is intact.  She has  healing abdominal wounds.  She has decent bowel sounds.  There is no palpable hepatomegaly.  Extremities:  No clubbing, cyanosis or edema. She has good range motion of her joints.  Neurologic:  No focal neurological deficits.  IMPRESSION:  Ms. Reddinger is a very nice 56 year old African female with stage II adenocarcinoma of the colon.  She underwent a colectomy.  She had 36 lymph nodes negative.  One would have to think that Ms. Alligood may not  need adjuvant chemotherapy.  However, looking at the pathology report, she does have some mucinous and signet cell differentiation.  This may portend a higher risk.  In addition, it sounds like she had an obstructing tumor, which also would reflect a higher rate of recurrence.  I think that I will send off her tumor for Oncotype assay evaluation. This may help [place] with respect to determining her risk of recurrence.  The blood clots certainly are troublesome to me.  I believe that studies have shown that thromboembolic events associated with colonic malignancies also indicate a higher risk of recurrence.  As far as her thromboembolic disease, I would not put her on Coumadin. I believe Coumadin in the setting of malignancy-associated deep vein thromboses is not that effective.  I would put her on outpatient Arixtra, which is daily dosing.  I will get a CEA level on her.  I am sure one has to have been done as a preop.  I just have not been able to find it in the system as of yet.  We will help follow Ms. Whitsell along.  The nice thing is that she does live out in [place], which is close to our office.    ______________________________ Josph Macho, M.D. PRE/MEDQ  D:  01/23/2012  T:  01/23/2012  Job:  [id number]

## 2012-01-23 NOTE — Consult Note (Signed)
[facility] CONSULTATION NOTE  Reason for Consult: New diagnosis of Colon Cancer                                    Pulmonary Emboli/ DVT   WUJ:WJXBJYN Mallory Morales is an 56 y.o. female.  With know h/o hyperplastic colon polyps per colonoscopy, removed by Dr. Noe Gens in [facility] in 06/20/09 at 15 cm.   No adenomas were detected.  She was in her usual state of health until July of 2012 when she began to develop L sided abdominal pain , progressing to severe around early January when she sought medical evaluation at the ED. CT A/P 1/11 with contrast showed thickening at the proximal sigmoid colon without discrete lesion identified. Pt was seen by Dr. 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 Multiple cold biopsies were obtained and the site was tattooed with SPOT. As a result of the findings was directly admitted to the hospital for further evaluation and treatment. Surgery was contacted on admission, and exploratory laparotomy, left colectomy with mobilization of splenic flexure, colostomy was performed on 1/23 by Dr. Abbey Chatters, with pathology positive for T3N0 adenocarcinoma.Post op ileus was quickly resolved. Status was complicated with SOB, bilateral LE edema, which prompted CT angio on 1/31 demonstrating right greater left pulmonary emboli and pulmonary artery enlargement suggest a component of pulmonary hypertension,despite  Sq anticoagulation in hospital. Dopplers on  1/31 were positive for B partial DVT of the common femoral veins, with thrombus on R calf veins.  Pt was placed on IV heparin, with coumadin starting 1/31. We were requested to see patient regarding these diagnoses.   PMH: Past Medical History  Diagnosis Date  . Hypertension   . Hyperlipemia     Surgeries: Past Surgical History    Procedure Date  . Abdominal hysterectomy   . Hernia repair Age 20  . Colon resection 01/14/2012    Procedure: COLON RESECTION;  Surgeon: Adolph Pollack, MD;  Location: [facility] OR;  Service: General;  Laterality: N/A;  left colectomy, mobilization of splenic flexure, ostomy.    Allergies:  Allergies  Allergen Reactions  . Strawberry Swelling    Medications:  Prior to Admission:  Prescriptions prior to admission  Medication Sig Dispense Refill  . acetaminophen (TYLENOL) 500 MG tablet Take 500 mg by mouth every 6 (six) hours as needed. For pain      . hydrochlorothiazide (HYDRODIURIL) 25 MG tablet Take 25 mg by mouth daily.      Marland Kitchen HYDROcodone-acetaminophen (NORCO) 5-325 MG per tablet Take 2 tablets by mouth every 4 (four) hours as needed. For pain      . simvastatin (ZOCOR) 40 MG tablet Take 40 mg by mouth daily.      . ciprofloxacin (CIPRO) 500 MG tablet Take 1 tablet (500 mg total) by mouth 2 (two) times daily.  20 tablet  0  . HYDROcodone-acetaminophen (NORCO) 5-325 MG per tablet Take 2 tablets by mouth every 4 (four) hours as needed for pain.  20 tablet  0  . metroNIDAZOLE (FLAGYL) 500 MG tablet Take 1 tablet (500 mg total) by mouth 2 (two) times daily.  20 tablet  0    WGN:FAOZHYQMVHQIONG, diphenhydrAMINE, HYDROmorphone, menthol-cetylpyridinium, naloxone, ondansetron (ZOFRAN) IV, ondansetron (  ZOFRAN) IV, ondansetron, oxyCODONE-acetaminophen, promethazine, sodium chloride  ROS: Constitutional: Positive for 10-12 pound weight loss since July 2012.Negative for fever, chills and malaise/fatigue.  Eyes: Negative for blurred vision and double vision.  Respiratory: Negative for cough, hemoptysis and shortness of breath.  Cardiovascular: Negative for chest pain. GI: No nausea at this time, but intermittent on admission, no vomiting at this time,  No diarrhea, had constipation prior to admission constipation. No change in bowel caliber. No  Melena or hematochezia.  She did have guaiac  positive 2 weeks prior to admission LLQ pain Mallory/w tumor on admission.  GU: No blood in urine. No loss of urinary control. Skin: Negative for itching. No rash. No petechia. No bruising.  Neurological: No headaches. No motor or sensory deficits.  Family History: Father deceased 9 CHF. Mo alive, HTN, 1 brother died with pancreatic Ca and 1 sister in good health.One sister had? liver disease.  No family history of clotting disorder.  Social History:  reports that she has never smoked. She has quit using smokeless tobacco. She reports that she does not use illicit drugs. Drinks about 4 glasses of wine a day. No recent trips. Patient is married. No biological children by choice. Works as Environmental health practitioner. Up to date with her mammogramms and Gyn exams.  Physical Exam  56 year old  in no acute distress A. and O. x3 General well-developed and well-nourished  HEENT: Normocephalic, atraumatic, PERRLA. Oral cavity without thrush or lesions. Neck supple. no thyromegaly, no cervical or supraclavicular adenopathy  Lungs decreased breath sounds at bases. No wheezing, rhonchi or rales. No axillary masses. Breasts: not examined. Cardiac regular rate and rhythm normal S1-S2, 1/6 systolic murmur , no  rubs or gallops Abdomen soft, mild tenderness , bowel sounds x4. No HSM.  Ostomy. Abdominal wound as per WOC.on VAC GU/rectal: deferred. Extremities no clubbing cyanosis . mimimal tense edema L upper thigh. No bruising or petechial rash     Labs:  CBC   Lab 01/23/12 0903 01/21/12 0525 01/19/12 0645 01/18/12 0525 01/17/12 0645  WBC 11.3* 8.5 9.0 9.7 13.4*  HGB 9.8* 9.9* 9.4* 9.4* 9.8*  HCT 28.5* 28.9* 27.7* 27.6* 28.7*  PLT 272 229 166 143* 123*  MCV 84.8 86.3 85.8 86.3 86.2  MCH 29.2 29.6 29.1 29.4 29.4  MCHC 34.4 34.3 33.9 34.1 34.1  RDW 13.4 13.6 13.3 13.4 13.2  LYMPHSABS -- -- -- -- --  MONOABS -- -- -- -- --  EOSABS -- -- -- -- --  BASOSABS -- -- -- -- --  BANDABS -- -- -- -- --      Anemia panel:  No results found for this basename: VITAMINB12:2,FOLATE:2,FERRITIN:2,TIBC:2,IRON:2,RETICCTPCT:2 in the last 72 hours  No results found for this basename: TSH,T4TOTAL,FREET3,T3FREE,THYROIDAB in the last 72 hours   No results found for this basename: esrsedrate      CMP    Lab 01/23/12 0903 01/22/12 0520 01/21/12 0525 01/20/12 0605 01/19/12 0645  NA 140 141 143 145 146*  K 3.4* 3.4* 3.8 3.7 4.0  CL 103 106 112 111 111  CO2 24 27 24 28 28   GLUCOSE 111* 133* 166* 169* 181*  BUN 8 10 11 9 6   CREATININE 0.49* 0.71 0.64 0.82 0.67  CALCIUM 8.4 8.3* 8.0* 8.3* 8.2*  MG -- -- -- -- --  AST -- -- -- -- 34  ALT -- -- -- -- 17  ALKPHOS -- -- -- -- 76  BILITOT -- -- -- -- 1.2  Component Value Date/Time   BILITOT 1.2 01/19/2012 0645       Lab 01/23/12 0903  INR 1.24  PROTIME --    No results found for this basename: DDIMER:2 in the last 72 hours    Imaging Studies:  Ct Angio Chest W/cm &/or Wo Cm  01/22/2012  *RADIOLOGY REPORT*  Clinical Data: Shortness of breath.  Hypertension.  History colon cancer.  CT ANGIOGRAPHY CHEST  Technique:  Multidetector CT imaging of the chest using the standard protocol during bolus administration of intravenous contrast. Multiplanar reconstructed images including MIPs were obtained and reviewed to evaluate the vascular anatomy.  Contrast: 80mL OMNIPAQUE IOHEXOL 300 MG/ML IV SOLN  Comparison: Plain film of 1 day prior.  No prior chest CT.  The CT abdomen of 01/02/2012 is reviewed.  Findings: Lung windows demonstrate 4 mm right middle lobe lung nodule on image 60 of series 9.  Immediately inferior nodule measures 4 mm on image 63.  Bibasilar collapse / consolidative change. 2 mm right lower lobe nodule on image 35.  Soft tissue windows:  The quality of this exam for evaluation of pulmonary embolism is sufficient.  Moderate burden right greater than left pulmonary emboli.  Example right upper lobe image 44 series 11.  Right  lower lobe image 47 series 11.  The pulmonary trunk is mildly dilated, 3.1 cm.  No right heart strain.  Normal aortic caliber without dissection.  Mild cardiomegaly with small left greater than right pleural effusions.  No mediastinal or hilar adenopathy.  Limited abdominal imaging demonstrates small volume upper abdominal ascites.  New since the prior exam.  No acute osseous abnormality.  IMPRESSION:  1.  Moderate burden right greater left pulmonary emboli.  Pulmonary artery enlargement suggests a component of pulmonary arterial hypertension. These results will be called to the ordering clinician or representative by the Radiologist Assistant, and communication documented in the PACS Dashboard. 2.  Bilateral pleural effusions with bibasilar atelectasis versus infection.  Atelectasis is slightly favored. 3.  Small volume upper abdominal ascites, new since 01/02/2012.  Original Report Authenticated By: Consuello Bossier, M.D.     Ct Abdomen Pelvis W Contrast  01/02/2012 *RADIOLOGY REPORT* Clinical Data: Left lower quadrant right lower quadrant abdominal pain for 1 month, history hypertension, hysterectomy CT ABDOMEN AND PELVIS WITH CONTRAST Technique: Multidetector CT imaging of the abdomen and pelvis was performed following the standard protocol during bolus administration of intravenous contrast. Sagittal and coronal MPR images reconstructed from axial data set. Contrast: OMNIPAQUE IOHEXOL 300 MG/ML IV SOLN. Dilute oral contrast. Comparison: None Findings: Mild dependent atelectasis at lung bases. Liver, spleen, pancreas, kidneys, and adrenal glands normal. Stomach and small bowel loops unremarkable. Small amount nonspecific free pelvic fluid. Uterus surgically absent with unremarkable ovaries. Normal appendix. Mild wall thickening of distal descending and proximal sigmoid colon. Minimal pericolic edema posterolateral to the thickened segment. Colon proximal to this is slightly dilated without additional  wall thickening. Decompression of distal sigmoid colon and rectum. No mass, adenopathy, or hernia. No acute osseous findings. Degenerative disc disease changes L5-S1. IMPRESSION: Segmental wall thickening of colon at distal descending/sigmoid colon junction with minimal infiltration of pericolic fat. This could represent a focal inflammatory process such as segmental colitis, acute diverticulitis (though no definite colonic diverticula identified), or neoplasm. Colonoscopy recommended. The presence of mild distention of the colon proximal to the area of wall thickening raises a question of partial obstruction. Original Report Authenticated By: Lollie Marrow, M.D.   1.  Colitis  2.  Abdominal pain       A/P: 56 y.o. female asked to see for evaluation of T3N0 Adenocarcinoma, complicated during this hospitalization with PE/ DVT requiring anticoagulation (coag studies in progress). Dr. Myna Hidalgo is to see the patient following this consult with recommendations regarding diagnosis, treatment options and further workup studies.  Thank you for the referral.  [facility] E 01/23/2012 1:06 PM

## 2012-01-24 ENCOUNTER — Inpatient Hospital Stay (HOSPITAL_COMMUNITY): Payer: 59

## 2012-01-24 LAB — BASIC METABOLIC PANEL
CO2: 27 mEq/L (ref 19–32)
Calcium: 8.8 mg/dL (ref 8.4–10.5)
Creatinine, Ser: 0.63 mg/dL (ref 0.50–1.10)
Glucose, Bld: 111 mg/dL — ABNORMAL HIGH (ref 70–99)

## 2012-01-24 LAB — CBC
Hemoglobin: 9.6 g/dL — ABNORMAL LOW (ref 12.0–15.0)
MCH: 29.1 pg (ref 26.0–34.0)
MCV: 85.2 fL (ref 78.0–100.0)
RBC: 3.3 MIL/uL — ABNORMAL LOW (ref 3.87–5.11)

## 2012-01-24 LAB — HEPARIN LEVEL (UNFRACTIONATED)
Heparin Unfractionated: 0.22 IU/mL — ABNORMAL LOW (ref 0.30–0.70)
Heparin Unfractionated: 0.45 IU/mL (ref 0.30–0.70)
Heparin Unfractionated: 0.46 IU/mL (ref 0.30–0.70)

## 2012-01-24 MED ORDER — HEPARIN SOD (PORCINE) IN D5W 100 UNIT/ML IV SOLN
1700.0000 [IU]/h | INTRAVENOUS | Status: AC
  Administered 2012-01-24 – 2012-01-26 (×4): 1700 [IU]/h via INTRAVENOUS
  Filled 2012-01-24 (×2): qty 250

## 2012-01-24 MED ORDER — HEPARIN BOLUS VIA INFUSION
1000.0000 [IU] | Freq: Once | INTRAVENOUS | Status: AC
  Administered 2012-01-24: 1000 [IU] via INTRAVENOUS
  Filled 2012-01-24: qty 1000

## 2012-01-24 NOTE — Progress Notes (Signed)
ANTICOAGULATION CONSULT NOTE - Follow Up Consult  Pharmacy Consult for Heparni Indication: PE/DVT  Allergies  Allergen Reactions  . Strawberry Swelling    Patient Measurements: Height: 5\' 5"  (165.1 cm) Weight: 160 lb 9.6 oz (72.848 kg) IBW/kg (Calculated) : 57  Heparin Dosing Weight: 72kg  Vital Signs: Temp: 98.1 F (36.7 C) (02/02 0554) BP: 131/83 mmHg (02/02 0554) Pulse Rate: 92  (02/02 0554)  Labs:  Basename 01/24/12 0955 01/24/12 0645 01/24/12 0109 01/23/12 1653 01/23/12 0903 01/22/12 0520  HGB -- 9.6* -- -- 9.8* --  HCT -- 28.1* -- -- 28.5* --  PLT -- 287 -- -- 272 --  APTT -- -- -- -- -- --  LABPROT -- -- -- -- 15.9* --  INR -- -- -- -- 1.24 --  HEPARINUNFRC 0.22* -- 0.31 0.30 -- --  CREATININE -- 0.63 -- -- 0.49* 0.71  CKTOTAL -- -- -- -- -- --  CKMB -- -- -- -- -- --  TROPONINI -- -- -- -- -- --   Estimated Creatinine Clearance: 81.3 ml/min (by C-G formula based on Cr of 0.63).   Medications:  Heparin 1500 units/hr  Assessment: 55yof on heparin for PE/DVT. Heparin level (0.22) is subtherapeutic despite recent rate increases. No problems with IV line per RN. Coumadin has been dc'd and MD plans to switch to just Arixtra on Monday (orders already in [facility]). - H/H and Plts wnl - No significant bleeding reported  Goal of Therapy:  Heparin level 0.3-0.7 units/ml   Plan:  1. Heparin IV bolus 1000 units x 1 2. Increase heparin drip to 1700 units/hr (96ml/hr) 3. Check 6hr heparin level (~1800)  Cleon Dew [phone] 01/24/2012,11:45 AM

## 2012-01-24 NOTE — Progress Notes (Signed)
ANTICOAGULATION CONSULT NOTE - Follow Up Consult  Pharmacy Consult for Heparni Indication: PE/DVT  Allergies  Allergen Reactions  . Strawberry Swelling    Patient Measurements: Height: 5\' 5"  (165.1 cm) Weight: 160 lb 9.6 oz (72.848 kg) IBW/kg (Calculated) : 57  Heparin Dosing Weight: 72kg  Vital Signs: Temp: 97.9 F (36.6 C) (02/02 1300) Temp src: Oral (02/02 1300) BP: 134/79 mmHg (02/02 1300) Pulse Rate: 94  (02/02 1300)  Labs:  Basename 01/24/12 1730 01/24/12 0955 01/24/12 0645 01/24/12 0109 01/23/12 0903 01/22/12 0520  HGB -- -- 9.6* -- 9.8* --  HCT -- -- 28.1* -- 28.5* --  PLT -- -- 287 -- 272 --  APTT -- -- -- -- -- --  LABPROT -- -- -- -- 15.9* --  INR -- -- -- -- 1.24 --  HEPARINUNFRC 0.45 0.22* -- 0.31 -- --  CREATININE -- -- 0.63 -- 0.49* 0.71  CKTOTAL -- -- -- -- -- --  CKMB -- -- -- -- -- --  TROPONINI -- -- -- -- -- --   Estimated Creatinine Clearance: 81.3 ml/min (by C-G formula based on Cr of 0.63).   Medications:  Heparin 1500 units/hr  Assessment: 55yof on heparin for PE/DVT. Heparin level (0.45). Coumadin has been dc'd and MD plans to switch to just Arixtra on Monday (orders already in [facility]).  Goal of Therapy:  Heparin level 0.3-0.7 units/ml   Plan:  1. Continue IV heparin at 1700 units/hr 2. Recheck heparin level in 6 hrs per protocol.  Misty Stanley Stillinger 01/24/2012,7:09 PM

## 2012-01-24 NOTE — Progress Notes (Signed)
I have seen and assessed patient and agree with Algis Downs, NP assessment and plan.

## 2012-01-24 NOTE — Progress Notes (Signed)
ANTICOAGULATION CONSULT NOTE - Follow Up Consult  Pharmacy Consult for heparin Indication: DVT/PE  Allergies  Allergen Reactions  . Strawberry Swelling    Patient Measurements: Height: 5\' 5"  (165.1 cm) Weight: 160 lb 9.6 oz (72.848 kg) IBW/kg (Calculated) : 57   Vital Signs: Temp: 98.6 F (37 C) (02/02 0205) Temp src: Oral (02/01 2013) BP: 127/76 mmHg (02/02 0205) Pulse Rate: 89  (02/02 0205)  Labs:  Basename 01/24/12 0109 01/23/12 1653 01/23/12 0903 01/22/12 0520 01/21/12 0525  HGB -- -- 9.8* -- 9.9*  HCT -- -- 28.5* -- 28.9*  PLT -- -- 272 -- 229  APTT -- -- -- -- --  LABPROT -- -- 15.9* -- --  INR -- -- 1.24 -- --  HEPARINUNFRC 0.31 0.30 0.28* -- --  CREATININE -- -- 0.49* 0.71 0.64  CKTOTAL -- -- -- -- --  CKMB -- -- -- -- --  TROPONINI -- -- -- -- --   Estimated Creatinine Clearance: 81.3 ml/min (by C-G formula based on Cr of 0.49).  Infusions:     . dextrose 5 % and 0.9 % NaCl with KCl 20 mEq/L 20 mL/hr at 01/21/12 1641  . heparin 1,400 Units/hr (01/23/12 1938)    Assessment: 56 yo female with DVT/PE is currently on therapeutic heparin but barely. Heparin level 0.31.  MD d/c'ed coumadin and intends to switch to just Arixtra on Monday.  Goal of Therapy:  Heparin level 0.3-0.7 units/ml   Plan:  1) Increase heparin to 1500 units/hr (=60ml/hr).  F/u a.m. level 2) F/u on d/c'ing heparin once Arixtra is started on Monday - order already in Epic for Arixtra to start Monday at 1000.  Lavonia Dana, PharmD, BCPS Clinical Pharmacist, pager [phone] 01/24/2012,2:26 AM

## 2012-01-24 NOTE — Progress Notes (Signed)
Patient ID: TINSLEY EVERMAN, female   DOB: 1956/11/21, 56 y.o.   MRN: [id number] Patient ID: CYNCERE RUHE, female   DOB: 08/27/1956, 56 y.o.   MRN: [id number] Subjective: Complaining of back pain more on the left than right - believes it is from the bed.  SOB improved.  Objective: Weight change:   Intake/Output Summary (Last 24 hours) at 01/24/12 1552 Last data filed at 01/24/12 1305  Gross per 24 hour  Intake    240 ml  Output   2275 ml  Net  -2035 ml   Blood pressure 134/79, pulse 94, temperature 97.9 F (36.6 C), temperature source Oral, resp. rate 20, height 5\' 5"  (1.651 m), weight 72.848 kg (160 lb 9.6 oz), SpO2 100.00%. Filed Vitals:   01/24/12 0554 01/24/12 0800 01/24/12 1205 01/24/12 1300  BP: 131/83 124/80 123/75 134/79  Pulse: 92 95 97 94  Temp: 98.1 F (36.7 C) 97.7 F (36.5 C) 98.5 F (36.9 C) 97.9 F (36.6 C)  TempSrc:  Oral Oral Oral  Resp: 20 18 20 20   Height:      Weight:      SpO2: 100% 100% 100% 100%    Physical Exam: General: Well-developed well-nourished in no acute cardiopulmonary distress. Appears slightly anxious Heart: Regular rate and rhythm, without murmurs, rubs, gallops.  Lungs: Clear to auscultation bilaterally. Decreased BS in bases. Abdomen: Soft, nontender, nondistended, positive bowel sounds. Ostomy bag with black liquid.  Wound clean and dry. Extremities: No clubbing cyanosis. 1 -2 + bilateral lower extremity edema, with positive pedal pulses.  Neuro: Alert and onto x3. Cranial nerves II through XII are grossly intact. Sensation is intact. 5 out of 5 bilateral upper extremity strength. 5 out of 5 bilateral lower extremity strength. Visual fields intact. Gait not tested secondary to safety   Basic Metabolic Panel:  Lab 01/24/12 6295 01/23/12 0903  NA 139 140  K 3.6 3.4*  CL 103 103  CO2 27 24  GLUCOSE 111* 111*  BUN 7 8  CREATININE 0.63 0.49*  CALCIUM 8.8 8.4  MG -- --  PHOS -- --   Liver Function Tests:  Lab 01/19/12 0645    AST 34  ALT 17  ALKPHOS 76  BILITOT 1.2  PROT 5.0*  ALBUMIN 1.7*   CBC:  Lab 01/24/12 0645 01/23/12 0903  WBC 10.9* 11.3*  NEUTROABS -- --  HGB 9.6* 9.8*  HCT 28.1* 28.5*  MCV 85.2 84.8  PLT 287 272    Micro Results: No results found for this or any previous visit (from the past 240 hour(s)).  Studies/Results: Scheduled Meds:    . diphenhydrAMINE  25 mg Oral QHS  . fondaparinux (ARIXTRA) injection  7.5 mg Subcutaneous Q24H  . furosemide  80 mg Intravenous Q12H  . heparin  1,000 Units Intravenous Once  . metoprolol succinate  25 mg Oral Daily  . pantoprazole  40 mg Oral Q1200  . sodium chloride  1,000 mL Intravenous Once  . DISCONTD: warfarin  7.5 mg Oral ONCE-1800  . DISCONTD: warfarin   Does not apply Once   Continuous Infusions:    . dextrose 5 % and 0.9 % NaCl with KCl 20 mEq/L 20 mL/hr at 01/21/12 1641  . heparin 1,700 Units/hr (01/24/12 1208)  . DISCONTD: heparin 1,500 Units/hr (01/24/12 0535)   PRN Meds:.diphenhydrAMINE, diphenhydrAMINE, HYDROmorphone, menthol-cetylpyridinium, Muscle Rub, naloxone, ondansetron (ZOFRAN) IV, ondansetron (ZOFRAN) IV, ondansetron, oxyCODONE-acetaminophen, promethazine, sodium chloride  Anti-infectives:  Anti-infectives     Start     Dose/Rate  Route Frequency Ordered Stop   01/14/12 1600   piperacillin-tazobactam (ZOSYN) IVPB 3.375 g  Status:  Discontinued        3.375 g 12.5 mL/hr over 240 Minutes Intravenous Every 8 hours 01/14/12 1434 01/22/12 0807   01/14/12 0245   cefOXitin (MEFOXIN) 2 g in dextrose 5 % 50 mL IVPB        2 g 100 mL/hr over 30 Minutes Intravenous 60 min pre-op 01/14/12 0238 01/14/12 1020          Assessment/Plan: Principal Problem:  *Cancer of descending colon Active Problems:  Hypertension  Dyslipidemia  Acute renal insufficiency  PE (pulmonary embolism)  Hypokalemia  Ileus, postoperative  ARF (acute renal failure)  Volume overload  DVT (deep venous thrombosis)  Femoral DVT (deep venous  thrombosis)    01/23/12 - Hematology/Oncology Consulted for Hypercoagulable state with new diagnosis of colon cancer.   #1 bilateral pulmonary emboli right greater than left and bilateral deep vein thrombosis of LE.  Patient with colon cancer and likely hypercoagulable state. Consulted hematology/oncology.  2-D echo negative for right ventricular strain. Patient is currently IV heparin. Coumadin d/c per heme/onc and patient started on arixta per oncology. Hypercoagulable panel pending.   #2 volume overload  Likely secondary to aggressive IV fluid hydration. Patient had been in renal failure postop and felt to be prerenal in nature and was hydrated aggressively with resolution of her acute renal failure. Patient's net I./oral during this hospitalization has been +37 L approximately. Improving with diureses. Continue IV lasix.Follow Is and Os and monitor renal function closely. Keep lower extremities elevated.   #3 dyspnea  Likely secondary to problems #1 and 2. Improving.  #4 hypertension  Stable. Continue metoprolol.   #5 hypokalemia  Secondary to diureses. Repleted, monitor and follow with diuretics.   #6 postop ileus  Resolved   #7 acute renal failure  Resolved.   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 Oncology ff.  #9  Anxiety - result of acute health care status.  #10.  Back pain - give k pad and bengay for comfort.  #11.  Dark liquid ostomy output - Hgb stable.  Per primary team.   LOS: 11 days   THOMPSON,DANIEL 01/24/2012, 3:52 PM [phone]

## 2012-01-24 NOTE — Progress Notes (Signed)
10 Days Post-Op  Subjective: Feels good and that she is making progress. Tolerating solid diet, bowels working, minimal pain  Objective: Vital signs in last 24 hours: Temp:  [97.3 F (36.3 C)-98.6 F (37 C)] 98.1 F (36.7 C) (02/02 0554) Pulse Rate:  [79-96] 92  (02/02 0554) Resp:  [18-22] 20  (02/02 0554) BP: (125-155)/(75-94) 131/83 mmHg (02/02 0554) SpO2:  [100 %] 100 % (02/02 0554) Last BM Date: 01/23/12  Intake/Output from previous day: 02/01 0701 - 02/02 0700 In: 720 [P.O.:720] Out: 2720 [Urine:2245; Stool:475] Intake/Output this shift:    General appearance: alert, cooperative and no distress Resp: clear to auscultation bilaterally Cardio: regular rate and rhythm, S1, S2 normal, no murmur, click, rub or gallop GI: soft, non-tender; bowel sounds normal; no masses,  no organomegaly and Ostomy healthy and functioning  Lab Results:   [facility] 01/24/12 0645 01/23/12 0903  WBC 10.9* 11.3*  HGB 9.6* 9.8*  HCT 28.1* 28.5*  PLT 287 272   BMET  Basename 01/24/12 0645 01/23/12 0903  NA 139 140  K 3.6 3.4*  CL 103 103  CO2 27 24  GLUCOSE 111* 111*  BUN 7 8  CREATININE 0.63 0.49*  CALCIUM 8.8 8.4   PT/INR  Basename 01/23/12 0903  LABPROT 15.9*  INR 1.24   ABG No results found for this basename: PHART:2,PCO2:2,PO2:2,HCO3:2 in the last 72 hours  Studies/Results: Ct Angio Chest W/cm &/or Wo Cm  01/22/2012  *RADIOLOGY REPORT*  Clinical Data: Shortness of breath.  Hypertension.  History colon cancer.  CT ANGIOGRAPHY CHEST  Technique:  Multidetector CT imaging of the chest using the standard protocol during bolus administration of intravenous contrast. Multiplanar reconstructed images including MIPs were obtained and reviewed to evaluate the vascular anatomy.  Contrast: 80mL OMNIPAQUE IOHEXOL 300 MG/ML IV SOLN  Comparison: Plain film of 1 day prior.  No prior chest CT.  The CT abdomen of 01/02/2012 is reviewed.  Findings: Lung windows demonstrate 4 mm right middle lobe  lung nodule on image 60 of series 9.  Immediately inferior nodule measures 4 mm on image 63.  Bibasilar collapse / consolidative change. 2 mm right lower lobe nodule on image 35.  Soft tissue windows:  The quality of this exam for evaluation of pulmonary embolism is sufficient.  Moderate burden right greater than left pulmonary emboli.  Example right upper lobe image 44 series 11.  Right lower lobe image 47 series 11.  The pulmonary trunk is mildly dilated, 3.1 cm.  No right heart strain.  Normal aortic caliber without dissection.  Mild cardiomegaly with small left greater than right pleural effusions.  No mediastinal or hilar adenopathy.  Limited abdominal imaging demonstrates small volume upper abdominal ascites.  New since the prior exam.  No acute osseous abnormality.  IMPRESSION:  1.  Moderate burden right greater left pulmonary emboli.  Pulmonary artery enlargement suggests a component of pulmonary arterial hypertension. These results will be called to the ordering clinician or representative by the Radiologist Assistant, and communication documented in the PACS Dashboard. 2.  Bilateral pleural effusions with bibasilar atelectasis versus infection.  Atelectasis is slightly favored. 3.  Small volume upper abdominal ascites, new since 01/02/2012.  Original Report Authenticated By: Consuello Bossier, M.D.    Anti-infectives: Anti-infectives     Start     Dose/Rate Route Frequency Ordered Stop   01/14/12 1600   piperacillin-tazobactam (ZOSYN) IVPB 3.375 g  Status:  Discontinued        3.375 g 12.5 mL/hr over 240  Minutes Intravenous Every 8 hours 01/14/12 1434 01/22/12 0807   01/14/12 0245   cefOXitin (MEFOXIN) 2 g in dextrose 5 % 50 mL IVPB        2 g 100 mL/hr over 30 Minutes Intravenous 60 min pre-op 01/14/12 0238 01/14/12 1020          Assessment/Plan: s/p Procedure(s): COLON RESECTION DVT Should be able to go home soon from surgery standpoint. Still has wound vac.  Anticoagulant management  per pham. Oncology want on Arista and not Coumadin  LOS: 11 days    Angeliah Wisdom J 01/24/2012

## 2012-01-25 LAB — BASIC METABOLIC PANEL
Calcium: 8.8 mg/dL (ref 8.4–10.5)
GFR calc non Af Amer: >90 mL/min (ref >90)
Glucose, Bld: 103 mg/dL — ABNORMAL HIGH (ref 70–99)
Sodium: 137 mEq/L (ref 135–145)

## 2012-01-25 LAB — CBC
MCHC: 34.5 g/dL (ref 30.0–36.0)
RDW: 13.7 % (ref 11.5–15.5)

## 2012-01-25 LAB — IRON AND TIBC
Saturation Ratios: 15 % — ABNORMAL LOW (ref 20–55)
UIBC: 180 ug/dL (ref 125–400)

## 2012-01-25 LAB — CEA: CEA: 2.8 ng/mL (ref 0.0–5.0)

## 2012-01-25 LAB — HEPARIN LEVEL (UNFRACTIONATED): Heparin Unfractionated: 0.54 IU/mL (ref 0.30–0.70)

## 2012-01-25 MED ORDER — FUROSEMIDE 10 MG/ML IJ SOLN
80.0000 mg | Freq: Once | INTRAMUSCULAR | Status: AC
  Administered 2012-01-25: 80 mg via INTRAVENOUS
  Filled 2012-01-25 (×2): qty 8

## 2012-01-25 MED ORDER — POTASSIUM CHLORIDE CRYS ER 20 MEQ PO TBCR
40.0000 meq | EXTENDED_RELEASE_TABLET | Freq: Once | ORAL | Status: AC
  Administered 2012-01-25: 40 meq via ORAL
  Filled 2012-01-25: qty 2

## 2012-01-25 NOTE — Progress Notes (Signed)
Patient ID: DAISJA KESSINGER, female   DOB: 1956/02/03, 56 y.o.   MRN: [id number] Patient ID: IVETH HEIDEMANN, female   DOB: Oct 23, 1956, 56 y.o.   MRN: [id number] Patient ID: NAEEMAH JASMER, female   DOB: [birth year], 56 y.o.   MRN: [id number] Subjective: Patient c/o pleuritic back and left chest pain. LE edema slowly improving.  Objective: Weight change:   Intake/Output Summary (Last 24 hours) at 01/25/12 1522 Last data filed at 01/25/12 0830  Gross per 24 hour  Intake    360 ml  Output      0 ml  Net    360 ml   Blood pressure 126/76, pulse 106, temperature 98 F (36.7 C), temperature source Oral, resp. rate 18, height 5\' 5"  (1.651 m), weight 72.848 kg (160 lb 9.6 oz), SpO2 100.00%. Filed Vitals:   01/24/12 2100 01/25/12 0529 01/25/12 0930 01/25/12 1430  BP: 134/78 130/83 113/72 126/76  Pulse: 89 100 96 106  Temp: 97.8 F (36.6 C) 99 F (37.2 C) 98.1 F (36.7 C) 98 F (36.7 C)  TempSrc: Oral Oral Oral Oral  Resp: 18 18 18 18   Height:      Weight:      SpO2: 100% 100% 100% 100%    Physical Exam: General: Well-developed well-nourished in no acute cardiopulmonary distress. Appears slightly anxious Heart: Regular rate and rhythm, without murmurs, rubs, gallops.  Lungs: Clear to auscultation bilaterally. Decreased BS in bases. Abdomen: Soft, nontender, nondistended, positive bowel sounds. Ostomy bag with black liquid.  Wound clean and dry. Extremities: No clubbing cyanosis. 1 + bilateral lower extremity edema, with positive pedal pulses.  Neuro: Alert and onto x3. Cranial nerves II through XII are grossly intact. Sensation is intact. 5 out of 5 bilateral upper extremity strength. 5 out of 5 bilateral lower extremity strength. Visual fields intact. Gait not tested secondary to safety   Basic Metabolic Panel:  Lab 01/25/12 0865 01/24/12 0645  NA 137 139  K 3.5 3.6  CL 98 103  CO2 28 27  GLUCOSE 103* 111*  BUN 8 7  CREATININE 0.57 0.63  CALCIUM 8.8 8.8  MG -- --  PHOS  -- --   Liver Function Tests:  Lab 01/19/12 0645  AST 34  ALT 17  ALKPHOS 76  BILITOT 1.2  PROT 5.0*  ALBUMIN 1.7*   CBC:  Lab 01/25/12 0500 01/24/12 0645  WBC 10.5 10.9*  NEUTROABS -- --  HGB 10.2* 9.6*  HCT 29.6* 28.1*  MCV 85.5 85.2  PLT 293 287    Micro Results: No results found for this or any previous visit (from the past 240 hour(s)).  Studies/Results: Scheduled Meds:    . diphenhydrAMINE  25 mg Oral QHS  . fondaparinux (ARIXTRA) injection  7.5 mg Subcutaneous Q24H  . furosemide  80 mg Intravenous Q12H  . metoprolol succinate  25 mg Oral Daily  . pantoprazole  40 mg Oral Q1200  . potassium chloride  40 mEq Oral Once  . sodium chloride  1,000 mL Intravenous Once   Continuous Infusions:    . dextrose 5 % and 0.9 % NaCl with KCl 20 mEq/L 20 mL/hr at 01/21/12 1641  . heparin 1,700 Units/hr (01/25/12 1251)   PRN Meds:.diphenhydrAMINE, diphenhydrAMINE, HYDROmorphone, menthol-cetylpyridinium, Muscle Rub, naloxone, ondansetron (ZOFRAN) IV, ondansetron (ZOFRAN) IV, ondansetron, oxyCODONE-acetaminophen, promethazine, sodium chloride  Anti-infectives:  Anti-infectives     Start     Dose/Rate Route Frequency Ordered Stop   01/14/12 1600   piperacillin-tazobactam (ZOSYN) IVPB 3.375 g  Status:  Discontinued        3.375 g 12.5 mL/hr over 240 Minutes Intravenous Every 8 hours 01/14/12 1434 01/22/12 0807   01/14/12 0245   cefOXitin (MEFOXIN) 2 g in dextrose 5 % 50 mL IVPB        2 g 100 mL/hr over 30 Minutes Intravenous 60 min pre-op 01/14/12 0238 01/14/12 1020          Assessment/Plan: Principal Problem:  *Cancer of descending colon Active Problems:  Hypertension  Dyslipidemia  Acute renal insufficiency  PE (pulmonary embolism)  Hypokalemia  Ileus, postoperative  ARF (acute renal failure)  Volume overload  DVT (deep venous thrombosis)  Femoral DVT (deep venous thrombosis)    01/23/12 - Hematology/Oncology Consulted for Hypercoagulable state with  new diagnosis of colon cancer.   #1 bilateral pulmonary emboli right greater than left and bilateral deep vein thrombosis of LE.  Patient with colon cancer and likely hypercoagulable state. Consulted hematology/oncology.  2-D echo negative for right ventricular strain. Patient is currently IV heparin. Coumadin d/c per heme/onc and patient started on arixta per oncology. Hypercoagulable panel pending. Per Heme/Onc.  #2 volume overload  Likely secondary to aggressive IV fluid hydration. Patient had been in renal failure postop and felt to be prerenal in nature and was hydrated aggressively with resolution of her acute renal failure. Patient's net I./oral during this hospitalization has been +37 L approximately. Improving with diureses. Continue IV lasix.Follow Is and Os and monitor renal function closely. Keep lower extremities elevated.   #3 dyspnea  Likely secondary to problems #1 and 2. Improving.  #4 hypertension  Stable. Continue metoprolol.   #5 hypokalemia  Secondary to diureses. Repleted, monitor and follow with diuretics.   #6 postop ileus  Resolved   #7 acute renal failure  Resolved.   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 Oncology ff.  #9  Anxiety - result of acute health care status.  #10.  Back pain - Continue k pad and bengay for comfort.  #11.  Dark liquid ostomy output - Hgb stable.  Per primary team.   LOS: 12 days   Marvin Grabill 01/25/2012, 3:22 PM [phone]

## 2012-01-25 NOTE — Progress Notes (Signed)
ANTICOAGULATION CONSULT NOTE - Follow Up Consult  Pharmacy Consult for heparin Indication: PE/DVT  Labs:  Basename 01/24/12 2318 01/24/12 1730 01/24/12 0955 01/24/12 0645 01/23/12 0903 01/22/12 0520  HGB -- -- -- 9.6* 9.8* --  HCT -- -- -- 28.1* 28.5* --  PLT -- -- -- 287 272 --  APTT -- -- -- -- -- --  LABPROT -- -- -- -- 15.9* --  INR -- -- -- -- 1.24 --  HEPARINUNFRC 0.46 0.45 0.22* -- -- --  CREATININE -- -- -- 0.63 0.49* 0.71  CKTOTAL -- -- -- -- -- --  CKMB -- -- -- -- -- --  TROPONINI -- -- -- -- -- --   Assessment: 55yo female remains therapeutic on heparin while Coumadin is on hold.  Goal of Therapy:  Heparin level 0.3-0.7 units/ml   Plan:  Will continue heparin at current rate and continue to monitor.  Colleen Can PharmD BCPS 01/25/2012,12:01 AM

## 2012-01-25 NOTE — Progress Notes (Signed)
ANTICOAGULATION CONSULT NOTE - Follow Up Consult  Pharmacy Consult for heparin Indication: PE/DVT  Labs:  Basename 01/25/12 0500 01/24/12 2318 01/24/12 1730 01/24/12 0645 01/23/12 0903  HGB 10.2* -- -- 9.6* --  HCT 29.6* -- -- 28.1* 28.5*  PLT 293 -- -- 287 272  APTT -- -- -- -- --  LABPROT -- -- -- -- 15.9*  INR -- -- -- -- 1.24  HEPARINUNFRC 0.54 0.46 0.45 -- --  CREATININE 0.57 -- -- 0.63 0.49*  CKTOTAL -- -- -- -- --  CKMB -- -- -- -- --  TROPONINI -- -- -- -- --   Assessment: 55yo female remains therapeutic on heparin while Coumadin is on hold.  HL 0.54.  Hgb stable.  No issues noted. MD plans to switch to Arixtra on Monday 2/4.    Goal of Therapy:  Heparin level 0.3-0.7 units/ml   Plan:  Will continue heparin at current rate.  F/u plans for switch to Arixtra and if heparin level needs to be rechecked tomorrow morning.    Haynes Hoehn E PharmD BCPS 01/25/2012,8:31 AM

## 2012-01-25 NOTE — Progress Notes (Signed)
Patient ID: Mallory Morales, female   DOB: [birth year], 56 y.o.   MRN: [id number] 11 Days Post-Op  Subjective: Had some gas cramps, not severe, tolerating diet, ostomy working. Main c/o is "back" pain, but it is located over Left posero-lateral rib cage and is pleuritic in nature  Objective: Vital signs in last 24 hours: Temp:  [97.8 F (36.6 C)-99 F (37.2 C)] 99 F (37.2 C) (02/03 0529) Pulse Rate:  [89-100] 100  (02/03 0529) Resp:  [18-20] 18  (02/03 0529) BP: (123-134)/(75-83) 130/83 mmHg (02/03 0529) SpO2:  [100 %] 100 % (02/03 0529)   Intake/Output from previous day: 02/02 0701 - 02/03 0700 In: 480 [P.O.:480] Out: 2150 [Urine:1900; Stool:250] Intake/Output this shift:     General appearance: alert and no distress Resp: clear to auscultation bilaterally GI: Abdomen soft and not tender, BS+,   Incision:  VAC in place  Lab Results:   Basename 01/25/12 0500 01/24/12 0645  WBC 10.5 10.9*  HGB 10.2* 9.6*  HCT 29.6* 28.1*  PLT 293 287   BMET  Basename 01/25/12 0500 01/24/12 0645  NA 137 139  K 3.5 3.6  CL 98 103  CO2 28 27  GLUCOSE 103* 111*  BUN 8 7  CREATININE 0.57 0.63  CALCIUM 8.8 8.8   PT/INR  Basename 01/23/12 0903  LABPROT 15.9*  INR 1.24   ABG No results found for this basename: PHART:2,PCO2:2,PO2:2,HCO3:2 in the last 72 hours  MEDS, Scheduled    . diphenhydrAMINE  25 mg Oral QHS  . fondaparinux (ARIXTRA) injection  7.5 mg Subcutaneous Q24H  . furosemide  80 mg Intravenous Q12H  . heparin  1,000 Units Intravenous Once  . metoprolol succinate  25 mg Oral Daily  . pantoprazole  40 mg Oral Q1200  . sodium chloride  1,000 mL Intravenous Once    Studies/Results: Dg Chest 2 View  01/24/2012  *RADIOLOGY REPORT*  Clinical Data: Mid back pain, pleural effusions  CHEST - 2 VIEW  Comparison: CT 01/22/2012, chest radiograph 01/21/2012  Findings: Minimal improvement of aeration is noted with stable left perihilar ill-defined airspace opacity and  bilateral lower lobe atelectasis.  Small pleural effusions are present.  Visualized bowel gas pattern is normal.  Heart size is normal.  IMPRESSION: Slight improvement in aeration.  Stable small pleural effusions with associated compressive atelectasis.  Ill-defined left perihilar airspace opacity could reflect asymmetric airspace consolidation or less likely posteriorly tracking pleural effusion.  Original Report Authenticated By: Harrel Lemon, M.D.    Assessment/Plan: s/p Procedure(s): COLON RESECTION DVT/PE - this likely the cause for her "back" pain. Continue solid diet To start Arixa for anticoagulation after discharge. Wound vac off tomorrow   LOS: 12 days    Neisha Hinger J 01/25/2012

## 2012-01-26 LAB — BASIC METABOLIC PANEL
Chloride: 100 mEq/L (ref 96–112)
Creatinine, Ser: 0.56 mg/dL (ref 0.50–1.10)
GFR calc Af Amer: >90 mL/min (ref >90)
GFR calc non Af Amer: >90 mL/min (ref >90)

## 2012-01-26 LAB — CBC
MCV: 85.4 fL (ref 78.0–100.0)
Platelets: 253 10*3/uL (ref 150–400)
RDW: 13.7 % (ref 11.5–15.5)
WBC: 8.4 10*3/uL (ref 4.0–10.5)

## 2012-01-26 MED ORDER — HYDROCHLOROTHIAZIDE 25 MG PO TABS
25.0000 mg | ORAL_TABLET | Freq: Every day | ORAL | Status: DC
  Administered 2012-01-26 – 2012-01-27 (×2): 25 mg via ORAL
  Filled 2012-01-26 (×2): qty 1

## 2012-01-26 NOTE — Progress Notes (Signed)
   CARE MANAGEMENT NOTE 01/26/2012  Patient:  Mallory Morales, Mallory Morales   Account Number:  [id number]  Date Initiated:  01/15/2012  Documentation initiated by:  SUITS,TERI  Subjective/Objective Assessment:   Pt is 56 yr old admitted with colon cancer     Action/Plan:   Continue to follow for CM/discharge planning needs   Anticipated DC Date:  01/27/2012   Anticipated DC Plan:  HOME W HOME HEALTH SERVICES      DC Planning Services  CM consult      [facility] Choice  HOME HEALTH   Choice offered to / List presented to:  C-1 Patient        HH arranged  HH-1 RN      [facility] agency  [facility]   Status of service:  Completed, signed off  Discharge Disposition:  HOME W HOME HEALTH SERVICES  Per UR Regulation:  Reviewed for med. necessity/level of care/duration of stay  Comments:  01/26/2012 Carlyle Lipa, RN BSN CCM 1443--Pt on acute unit. VAC now off. Pt to have BID wet-to-dry dressing changes and she is comfortable with learning that using the mirror. Also will be doing her own Arixtra injections and has a new ostomy.  HHRN to assist with teaching all of this as well as wound mgmt. Address and phone in [facility] is correct.

## 2012-01-26 NOTE — Progress Notes (Signed)
I have seen and assessed patient and agree with Warnell Bureau assessment and plan.

## 2012-01-26 NOTE — Progress Notes (Signed)
Patient ID: Mallory Morales, female   DOB: 07/12/1956, 56 y.o.   MRN: [id number] 12 Days Post-Op  Subjective: Pt feels ok.  C/o pleuritic chest pain, but otherwise doing better.  Objective: Vital signs in last 24 hours: Temp:  [98 F (36.7 C)-98.6 F (37 C)] 98.1 F (36.7 C) (02/04 0800) Pulse Rate:  [81-106] 81  (02/04 0800) Resp:  [17-18] 18  (02/04 0800) BP: (108-126)/(69-80) 117/76 mmHg (02/04 0800) SpO2:  [96 %-100 %] 100 % (02/04 0800) Last BM Date: 01/25/12  Intake/Output from previous day: 02/03 0701 - 02/04 0700 In: 2507 [P.O.:600; I.V.:1907] Out: 1810 [Urine:1700; Stool:110] Intake/Output this shift:    PE: Abd: VAC in place, ostomy with good output.  Stoma is pink and healthy. +BS, ND Heart: regular Lungs: decrease BS at bases Ext: still with some LE edema  Lab Results:   Basename 01/26/12 0630 01/25/12 0500  WBC 8.4 10.5  HGB 9.5* 10.2*  HCT 28.0* 29.6*  PLT 253 293   BMET  Basename 01/26/12 0630 01/25/12 0500  NA 136 137  K 3.7 3.5  CL 100 98  CO2 31 28  GLUCOSE 126* 103*  BUN 6 8  CREATININE 0.56 0.57  CALCIUM 8.8 8.8   PT/INR  Basename 01/23/12 0903  LABPROT 15.9*  INR 1.24     Studies/Results: No results found.  Anti-infectives: Anti-infectives     Start     Dose/Rate Route Frequency Ordered Stop   01/14/12 1600   piperacillin-tazobactam (ZOSYN) IVPB 3.375 g  Status:  Discontinued        3.375 g 12.5 mL/hr over 240 Minutes Intravenous Every 8 hours 01/14/12 1434 01/22/12 0807   01/14/12 0245   cefOXitin (MEFOXIN) 2 g in dextrose 5 % 50 mL IVPB        2 g 100 mL/hr over 30 Minutes Intravenous 60 min pre-op 01/14/12 0238 01/14/12 1020           Assessment/Plan  1. S/o colectomy/ostomy for descending colon CA 2. [place] Ca T3, N0 3. Bilateral PEs, bilateral femoral blood clots, right calf DVT  Plan: 1. D/C VAC today and start NS WD dressing changes BID 2. D/c heparin and start arixta per pharmacy today 3. Wean O2 4. Set  up HH 5. Diuresis per medicine if she needs any more lasix 6. Possible d/c tomorrow.   LOS: 13 days    Eliza Green E 01/26/2012

## 2012-01-26 NOTE — Progress Notes (Signed)
PATIENT DETAILS Name: Mallory Morales Age: 56 y.o. Sex: female Date of Birth: June 04, 1956 Admit Date: 01/13/2012 AVW:[id number] R, MD, MD POA:     Subjective: Still with some sob, but unchanged. Pain in left breast on deep inspiration.   Objective: Vital signs in last 24 hours: Temp:  [98.1 F (36.7 C)-98.6 F (37 C)] 98.2 F (36.8 C) (02/04 1500) Pulse Rate:  [80-99] 80  (02/04 1500) Resp:  [17-18] 18  (02/04 1500) BP: (108-125)/(69-80) 124/70 mmHg (02/04 1500) SpO2:  [96 %-100 %] 100 % (02/04 1500) Weight change:  Last BM Date: 01/26/12  Intake/Output from previous day:  Intake/Output Summary (Last 24 hours) at 01/26/12 1647 Last data filed at 01/26/12 1200  Gross per 24 hour  Intake   2387 ml  Output    960 ml  Net   1427 ml    Physical Exam:  General: Well-developed well-nourished in no acute cardiopulmonary distress. Appears slightly anxious  Heart: Regular rate and rhythm, without murmurs, rubs, gallops.  Lungs: Clear to auscultation bilaterally. Decreased BS in bases.  Abdomen: Soft, nontender, nondistended, positive bowel sounds. Ostomy bag with black liquid. Wound clean and dry.  Extremities: No clubbing cyanosis. 1 -2 + bilateral lower extremity edema, with positive pedal pulses.  Neuro: Alert and onto x3. Cranial nerves II through XII are grossly intact. Sensation is intact. 5 out of 5 bilateral upper extremity strength. 5 out of 5 bilateral lower extremity strength. Visual fields intact. Gait not tested secondary to safety    Lab Results:  Lab 01/26/12 0630 01/25/12 0500 01/24/12 0645  HGB 9.5* 10.2* 9.6*  HCT 28.0* 29.6* 28.1*  WBC 8.4 10.5 10.9*  PLT 253 293 287     Lab 01/26/12 0630 01/25/12 0500 01/24/12 0645 01/23/12 0903 01/22/12 0520  NA 136 137 139 140 141  K 3.7 3.5 -- -- --  CL 100 98 103 103 106  CO2 31 28 27 24 27   GLUCOSE 126* 103* 111* 111* 133*  BUN 6 8 7 8 10   CREATININE 0.56 0.57 0.63 0.49* 0.71  CALCIUM 8.8 8.8 8.8 8.4  8.3*  MG -- -- -- -- --  PHOS -- -- -- -- --    Medications: Scheduled Meds:   . diphenhydrAMINE  25 mg Oral QHS  . fondaparinux (ARIXTRA) injection  7.5 mg Subcutaneous Q24H  . hydrochlorothiazide  25 mg Oral Daily  . pantoprazole  40 mg Oral Q1200  . DISCONTD: metoprolol succinate  25 mg Oral Daily  . DISCONTD: sodium chloride  1,000 mL Intravenous Once   Continuous Infusions:   . dextrose 5 % and 0.9 % NaCl with KCl 20 mEq/L 20 mL/hr at 01/26/12 0442  . heparin 1,700 Units/hr (01/26/12 0441)   PRN Meds:.diphenhydrAMINE, diphenhydrAMINE, HYDROmorphone, menthol-cetylpyridinium, Muscle Rub, naloxone, ondansetron (ZOFRAN) IV, ondansetron (ZOFRAN) IV, ondansetron, oxyCODONE-acetaminophen, promethazine, sodium chloride Antibiotics: Anti-infectives     Start     Dose/Rate Route Frequency Ordered Stop   01/14/12 1600   piperacillin-tazobactam (ZOSYN) IVPB 3.375 g  Status:  Discontinued        3.375 g 12.5 mL/hr over 240 Minutes Intravenous Every 8 hours 01/14/12 1434 01/22/12 0807   01/14/12 0245   cefOXitin (MEFOXIN) 2 g in dextrose 5 % 50 mL IVPB        2 g 100 mL/hr over 30 Minutes Intravenous 60 min pre-op 01/14/12 0238 01/14/12 1020           Assessment/Plan: Principal Problem:  *Cancer of descending colon  Active Problems:  Hypertension  Dyslipidemia  Acute renal insufficiency  PE (pulmonary embolism)  Hypokalemia  Ileus, postoperative  ARF (acute renal failure)  Volume overload  DVT (deep venous thrombosis)  Femoral DVT (deep venous thrombosis)   01/23/12 - Hematology/Oncology Consulted for Hypercoagulable state with new diagnosis of colon cancer.   #1 bilateral pulmonary emboli right greater than left and bilateral deep vein thrombosis of LE.  Patient with colon cancer and likely hypercoagulable state. Consulted hematology/oncology.  2-D echo negative for right ventricular strain. Patient is currently IV heparin. Coumadin d/c per heme/onc and patient started  on arixta per oncology. Hypercoagulable panel pending. Continue pain meds for pleuritic cp  #2 volume overload  Likely secondary to aggressive IV fluid hydration. Patient had been in renal failure postop and felt to be prerenal in nature and was hydrated aggressively with resolution of her acute renal failure. Patient's net I./oral during this hospitalization has been +37 L approximately. Improving with diureses. Ok to d/c IV diuretics and put pt on home hctz. extremities elevated.   #3 dyspnea  Likely secondary to problems #1 and 2. Improving.   #4 hypertension  Stable. Continue metoprolol.   #5 hypokalemia  Secondary to diureses. Repleted, monitor and follow with diuretics.   #6 postop ileus  Resolved   #7 acute renal failure  Resolved.   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 Oncology ff.   #9 Anxiety - result of acute health care status.   #10. Back pain - give k pad and bengay for comfort.   #11. Dark liquid ostomy output - Hgb stable. Per primary team.  Will sign off. Please call with any questions.   Cordelia Pen, NP-C [facility]  pgr [phone]      LOS: 13 days    01/26/2012, 4:47 PM

## 2012-01-26 NOTE — Progress Notes (Signed)
I discussed plans for likely D/C tomorrow. Patient examined and I agree with the assessment and plan  Violeta Gelinas, MD, MPH, FACS Pager: [phone]  01/26/2012 2:14 PM

## 2012-01-26 NOTE — Consult Note (Signed)
WOC consult follow-up Note Abd wound beefy red, mod tan drainage in cannister.  Requested to d/c vac dressing, wet to dry gauze applied.  Wound 12X2X1cm without further tunneling.    Colostomy pouch changed.  Stoma red and viable, above skin level, mod sem-iformed stool in pouch.  Pt able to close with velcro, and assisted with pouch application.  Stoma has decreased in size so now able to use 2 piece pouching system.  Stoma 2 1/2 inches.  Reviewed pouching routines and ordering supplies.  Pt plans to have home health assistance after discharge and denies further questions at this time. Supplies ordered to room for staff nurses.   Cammie Mcgee, RN, MSN, [employer]  [phone]

## 2012-01-27 ENCOUNTER — Encounter (HOSPITAL_COMMUNITY): Payer: Self-pay | Admitting: Hematology & Oncology

## 2012-01-27 DIAGNOSIS — D509 Iron deficiency anemia, unspecified: Secondary | ICD-10-CM

## 2012-01-27 DIAGNOSIS — C189 Malignant neoplasm of colon, unspecified: Secondary | ICD-10-CM

## 2012-01-27 DIAGNOSIS — I82409 Acute embolism and thrombosis of unspecified deep veins of unspecified lower extremity: Secondary | ICD-10-CM

## 2012-01-27 HISTORY — DX: Iron deficiency anemia, unspecified: D50.9

## 2012-01-27 LAB — BASIC METABOLIC PANEL
CO2: 26 mEq/L (ref 19–32)
Calcium: 9.1 mg/dL (ref 8.4–10.5)
Chloride: 100 mEq/L (ref 96–112)
Glucose, Bld: 121 mg/dL — ABNORMAL HIGH (ref 70–99)
Sodium: 138 mEq/L (ref 135–145)

## 2012-01-27 LAB — CBC
Hemoglobin: 10.3 g/dL — ABNORMAL LOW (ref 12.0–15.0)
MCH: 29.4 pg (ref 26.0–34.0)
MCV: 87.1 fL (ref 78.0–100.0)
RBC: 3.5 MIL/uL — ABNORMAL LOW (ref 3.87–5.11)

## 2012-01-27 LAB — CARDIOLIPIN ANTIBODIES, IGG, IGM, IGA
Anticardiolipin IgA: 5 APL U/mL — ABNORMAL LOW (ref <22)
Anticardiolipin IgG: 2 GPL U/mL — ABNORMAL LOW (ref <23)
Anticardiolipin IgM: 10 MPL U/mL — ABNORMAL LOW (ref <11)

## 2012-01-27 MED ORDER — PROMETHAZINE HCL 12.5 MG PO TABS: 12.5000 mg | ORAL_TABLET | Freq: Four times a day (QID) | ORAL | Status: AC | PRN

## 2012-01-27 MED ORDER — SODIUM CHLORIDE 0.9 % IV SOLN
1020.0000 mg | Freq: Once | INTRAVENOUS | Status: AC
  Administered 2012-01-27: 1020 mg via INTRAVENOUS
  Filled 2012-01-27: qty 34

## 2012-01-27 MED ORDER — SIMVASTATIN 40 MG PO TABS: 40.0000 mg | ORAL_TABLET | Freq: Every day | ORAL | Status: DC

## 2012-01-27 MED ORDER — OXYCODONE-ACETAMINOPHEN 5-325 MG PO TABS
1.0000 | ORAL_TABLET | ORAL | Status: AC | PRN
Start: 2012-01-27 — End: 2012-02-06

## 2012-01-27 MED ORDER — HYDROCHLOROTHIAZIDE 25 MG PO TABS: 25.0000 mg | ORAL_TABLET | Freq: Every day | ORAL | Status: DC

## 2012-01-27 MED ORDER — FONDAPARINUX SODIUM 7.5 MG/0.6ML ~~LOC~~ SOLN: 7.5000 mg | SUBCUTANEOUS | Status: DC

## 2012-01-27 NOTE — Progress Notes (Signed)
This office note has been dictated. [phone] is #.

## 2012-01-27 NOTE — Progress Notes (Signed)
01/27/12 1200 Michelle,RN CM RTC and stated that [facility] will come this pm and change pt.'s dressing and pt. Informed. Leandrew Koyanagi Mccrae Speciale,RN

## 2012-01-27 NOTE — Progress Notes (Signed)
Agree Genevieve Ritzel, MD, MPH, FACS Pager: [phone]  

## 2012-01-27 NOTE — Progress Notes (Signed)
01/27/12  1331 Pt. D/C'd home-accompanied out via w/c by Justin,NT; sister waiting at Short Stay to take pt. Home. No problems noted. Leandrew Koyanagi Adam Demary,RN

## 2012-01-27 NOTE — Discharge Summary (Signed)
Dionne Rossa, MD, MPH, FACS Pager: [phone]  

## 2012-01-27 NOTE — Discharge Summary (Signed)
Patient ID: Mallory Morales MRN: [id number] DOB/AGE: 1956/06/02 56 y.o.  Admit date: 01/13/2012 Discharge date: 01/27/2012  PCP: Dr. Blair Heys  Procedures:  Exploratory Laparotomy, left colectomy with mobilization of splenic flexure, colostomy  Consults: general surgery, internal medicine, and oncology Dr. Avel Peace Dr. Ramiro Harvest Dr. Arlan Organ  Reason for Admission:  This is a 56 year old female who presented to Dr. Lennie Odor office secondary to complaints of left-sided abdominal pain. She's had this pain since July 2012. She did have a CT scan that revealed a segment of proximal sigmoid colon with thickening. She was treated with Flagyl and Cipro for possible colitis. This did not completely improve her pain. Therefore she presented to Dr. Elnoria Howard for a colonoscopy. He then proceeded and found to a large hard obstructing sigmoid lesion consistent with sigmoid colon cancer. She was then admitted to the hospital for further evaluation and treatment. Please see admitting history and physical for further details.  Admission Diagnoses: 1. Obstructing sigmoid lesion consistent with malignancy 2. hypertension 3. hyperlipidemia  Hospital Course:  1. Obstructing sigmoid lesion: The patient was admitted and Gen. surgery was asked to evaluate the patient for further treatment. The following day she was taken to the operating room where she underwent an exploratory laparotomy with sigmoid colectomy and colostomy. The patient's wound was left open and a wound VAC was placed. She tolerated this procedure well. Postoperatively, she had an NG tube in place. She did develop a postoperative ileus. This took approximately 5 days for resolution. Once this began to resolve, her diet was advanced as tolerated.  She was tolerating a regular diet by postoperative day 9. 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 Her abdomen remained stable throughout the postoperative course.  She did undergo several wound VAC changes however on postoperative day #12 her wound VAC was discontinued and she was started on normal saline wet to dry dressing changes. Her wound is 100% clean with no drainage. Her colostomy is functioning appropriately with a pink and viable stoma.  2. Acute renal insufficiency: Postoperatively the patient was noted to have an elevating creatinine. She was given multiple boluses of IV fluids. This ultimately corrected on its own. Her renal function has now returned to normal. Secondary to an extensive amount of IV fluids the patient did become fluid overloaded. She was given several doses of Lasix.  3. Shortness of breath/bilateral pulmonary emboli and multiple deep vein thromboses: On postoperative day 4, the patient did begin complaining of some shortness of breath. She then had a chest x-ray which showed some bilateral pleural effusions. She was given another dose of Lasix. The following day she continued to complain of shortness of breath. She was also noted to have an elevated pulse. She denies any significant lower extremity pain to some edema. We ultimately decided to get a CT angiogram of the chest to rule out a pulmonary embolus. This CT did ultimately reveal multiple bilateral pulmonary emboli right greater than left. She was immediately started on a heparin bolus despite being on heparin DVT prophylaxis, SCDs, and ambulation. She then had lower extremity Dopplers completed which revealed bilateral femoral DVTs as well as a right calf DVT. Internal medicine was then asked to assist [place] with management of these medical issues. Their systems was greatly appreciated and helpful to the patient's care. Ultimately, oncology was also consulted. Instead of placing the patient on Coumadin for her multiple emboli, it was recommended that the patient get started on  Arixtra which is felt to be more efficacious with presumed malignancy emboli. The patient tolerated all of this  very well. On postoperative day #12, her heparin drip was discontinued and her Arixtra was started.  4. Hypertension: This remained stable throughout the patient's hospitalization.  5. Hyperlipidemia: This also remained stable throughout the patient's hospitalization as well.  By postoperative day 13, the patient was stable from all the above-mentioned inpatient medical problems. She was felt stable for discharge home.  Discharge Diagnoses:  Principal Problem:  *Cancer of descending colon Active Problems:  Hypertension  Dyslipidemia  Acute renal insufficiency  PE (pulmonary embolism)  Hypokalemia  Ileus, postoperative  ARF (acute renal failure)  Volume overload  DVT (deep venous thrombosis)  Femoral DVT (deep venous thrombosis)  Anemia, iron deficiency   Discharge Medications: Medication List  As of 01/27/2012  8:32 AM   STOP taking these medications         ciprofloxacin 500 MG tablet      HYDROcodone-acetaminophen 5-325 MG per tablet      metroNIDAZOLE 500 MG tablet         TAKE these medications         acetaminophen 500 MG tablet   Commonly known as: TYLENOL   Take 500 mg by mouth every 6 (six) hours as needed. For pain      fondaparinux 7.5 MG/0.6ML Soln   Commonly known as: ARIXTRA   Inject 0.6 mLs (7.5 mg total) into the skin daily.      hydrochlorothiazide 25 MG tablet   Commonly known as: HYDRODIURIL   Take 1 tablet (25 mg total) by mouth daily.      oxyCODONE-acetaminophen 5-325 MG per tablet   Commonly known as: PERCOCET   Take 1-2 tablets by mouth every 4 (four) hours as needed.      promethazine 12.5 MG tablet   Commonly known as: PHENERGAN   Take 1-2 tablets (12.5-25 mg total) by mouth every 6 (six) hours as needed for nausea.      simvastatin 40 MG tablet   Commonly known as: ZOCOR   Take 1 tablet (40 mg total) by mouth daily.            Discharge Instructions: Follow-up Information    Follow up with ROSENBOWER,TODD J, MD. Schedule an  appointment as soon as possible for a visit in 2 weeks.   Contact information:   [employer] [address] [place] [address] [phone]       Follow up with Josph Macho, MD in 3 weeks.   Contact information:   [address], [address] [place] [address] [phone]       Follow up with Thora Lance, MD.   Contact information:   [address] [place] [address] [phone]          Signed: Letha Cape 01/27/2012, 8:32 AM

## 2012-01-27 NOTE — Progress Notes (Signed)
01/27/12 1030  D/C instructions and prescriptions explained and given to pt. Pt. Had question about whether RN from [facility] will come out this pm to do dressing change, told her I will call Michelle,RN CM, and I did; Marcelino Duster will call [facility] and call me back.  Will do dressing change at 1100-done and pt. Did a good job and asked questions. Leandrew Koyanagi Alexsandra Shontz,RN

## 2012-01-27 NOTE — Progress Notes (Signed)
Addendum to my dictated note.  She will need Arixtra as an outpt for about 1 yr in my opinion.  Need to make sure that she has this upon d/c.  I will give IV iron today w/ Feraheme.  I will f/u in the office in 3 wks.  Will send the tumor off for OncoType Dx assay to see what her recurrence risk is.  [name] E.

## 2012-01-27 NOTE — Progress Notes (Signed)
Patient ID: Mallory Morales, female   DOB: [birth year], 56 y.o.   MRN: [id number] 13 Days Post-Op  Subjective: Pt feels well today.  No complaints.  Objective: Vital signs in last 24 hours: Temp:  [98.1 F (36.7 C)-99 F (37.2 C)] 98.5 F (36.9 C) (02/05 0814) Pulse Rate:  [79-97] 97  (02/05 0814) Resp:  [18-22] 19  (02/05 0814) BP: (119-138)/(70-86) 131/86 mmHg (02/05 0814) SpO2:  [96 %-100 %] 96 % (02/05 0814) Last BM Date: 01/26/12  Intake/Output from previous day: 02/04 0701 - 02/05 0700 In: 1433 [P.O.:720; I.V.:713] Out: 1301 [Urine:1300; Stool:1] Intake/Output this shift:    PE: Abd: soft, wound is 100% clean.  No drainage. Ostomy with air and stool present. Heart: regular Lungs: decrease sounds at bases  Lab Results:   Basename 01/27/12 0638 01/26/12 0630  WBC 6.5 8.4  HGB 10.3* 9.5*  HCT 30.5* 28.0*  PLT 262 253   BMET  Basename 01/27/12 0638 01/26/12 0630  NA 138 136  K 3.6 3.7  CL 100 100  CO2 26 31  GLUCOSE 121* 126*  BUN 4* 6  CREATININE 0.52 0.56  CALCIUM 9.1 8.8   PT/INR No results found for this basename: LABPROT:2,INR:2 in the last 72 hours   Studies/Results: No results found.  Anti-infectives: Anti-infectives     Start     Dose/Rate Route Frequency Ordered Stop   01/14/12 1600   piperacillin-tazobactam (ZOSYN) IVPB 3.375 g  Status:  Discontinued        3.375 g 12.5 mL/hr over 240 Minutes Intravenous Every 8 hours 01/14/12 1434 01/22/12 0807   01/14/12 0245   cefOXitin (MEFOXIN) 2 g in dextrose 5 % 50 mL IVPB        2 g 100 mL/hr over 30 Minutes Intravenous 60 min pre-op 01/14/12 0238 01/14/12 1020           Assessment/Plan  1. S/p colectomy/colostomy for [place] Ca 2. PEs 3. DVTs 4. HTN  Plan: 1. Plan for d/c today.  Patient doing well with arixtra. 2. Follow up with Dr. Myna Hidalgo, appreciate his assistance 3. Follow up with Dr. Abbey Chatters   LOS: 14 days    [name] 01/27/2012

## 2012-01-27 NOTE — Progress Notes (Signed)
Mallory Morales is doing quite well.  She will be able to go home today.  She is on Arixtra right now.  She is doing well with this.  I believe that she will need Arixtra for another year more than likely. I believe that her DVT/pulmonary emboli are serious and that given that they are related to an underlying malignancy, that we need to be aggressive with her treatment.  I do not have any trust at all in Coumadin when it comes to DVT/PE associated with malignancy.  She is eating better.  Her colostomy is working okay.  She is out of bed without difficulties.  We did do a postop CEA on her.  This was 2.8.  Her iron studies do show that her iron is on the low side.  I do believe that she would benefit from IV iron before she is discharged.  I will give her a dose of Feraheme at 1020 mg.  She has had no cough or shortness of breath.  Her breathing is feeling better since being on anticoagulation.  All of her vital signs are stable.  Blood pressure is 119/77.  Her pulse is 79, temperature is 98.8.  Her physical exam is pretty much unchanged from when we saw her on the 1st.  She has no leg swelling.  She has a well-healed laparotomy scar. Her colostomy is intact in the left lower quadrant.  Her cardiac exam is regular rate and rhythm with no murmurs, rubs, or bruits.  I think the real issue is whether or not she is going to need adjuvant chemotherapy.  Her tumor was obstructing.  There is some mucinous component to her tumor.  These are all construed as higher risk.  I will send off for a specimen for Oncotype assay analysis.  This does have some use in stage II colon cancer.  For now, I think it is "50/50" whether or not she is going to need adjuvant chemotherapy.  We will follow her up in the office in about 3 weeks.  Hopefully, if we do need adjuvant chemotherapy, we can get started within 4 weeks.  She will need to have a Port-A-Cath if we need adjuvant chemotherapy.  Will talk to  Dr. Abbey Chatters regarding this.    ______________________________ Mallory Morales, M.D. PRE/MEDQ  D:  01/27/2012  T:  01/27/2012  Job:  [id number]

## 2012-01-30 ENCOUNTER — Emergency Department (INDEPENDENT_AMBULATORY_CARE_PROVIDER_SITE_OTHER): Payer: 59

## 2012-01-30 ENCOUNTER — Encounter (HOSPITAL_BASED_OUTPATIENT_CLINIC_OR_DEPARTMENT_OTHER): Payer: Self-pay

## 2012-01-30 ENCOUNTER — Telehealth (INDEPENDENT_AMBULATORY_CARE_PROVIDER_SITE_OTHER): Payer: Self-pay

## 2012-01-30 ENCOUNTER — Other Ambulatory Visit: Payer: Self-pay

## 2012-01-30 ENCOUNTER — Emergency Department (HOSPITAL_BASED_OUTPATIENT_CLINIC_OR_DEPARTMENT_OTHER)
Admission: EM | Admit: 2012-01-30 | Discharge: 2012-01-30 | Disposition: A | Discharge status: Home / Self Care | Payer: 59 | Attending: Emergency Medicine | Admitting: Emergency Medicine

## 2012-01-30 DIAGNOSIS — E785 Hyperlipidemia, unspecified: Secondary | ICD-10-CM | POA: Insufficient documentation

## 2012-01-30 DIAGNOSIS — I10 Essential (primary) hypertension: Secondary | ICD-10-CM | POA: Insufficient documentation

## 2012-01-30 DIAGNOSIS — I2699 Other pulmonary embolism without acute cor pulmonale: Secondary | ICD-10-CM | POA: Insufficient documentation

## 2012-01-30 DIAGNOSIS — J90 Pleural effusion, not elsewhere classified: Secondary | ICD-10-CM

## 2012-01-30 DIAGNOSIS — J9819 Other pulmonary collapse: Secondary | ICD-10-CM

## 2012-01-30 DIAGNOSIS — R0602 Shortness of breath: Secondary | ICD-10-CM

## 2012-01-30 DIAGNOSIS — R300 Dysuria: Secondary | ICD-10-CM | POA: Insufficient documentation

## 2012-01-30 HISTORY — DX: Malignant neoplasm of colon, unspecified: C18.9

## 2012-01-30 LAB — BASIC METABOLIC PANEL
BUN: 6 mg/dL (ref 6–23)
CO2: 25 mEq/L (ref 19–32)
Calcium: 9.8 mg/dL (ref 8.4–10.5)
Creatinine, Ser: 0.6 mg/dL (ref 0.50–1.10)
Glucose, Bld: 107 mg/dL — ABNORMAL HIGH (ref 70–99)

## 2012-01-30 LAB — CBC
HCT: 32 % — ABNORMAL LOW (ref 36.0–46.0)
MCH: 29.7 pg (ref 26.0–34.0)
MCV: 87.9 fL (ref 78.0–100.0)
Platelets: 370 10*3/uL (ref 150–400)
RBC: 3.64 MIL/uL — ABNORMAL LOW (ref 3.87–5.11)

## 2012-01-30 LAB — URINALYSIS, ROUTINE W REFLEX MICROSCOPIC
Bilirubin Urine: NEGATIVE
Glucose, UA: NEGATIVE mg/dL
Hgb urine dipstick: NEGATIVE
Ketones, ur: NEGATIVE mg/dL
Protein, ur: NEGATIVE mg/dL
pH: 7.5 (ref 5.0–8.0)

## 2012-01-30 MED ORDER — LORAZEPAM 1 MG PO TABS
1.0000 mg | ORAL_TABLET | Freq: Once | ORAL | Status: AC
  Administered 2012-01-30: 1 mg via ORAL
  Filled 2012-01-30: qty 1

## 2012-01-30 MED ORDER — LORAZEPAM 1 MG PO TABS: 1.0000 mg | ORAL_TABLET | Freq: Three times a day (TID) | ORAL | Status: AC

## 2012-01-30 NOTE — ED Notes (Signed)
C/o sob and pain to left side of back since colon surgery 01/14/12-now c/o pain to right side of back-states she was dx with blood clots to both lungs, bilat groin and right calf post surgical

## 2012-01-30 NOTE — Telephone Encounter (Signed)
The nurse Amber called and states the pt is short of breath and having back pain mid back that goes left to right.  She can't catch her breath.  She is not coughing and has no fever.  Her vitals are as follows Bp150/82, pulse 110, respirations 18-22, pulse ox 93%.  The pain is not relieved by Tylenol.  Her ostomy is working.  I noted she had deep vein thromboses in the right calf and bilateral groin, along with pulmonary emboli.  She is taking shallow breaths.  She was sent home on Arixtra and no follow up has been made with Dr Myna Hidalgo for the blood clots.  She also can't breathe when she lays down.  I paged Dr Abbey Chatters to confirm that I should send her to the ER and he said yes.  I called the pt and advised her.  She wants to go to [facility] since it is near her house and I told her it was ok.

## 2012-01-30 NOTE — ED Provider Notes (Signed)
History     CSN: [id number]  Arrival date & time 01/30/12  1344   First MD Initiated Contact with Patient 01/30/12 1356     Chief Complaint  Patient presents with  . Shortness of Breath   Patient is a 56 y.o. female presenting with shortness of breath. The history is provided by the patient.  Shortness of Breath  The current episode started yesterday (but has had SOB since hospital d/c on 01/27/12). The onset was sudden. The problem occurs continuously. The problem has been gradually worsening. The problem is moderate. The symptoms are relieved by nothing. The symptoms are aggravated by nothing. Associated symptoms include shortness of breath. Pertinent negatives include no chest pain, no chest pressure, no fever, no rhinorrhea, no cough and no wheezing.   SOB worsened at 4 am this morning.  Accompanied by sharp, pleuritic pain in the back b/l (R>L).  Patient is concerned that her left sided clot moved to right side.  She admits to feeling anxious.  Denies N/V.  No bloody or purulent discharge from ostomy.    Past Medical History  Diagnosis Date  . Hypertension   . Hyperlipemia   . Anemia, iron deficiency 01/27/2012  . Colon cancer    Past Surgical History  Procedure Date  . Abdominal hysterectomy   . Hernia repair   . Colon resection 01/14/2012    Procedure: COLON RESECTION;  Surgeon: Adolph Pollack, MD;  Location: [facility] OR;  Service: General;  Laterality: N/A;  left colectomy, mobilization of splenic flexure, ostomy.  . Colostomy    Family History  Problem Relation Age of Onset  . Heart attack Father   . Hypertension Father   . Stomach cancer Sister   . Pancreatic cancer Brother    History  Substance Use Topics  . Smoking status: Former Games developer  . Smokeless tobacco: Never Used  . Alcohol Use: No     Quit drinking after hemicolectomy     Review of Systems  Constitutional: Negative for fever, chills, diaphoresis, appetite change and fatigue.  HENT: Negative for rhinorrhea  and neck stiffness.   Respiratory: Positive for shortness of breath. Negative for cough and wheezing.   Cardiovascular: Negative for chest pain.  Gastrointestinal: Negative for nausea, vomiting, abdominal pain, blood in stool and abdominal distention.  Genitourinary: Positive for dysuria and flank pain. Negative for urgency, frequency and hematuria.  Musculoskeletal: Positive for back pain. Negative for myalgias, joint swelling and arthralgias.  Skin: Negative for rash.  Neurological: Negative for dizziness, light-headedness, numbness and headaches.  Psychiatric/Behavioral: Negative for hallucinations and confusion. The patient is nervous/anxious.     Allergies  Strawberry - swelling  Home Medications   Current Outpatient Rx  Name Route Sig Dispense Refill  . ACETAMINOPHEN 500 MG PO TABS Oral Take 500 mg by mouth every 6 (six) hours as needed. For pain    . FONDAPARINUX SODIUM 7.5 MG/0.6ML [place] SOLN Subcutaneous Inject 0.6 mLs (7.5 mg total) into the skin daily. 18 mL 6  . HYDROCHLOROTHIAZIDE 25 MG PO TABS Oral Take 1 tablet (25 mg total) by mouth daily. 30 tablet 0  . OXYCODONE-ACETAMINOPHEN 5-325 MG PO TABS Oral Take 1-2 tablets by mouth every 4 (four) hours as needed. 40 tablet 0  . PROMETHAZINE HCL 12.5 MG PO TABS Oral Take 1-2 tablets (12.5-25 mg total) by mouth every 6 (six) hours as needed for nausea. 20 tablet 0  . SIMVASTATIN 40 MG PO TABS Oral Take 1 tablet (40 mg total) by mouth  daily. 30 tablet 0   BP 135/85  Pulse 116  Temp(Src) 98 F (36.7 C) (Oral)  Resp 24  Ht 5\' 5"  (1.651 m)  Wt 147 lb 6.4 oz (66.86 kg)  BMI 24.53 kg/m2  SpO2 99%  Physical Exam  Vitals reviewed. Constitutional: She is oriented to person, place, and time. She appears well-developed and well-nourished. She appears distressed.  HENT:  Mouth/Throat: Oropharynx is clear and moist.  Eyes: Conjunctivae and EOM are normal. Pupils are equal, round, and reactive to light.  Neck: Normal range of motion.  Neck supple.  Cardiovascular: Regular rhythm, normal heart sounds and intact distal pulses.  Exam reveals no gallop and no friction rub.   No murmur heard.      tachycardic  Pulmonary/Chest: Breath sounds normal. She has no wheezes. She has no rales. She exhibits no tenderness.       Shallow breathing  Abdominal: Soft. Bowel sounds are normal. She exhibits no distension and no mass. There is no tenderness. There is no rebound and no guarding.       Ostomy site dressing clean/dry/in place, was just changed, no bloody or purulent discharge in ostomy bag  Genitourinary:       B/l CVA tenderness  Musculoskeletal: Normal range of motion.       B/l 1+ pedal edema (L>R)  Neurological: She is alert and oriented to person, place, and time. No cranial nerve deficit.  Skin: Skin is warm and dry. No rash noted. She is not diaphoretic. No erythema.  Psychiatric:       Anxious appearing   ED Course  Procedures (including critical care time)  Labs Reviewed  CBC - Abnormal; Notable for the following:    RBC 3.64 (*)    Hemoglobin 10.8 (*)    HCT 32.0 (*)    All other components within normal limits  BASIC METABOLIC PANEL - Abnormal; Notable for the following:    Potassium 3.3 (*)    Glucose, Bld 107 (*)    All other components within normal limits  URINALYSIS, ROUTINE W REFLEX MICROSCOPIC   Dg Chest 2 View  01/30/2012  *RADIOLOGY REPORT*  Clinical Data: Short of breath  CHEST - 2 VIEW  Comparison: 01/24/2012  Findings: Heart size is mildly enlarged.  There are bilateral pleural effusions, left greater than right.  No edema noted.  Compressive type atelectasis is noted in both lung bases.  IMPRESSION:  1.  Bilateral pleural effusions, left greater than right. 2.  Bibasilar atelectasis.  Original Report Authenticated By: Rosealee Albee, M.D.    Date: 01/30/2012  Rate: regular  Rhythm: sinus tachycardia  QRS Axis: normal  Intervals: normal  ST/T Wave abnormalities: normal  Conduction  Disutrbances:none  Narrative Interpretation:   Old EKG Reviewed: unchanged  1. PE (pulmonary embolism)     MDM  56 yo woman recently d/c from hospital s/p colectomy with ostomy (2/2 colon Ca, re-anastomosis in 6 month) and diagnosis of PE on arixtra (pt compliant with medication) p/w worsening SOB and pleuritic back pain.  Differentials include existing PE with superimposed anxiety, upper/lower UTI, muscle spasm.  Pneumothorax unlikely as pt saturating well on RA.  EKG does not suggest MI.  UA negative.  CBC suggests stable Hb.   Workup suggests SOB is 2/2 exisiting PE.  She continues to saturate well on RA.  CXR suggests stable pleural effusions. Patient to continue arixtra and follow up with PCP.  May benefit from anxiolytic outpt as she copes with acute medical  illnesses, for now will d/c with Ativan 1 mg PO #10.  Pt notes she will use tramadol she has at home for pain instead of percocet, as she does not like percocet.     Vernice Jefferson, MD 01/30/12 1538  Vernice Jefferson, MD 01/30/12 1553

## 2012-02-03 ENCOUNTER — Telehealth: Payer: Self-pay | Admitting: Hematology & Oncology

## 2012-02-03 NOTE — Telephone Encounter (Signed)
Pt called said she needed a hospital follow up appointment 3 weeks from 2-5 she is aware of 2-26 appointment and time

## 2012-02-04 ENCOUNTER — Telehealth (INDEPENDENT_AMBULATORY_CARE_PROVIDER_SITE_OTHER): Payer: Self-pay

## 2012-02-04 NOTE — Telephone Encounter (Signed)
Mallory Morales from [facility] called to report that today was her last scheduled home visit.  She states the patient is not yet comfortable with her ostomy bag care.  She wanted to know if she could extend it to one visit tomorrow, and then 2 x wk x 2 wks then 1 x wk x 1 wk.  I told her that should be fine and I would let Dr. Abbey Chatters know.  The patient is taking care of her midline abd wound now, and is confident with those dressing changes.

## 2012-02-04 NOTE — ED Provider Notes (Signed)
I saw and evaluated the patient, reviewed the resident's note and I agree with the findings and plan. I saw and evaluated the EKG and agree with the resident's interpretation. Patient recently discharged from the hospital with known PEs on subcutaneous anti-coagulation not Coumadin at this time due to recent colostomy. Producing stools normally without any abdominal pain. Intermittent chest pain and shortness of breath with exertion which has been unchanged since hospital discharge 2 days ago. She is satting normally here in heart rate improves with rest. Labs within normal limits an x-ray unchanged. There are some mild effusions but they're improving. Do not feel the need to rescan her chest at this time as it was just in and patient has known PEs. Patient is very anxious on exam and improves after Ativan. Educated her about the course of PEs and encouraged her to continue close followup with her doctors. She has 3 appointments with a pulmonologist, oncologist, surgeon in the next 2 weeks. She is comfortable with the plan of going home and knows she can return anytime for worsening symptoms.  Gwyneth Sprout, MD 02/04/12 [phone]

## 2012-02-06 ENCOUNTER — Telehealth (INDEPENDENT_AMBULATORY_CARE_PROVIDER_SITE_OTHER): Payer: Self-pay

## 2012-02-06 NOTE — Telephone Encounter (Signed)
Left Marylene Land, [facility], a voice mail message that Dr. Abbey Chatters authorized extended visits for the patient.

## 2012-02-09 ENCOUNTER — Telehealth (INDEPENDENT_AMBULATORY_CARE_PROVIDER_SITE_OTHER): Payer: Self-pay

## 2012-02-09 NOTE — Telephone Encounter (Signed)
Spoke with Mallory Morales at Dr. Randel Books office.  They have scheduled the pt to see their PA at 4:15 today.  I called and left the pt a VM.

## 2012-02-11 ENCOUNTER — Ambulatory Visit (INDEPENDENT_AMBULATORY_CARE_PROVIDER_SITE_OTHER): Payer: 59 | Admitting: General Surgery

## 2012-02-11 ENCOUNTER — Encounter (INDEPENDENT_AMBULATORY_CARE_PROVIDER_SITE_OTHER): Payer: Self-pay | Admitting: General Surgery

## 2012-02-11 VITALS — BP 132/84 | HR 84 | Temp 96.8°F | Resp 20 | Ht 65.0 in | Wt 141.4 lb

## 2012-02-11 DIAGNOSIS — Z9889 Other specified postprocedural states: Secondary | ICD-10-CM

## 2012-02-11 NOTE — Progress Notes (Signed)
Operation: Left colectomy and colostomy  Date: January 14, 2012  Pathology: Stage II colon cancer  HPI: She is here for her first postoperative visit. Her postoperative course was complicated by DVT and pulmonary embolism despite appropriate prophylaxis for this. She is on Arixtra.  She does not have much of an appetite. She lost about 25 pounds. The colostomy is working well. She drinking protein shakes. Her wound is healing by secondary intention and she's doing dressing changes twice a day.   Physical Exam: Mallory Morales is in no acute distress  Abdomen-midline incision is healing well with granulation tissue present; left-sided colostomy is pink and draining stool.   Assessment: Stage II obstructing colon cancer-wound healing well; colostomy working. She is being followed by Dr. Myna Hidalgo.  Plan: Vitamin supplements. Continue light activities. Diet as tolerated. Continue current dressing changes. Return visit 3-4 weeks.

## 2012-02-11 NOTE — Patient Instructions (Signed)
Eat what ever you want.  Take Vitamin A, Vitamin C, and Zinc supplements.

## 2012-02-17 ENCOUNTER — Other Ambulatory Visit (HOSPITAL_BASED_OUTPATIENT_CLINIC_OR_DEPARTMENT_OTHER): Payer: 59 | Admitting: Lab

## 2012-02-17 ENCOUNTER — Ambulatory Visit (HOSPITAL_BASED_OUTPATIENT_CLINIC_OR_DEPARTMENT_OTHER): Payer: 59 | Admitting: Hematology & Oncology

## 2012-02-17 VITALS — BP 123/75 | HR 82 | Temp 97.3°F | Ht 65.0 in | Wt 142.0 lb

## 2012-02-17 DIAGNOSIS — C186 Malignant neoplasm of descending colon: Secondary | ICD-10-CM

## 2012-02-17 DIAGNOSIS — I82409 Acute embolism and thrombosis of unspecified deep veins of unspecified lower extremity: Secondary | ICD-10-CM

## 2012-02-17 DIAGNOSIS — C187 Malignant neoplasm of sigmoid colon: Secondary | ICD-10-CM

## 2012-02-17 DIAGNOSIS — I2699 Other pulmonary embolism without acute cor pulmonale: Secondary | ICD-10-CM

## 2012-02-17 LAB — CBC WITH DIFFERENTIAL (CANCER CENTER ONLY)
BASO#: 0 10*3/uL (ref 0.0–0.2)
BASO%: 0.2 % (ref 0.0–2.0)
EOS%: 2.1 % (ref 0.0–7.0)
HCT: 32.6 % — ABNORMAL LOW (ref 34.8–46.6)
HGB: 11 g/dL — ABNORMAL LOW (ref 11.6–15.9)
LYMPH%: 28.4 % (ref 14.0–48.0)
MCH: 30.2 pg (ref 26.0–34.0)
MCHC: 33.7 g/dL (ref 32.0–36.0)
MCV: 90 fL (ref 81–101)
NEUT%: 62.8 % (ref 39.6–80.0)
RDW: 13.5 % (ref 11.1–15.7)

## 2012-02-17 LAB — COMPREHENSIVE METABOLIC PANEL
AST: 15 U/L (ref 0–37)
Albumin: 3.8 g/dL (ref 3.5–5.2)
Alkaline Phosphatase: 85 U/L (ref 39–117)
BUN: 12 mg/dL (ref 6–23)
Calcium: 9.5 mg/dL (ref 8.4–10.5)
Creatinine, Ser: 0.67 mg/dL (ref 0.50–1.10)
Glucose, Bld: 94 mg/dL (ref 70–99)
Potassium: 3.8 mEq/L (ref 3.5–5.3)

## 2012-02-17 LAB — CEA: CEA: 2.4 ng/mL (ref 0.0–5.0)

## 2012-02-17 NOTE — Progress Notes (Signed)
CC:   Jordan Hawks. Elnoria Howard, MD Adolph Pollack, M.D.  DIAGNOSES: 1. 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 2. Pulmonary embolism/deep venous thrombosis, postoperative.  CURRENT THERAPY:  Arixtra 7.5 mg subcu daily.  INTERIM HISTORY:  Ms. Pearse comes in for her 1st office visit.  I saw her in the hospital via consultation by Dr. Abbey Chatters.  Dr. Abbey Chatters kindly asked me to see Ms. Velie after he did a partial colectomy on her.  He did do a temporary colostomy on her.  This was done back in January.  The pathology report ([id number]) showed an invasive moderate to poorly differentiated adenocarcinoma.  There was no lymphovascular invasion.  There was invasion into the pericolonic soft tissue.  All margins were negative.  She had 36 lymph nodes that were sampled and these were negative.  There was no evidence of perineural invasion. There were some areas of prominent lymphoid aggregation.  We did do a Oncotype assay on her.  She had a recurrence score of 20. This would indicate a 3-year risk of recurrence of about 10% (her risk of recurrence is less than the indicated risk secondary to our for extensive lymph node resection).  She has about a 5-year risk of recurrence of about 15%.  She is doing okay with her colostomy.  She is getting used to this.  Her biggest problem is with respect to the pulmonary embolism.  She is on Arixtra for this.  Her breathing is doing better.  Her appetite is picking up.  She has a little bit more energy.  Overall, her performance status is ECOG 1.  She has had no bleeding.  There has been no fever.  She has had no headache.  She has had no nausea, vomiting.  PHYSICAL EXAM:  General:  This is a well-developed, well-nourished black female in no obvious distress.  Vital signs:  Show a temperature of 97.3, pulse 82, respiratory rate 18, blood pressure 123/75.  Weight is 142.  Head and neck:  Exam shows a normocephalic,  atraumatic skull. There are no ocular or oral lesions.  There are no palpable cervical or supraclavicular lymph nodes.  Lungs:  Clear bilaterally.  Cardiac: Regular rate and rhythm with normal S1, S2.  There are no murmurs, rubs, or bruits.  Abdomen:  Soft.  Her abdominal wound is dressed.  Her colostomy is in the left lower quadrant.  She has no ascites.  There is no abdominal mass.  There is no palpable hepatosplenomegaly.  Back:  No tenderness over the spine, ribs, or hips.  Extremities:  No clubbing, cyanosis or edema.  Neurological:  Shows no focal neurological deficits. Skin:  No rashes, ecchymosis or petechia.  LABORATORY STUDIES:  White cell count is 4.8, hemoglobin 11, hematocrit 33, platelet count is 257.  MCV is 90.  IMPRESSION:  Ms. Umholtz is a 56 year old African American female with stage II (T3 N0 M0) adenocarcinoma of the sigmoid colon.  I believe that the Oncotype assay has helped [place] out.  Again, given her extensive lymph node resection, her risk of recurrence is going to be about 10% at 3 years.  At 5 years, risk of recurrence is going to be about 13% to 15%.  As such, I just do not feel that we are going to gain a lot of benefit from adjuvant chemotherapy.  I believe that with adjuvant chemotherapy, we would be gaining a couple of percentage point improvement in her survival.  I do believe  that Ms. Raju needs close followup.  I believe that she does need followup CT scans every 3 months for the first year and every 4 months the second year.  I also make sure that we follow up with her DVT/PE.  We can do Dopplers of her legs in a couple months.  I will set her up with her first CT scan in a couple of months.  Of note, she did have, I think, a CEA level while in the hospital.  Her CEA was 2.8.  Again, I do feel that Ms. Melching has a good prognosis stage II colon cancer.  Again, I just do not see that we would be gaining a significant benefit from adjuvant  chemotherapy with her.  We will get Ms. Gauthreaux set up with all of her tests and see her back in late April, early May.    ______________________________ Josph Macho, M.D. PRE/MEDQ  D:  02/17/2012  T:  02/17/2012  Job:  [id number]

## 2012-02-17 NOTE — Progress Notes (Signed)
This office note has been dictated.

## 2012-02-18 ENCOUNTER — Telehealth: Payer: Self-pay | Admitting: Hematology & Oncology

## 2012-02-18 NOTE — Telephone Encounter (Signed)
Left pt message to call, have several appointments I need to make for her.

## 2012-02-19 ENCOUNTER — Telehealth: Payer: Self-pay | Admitting: Hematology & Oncology

## 2012-02-19 NOTE — Telephone Encounter (Signed)
Mailed instruction sheet for CT to be NPO 4 hrs and to pick up contrast prior to appointment Also mailed 5-6 MD appointment

## 2012-02-26 ENCOUNTER — Ambulatory Visit (INDEPENDENT_AMBULATORY_CARE_PROVIDER_SITE_OTHER): Payer: 59 | Admitting: General Surgery

## 2012-02-26 ENCOUNTER — Encounter (INDEPENDENT_AMBULATORY_CARE_PROVIDER_SITE_OTHER): Payer: Self-pay | Admitting: General Surgery

## 2012-02-26 VITALS — BP 124/86 | HR 88 | Temp 96.8°F | Resp 18 | Ht 65.0 in | Wt 142.6 lb

## 2012-02-26 DIAGNOSIS — Z9889 Other specified postprocedural states: Secondary | ICD-10-CM

## 2012-02-26 DIAGNOSIS — Z09 Encounter for follow-up examination after completed treatment for conditions other than malignant neoplasm: Secondary | ICD-10-CM

## 2012-02-26 DIAGNOSIS — S31109A Unspecified open wound of abdominal wall, unspecified quadrant without penetration into peritoneal cavity, initial encounter: Secondary | ICD-10-CM

## 2012-02-26 NOTE — Progress Notes (Signed)
[name] noticed some fluid draining from her incision and referred her to the office to have it checked.  PE: Abd-lower midline incision with bullous area draining serous fluid; this was opened up and a wound seroma was drained; the wound was 2.5 cm deep and was packed with saline moistened gauze followed by a dry dressing.  Assess:  Wound seroma-drained.  Plan:  BID NS damp to dry dressing changes.  RTC 3/18.

## 2012-02-26 NOTE — Patient Instructions (Signed)
Twice a day normal saline damp to dry dressing changes.  Expect a lot of drainage at first.

## 2012-03-05 ENCOUNTER — Telehealth (INDEPENDENT_AMBULATORY_CARE_PROVIDER_SITE_OTHER): Payer: Self-pay | Admitting: General Surgery

## 2012-03-05 NOTE — Telephone Encounter (Signed)
[facility] nurse calling in to report a questionable new abscess developing on the lower portion of pt's abdominal incision.  It is bulging and pink, but not tender or draining.  The pt is afebrile.  Her f/u appt is Monday, 03/15/12, with Dr. Abbey Chatters.  Pt will go to ER if condition worsens over the week-end.

## 2012-03-08 ENCOUNTER — Encounter (INDEPENDENT_AMBULATORY_CARE_PROVIDER_SITE_OTHER): Payer: Self-pay | Admitting: General Surgery

## 2012-03-08 ENCOUNTER — Ambulatory Visit (INDEPENDENT_AMBULATORY_CARE_PROVIDER_SITE_OTHER): Payer: 59 | Admitting: General Surgery

## 2012-03-08 ENCOUNTER — Encounter (INDEPENDENT_AMBULATORY_CARE_PROVIDER_SITE_OTHER): Payer: Self-pay

## 2012-03-08 VITALS — BP 134/82 | HR 70 | Temp 97.8°F | Resp 18 | Ht 65.0 in | Wt 142.0 lb

## 2012-03-08 DIAGNOSIS — Z9889 Other specified postprocedural states: Secondary | ICD-10-CM

## 2012-03-08 NOTE — Patient Instructions (Signed)
May resume  normal activities but avoid repetitive heavy lifting or abdominal exercises.  May shower. Place a dry dressing on the wound daily.

## 2012-03-08 NOTE — Progress Notes (Signed)
Operation: Left colectomy and colostomy  Date: January 14, 2012  Pathology: Stage II colon cancer  HPI: She is here for another postoperative visit. Her postoperative course was complicated by DVT and pulmonary embolism despite appropriate prophylaxis for this. She is on Arixtra. Her wound is almost completely healed.  In the last week she's had intermittent discomfort in the right lower quadrant when she voids. She was given some Cipro but she states a urine culture was negative. Sometimes, she has a sensation that she needs to have a bowel movement.  Physical Exam: Mallory Morales is in no acute distress  Abdomen-midline incision is healing well and is almost completely healed; left sided colostomy looks good.  Assessment: Stage II obstructing colon cancer-wound healing well; colostomy working.  Etiology of her discomfort with urination is unclear.  Plan: Activities as tolerated except avoid repetitive heavy lifting and abdominal exercises. May return to work. Dry dressing to wound daily appear. May shower. Return visit 6 weeks.

## 2012-03-24 ENCOUNTER — Ambulatory Visit (INDEPENDENT_AMBULATORY_CARE_PROVIDER_SITE_OTHER): Payer: 59 | Admitting: Surgery

## 2012-03-24 VITALS — BP 150/100 | HR 84 | Temp 96.8°F | Resp 14 | Ht 65.0 in | Wt 144.0 lb

## 2012-03-24 DIAGNOSIS — Z9889 Other specified postprocedural states: Secondary | ICD-10-CM

## 2012-03-24 NOTE — Patient Instructions (Signed)
Come back nxst week if the area has not healed completely

## 2012-03-24 NOTE — Progress Notes (Signed)
Chief complaint: Drainage from wound  History of present illness: The patient had a colectomy and a couple of weeks ago had a wound seroma drained. It was never close over and she continues to note some drainage came into the urgent office today.  She is not having any other symptoms. She has no fever. She is eating well. She is having abdominal pain or incisional pain.  Exam: Vital signs:BP 150/100  Pulse 84  Temp(Src) 96.8 F (36 C) (Temporal)  Resp 14  Ht 5\' 5"  (1.651 m)  Wt 144 lb (65.318 kg)  BMI 23.96 kg/m2  Gen.: The patient is alert oriented and healthy appearing Abdomen: The abdomen is soft and benign. There is a 2 mm area of hypertrophic granulation skin the midline wound. There is no cavity underneath this identified. This area of the patient's concern about.  Impression hypertrophic granulation skin the wound  Plan: I applied Silver nitrate. There is only 2 mm I think this will close over but if she continues to have drainage she'll come back for a recheck next week.

## 2012-03-31 ENCOUNTER — Telehealth (INDEPENDENT_AMBULATORY_CARE_PROVIDER_SITE_OTHER): Payer: Self-pay | Admitting: General Surgery

## 2012-03-31 ENCOUNTER — Ambulatory Visit (INDEPENDENT_AMBULATORY_CARE_PROVIDER_SITE_OTHER): Payer: 59 | Admitting: General Surgery

## 2012-03-31 VITALS — BP 120/80 | HR 80 | Temp 99.2°F | Resp 16 | Ht 65.0 in | Wt 147.0 lb

## 2012-03-31 DIAGNOSIS — Z5189 Encounter for other specified aftercare: Secondary | ICD-10-CM

## 2012-03-31 DIAGNOSIS — Z4889 Encounter for other specified surgical aftercare: Secondary | ICD-10-CM

## 2012-03-31 MED ORDER — AMOXICILLIN-POT CLAVULANATE 875-125 MG PO TABS: 1.0000 | ORAL_TABLET | Freq: Two times a day (BID) | ORAL | Status: AC

## 2012-03-31 MED ORDER — AMOXICILLIN-POT CLAVULANATE 875-125 MG PO TABS: 1.0000 | ORAL_TABLET | Freq: Two times a day (BID) | ORAL | Status: DC

## 2012-03-31 NOTE — Telephone Encounter (Signed)
Pt calling from work to report her incision is (for the last 48 hours) "swollen, red, angry, tender" and today "burning."  She denies fever.  Pt had colon resection on 01/14/12.

## 2012-03-31 NOTE — Progress Notes (Signed)
Subjective:     Patient ID: Mallory Morales, female   DOB: 1956-07-24, 56 y.o.   MRN: [id number]  HPI This patient follows up status post [facility] for obstructing colon cancer. She was seen last week for some granulation tissue in her midline wound and silver nitrate was applied and she says that this has since healed without difficulty. However, she reports some burning and some swelling in the area of her midline just to the right of her ostomy appliance. She denies any fevers or chills  Review of Systems     Objective:   Physical Exam No acute distress and nontoxic-appearing Her incision is healing well without any drainage. She does have some edema in the skin and a slight erythema but no evidence of fluctuance. At that ultrasound was performed with a high-frequency linear probe and there is no edema in the soft tissues but no evidence of fluid collection such a seroma or hematoma or abscess    Assessment:     Status post colostomy and partial colectomy for obstructive colon cancer Erythema and edema which is likely due to the gurney wound infection or cellulitis here to have recommended Augmentin b.i.d. for treatment but I did not see any drainable fluid collection at this time. I think that this should take care of the problem and we will see her back in one to 2 weeks for reevaluation or sooner if the redness increases or fevers or chills or other signs of infection.    Plan:     Augmentin and f/u in 1-2 weeks

## 2012-04-14 ENCOUNTER — Ambulatory Visit (INDEPENDENT_AMBULATORY_CARE_PROVIDER_SITE_OTHER)
Admission: RE | Admit: 2012-04-14 | Discharge: 2012-04-14 | Disposition: A | Discharge status: Home / Self Care | Payer: 59 | Source: Ambulatory Visit | Attending: Hematology & Oncology | Admitting: Hematology & Oncology

## 2012-04-14 ENCOUNTER — Ambulatory Visit (HOSPITAL_BASED_OUTPATIENT_CLINIC_OR_DEPARTMENT_OTHER)
Admission: RE | Admit: 2012-04-14 | Discharge: 2012-04-14 | Disposition: A | Discharge status: Home / Self Care | Payer: 59 | Source: Ambulatory Visit | Attending: Hematology & Oncology | Admitting: Hematology & Oncology

## 2012-04-14 DIAGNOSIS — Z933 Colostomy status: Secondary | ICD-10-CM

## 2012-04-14 DIAGNOSIS — J984 Other disorders of lung: Secondary | ICD-10-CM | POA: Insufficient documentation

## 2012-04-14 DIAGNOSIS — C186 Malignant neoplasm of descending colon: Secondary | ICD-10-CM

## 2012-04-14 DIAGNOSIS — Z9049 Acquired absence of other specified parts of digestive tract: Secondary | ICD-10-CM

## 2012-04-14 DIAGNOSIS — R935 Abnormal findings on diagnostic imaging of other abdominal regions, including retroperitoneum: Secondary | ICD-10-CM

## 2012-04-14 DIAGNOSIS — Z86718 Personal history of other venous thrombosis and embolism: Secondary | ICD-10-CM

## 2012-04-14 DIAGNOSIS — Z483 Aftercare following surgery for neoplasm: Secondary | ICD-10-CM | POA: Insufficient documentation

## 2012-04-14 DIAGNOSIS — R911 Solitary pulmonary nodule: Secondary | ICD-10-CM

## 2012-04-14 DIAGNOSIS — C189 Malignant neoplasm of colon, unspecified: Secondary | ICD-10-CM | POA: Insufficient documentation

## 2012-04-14 MED ORDER — IOHEXOL 300 MG/ML  SOLN
100.0000 mL | Freq: Once | INTRAMUSCULAR | Status: AC | PRN
  Administered 2012-04-14: 100 mL via INTRAVENOUS

## 2012-04-16 ENCOUNTER — Telehealth: Payer: Self-pay | Admitting: *Deleted

## 2012-04-16 NOTE — Telephone Encounter (Signed)
Called patients husband to let him know that patient venous ultrasound showed no blood clots and CT scan showed no cancer per dr. Myna Hidalgo

## 2012-04-16 NOTE — Telephone Encounter (Addendum)
Message copied by Mirian Capuchin on Fri Apr 16, 2012 11:53 AM ------      Message from: Arlan Organ R      Created: Wed Apr 14, 2012  6:58 PM       Call - no cancer!!!  [facility] patient and left message on cell that both scans were good. ST

## 2012-04-16 NOTE — Telephone Encounter (Signed)
Message copied by Anselm Jungling on Fri Apr 16, 2012 11:15 AM ------      Message from: Arlan Organ R      Created: Wed Apr 14, 2012  6:58 PM       Call - no cancer!!!  pete

## 2012-04-16 NOTE — Telephone Encounter (Addendum)
Message copied by Mirian Capuchin on Fri Apr 16, 2012 11:55 AM ------      Message from: Arlan Organ R      Created: Wed Apr 14, 2012  6:58 PM       Call - no blood clots in legs!!!  [name]  Called patient and left message on cell that both scan were good. ST

## 2012-04-16 NOTE — Telephone Encounter (Signed)
Message copied by Anselm Jungling on Fri Apr 16, 2012 11:31 AM ------      Message from: Arlan Organ R      Created: Wed Apr 14, 2012  6:58 PM       Call - no blood clots in legs!!!  Mallory Morales

## 2012-04-20 ENCOUNTER — Encounter (INDEPENDENT_AMBULATORY_CARE_PROVIDER_SITE_OTHER): Payer: Self-pay | Admitting: General Surgery

## 2012-04-20 ENCOUNTER — Ambulatory Visit (INDEPENDENT_AMBULATORY_CARE_PROVIDER_SITE_OTHER): Payer: 59 | Admitting: General Surgery

## 2012-04-20 VITALS — BP 135/83 | HR 98 | Temp 97.4°F | Ht 65.0 in | Wt 149.4 lb

## 2012-04-20 DIAGNOSIS — C189 Malignant neoplasm of colon, unspecified: Secondary | ICD-10-CM

## 2012-04-20 NOTE — Patient Instructions (Signed)
Clean wound with dilute soapy water and apply a dry dressing daily.

## 2012-04-20 NOTE — Progress Notes (Signed)
Operation: Left colectomy and colostomy  Date: January 14, 2012  Pathology: Stage II colon cancer  HPI: She is here for another postoperative visit. Her postoperative course was complicated by DVT and pulmonary embolism despite appropriate prophylaxis for this. She is on Arixtra.  She developed another open, draining area in her midline wound and was started on Augmentin by Dr.  Biagio Quint.  She continues to have some drainage from that area. Recent CT of chest/abdomen/pelvis was negative for metastatic disease.   Physical Exam: Mallory Morales is in no acute distress  Abdomen-midline incision with superficial opening and granulation tissue in the upper mid portion with no purulent drainage; left sided colostomy looks good.  Assessment: Stage II obstructing colon cancer-another part of her wound opened and drained; the granulation tissue at this site was treated with silver nitrate.  Plan:  Clean wound with warm, soapy water daily and apply a dry dressing.  RTC one month.  She will need a colonoscopy prior to colostomy closure.

## 2012-04-26 ENCOUNTER — Ambulatory Visit (HOSPITAL_BASED_OUTPATIENT_CLINIC_OR_DEPARTMENT_OTHER): Payer: 59 | Admitting: Hematology & Oncology

## 2012-04-26 ENCOUNTER — Other Ambulatory Visit (HOSPITAL_BASED_OUTPATIENT_CLINIC_OR_DEPARTMENT_OTHER): Payer: 59 | Admitting: Lab

## 2012-04-26 VITALS — BP 130/84 | HR 77 | Temp 97.2°F | Ht 65.0 in | Wt 149.0 lb

## 2012-04-26 DIAGNOSIS — I2699 Other pulmonary embolism without acute cor pulmonale: Secondary | ICD-10-CM

## 2012-04-26 DIAGNOSIS — G8929 Other chronic pain: Secondary | ICD-10-CM

## 2012-04-26 DIAGNOSIS — C186 Malignant neoplasm of descending colon: Secondary | ICD-10-CM

## 2012-04-26 DIAGNOSIS — C187 Malignant neoplasm of sigmoid colon: Secondary | ICD-10-CM

## 2012-04-26 DIAGNOSIS — I82409 Acute embolism and thrombosis of unspecified deep veins of unspecified lower extremity: Secondary | ICD-10-CM

## 2012-04-26 DIAGNOSIS — D509 Iron deficiency anemia, unspecified: Secondary | ICD-10-CM

## 2012-04-26 LAB — CBC WITH DIFFERENTIAL (CANCER CENTER ONLY)
BASO%: 0.2 % (ref 0.0–2.0)
Eosinophils Absolute: 0.1 10*3/uL (ref 0.0–0.5)
LYMPH#: 2.2 10*3/uL (ref 0.9–3.3)
LYMPH%: 49.3 % — ABNORMAL HIGH (ref 14.0–48.0)
MCV: 89 fL (ref 81–101)
MONO#: 0.3 10*3/uL (ref 0.1–0.9)
NEUT#: 2 10*3/uL (ref 1.5–6.5)
Platelets: 156 10*3/uL (ref 145–400)
RBC: 4.21 10*6/uL (ref 3.70–5.32)
WBC: 4.5 10*3/uL (ref 3.9–10.0)

## 2012-04-26 LAB — IRON AND TIBC
Iron: 128 ug/dL (ref 42–145)
TIBC: 316 ug/dL (ref 250–470)
UIBC: 188 ug/dL (ref 125–400)

## 2012-04-26 LAB — FERRITIN: Ferritin: 372 ng/mL — ABNORMAL HIGH (ref 10–291)

## 2012-04-26 LAB — COMPREHENSIVE METABOLIC PANEL
ALT: 12 U/L (ref 0–35)
Albumin: 4.5 g/dL (ref 3.5–5.2)
CO2: 34 mEq/L — ABNORMAL HIGH (ref 19–32)
Calcium: 9.8 mg/dL (ref 8.4–10.5)
Chloride: 100 mEq/L (ref 96–112)
Glucose, Bld: 120 mg/dL — ABNORMAL HIGH (ref 70–99)
Sodium: 139 mEq/L (ref 135–145)
Total Bilirubin: 0.8 mg/dL (ref 0.3–1.2)
Total Protein: 7 g/dL (ref 6.0–8.3)

## 2012-04-26 LAB — CEA: CEA: 2.7 ng/mL (ref 0.0–5.0)

## 2012-04-26 LAB — LACTATE DEHYDROGENASE: LDH: 161 U/L (ref 94–250)

## 2012-04-26 NOTE — Progress Notes (Signed)
This office note has been dictated.

## 2012-04-27 NOTE — Progress Notes (Signed)
CC:   Bryan Lemma. Manus Gunning, M.D. Adolph Pollack, M.D. Jordan Hawks Elnoria Howard, MD  DIAGNOSES: 1. Stage II adenocarcinoma of the sigmoid colon (T3 N0 M0). 2. Pulmonary embolism/DVT, postop.  CURRENT THERAPY:  Arixtra 7.5 mg subcu daily.  INTERVAL HISTORY:  Mallory Morales comes in for followup.  She is doing well. We did go ahead and repeat her CT scans.  These were done on 04/14/2012. The CT scans did not show any evidence of recurrent colon cancer.  We also did Dopplers of her legs.  These were also done on 04/14/2012. Dopplers did not show any residual DVT.  When I saw her, her CEA was 2.4.  She is supposed to go back in July or so to have colostomy reversed.  She is still having some problems with some dyspnea.  Again, this might just be a residual from her pulmonary embolism.  I told her this may take a year or so to finally improve.  She has had no fevers, sweats or chills.  She has had no bleeding.  She has had no leg swelling.  She had no headache.  She has had some pain with the left hip.  There has been a chronic issue for her.  PHYSICAL EXAMINATION:  This is a well-developed, well-nourished black female in no obvious distress.  Vital signs:  Temperature of 97.2, pulse 77, respiratory rate 18, blood pressure 130/84.  Weight is 149.  Head and neck:  Normocephalic, atraumatic skull.  There are no ocular or oral lesions.  There are no palpable cervical or supraclavicular lymph nodes. Lungs:  Clear bilaterally.  There are no rales, wheezes or rhonchi. Cardiac:  Regular rate and rhythm with a normal S1 and S2.  There are no murmurs, rubs, or bruits.  Abdomen:  Soft with good bowel sounds.  There is no palpable abdominal mass.  She has a colostomy in the left lower quadrant.  She has no palpable hepatosplenomegaly.  Back: No tenderness over the spine, ribs, or hips.  Extremities:  No clubbing, cyanosis or edema.  No palpable venous cords are noted in the legs.  Neurological: No focal  neurological deficits.  LABORATORY STUDIES:  White cell count 4.5, hemoglobin 13.1, hematocrit 37.4, platelet count 156.  IMPRESSION:  Mallory Morales is a 56 year old African American female with stage II adenocarcinoma of the sigmoid colon.  I did send her tumor off for Oncotype assay.  Her 3 year risk of recurrence was 10%.  She had 36 lymph nodes that were sampled, which also is a good prognostic marker. She had no evidence of perineural or lymphovascular space invasion.  We will continue to follow her along.  I am not sure when her insurance company will let us do another CT scan on her.  It probably will not be for another 6 months.  I will plan to get her back in 3 months' time.  Will then see how she is doing and probably set her up with a CT scan sometime in the fall.    ______________________________ Josph Macho, M.D. PRE/MEDQ  D:  04/26/2012  T:  04/27/2012  Job:  [id number]

## 2012-05-19 ENCOUNTER — Encounter (INDEPENDENT_AMBULATORY_CARE_PROVIDER_SITE_OTHER): Payer: Self-pay | Admitting: General Surgery

## 2012-05-19 ENCOUNTER — Ambulatory Visit (INDEPENDENT_AMBULATORY_CARE_PROVIDER_SITE_OTHER): Payer: 59 | Admitting: General Surgery

## 2012-05-19 VITALS — BP 128/76 | HR 68 | Temp 96.8°F | Resp 12 | Ht 65.0 in | Wt 149.0 lb

## 2012-05-19 DIAGNOSIS — Z85038 Personal history of other malignant neoplasm of large intestine: Secondary | ICD-10-CM

## 2012-05-19 NOTE — Progress Notes (Signed)
Operation: Left colectomy and colostomy  Date: January 14, 2012  Pathology: Stage II colon cancer  HPI: She is here for a follow up visit. Her postoperative course was complicated by DVT and pulmonary embolism despite appropriate prophylaxis for this. She is on Arixtra.   She still has a small, superficial draining area in her midline wound.   Physical Exam: Mallory Morales is in no acute distress  Abdomen-midline incision with very small superficial opening with scant serous drainage; left sided colostomy looks good.  Assessment: Stage II obstructing colon cancer-wound almost completely healed.   Plan:  RTC one month at which time we will schedule her for completion colonoscopy.

## 2012-05-19 NOTE — Patient Instructions (Signed)
We will talk about scheduling your colonoscopy at your next visit.

## 2012-06-07 ENCOUNTER — Encounter: Payer: Self-pay | Admitting: *Deleted

## 2012-06-07 NOTE — Progress Notes (Signed)
Office notes from 01/31/12, 02/17/12 and 05/02/12 faxed to [place] at [facility] as requested.

## 2012-06-17 ENCOUNTER — Ambulatory Visit (INDEPENDENT_AMBULATORY_CARE_PROVIDER_SITE_OTHER): Payer: 59 | Admitting: General Surgery

## 2012-06-17 ENCOUNTER — Encounter (INDEPENDENT_AMBULATORY_CARE_PROVIDER_SITE_OTHER): Payer: Self-pay | Admitting: General Surgery

## 2012-06-17 VITALS — BP 121/84 | HR 92 | Temp 96.9°F | Ht 65.0 in | Wt 148.4 lb

## 2012-06-17 DIAGNOSIS — C186 Malignant neoplasm of descending colon: Secondary | ICD-10-CM

## 2012-06-17 NOTE — Progress Notes (Signed)
Patient ID: Mallory Morales, female   DOB: November 17, 1956, 56 y.o.   MRN: [id number]  Chief Complaint  Patient presents with  . Follow-up    reck colon resection    HPI Mallory Morales is a 56 y.o. female.   HPI  She is here for followup of her descending colon cancer status post urgent descending colectomy with colostomy for obstructing cancer. This was complicated by DVT and pulmonary emboli. She continues to be treated for her pulmonary emboli. She states the wound has completely healed now. She had her colonoscopy today by Dr. Elnoria Howard and had a small polyp removed and sent for pathology.  Past Medical History  Diagnosis Date  . Hypertension   . Hyperlipemia   . Anemia, iron deficiency 01/27/2012  . Colon cancer   . Clotting disorder   . Pulmonary embolism   . DVT (deep venous thrombosis)     Past Surgical History  Procedure Date  . Abdominal hysterectomy   . Hernia repair   . Colon resection 01/14/2012    Procedure: COLON RESECTION;  Surgeon: Adolph Pollack, MD;  Location: [facility] OR;  Service: General;  Laterality: N/A;  left colectomy, mobilization of splenic flexure, ostomy.  . Colostomy   . Colon surgery     Family History  Problem Relation Age of Onset  . Heart attack Father   . Hypertension Father   . Heart disease Father   . Stomach cancer Sister   . Pancreatic cancer Brother   . Cancer Brother     pancreatic    Social History History  Substance Use Topics  . Smoking status: Former Smoker    Quit date: 05/16/1985  . Smokeless tobacco: Never Used  . Alcohol Use: Yes     Quit drinking after hemicolectomy     Allergies  Allergen Reactions  . Strawberry Swelling    Face only. Breathing not affected.    Current Outpatient Prescriptions  Medication Sig Dispense Refill  . acetaminophen (TYLENOL) 500 MG tablet Take 500 mg by mouth every 6 (six) hours as needed. For pain      . fondaparinux (ARIXTRA) 7.5 MG/0.6ML SOLN Inject 0.6 mLs (7.5 mg total) into the skin  daily.  18 mL  6  . hydrochlorothiazide (HYDRODIURIL) 25 MG tablet Take 1 tablet (25 mg total) by mouth daily.  30 tablet  0  . Probiotic Product (ALIGN) 4 MG CAPS Take by mouth.      . simvastatin (ZOCOR) 40 MG tablet Take 1 tablet (40 mg total) by mouth daily.  30 tablet  0    Review of Systems Review of Systems  Constitutional: Negative.   Gastrointestinal: Negative for abdominal pain and abdominal distention.    Blood pressure 121/84, pulse 92, temperature 96.9 F (36.1 C), temperature source Temporal, height 5\' 5"  (1.651 m), weight 148 lb 6.4 oz (67.314 kg), SpO2 98.00%.  Physical Exam Physical Exam  Constitutional: She appears well-developed and well-nourished. No distress.  HENT:  Head: Normocephalic and atraumatic.  Eyes: No scleral icterus.  Abdominal: Soft. She exhibits no distension and no mass. There is no tenderness.       Well healed midline incision.  LUQ colostomy.    Data Reviewed   Assessment    Stage II obstructing left colon cancer s/p descending colectomy and colostomy.  We discussed colostomy closure at length. I went over the procedure and risks with her.  We discussed perioperative management of her anticoagulation is well.  At this time, she  is not sure she wants to proceed until she is off her anticoagulation. She asked me if she definitely had to have the colostomy reversed and I told her that it was her choice.     Plan    I asked her to call as if she would like to proceed with colostomy closure in the future. Return visit with me p.r.n.       Jhaniya Briski J 06/17/2012, 5:17 PM

## 2012-06-17 NOTE — Patient Instructions (Signed)
Call us when you decide what you would like to do about reversing the colostomy.

## 2012-07-27 ENCOUNTER — Other Ambulatory Visit (HOSPITAL_BASED_OUTPATIENT_CLINIC_OR_DEPARTMENT_OTHER): Payer: 59 | Admitting: Lab

## 2012-07-27 ENCOUNTER — Ambulatory Visit (HOSPITAL_BASED_OUTPATIENT_CLINIC_OR_DEPARTMENT_OTHER): Payer: 59 | Admitting: Medical

## 2012-07-27 VITALS — BP 115/69 | HR 82 | Temp 97.0°F | Resp 20 | Ht 65.0 in | Wt 154.0 lb

## 2012-07-27 DIAGNOSIS — C187 Malignant neoplasm of sigmoid colon: Secondary | ICD-10-CM

## 2012-07-27 DIAGNOSIS — C189 Malignant neoplasm of colon, unspecified: Secondary | ICD-10-CM

## 2012-07-27 DIAGNOSIS — C186 Malignant neoplasm of descending colon: Secondary | ICD-10-CM

## 2012-07-27 DIAGNOSIS — I82409 Acute embolism and thrombosis of unspecified deep veins of unspecified lower extremity: Secondary | ICD-10-CM

## 2012-07-27 DIAGNOSIS — D509 Iron deficiency anemia, unspecified: Secondary | ICD-10-CM

## 2012-07-27 DIAGNOSIS — Z86718 Personal history of other venous thrombosis and embolism: Secondary | ICD-10-CM

## 2012-07-27 DIAGNOSIS — Z86711 Personal history of pulmonary embolism: Secondary | ICD-10-CM

## 2012-07-27 LAB — CBC WITH DIFFERENTIAL (CANCER CENTER ONLY)
BASO#: 0 10*3/uL (ref 0.0–0.2)
EOS%: 4.2 % (ref 0.0–7.0)
HGB: 13.4 g/dL (ref 11.6–15.9)
MCH: 32.2 pg (ref 26.0–34.0)
MCHC: 35.7 g/dL (ref 32.0–36.0)
MONO%: 6.8 % (ref 0.0–13.0)
NEUT#: 1.9 10*3/uL (ref 1.5–6.5)

## 2012-07-27 LAB — IRON AND TIBC
%SAT: 42 % (ref 20–55)
Iron: 131 ug/dL (ref 42–145)
TIBC: 315 ug/dL (ref 250–470)
UIBC: 184 ug/dL (ref 125–400)

## 2012-07-27 LAB — COMPREHENSIVE METABOLIC PANEL
Alkaline Phosphatase: 58 U/L (ref 39–117)
BUN: 14 mg/dL (ref 6–23)
Creatinine, Ser: 0.79 mg/dL (ref 0.50–1.10)
Glucose, Bld: 123 mg/dL — ABNORMAL HIGH (ref 70–99)
Sodium: 142 mEq/L (ref 135–145)
Total Bilirubin: 1.3 mg/dL — ABNORMAL HIGH (ref 0.3–1.2)

## 2012-07-27 LAB — CEA: CEA: 3.1 ng/mL (ref 0.0–5.0)

## 2012-07-27 NOTE — Progress Notes (Signed)
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 #2 pulmonary embolism/DVT, postop.  Current therapy:  #1 active observation for the colon cancer. #2 Arixtra 7.5 mg subcutaneous daily x 1 year for the history of pulmonary embolism.  Interim history: Mallory Morales presents today for an office followup visit.  Her husband accompanies her.  Overall, Mallory Morales, reports, that she's been doing quite well.  She did have repeat CT scans back on 04/14/2012, which did not show any evidence of recurrent colon cancer.  She also had Dopplers of her legs done as well which did not show any residual DVT.  She has been on the Arixtra 7.5 mg subcutaneous daily, really, without any problems.  She's not having any problems with dyspnea.  She's not having any obvious, bleeding.  She's not having any cough, chest pain, she's not reporting any nausea, vomiting, diarrhea, or constipation.  She denies any fevers, chills, or night sweats.  Her energy level is good.  She doesn't have any headaches or blurry vision.  She denies any type of leg swelling.  She does have some pain, or locking of the joint, with the left hip.  This has been chronic for her.  Her last CEA back in may was 2.7.  Mallory Morales did recently see Mallory Morales in bowel or for reversal of her colostomy and he feels, that she should complete her course of her Arixtra before proceeding with her colostomy, reversal.  She should be done with the Arixtra in January.  Of note, Mallory Morales just recently had her colonoscopy, done by Mallory Morales in June of 2013.  She follows up with Mallory Morales for colonoscopies every 3 years.  Review of Systems: Pt. Denies any changes in their vision, hearing, adenopathy, fevers, chills, nausea, vomiting, diarrhea, constipation, chest pain, shortness of breath, passing blood, passing out, blacking out,   any changes in skin, joints, neurologic or psychiatric except as noted.  Physical Exam: This is a 56 year old, well-developed, well-nourished, African American, female, in no obvious distress Vitals: Temperature 97.0 degrees, pulse 82, respirations 20, blood pressure 115/69, weight 154 pounds HEENT reveals a normocephalic, atraumatic skull, no scleral icterus, no oral lesions  Neck is supple without any cervical or supraclavicular adenopathy.  Lungs are clear to auscultation bilaterally. There are no wheezes, rales or rhonci Cardiac is regular rate and rhythm with a normal S1 and S2. There are no murmurs, rubs, or bruits.  Abdomen is soft with good bowel sounds there's a palpable mass. There is no palpable hepatosplenomegaly. There is no palpable fluid wave. +colostomy bag Musculoskeletal no tenderness of the spine, ribs, or hips.  Extremities there are no clubbing, cyanosis, or edema.  Skin no petechia, purpura or ecchymosis Neurologic is nonfocal.  Laboratory Data: White count 3.8, hemoglobin 13.4, hematocrit 37.5.  Platelets 141,000 CEA from today is currently pending  Current Outpatient Prescriptions on File Prior to Visit  Medication Sig Dispense Refill  . acetaminophen (TYLENOL) 500 MG tablet Take 500 mg by mouth every 6 (six) hours as needed. For pain      . fondaparinux (ARIXTRA) 7.5 MG/0.6ML SOLN Inject 0.6 mLs (7.5 mg total) into the skin daily.  18 mL  6  . hydrochlorothiazide (HYDRODIURIL) 25 MG tablet Take 1 tablet (25 mg total) by mouth daily.  30 tablet  0  . Probiotic Product (ALIGN) 4 MG CAPS  Take by mouth as needed.       . simvastatin (ZOCOR) 40 MG tablet Take 1 tablet (40 mg total) by mouth daily.  30 tablet  0   Assessment/Plan: This is a pleasant, 56 year old, female, with the following issues: #1.  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  Her tumor was sent off for Oncotype assay.  Her 3 year risk of recurrence was 10%.  She had 36 lymph nodes that  were sampled, which also is a good prognostic marker.  She had no evidence of perineural or lymphovascular space invasion.  She will continue to followup with Mallory Morales every 3 years for colonoscopy.  Her last colonoscopy was June 2013.  We will go ahead and repeat another CT scan of the chest, abdomen, and pelvis in November.  #2 history of pulmonary embolism/DVT, postop-she currently is on Arixtra 7.5 mg subcutaneous daily x1 year.  This should, and in January, 2014.  #3 followup-[name] will follow back up with [place] in 3 months, but before then should there be questions or concerns.

## 2012-07-29 ENCOUNTER — Telehealth: Payer: Self-pay | Admitting: *Deleted

## 2012-07-29 ENCOUNTER — Ambulatory Visit: Payer: 59 | Admitting: Hematology & Oncology

## 2012-07-29 ENCOUNTER — Other Ambulatory Visit: Payer: 59 | Admitting: Lab

## 2012-07-29 NOTE — Telephone Encounter (Signed)
Message copied by Anselm Jungling on Thu Jul 29, 2012 10:31 AM ------      Message from: Josph Macho      Created: Tue Jul 27, 2012  5:32 PM       Please call and tell her that her labs including her iron and tumor marker all normal. Thanks. Cindee Lame

## 2012-07-29 NOTE — Telephone Encounter (Signed)
Called patient to let her know that her iron and labs and tumor markers were all normal per dr. Myna Hidalgo

## 2012-08-25 ENCOUNTER — Other Ambulatory Visit: Payer: Self-pay | Admitting: *Deleted

## 2012-08-25 DIAGNOSIS — I2699 Other pulmonary embolism without acute cor pulmonale: Secondary | ICD-10-CM

## 2012-08-25 MED ORDER — FONDAPARINUX SODIUM 7.5 MG/0.6ML ~~LOC~~ SOLN: 7.5000 mg | SUBCUTANEOUS | Status: DC

## 2012-08-30 ENCOUNTER — Ambulatory Visit: Payer: 59 | Admitting: Hematology & Oncology

## 2012-08-30 ENCOUNTER — Other Ambulatory Visit: Payer: 59 | Admitting: Lab

## 2012-10-06 ENCOUNTER — Encounter (INDEPENDENT_AMBULATORY_CARE_PROVIDER_SITE_OTHER): Payer: 59 | Admitting: General Surgery

## 2012-10-25 ENCOUNTER — Ambulatory Visit (HOSPITAL_BASED_OUTPATIENT_CLINIC_OR_DEPARTMENT_OTHER)
Admission: RE | Admit: 2012-10-25 | Discharge: 2012-10-25 | Disposition: A | Discharge status: Home / Self Care | Payer: 59 | Source: Ambulatory Visit | Attending: Medical | Admitting: Medical

## 2012-10-25 DIAGNOSIS — C189 Malignant neoplasm of colon, unspecified: Secondary | ICD-10-CM

## 2012-10-25 DIAGNOSIS — Z86711 Personal history of pulmonary embolism: Secondary | ICD-10-CM | POA: Insufficient documentation

## 2012-10-25 MED ORDER — IOHEXOL 300 MG/ML  SOLN
100.0000 mL | Freq: Once | INTRAMUSCULAR | Status: AC | PRN
  Administered 2012-10-25: 100 mL via INTRAVENOUS

## 2012-10-27 ENCOUNTER — Ambulatory Visit (HOSPITAL_BASED_OUTPATIENT_CLINIC_OR_DEPARTMENT_OTHER): Payer: 59 | Admitting: Hematology & Oncology

## 2012-10-27 ENCOUNTER — Other Ambulatory Visit (HOSPITAL_BASED_OUTPATIENT_CLINIC_OR_DEPARTMENT_OTHER): Payer: 59 | Admitting: Lab

## 2012-10-27 VITALS — BP 149/79 | HR 84 | Temp 97.9°F | Resp 16 | Ht 65.0 in | Wt 161.0 lb

## 2012-10-27 DIAGNOSIS — C187 Malignant neoplasm of sigmoid colon: Secondary | ICD-10-CM

## 2012-10-27 DIAGNOSIS — C189 Malignant neoplasm of colon, unspecified: Secondary | ICD-10-CM

## 2012-10-27 DIAGNOSIS — C186 Malignant neoplasm of descending colon: Secondary | ICD-10-CM

## 2012-10-27 DIAGNOSIS — I2699 Other pulmonary embolism without acute cor pulmonale: Secondary | ICD-10-CM

## 2012-10-27 LAB — CBC WITH DIFFERENTIAL (CANCER CENTER ONLY)
BASO%: 0.2 % (ref 0.0–2.0)
EOS%: 0.9 % (ref 0.0–7.0)
HCT: 38.1 % (ref 34.8–46.6)
LYMPH#: 1.6 10*3/uL (ref 0.9–3.3)
LYMPH%: 35.7 % (ref 14.0–48.0)
MCHC: 36 g/dL (ref 32.0–36.0)
MCV: 89 fL (ref 81–101)
NEUT%: 57.8 % (ref 39.6–80.0)
Platelets: 155 10*3/uL (ref 145–400)
RDW: 11.9 % (ref 11.1–15.7)
WBC: 4.6 10*3/uL (ref 3.9–10.0)

## 2012-10-27 LAB — COMPREHENSIVE METABOLIC PANEL
ALT: 14 U/L (ref 0–35)
AST: 19 U/L (ref 0–37)
Alkaline Phosphatase: 60 U/L (ref 39–117)
BUN: 14 mg/dL (ref 6–23)
Creatinine, Ser: 0.76 mg/dL (ref 0.50–1.10)

## 2012-10-27 LAB — IRON AND TIBC
TIBC: 364 ug/dL (ref 250–470)
UIBC: 280 ug/dL (ref 125–400)

## 2012-10-27 LAB — CEA: CEA: 3.7 ng/mL (ref 0.0–5.0)

## 2012-10-27 LAB — LACTATE DEHYDROGENASE: LDH: 191 U/L (ref 94–250)

## 2012-10-27 NOTE — Progress Notes (Signed)
This office note has been dictated.

## 2012-10-28 NOTE — Progress Notes (Signed)
CC:   Mallory Morales. Mallory Morales, M.D. Adolph Pollack, M.D.  DIAGNOSIS: 1. Stage II adenocarcinoma of the sigmoid colon (T3 N0 M0). 2. Deep venous thrombosis.  CURRENT THERAPY:  Arixtra 7.5 mg subcu daily-to finish up in January.  INTERIM HISTORY:  Mallory Morales comes in for followup.  She feels well. She has had no complaints since we last saw her.  She does have some back and hip issues.  She is wondering is this is from the Arixtra.  I told that it really was not. She is going to have her colostomy reversed.  She will have this done in January or February after she finishes the Arixtra. We did go ahead and do a repeat PET scan on her.  The CT scan was done on 10/25/2012.  The CT scan did not show any evidence of recurrent colon cancer or metastatic colon cancer.  She has some 3 mm lung nodules which are stable. She is worried about her husband.  He has metastatic biliary duct cancer.  He is being treated over at [facility]. Her last CEA was 3.1.  The patient had no problems with cough, shortness of breath.  There is no bleeding.  She has had no problems with colostomy.  There has been no leg swelling.  PHYSICAL EXAMINATION:  General:  This is a well-developed, well- nourished African American female in no obvious distress.  Vital signs: Temperature of 97.9, pulse 84, respiratory rate 18, blood pressure 149/79.  Weight is 161.  Head and neck:  Normocephalic, atraumatic skull.  There are no ocular or oral lesions.  There are no palpable cervical or supraclavicular lymph nodes.  Lungs:  Clear bilaterally. Cardiac:  Regular rate and rhythm with a normal S1 and S2.  There are no murmurs, rubs or bruits.  Abdomen:  Soft with good bowel sounds.  There is no palpable abdominal mass.  There are no problems with her colostomy.  Her laparotomy scar is well healed.  She has no palpable hepatomegaly.  Extremities:  Shows no clubbing, cyanosis or edema. Back:  No tenderness over the spine, ribs, or  hips.  Neurologic:  Shows no focal neurological deficit.  LABORATORY STUDIES:  White cell count 4.6, hemoglobin 13.7, hematocrit 38.1, platelet count 155.  IMPRESSION:  [name] is a very nice 56 year old African American female with stage II (T3 N0 M0) adenocarcinoma of the sigmoid colon. She had this resected.  She had 36 lymph nodes that were negative. __________ score was 20, which gave her 10% of recurrence risk at 3 years. We will go ahead and plan for a followup CT scan in January.  I will then get her to surgery for reversal of the colostomy. She will continue the Arixtra until January is complete.  I will plan to see her back after her scan is done in January.    ______________________________ Josph Macho, M.D. PRE/MEDQ  D:  10/27/2012  T:  10/28/2012  Job:  [id number]

## 2012-12-07 ENCOUNTER — Other Ambulatory Visit: Payer: Self-pay | Admitting: Hematology & Oncology

## 2012-12-31 ENCOUNTER — Telehealth: Payer: Self-pay | Admitting: Hematology & Oncology

## 2012-12-31 NOTE — Telephone Encounter (Signed)
Eber Jones from Radiology called and said pt's CT Abd/Pelvis was changed to with from with and without and wanted to know if that changed the precert. I talked with Wille Glaser and he said it does not. Carolyn aware. Per Wille Glaser I left him a note.

## 2013-01-03 ENCOUNTER — Ambulatory Visit (HOSPITAL_BASED_OUTPATIENT_CLINIC_OR_DEPARTMENT_OTHER)
Admission: RE | Admit: 2013-01-03 | Discharge: 2013-01-03 | Disposition: A | Discharge status: Home / Self Care | Payer: 59 | Source: Ambulatory Visit | Attending: Hematology & Oncology | Admitting: Hematology & Oncology

## 2013-01-03 DIAGNOSIS — Z86711 Personal history of pulmonary embolism: Secondary | ICD-10-CM | POA: Insufficient documentation

## 2013-01-03 DIAGNOSIS — I2699 Other pulmonary embolism without acute cor pulmonale: Secondary | ICD-10-CM

## 2013-01-03 DIAGNOSIS — C186 Malignant neoplasm of descending colon: Secondary | ICD-10-CM

## 2013-01-03 MED ORDER — IOHEXOL 350 MG/ML SOLN: 100.0000 mL | Freq: Once | INTRAVENOUS | Status: DC | PRN

## 2013-01-05 ENCOUNTER — Other Ambulatory Visit (HOSPITAL_BASED_OUTPATIENT_CLINIC_OR_DEPARTMENT_OTHER): Payer: 59

## 2013-01-06 ENCOUNTER — Telehealth: Payer: Self-pay | Admitting: *Deleted

## 2013-01-06 NOTE — Telephone Encounter (Signed)
Called patient to let her know that there was no cancer or blood clots in lung on her CT scan

## 2013-01-06 NOTE — Telephone Encounter (Signed)
Message copied by Anselm Jungling on Thu Jan 06, 2013 10:40 AM ------      Message from: Arlan Organ R      Created: Wed Jan 05, 2013  7:42 AM       Call - NO cancer or blood clots in the lung!!  Happy New Year!!  [name] E.

## 2013-01-10 ENCOUNTER — Other Ambulatory Visit (HOSPITAL_BASED_OUTPATIENT_CLINIC_OR_DEPARTMENT_OTHER): Payer: 59 | Admitting: Lab

## 2013-01-10 ENCOUNTER — Ambulatory Visit (HOSPITAL_BASED_OUTPATIENT_CLINIC_OR_DEPARTMENT_OTHER): Payer: 59 | Admitting: Hematology & Oncology

## 2013-01-10 VITALS — BP 121/82 | HR 83 | Temp 97.7°F | Resp 16

## 2013-01-10 DIAGNOSIS — Z86718 Personal history of other venous thrombosis and embolism: Secondary | ICD-10-CM

## 2013-01-10 DIAGNOSIS — Z85038 Personal history of other malignant neoplasm of large intestine: Secondary | ICD-10-CM

## 2013-01-10 DIAGNOSIS — I2699 Other pulmonary embolism without acute cor pulmonale: Secondary | ICD-10-CM

## 2013-01-10 DIAGNOSIS — C186 Malignant neoplasm of descending colon: Secondary | ICD-10-CM

## 2013-01-10 LAB — COMPREHENSIVE METABOLIC PANEL
Albumin: 4.8 g/dL (ref 3.5–5.2)
Alkaline Phosphatase: 61 U/L (ref 39–117)
BUN: 13 mg/dL (ref 6–23)
Calcium: 9.8 mg/dL (ref 8.4–10.5)
Chloride: 102 mEq/L (ref 96–112)
Creatinine, Ser: 0.81 mg/dL (ref 0.50–1.10)
Glucose, Bld: 90 mg/dL (ref 70–99)
Potassium: 3.9 mEq/L (ref 3.5–5.3)

## 2013-01-10 LAB — CBC WITH DIFFERENTIAL (CANCER CENTER ONLY)
BASO#: 0 10*3/uL (ref 0.0–0.2)
EOS%: 2.3 % (ref 0.0–7.0)
HCT: 39.1 % (ref 34.8–46.6)
HGB: 13.9 g/dL (ref 11.6–15.9)
LYMPH#: 1.5 10*3/uL (ref 0.9–3.3)
LYMPH%: 34 % (ref 14.0–48.0)
MCHC: 35.5 g/dL (ref 32.0–36.0)
MCV: 89 fL (ref 81–101)
NEUT%: 57.3 % (ref 39.6–80.0)

## 2013-01-10 NOTE — Progress Notes (Signed)
CC:   Mallory Morales, M.D. Mallory Morales. Mallory Morales, M.D.  DIAGNOSES: 1. Stage II adenocarcinoma of the sigmoid colon. 2. Deep vein thrombosis-postop.  CURRENT THERAPY: 1. Observation.  INTERIM HISTORY:  Mallory Morales comes in for her followup.  She completed her Arixtra a couple of weeks ago.  She had it for a year.  I felt that this was appropriate for her as she had not had a thromboembolic event previously.  She is worried that her husband is not doing well.  He is over in the hospital at [facility].  He has metastatic colon cancer and had biliary sepsis after a percutaneous drainage tube was placed.  Mallory Morales is looking forward to having her colostomy reversed.  She is going to see Dr. Abbey Chatters in a couple of weeks.  She had a CT scan done on January 13th.  The CT scan did not show any evidence of recurrent disease.  There is no evidence of a pulmonary embolism or, I think, DVT.  Her appetite has been good.  She has had no problems with her colostomy. Again, she is looking forward to having this reversed.  There has been no leg swelling.  Her last CEA was 3.7.  PHYSICAL EXAMINATION:  General:  This is a well-developed, well- nourished black female in no obvious distress.  Vital signs: Temperature of 97.7, pulse 83, respiratory rate 16, blood pressure 121/82.  Weight is 165.  Head and neck:  Normocephalic, atraumatic skull.  There are no ocular or oral lesions.  There are no palpable cervical or supraclavicular lymph nodes.  Lungs:  Clear bilaterally. Cardiac:  Regular rate and rhythm with a normal S1 and S2.  There are no murmurs, rubs, or bruits.  Abdomen:  Soft with good bowel sounds.  Her colostomy is in the left lower quadrant.  She has a well-healed laparotomy scar, maybe a little bit of a keloid with the laparotomy. There is no fluid wave.  There is no palpable hepatosplenomegaly.  Back: No tenderness over the spine, ribs, or hips.  Extremities:  No  clubbing, cyanosis, or edema.  No palpable venous cords noted in her legs.  LABORATORY STUDIES:  White cell count is 4.4, hemoglobin 13.9, hematocrit 39.1, platelet count 169.  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  She underwent resection back in January 2013.  She had 36 lymph nodes that were resected.  Her Oncotype score was 20.  This gave her a low risk of recurrence.  As such, I felt that she would not benefit from adjuvant chemotherapy.  We still need to follow her along with scans.  I want to do another scan on her in May.  I think we can go every 4 months now.  I do not see that anything special needs to be done with respect to anticoagulation after her surgery outside of routine postop prophylactic anticoagulation.    ______________________________ Josph Macho, M.D. PRE/MEDQ  D:  01/10/2013  T:  01/10/2013  Job:  [id number]

## 2013-01-10 NOTE — Progress Notes (Signed)
This office note has been dictated.

## 2013-01-17 ENCOUNTER — Telehealth: Payer: Self-pay | Admitting: Hematology & Oncology

## 2013-01-17 NOTE — Telephone Encounter (Addendum)
Message copied by Cathi Roan on Mon Jan 17, 2013  4:13 PM ------      Message from: Josph Macho      Created: Tue Jan 11, 2013  7:25 AM       Call - labs are all ok!!  Cindee Lame  01-17-13-- 4:15pm    Called patient on home phone and left message regarding above MD message, advised if any questions to call office. Lupita Raider LPN

## 2013-01-24 ENCOUNTER — Ambulatory Visit (INDEPENDENT_AMBULATORY_CARE_PROVIDER_SITE_OTHER): Payer: 59 | Admitting: General Surgery

## 2013-01-24 ENCOUNTER — Encounter (INDEPENDENT_AMBULATORY_CARE_PROVIDER_SITE_OTHER): Payer: Self-pay

## 2013-01-24 ENCOUNTER — Encounter (INDEPENDENT_AMBULATORY_CARE_PROVIDER_SITE_OTHER): Payer: Self-pay | Admitting: General Surgery

## 2013-01-24 VITALS — BP 130/80 | HR 72 | Temp 98.6°F | Resp 18 | Ht 64.0 in | Wt 170.4 lb

## 2013-01-24 DIAGNOSIS — Z933 Colostomy status: Secondary | ICD-10-CM | POA: Insufficient documentation

## 2013-01-24 NOTE — Patient Instructions (Signed)
[facility]  ONE-DAY (1) PRE-OP HOME COLON PREP INSTRUCTIONS: ** MIRALAX / GATORADE PREP **  Fill the two prescriptions at a pharmacy of your choice.  You must follow the instructions below carefully.  If you have questions or problems, please call and speak to someone in the clinic department at our office:   [phone].  MIRALAX - GATORADE -- DULCOLAX TABS:   Fill the prescriptions for MIRALAX  (255 gm bottle)    In addition, purchase four (4) DULCOLAX TABLETS (no prescription required), and one 64 oz GATORADE.  (Do NOT purchase red Gatorade; any other flavor is acceptable).  ANITIBIOTICS:   There will be 2 different antibiotics.     Take both prescriptions THE AFTERNOON BEFORE your surgery, at the times written on the bottles.  INSTRUCTIONS: 1. Five days prior to your procedure do not eat nuts, popcorn, or fruit with seeds.  Stop all fiber supplements such as Metamucil, Citrucel, etc.  2. The day before your procedure: o 6:00am:  take (4) Dulcolax tablets.  You should remain on clear liquids for the entire day.   CLEAR LIQUIDS: clear bouillon, broth, jello (NOT RED), black coffee, tea, soda, etc o 10:00am:  add the bottle of MiraLax to the 64-oz bottle of Gatorade, and dissolve.  Begin drinking the Gatorade mixture until gone (8 oz every 15-30 minutes).  Continue clear liquids until midnight (or bedtime). o Take the antibiotics at the times instructed on the bottles.  3. The day of your procedure:   Do not eat or drink ANYTHING after midnight before your surgery.     If you take Heart or Blood Pressure medicine, ask the pre-op nurses about these during your preop appointment.   Further pre-operative instructions will be given to you from the hospital.   Expect to be contacted 5-7 days before your surgery.      You may be out of work for up to 6 weeks after the surgery.  High fiber diet.

## 2013-01-24 NOTE — Progress Notes (Signed)
Patient ID: Mallory Morales, female   DOB: 1956/11/11, 57 y.o.   MRN: [id number]  No chief complaint on file.   HPI Mallory Morales is a 57 y.o. female.   HPI  Mallory Morales is here to discuss colostomy closure. She has completed the anticoagulation treatment for her DVT and pulmonary embolism. Recent CT scan did not demonstrate any evidence of metastatic colon cancer. She has gained some weight. She's not having any problems with the colostomy. Her husband has been diagnosed with bile duct cancer and is being treated at [facility].  Past Medical History  Diagnosis Date  . Hypertension   . Hyperlipemia   . Anemia, iron deficiency 01/27/2012  . Colon cancer   . Clotting disorder   . Pulmonary embolism   . DVT (deep venous thrombosis)     Past Surgical History  Procedure Date  . Abdominal hysterectomy   . Hernia repair   . Colon resection 01/14/2012    Procedure: COLON RESECTION;  Surgeon: Adolph Pollack, MD;  Location: [facility] OR;  Service: General;  Laterality: N/A;  left colectomy, mobilization of splenic flexure, ostomy.  . Colostomy   . Colon surgery     Family History  Problem Relation Age of Onset  . Heart attack Father   . Hypertension Father   . Heart disease Father   . Stomach cancer Sister   . Pancreatic cancer Brother   . Cancer Brother     pancreatic    Social History History  Substance Use Topics  . Smoking status: Former Smoker    Quit date: 05/16/1985  . Smokeless tobacco: Never Used  . Alcohol Use: Yes     Comment: Quit drinking after hemicolectomy     Allergies  Allergen Reactions  . Strawberry Swelling    Face only. Breathing not affected.    Current Outpatient Prescriptions  Medication Sig Dispense Refill  . acetaminophen (TYLENOL) 500 MG tablet Take 500 mg by mouth every 6 (six) hours as needed. For pain      . hydrochlorothiazide (HYDRODIURIL) 25 MG tablet Take 1 tablet (25 mg total) by mouth daily.  30 tablet  0  .  simvastatin (ZOCOR) 40 MG tablet Take 1 tablet (40 mg total) by mouth daily.  30 tablet  0  . fondaparinux (ARIXTRA) 7.5 MG/0.6ML SOLN INJECT 1 SYRINGE SUBCUTANEOUSLY EVERY DAY  18 Syringe  3    Review of Systems Review of Systems  Constitutional: Negative.   Respiratory: Negative.   Gastrointestinal: Negative.     Blood pressure 130/80, pulse 72, temperature 98.6 F (37 C), temperature source Temporal, resp. rate 18, height 5\' 4"  (1.626 m), weight 170 lb 6.4 oz (77.293 kg).  Physical Exam Physical Exam  Constitutional:       Overweight female in no acute distress.  HENT:  Head: Normocephalic and atraumatic.  Eyes: Conjunctivae normal are normal. Pupils are equal, round, and reactive to light.  Cardiovascular: Normal rate and regular rhythm.   Pulmonary/Chest: Effort normal and breath sounds normal.  Abdominal: Soft. She exhibits no mass. There is no tenderness.       Midline scar. Left midabdominal colostomy.  Musculoskeletal: She exhibits no edema.    Data Reviewed Old notes. CT scan.  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 No evidence of recurrent cancer at this time. I think she is a good candidate for reversal of the colostomy.  Plan    Laparoscopic-assisted colostomy closure.  I have explained the procedure and risks of colostomy closure.  Risks include but are not limited to bleeding, infection, wound problems, anesthesia, anastomotic leak, need for another colostomy, injury to intraabominal organs (such as intestine, spleen, kidney, bladder, ureter, etc.), irregular bowel habits.  He/she seems to understand and would like to proceed.       Wynston Romey J 01/24/2013, 2:29 PM

## 2013-02-11 ENCOUNTER — Encounter (HOSPITAL_COMMUNITY): Payer: Self-pay | Admitting: Pharmacy Technician

## 2013-02-14 ENCOUNTER — Telehealth (INDEPENDENT_AMBULATORY_CARE_PROVIDER_SITE_OTHER): Payer: Self-pay

## 2013-02-14 NOTE — Telephone Encounter (Signed)
  The pt called to see how long she will be in the hospital after ostomy reversal.  She has to put her husband in Hospice while she is there and they will only keep him 5 days.  I told her 3 to 5 days but I need to check with Dr Abbey Chatters to be sure.  Please call.

## 2013-02-15 ENCOUNTER — Telehealth (INDEPENDENT_AMBULATORY_CARE_PROVIDER_SITE_OTHER): Payer: Self-pay

## 2013-02-15 NOTE — Telephone Encounter (Signed)
Pt given instructions to p/u pre-op antibiotics at [facility],  and Miralax one day bowel prep.

## 2013-02-17 ENCOUNTER — Encounter (HOSPITAL_COMMUNITY)
Admission: RE | Admit: 2013-02-17 | Discharge: 2013-02-17 | Disposition: A | Discharge status: Home / Self Care | Payer: 59 | Source: Ambulatory Visit | Attending: General Surgery | Admitting: General Surgery

## 2013-02-17 ENCOUNTER — Encounter (HOSPITAL_COMMUNITY): Payer: Self-pay

## 2013-02-17 HISTORY — DX: Other complications of anesthesia, initial encounter: T88.59XA

## 2013-02-17 HISTORY — DX: Adverse effect of unspecified anesthetic, initial encounter: T41.45XA

## 2013-02-17 LAB — PROTIME-INR: Prothrombin Time: 12.7 seconds (ref 11.6–15.2)

## 2013-02-17 LAB — CBC WITH DIFFERENTIAL/PLATELET
Eosinophils Absolute: 0 10*3/uL (ref 0.0–0.7)
Eosinophils Relative: 1 % (ref 0–5)
Lymphocytes Relative: 31 % (ref 12–46)
Lymphs Abs: 1.2 10*3/uL (ref 0.7–4.0)
MCHC: 35.3 g/dL (ref 30.0–36.0)
Monocytes Absolute: 0.2 10*3/uL (ref 0.1–1.0)
Monocytes Relative: 5 % (ref 3–12)
Platelets: 157 10*3/uL (ref 150–400)
RDW: 12.1 % (ref 11.5–15.5)

## 2013-02-17 LAB — COMPREHENSIVE METABOLIC PANEL
ALT: 12 U/L (ref 0–35)
BUN: 9 mg/dL (ref 6–23)
CO2: 29 mEq/L (ref 19–32)
Calcium: 9.4 mg/dL (ref 8.4–10.5)
GFR calc Af Amer: >90 mL/min (ref >90)
GFR calc non Af Amer: >90 mL/min (ref >90)
Glucose, Bld: 115 mg/dL — ABNORMAL HIGH (ref 70–99)
Total Protein: 7.5 g/dL (ref 6.0–8.3)

## 2013-02-17 LAB — SURGICAL PCR SCREEN
MRSA, PCR: NEGATIVE
Staphylococcus aureus: NEGATIVE

## 2013-02-17 NOTE — Pre-Procedure Instructions (Signed)
02-17-13- CT Chest 11'13- Epic. EKG done today.

## 2013-02-17 NOTE — Patient Instructions (Addendum)
20 KATHLEE BARNHARDT  02/17/2013   Your procedure is scheduled on:3-6   -2014  Report to [facility] at      0530  AM .  Call this number if you have problems the morning of surgery: [phone]  Or Presurgical Testing [phone](Ardis Lawley)   Remember: Follow any bowel prep instructions per MD office.   Do not eat food:After Midnight.    Take these medicines the morning of surgery with A SIP OF WATER: Simvastatin. Tylenol.   Do not wear jewelry, make-up or nail polish.  Do not wear lotions, powders, or perfumes. You may wear deodorant.  Do not shave 12 hours prior to first CHG shower(legs and under arms).(face and neck okay.)  Do not bring valuables to the hospital.  Contacts, dentures or bridgework,body piercing,  may not be worn into surgery.  Leave suitcase in the car. After surgery it may be brought to your room.  For patients admitted to the hospital, checkout time is 11:00 AM the day of discharge.   Patients discharged the day of surgery will not be allowed to drive home. Must have responsible person with you x 24 hours once discharged.  Name and phone number of your driver: sister Claiborne Billings- [phone] cell  Special Instructions: CHG(Chlorhedine 4%-"Hibiclens","Betasept","Aplicare") Shower Use Special Wash: see special instructions.(avoid face and genitals)   Please read over the following fact sheets that you were given: MRSA Information, Blood Transfusion fact sheet.    Failure to follow these instructions may result in Cancellation of your surgery.   Patient signature_______________________________________________________

## 2013-02-24 ENCOUNTER — Inpatient Hospital Stay (HOSPITAL_COMMUNITY)
Admission: RE | Admit: 2013-02-24 | Discharge: 2013-03-02 | DRG: 330 | Disposition: A | Discharge status: Home / Self Care | Payer: 59 | Source: Ambulatory Visit | Attending: General Surgery | Admitting: General Surgery

## 2013-02-24 ENCOUNTER — Ambulatory Visit (HOSPITAL_COMMUNITY): Payer: 59 | Admitting: Registered Nurse

## 2013-02-24 ENCOUNTER — Encounter (HOSPITAL_COMMUNITY): Payer: Self-pay | Admitting: Registered Nurse

## 2013-02-24 ENCOUNTER — Encounter (HOSPITAL_COMMUNITY): Payer: Self-pay | Admitting: *Deleted

## 2013-02-24 ENCOUNTER — Encounter (HOSPITAL_COMMUNITY)
Admission: RE | Disposition: A | Discharge status: Home / Self Care | Payer: Self-pay | Source: Ambulatory Visit | Attending: General Surgery

## 2013-02-24 DIAGNOSIS — K567 Ileus, unspecified: Secondary | ICD-10-CM | POA: Diagnosis present

## 2013-02-24 DIAGNOSIS — Y838 Other surgical procedures as the cause of abnormal reaction of the patient, or of later complication, without mention of misadventure at the time of the procedure: Secondary | ICD-10-CM | POA: Diagnosis not present

## 2013-02-24 DIAGNOSIS — K560 Paralytic ileus: Secondary | ICD-10-CM | POA: Diagnosis not present

## 2013-02-24 DIAGNOSIS — D509 Iron deficiency anemia, unspecified: Secondary | ICD-10-CM | POA: Diagnosis present

## 2013-02-24 DIAGNOSIS — I10 Essential (primary) hypertension: Secondary | ICD-10-CM | POA: Diagnosis present

## 2013-02-24 DIAGNOSIS — Z433 Encounter for attention to colostomy: Principal | ICD-10-CM

## 2013-02-24 DIAGNOSIS — K9189 Other postprocedural complications and disorders of digestive system: Secondary | ICD-10-CM | POA: Diagnosis present

## 2013-02-24 DIAGNOSIS — Z933 Colostomy status: Secondary | ICD-10-CM

## 2013-02-24 DIAGNOSIS — Z79899 Other long term (current) drug therapy: Secondary | ICD-10-CM

## 2013-02-24 DIAGNOSIS — K660 Peritoneal adhesions (postprocedural) (postinfection): Secondary | ICD-10-CM | POA: Diagnosis present

## 2013-02-24 DIAGNOSIS — Z86711 Personal history of pulmonary embolism: Secondary | ICD-10-CM

## 2013-02-24 DIAGNOSIS — C186 Malignant neoplasm of descending colon: Secondary | ICD-10-CM | POA: Diagnosis present

## 2013-02-24 DIAGNOSIS — K929 Disease of digestive system, unspecified: Secondary | ICD-10-CM | POA: Diagnosis not present

## 2013-02-24 DIAGNOSIS — Z9049 Acquired absence of other specified parts of digestive tract: Secondary | ICD-10-CM

## 2013-02-24 DIAGNOSIS — Z85038 Personal history of other malignant neoplasm of large intestine: Secondary | ICD-10-CM

## 2013-02-24 DIAGNOSIS — D696 Thrombocytopenia, unspecified: Secondary | ICD-10-CM | POA: Diagnosis not present

## 2013-02-24 DIAGNOSIS — E785 Hyperlipidemia, unspecified: Secondary | ICD-10-CM | POA: Diagnosis present

## 2013-02-24 DIAGNOSIS — Z86718 Personal history of other venous thrombosis and embolism: Secondary | ICD-10-CM

## 2013-02-24 HISTORY — PX: COLOSTOMY TAKEDOWN: SHX5258

## 2013-02-24 LAB — TYPE AND SCREEN
ABO/RH(D): B POS
Antibody Screen: NEGATIVE

## 2013-02-24 SURGERY — CLOSURE, COLOSTOMY, LAPAROSCOPIC
Anesthesia: General | Site: Abdomen | Wound class: Clean Contaminated

## 2013-02-24 MED ORDER — PROPOFOL 10 MG/ML IV BOLUS
INTRAVENOUS | Status: DC | PRN
  Administered 2013-02-24: 200 mg via INTRAVENOUS
  Administered 2013-02-24 (×3): 30 mg via INTRAVENOUS

## 2013-02-24 MED ORDER — LABETALOL HCL 5 MG/ML IV SOLN
INTRAVENOUS | Status: DC | PRN
  Administered 2013-02-24: 5 mg via INTRAVENOUS

## 2013-02-24 MED ORDER — ACETAMINOPHEN 10 MG/ML IV SOLN
INTRAVENOUS | Status: AC
  Filled 2013-02-24: qty 100

## 2013-02-24 MED ORDER — KCL-LACTATED RINGERS-D5W 20 MEQ/L IV SOLN
INTRAVENOUS | Status: DC
  Administered 2013-02-24 – 2013-02-26 (×8): via INTRAVENOUS
  Filled 2013-02-24 (×12): qty 1000

## 2013-02-24 MED ORDER — NALOXONE HCL 0.4 MG/ML IJ SOLN: 0.4000 mg | INTRAMUSCULAR | Status: DC | PRN

## 2013-02-24 MED ORDER — MIDAZOLAM HCL 5 MG/5ML IJ SOLN
INTRAMUSCULAR | Status: DC | PRN
  Administered 2013-02-24 (×2): 2 mg via INTRAVENOUS

## 2013-02-24 MED ORDER — ONDANSETRON HCL 4 MG/2ML IJ SOLN: INTRAMUSCULAR | Status: DC | PRN

## 2013-02-24 MED ORDER — HYDROMORPHONE HCL PF 1 MG/ML IJ SOLN: 0.2500 mg | INTRAMUSCULAR | Status: DC | PRN

## 2013-02-24 MED ORDER — LACTATED RINGERS IR SOLN
Status: DC | PRN
  Administered 2013-02-24: 1000 mL

## 2013-02-24 MED ORDER — LACTATED RINGERS IV SOLN
INTRAVENOUS | Status: DC | PRN
  Administered 2013-02-24 (×5): via INTRAVENOUS

## 2013-02-24 MED ORDER — MORPHINE SULFATE (PF) 1 MG/ML IV SOLN
INTRAVENOUS | Status: AC
  Filled 2013-02-24: qty 25

## 2013-02-24 MED ORDER — SUCCINYLCHOLINE CHLORIDE 20 MG/ML IJ SOLN
INTRAMUSCULAR | Status: DC | PRN
  Administered 2013-02-24: 100 mg via INTRAVENOUS

## 2013-02-24 MED ORDER — HEPARIN SODIUM (PORCINE) 5000 UNIT/ML IJ SOLN
5000.0000 [IU] | Freq: Three times a day (TID) | INTRAMUSCULAR | Status: DC
  Administered 2013-02-25 – 2013-03-02 (×15): 5000 [IU] via SUBCUTANEOUS
  Filled 2013-02-24 (×19): qty 1

## 2013-02-24 MED ORDER — BUPIVACAINE HCL (PF) 0.5 % IJ SOLN
INTRAMUSCULAR | Status: DC | PRN
  Administered 2013-02-24: 10 mL

## 2013-02-24 MED ORDER — ROCURONIUM BROMIDE 100 MG/10ML IV SOLN
INTRAVENOUS | Status: DC | PRN
  Administered 2013-02-24: 10 mg via INTRAVENOUS
  Administered 2013-02-24: 50 mg via INTRAVENOUS
  Administered 2013-02-24 (×4): 10 mg via INTRAVENOUS
  Administered 2013-02-24: 20 mg via INTRAVENOUS

## 2013-02-24 MED ORDER — MORPHINE SULFATE (PF) 1 MG/ML IV SOLN
INTRAVENOUS | Status: DC
  Administered 2013-02-24: 9 mg via INTRAVENOUS
  Administered 2013-02-24: 13:00:00 via INTRAVENOUS
  Administered 2013-02-24 – 2013-02-25 (×2): 6 mg via INTRAVENOUS
  Administered 2013-02-25: 3 mg via INTRAVENOUS
  Administered 2013-02-25: 01:00:00 via INTRAVENOUS
  Administered 2013-02-25 (×2): 7.5 mg via INTRAVENOUS
  Administered 2013-02-25: 6 mg via INTRAVENOUS
  Administered 2013-02-25 – 2013-02-26 (×3): 4.5 mg via INTRAVENOUS
  Administered 2013-02-26: 09:00:00 via INTRAVENOUS
  Filled 2013-02-24 (×3): qty 25

## 2013-02-24 MED ORDER — LIDOCAINE HCL 4 % MT SOLN
OROMUCOSAL | Status: DC | PRN
  Administered 2013-02-24: 4 mL via TOPICAL

## 2013-02-24 MED ORDER — SUFENTANIL CITRATE 50 MCG/ML IV SOLN
INTRAVENOUS | Status: DC | PRN
  Administered 2013-02-24: 5 ug via INTRAVENOUS
  Administered 2013-02-24: 10 ug via INTRAVENOUS
  Administered 2013-02-24: 15 ug via INTRAVENOUS
  Administered 2013-02-24 (×2): 10 ug via INTRAVENOUS
  Administered 2013-02-24: 15 ug via INTRAVENOUS
  Administered 2013-02-24 (×3): 10 ug via INTRAVENOUS
  Administered 2013-02-24: 15 ug via INTRAVENOUS

## 2013-02-24 MED ORDER — DEXTROSE 5 % IV SOLN
2.0000 g | INTRAVENOUS | Status: AC
  Administered 2013-02-24: 2 g via INTRAVENOUS
  Filled 2013-02-24: qty 2

## 2013-02-24 MED ORDER — CEFOXITIN SODIUM-DEXTROSE 1-4 GM-% IV SOLR (PREMIX)
INTRAVENOUS | Status: AC
  Filled 2013-02-24: qty 50

## 2013-02-24 MED ORDER — ALVIMOPAN 12 MG PO CAPS
12.0000 mg | ORAL_CAPSULE | Freq: Two times a day (BID) | ORAL | Status: DC
  Administered 2013-02-25 – 2013-02-28 (×7): 12 mg via ORAL
  Filled 2013-02-24 (×8): qty 1

## 2013-02-24 MED ORDER — DIPHENHYDRAMINE HCL 12.5 MG/5ML PO ELIX: 12.5000 mg | ORAL_SOLUTION | Freq: Four times a day (QID) | ORAL | Status: DC | PRN

## 2013-02-24 MED ORDER — CEFOXITIN SODIUM-DEXTROSE 1-4 GM-% IV SOLR (PREMIX)
INTRAVENOUS | Status: AC
  Filled 2013-02-24: qty 100

## 2013-02-24 MED ORDER — HYDROMORPHONE HCL PF 1 MG/ML IJ SOLN
INTRAMUSCULAR | Status: DC | PRN
  Administered 2013-02-24 (×2): 1 mg via INTRAVENOUS

## 2013-02-24 MED ORDER — DEXAMETHASONE SODIUM PHOSPHATE 10 MG/ML IJ SOLN
INTRAMUSCULAR | Status: DC | PRN
  Administered 2013-02-24: 10 mg via INTRAVENOUS

## 2013-02-24 MED ORDER — LIDOCAINE HCL (CARDIAC) 20 MG/ML IV SOLN
INTRAVENOUS | Status: DC | PRN
  Administered 2013-02-24: 80 mg via INTRAVENOUS

## 2013-02-24 MED ORDER — ALVIMOPAN 12 MG PO CAPS
12.0000 mg | ORAL_CAPSULE | Freq: Once | ORAL | Status: AC
  Administered 2013-02-24: 12 mg via ORAL
  Filled 2013-02-24: qty 1

## 2013-02-24 MED ORDER — ONDANSETRON HCL 4 MG PO TABS
4.0000 mg | ORAL_TABLET | Freq: Four times a day (QID) | ORAL | Status: DC | PRN
  Administered 2013-03-01: 4 mg via ORAL
  Filled 2013-02-24: qty 1

## 2013-02-24 MED ORDER — LACTATED RINGERS IV SOLN: INTRAVENOUS | Status: DC

## 2013-02-24 MED ORDER — DIPHENHYDRAMINE HCL 50 MG/ML IJ SOLN: 12.5000 mg | Freq: Four times a day (QID) | INTRAMUSCULAR | Status: DC | PRN

## 2013-02-24 MED ORDER — KCL IN DEXTROSE-NACL 20-5-0.45 MEQ/L-%-% IV SOLN
INTRAVENOUS | Status: AC
  Filled 2013-02-24: qty 1000

## 2013-02-24 MED ORDER — SODIUM CHLORIDE 0.9 % IJ SOLN: 9.0000 mL | INTRAMUSCULAR | Status: DC | PRN

## 2013-02-24 MED ORDER — ONDANSETRON HCL 4 MG/2ML IJ SOLN
4.0000 mg | INTRAMUSCULAR | Status: DC | PRN
  Filled 2013-02-24: qty 2

## 2013-02-24 MED ORDER — PANTOPRAZOLE SODIUM 40 MG IV SOLR
40.0000 mg | Freq: Every day | INTRAVENOUS | Status: DC
  Administered 2013-02-24 – 2013-02-25 (×2): 40 mg via INTRAVENOUS
  Filled 2013-02-24 (×4): qty 40

## 2013-02-24 MED ORDER — ACETAMINOPHEN 10 MG/ML IV SOLN
INTRAVENOUS | Status: DC | PRN
  Administered 2013-02-24: 1000 mg via INTRAVENOUS

## 2013-02-24 MED ORDER — DEXTROSE 5 % IV SOLN
2.0000 g | Freq: Two times a day (BID) | INTRAVENOUS | Status: AC
  Administered 2013-02-24: 2 g via INTRAVENOUS
  Filled 2013-02-24: qty 2

## 2013-02-24 MED ORDER — ONDANSETRON HCL 4 MG/2ML IJ SOLN: 4.0000 mg | Freq: Four times a day (QID) | INTRAMUSCULAR | Status: DC | PRN

## 2013-02-24 MED ORDER — BUPIVACAINE HCL (PF) 0.5 % IJ SOLN
INTRAMUSCULAR | Status: AC
  Filled 2013-02-24: qty 30

## 2013-02-24 MED ORDER — ONDANSETRON HCL 4 MG/2ML IJ SOLN
INTRAMUSCULAR | Status: DC | PRN
  Administered 2013-02-24: 4 mg via INTRAVENOUS

## 2013-02-24 MED ORDER — 0.9 % SODIUM CHLORIDE (POUR BTL) OPTIME
TOPICAL | Status: DC | PRN
  Administered 2013-02-24: 3000 mL

## 2013-02-24 MED ORDER — ACETAMINOPHEN 10 MG/ML IV SOLN: INTRAVENOUS | Status: DC | PRN

## 2013-02-24 MED ORDER — PHENYLEPHRINE HCL 10 MG/ML IJ SOLN
INTRAMUSCULAR | Status: DC | PRN
  Administered 2013-02-24: 80 ug via INTRAVENOUS

## 2013-02-24 SURGICAL SUPPLY — 79 items
APPLIER CLIP 5 13 M/L LIGAMAX5 (MISCELLANEOUS)
APPLIER CLIP ROT 10 11.4 M/L (STAPLE)
APR CLP MED LRG 11.4X10 (STAPLE)
APR CLP MED LRG 5 ANG JAW (MISCELLANEOUS)
BLADE EXTENDED COATED 6.5IN (ELECTRODE) IMPLANT
BLADE HEX COATED 2.75 (ELECTRODE) ×2 IMPLANT
BLADE SURG SZ10 CARB STEEL (BLADE) ×2 IMPLANT
CABLE HIGH FREQUENCY MONO STRZ (ELECTRODE) ×2 IMPLANT
CANISTER SUCTION 2500CC (MISCELLANEOUS) ×2 IMPLANT
CANNULA ENDOPATH XCEL 11M (ENDOMECHANICALS) IMPLANT
CELLS DAT CNTRL 66122 CELL SVR (MISCELLANEOUS) IMPLANT
CLIP APPLIE 5 13 M/L LIGAMAX5 (MISCELLANEOUS) IMPLANT
CLIP APPLIE ROT 10 11.4 M/L (STAPLE) IMPLANT
CLOTH BEACON ORANGE TIMEOUT ST (SAFETY) ×2 IMPLANT
COVER MAYO STAND STRL (DRAPES) ×2 IMPLANT
DECANTER SPIKE VIAL GLASS SM (MISCELLANEOUS) ×1 IMPLANT
DISSECTOR BLUNT TIP ENDO 5MM (MISCELLANEOUS) ×1 IMPLANT
DRAPE LAPAROSCOPIC ABDOMINAL (DRAPES) ×2 IMPLANT
DRAPE LG THREE QUARTER DISP (DRAPES) ×1 IMPLANT
DRAPE WARM FLUID 44X44 (DRAPE) ×2 IMPLANT
DRSG PAD ABDOMINAL 8X10 ST (GAUZE/BANDAGES/DRESSINGS) ×1 IMPLANT
ELECT REM PT RETURN 9FT ADLT (ELECTROSURGICAL) ×2
ELECTRODE REM PT RTRN 9FT ADLT (ELECTROSURGICAL) ×1 IMPLANT
EVACUATOR SILICONE 100CC (DRAIN) ×1 IMPLANT
FILTER SMOKE EVAC LAPAROSHD (FILTER) IMPLANT
GLOVE BIOGEL PI IND STRL 7.0 (GLOVE) ×1 IMPLANT
GLOVE BIOGEL PI INDICATOR 7.0 (GLOVE) ×1
GLOVE ECLIPSE 6.5 STRL STRAW (GLOVE) ×1 IMPLANT
GLOVE ECLIPSE 8.0 STRL XLNG CF (GLOVE) ×4 IMPLANT
GLOVE INDICATOR 8.0 STRL GRN (GLOVE) ×4 IMPLANT
GOWN STRL NON-REIN LRG LVL3 (GOWN DISPOSABLE) ×2 IMPLANT
GOWN STRL REIN XL XLG (GOWN DISPOSABLE) ×5 IMPLANT
KIT BASIN OR (CUSTOM PROCEDURE TRAY) ×2 IMPLANT
LEGGING LITHOTOMY PAIR STRL (DRAPES) ×1 IMPLANT
LIGASURE IMPACT 36 18CM CVD LR (INSTRUMENTS) IMPLANT
NS IRRIG 1000ML POUR BTL (IV SOLUTION) ×4 IMPLANT
PENCIL BUTTON HOLSTER BLD 10FT (ELECTRODE) ×2 IMPLANT
RELOAD PROXIMATE 75MM BLUE (ENDOMECHANICALS) ×6 IMPLANT
RELOAD STAPLE 75 3.8 BLU REG (ENDOMECHANICALS) IMPLANT
RETRACTOR WND ALEXIS 18 MED (MISCELLANEOUS) IMPLANT
RTRCTR WOUND ALEXIS 18CM MED (MISCELLANEOUS)
SCALPEL HARMONIC ACE (MISCELLANEOUS) IMPLANT
SCISSORS LAP 5X35 DISP (ENDOMECHANICALS) ×2 IMPLANT
SET IRRIG TUBING LAPAROSCOPIC (IRRIGATION / IRRIGATOR) ×1 IMPLANT
SLEEVE XCEL OPT CAN 5 100 (ENDOMECHANICALS) IMPLANT
SOLUTION ANTI FOG 6CC (MISCELLANEOUS) ×2 IMPLANT
SPONGE GAUZE 4X4 12PLY (GAUZE/BANDAGES/DRESSINGS) ×2 IMPLANT
SPONGE LAP 18X18 X RAY DECT (DISPOSABLE) ×6 IMPLANT
STAPLER PROXIMATE 75MM BLUE (STAPLE) ×1 IMPLANT
STAPLER VISISTAT 35W (STAPLE) ×2 IMPLANT
SUCTION POOLE TIP (SUCTIONS) ×2 IMPLANT
SUT ETHILON 3 0 PS 1 (SUTURE) ×1 IMPLANT
SUT PDS AB 1 CTX 36 (SUTURE) ×2 IMPLANT
SUT PDS AB 1 TP1 96 (SUTURE) ×2 IMPLANT
SUT PROLENE 2 0 SH DA (SUTURE) IMPLANT
SUT SILK 2 0 (SUTURE)
SUT SILK 2 0 SH CR/8 (SUTURE) ×1 IMPLANT
SUT SILK 2-0 18XBRD TIE 12 (SUTURE) ×1 IMPLANT
SUT SILK 3 0 (SUTURE) ×2
SUT SILK 3 0 SH CR/8 (SUTURE) ×3 IMPLANT
SUT SILK 3-0 18XBRD TIE 12 (SUTURE) ×1 IMPLANT
SUT VIC AB 3-0 SH 27 (SUTURE) ×4
SUT VIC AB 3-0 SH 27X BRD (SUTURE) IMPLANT
SUT VICRYL 2 0 18  UND BR (SUTURE) ×1
SUT VICRYL 2 0 18 UND BR (SUTURE) ×1 IMPLANT
SYS LAPSCP GELPORT 120MM (MISCELLANEOUS)
SYSTEM LAPSCP GELPORT 120MM (MISCELLANEOUS) IMPLANT
TAPE CLOTH SURG 4X10 WHT LF (GAUZE/BANDAGES/DRESSINGS) ×1 IMPLANT
TOWEL OR 17X26 10 PK STRL BLUE (TOWEL DISPOSABLE) ×4 IMPLANT
TRAY FOLEY CATH 14FRSI W/METER (CATHETERS) ×2 IMPLANT
TRAY LAP CHOLE (CUSTOM PROCEDURE TRAY) ×2 IMPLANT
TROCAR BLADELESS OPT 5 100 (ENDOMECHANICALS) IMPLANT
TROCAR BLADELESS OPT 5 75 (ENDOMECHANICALS) ×5 IMPLANT
TROCAR SLEEVE XCEL 5X75 (ENDOMECHANICALS) ×2 IMPLANT
TROCAR XCEL BLUNT TIP 100MML (ENDOMECHANICALS) IMPLANT
TROCAR XCEL NON-BLD 11X100MML (ENDOMECHANICALS) IMPLANT
TUBING INSUFFLATION 10FT LAP (TUBING) ×2 IMPLANT
YANKAUER SUCT BULB TIP 10FT TU (MISCELLANEOUS) ×2 IMPLANT
YANKAUER SUCT BULB TIP NO VENT (SUCTIONS) ×2 IMPLANT

## 2013-02-24 NOTE — Transfer of Care (Signed)
Immediate Anesthesia Transfer of Care Note  Patient: Mallory Morales  Procedure(s) Performed: Procedure(s) with comments: Laparoscopic Assisted Colostomy Closure (N/A) - Laparoscopic Assisted Colostomy Closure  Patient Location: PACU  Anesthesia Type:General  Level of Consciousness: awake, alert , oriented and patient cooperative  Airway & Oxygen Therapy: Patient Spontanous Breathing and Patient connected to face mask oxygen  Post-op Assessment: Report given to PACU RN, Post -op Vital signs reviewed and stable and Patient moving all extremities X 4  Post vital signs: stable  Complications: No apparent anesthesia complications

## 2013-02-24 NOTE — Anesthesia Postprocedure Evaluation (Signed)
  Anesthesia Post-op Note  Patient: Mallory Morales  Procedure(s) Performed: Procedure(s) (LRB): Laparoscopic Assisted Colostomy Closure (N/A)  Patient Location: PACU  Anesthesia Type: General  Level of Consciousness: awake and alert   Airway and Oxygen Therapy: Patient Spontanous Breathing  Post-op Pain: mild  Post-op Assessment: Post-op Vital signs reviewed, Patient's Cardiovascular Status Stable, Respiratory Function Stable, Patent Airway and No signs of Nausea or vomiting  Last Vitals:  Filed Vitals:   02/24/13 1215  BP: 153/90  Pulse: 91  Temp: 37.1 C  Resp: 6    Post-op Vital Signs: stable   Complications: No apparent anesthesia complications

## 2013-02-24 NOTE — H&P (Signed)
Mallory Morales is an 57 y.o. female.   Chief Complaint:   Here for colostomy closure HPI:   Mallory Morales is here for colostomy closure s/p left colectomy and colostomy for obstructing colon cancer. Recent CT scan did not demonstrate any evidence of metastatic colon cancer.  Here husband recently passed away.   Past Medical History  Diagnosis Date  . Hypertension   . Hyperlipemia   . Anemia, iron deficiency 01/27/2012  . Clotting disorder   . Pulmonary embolism     taking injections -last done 1'14(Arixtra)  . DVT (deep venous thrombosis)   . Complication of anesthesia     "hard time getting breath" post op  . Colon cancer     emergency surgery/with colostomy done-no further tx.    Past Surgical History  Procedure Laterality Date  . Abdominal hysterectomy    . Hernia repair    . Colon resection  01/14/2012    Procedure: COLON RESECTION;  Surgeon: Adolph Pollack, MD;  Location: [facility] OR;  Service: General;  Laterality: N/A;  left colectomy, mobilization of splenic flexure, ostomy.  . Colostomy    . Colon surgery      Family History  Problem Relation Age of Onset  . Heart attack Father   . Hypertension Father   . Heart disease Father   . Stomach cancer Sister   . Pancreatic cancer Brother   . Cancer Brother     pancreatic   Social History:  reports that she quit smoking about 27 years ago. She has never used smokeless tobacco. She reports that she drinks about 0.6 ounces of alcohol per week. She reports that she does not use illicit drugs.  Allergies:  Allergies  Allergen Reactions  . Strawberry Swelling    Face only. Breathing not affected.    Medications Prior to Admission  Medication Sig Dispense Refill  . acetaminophen (TYLENOL) 500 MG tablet Take 500 mg by mouth every 6 (six) hours as needed for pain. For pain      . hydrochlorothiazide (HYDRODIURIL) 25 MG tablet Take 25 mg by mouth every morning.      . simvastatin (ZOCOR) 40 MG tablet Take 40 mg by mouth every  morning.        No results found for this or any previous visit (from the past 48 hour(s)). No results found.  Review of Systems  Constitutional: Negative for fever and chills.  Gastrointestinal: Negative for nausea, vomiting and abdominal pain.    Blood pressure 126/83, pulse 105, temperature 97.8 F (36.6 C), temperature source Oral, resp. rate 18, SpO2 98.00%. Physical Exam  Constitutional: She appears well-developed and well-nourished. No distress.  HENT:  Head: Normocephalic and atraumatic.  Cardiovascular: Normal rate and regular rhythm.   Respiratory: Effort normal and breath sounds normal.  GI: Soft. There is no tenderness.  Midline scar.  Colostomy on left side.  Musculoskeletal: She exhibits no edema.  Neurological: She is alert.  Skin: Skin is warm and dry.     Assessment/Plan Left colostomy s/p left colectomy and colostomy for colon cancer.  Currently disease free.  Plan:  Laparoscopic possible open colostomy closure.  ROSENBOWER,TODD J 02/24/2013, 7:34 AM

## 2013-02-24 NOTE — Anesthesia Preprocedure Evaluation (Addendum)
Anesthesia Evaluation  Patient identified by MRN, date of birth, ID band Patient awake    Reviewed: Allergy & Precautions, H&P , NPO status , Patient's Chart, lab work & pertinent test results  Airway Mallampati: II TM Distance: >3 FB Neck ROM: full    Dental no notable dental hx. (+) Teeth Intact and Dental Advisory Given   Pulmonary neg pulmonary ROS,  Hx. PE breath sounds clear to auscultation  Pulmonary exam normal       Cardiovascular Exercise Tolerance: Good hypertension, Pt. on medications Rhythm:regular Rate:Normal     Neuro/Psych negative neurological ROS  negative psych ROS   GI/Hepatic negative GI ROS, Neg liver ROS,   Endo/Other  negative endocrine ROS  Renal/GU negative Renal ROS  negative genitourinary   Musculoskeletal   Abdominal   Peds  Hematology negative hematology ROS (+)   Anesthesia Other Findings Hard time getting breath after surgery  Reproductive/Obstetrics negative OB ROS                         Anesthesia Physical Anesthesia Plan  ASA: III  Anesthesia Plan: General   Post-op Pain Management:    Induction: Intravenous  Airway Management Planned: Oral ETT  Additional Equipment:   Intra-op Plan:   Post-operative Plan: Extubation in OR  Informed Consent: I have reviewed the patients History and Physical, chart, labs and discussed the procedure including the risks, benefits and alternatives for the proposed anesthesia with the patient or authorized representative who has indicated his/her understanding and acceptance.   Dental Advisory Given  Plan Discussed with: CRNA and Surgeon  Anesthesia Plan Comments:         Anesthesia Quick Evaluation

## 2013-02-24 NOTE — Op Note (Signed)
Operative Note  Mallory Morales female 57 y.o. 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  Anesthesia: General endotracheal anesthesia  Indications:   This is a 57 year old female who underwent emergency left colectomy with colostomy for an obstructing colon cancer in January of 2013. She has completely recovered from this. She is disease free. She now presents for the colostomy closure.    Procedure Detail:  She was seen in the holding area. She was brought to the operating room placed supine on the operating table and general anesthetic was given. She was placed in the lithotomy position. A Foley catheter was inserted. The left-sided colostomy appliance was removed and the area around the colostomy was cleaned. The abdominal wall and perineal areas were sterilely prepped and draped.  Using a 5 mm Optiview trocar the laparoscope by gaining access into the right upper quadrant. Adhesions were noted between the omentum and anterior abdominal wall and I used the laparoscope to do some gentle blunt dissection until I was able to see into the right lower quadrant. Another 5 mm trocar was placed in the right lower quadrant. Sharp lysis of adhesions was performed between the omentum and abdominal wall as well as on the small bowel and abdominal wall beginning in the lower abdomen and extending all the way up to the epigastric region and left upper quadrant.  A 5 mm trocar was then placed in the left upper quadrant. I then identified the suture holding the sigmoid colon pouch to the lateral abdominal wall. I began the free up the sigmoid colon by dividing adhesions close to the colon between it and the lateral abdominal wall. I then mobilized  it free from the abdominal wall by freeing it up from its attachment to the abdominal wall. This lysis of adhesions took approximately 2 hours.  Next, approach the left-sided colostomy. An elliptical incision was made in the skin around the colostomy. Using electrocautery the subcutaneous tissue was divided. Using sharp dissection and electrocautery I then dissected the colostomy free from the surrounding fascia up. I significant amount of length. I then used a GIA stapler to do a segmental left colon resection to include the colostomy. I dropped this back into the abdominal cavity.  I then made a limited lower midline incision through the previous scar. I carried this down until I entered the peritoneal cavity. I inspected the proximal colon and the sigmoid colon pouch area.   I further mobilized the sigmoid colon stump by dividing some lateral adhesions.    I noticed a small area of serosal tear on the sigmoid colon stopped with a little bit of a cautery burn. Was able to place the 2 ends of the colon in a side to side fashion. A colotomy was made in the sigmoid colon stump through the area of the serosal tear. A colotomy was made in the proximal colon as well. A side-to-side staple anastomosis was then performed with the GIA 75 mm stapler. Redundant segments of the descending colon limb and the sigmoid colon limb were then resected with the GIA stapler. The common defect was closed in 2 layers using running locking 0 Vicryl suture followed by 3-0 silk sutures in a Lembert-type fashion. The anastomosis was patent,  viable, and under no tension. A crotch stitch of 3-0 silk was placed. The anastomosis was placed under water, the bowel occluded proximal to it. Air was insufflated into the rectum and there was no obvious leak.  The 5 mm right lower quadrant trocar was removed and a size 19 Blake drain placed through that incision in position in the pelvis. It was anchored to the skin with a 3-0 nylon suture.The  abdominal cavity copiously irrigated with saline solution and there is no evidence of bowel leak or bleeding.  The colostomy site fascia was then closed with a running #1 PDS suture. A limited lower midline incision was closed with a #1 PDS double loop suture. I perform laparoscopy again and noticed no evidence of bleeding or intestinal injury after a four-quadrant inspection. The remaining trocars were removed.  The colostomy site was packed with saline moistened gauze. The lower midline skin incision was closed with staples. The trocar site incisions were closed with staples. Sterile dressings were applied. The drain was hooked to closed bulb suction.  She tolerated the procedures well without any apparent complications and was taken to the recovery room in satisfactory condition.  Estimated Blood Loss:  450 ml         Drains: JACKSON-PRATT (JP)  Blood Given: none          Specimens: Segment of descending colon colostomy. Segments of sigmoid colon.        Complications:  * No complications entered in OR log *         Disposition: PACU - hemodynamically stable.         Condition: stable

## 2013-02-24 NOTE — Progress Notes (Signed)
Utilization review completed.  

## 2013-02-25 LAB — BASIC METABOLIC PANEL
CO2: 29 mEq/L (ref 19–32)
Calcium: 8.6 mg/dL (ref 8.4–10.5)
Creatinine, Ser: 0.72 mg/dL (ref 0.50–1.10)
Glucose, Bld: 180 mg/dL — ABNORMAL HIGH (ref 70–99)

## 2013-02-25 LAB — CBC
MCH: 31.7 pg (ref 26.0–34.0)
MCV: 87.8 fL (ref 78.0–100.0)
Platelets: 142 10*3/uL — ABNORMAL LOW (ref 150–400)
RDW: 12.4 % (ref 11.5–15.5)
WBC: 10.5 10*3/uL (ref 4.0–10.5)

## 2013-02-25 NOTE — Progress Notes (Signed)
1 Day Post-Op  Subjective: Comfortable.  Has walked.  No nausea.  Objective: Vital signs in last 24 hours: Temp:  [98 F (36.7 C)-98.7 F (37.1 C)] 98.1 F (36.7 C) (03/07 0538) Pulse Rate:  [68-102] 68 (03/07 0538) Resp:  [6-16] 10 (03/07 0538) BP: (120-160)/(74-99) 123/74 mmHg (03/07 0538) SpO2:  [100 %] 100 % (03/07 0538) Weight:  [163 lb 6 oz (74.106 kg)] 163 lb 6 oz (74.106 kg) (03/06 1334) Last BM Date: 02/23/13  Intake/Output from previous day: 03/06 0701 - 03/07 0700 In: 7114.6 [I.V.:7114.6] Out: 2720 [Urine:2040; Drains:230; Blood:450] Intake/Output this shift:    PE: General- In NAD Abdomen-soft, serosanguinous drain output, dressings with some dried drainage  Lab Results:   Recent Labs  02/25/13 0355  WBC 10.5  HGB 12.2  HCT 33.8*  PLT 142*   BMET  Recent Labs  02/25/13 0355  NA 136  K 4.1  CL 101  CO2 29  GLUCOSE 180*  BUN 7  CREATININE 0.72  CALCIUM 8.6   PT/INR No results found for this basename: LABPROT, INR,  in the last 72 hours Comprehensive Metabolic Panel:    Component Value Date/Time   NA 136 02/25/2013 0355   K 4.1 02/25/2013 0355   CL 101 02/25/2013 0355   CO2 29 02/25/2013 0355   BUN 7 02/25/2013 0355   CREATININE 0.72 02/25/2013 0355   GLUCOSE 180* 02/25/2013 0355   CALCIUM 8.6 02/25/2013 0355   AST 15 02/17/2013 1000   ALT 12 02/17/2013 1000   ALKPHOS 60 02/17/2013 1000   BILITOT 1.0 02/17/2013 1000   PROT 7.5 02/17/2013 1000   ALBUMIN 4.2 02/17/2013 1000     Studies/Results: No results found.  Anti-infectives: Anti-infectives   Start     Dose/Rate Route Frequency Ordered Stop   02/24/13 2000  cefoTEtan (CEFOTAN) 2 g in dextrose 5 % 50 mL IVPB     2 g 100 mL/hr over 30 Minutes Intravenous Every 12 hours 02/24/13 1346 02/24/13 2100   02/24/13 0534  cefOXitin (MEFOXIN) 2 g in dextrose 5 % 50 mL IVPB     2 g 100 mL/hr over 30 Minutes Intravenous On call to O.R. 02/24/13 0534 02/24/13 0742      Assessment Principal Problem:  Colostomy in place s/p closure 02/24/13-stable overnight Active Problems:   Hypertension-BP within normal limits  Mild thrombocytopenia    LOS: 1 day   Plan: Clear liquids.  Recheck cbc tomorrow.   Mallory Morales J 02/25/2013

## 2013-02-26 LAB — CBC
HCT: 34.5 % — ABNORMAL LOW (ref 36.0–46.0)
Hemoglobin: 11.8 g/dL — ABNORMAL LOW (ref 12.0–15.0)
MCH: 31.1 pg (ref 26.0–34.0)
MCHC: 34.2 g/dL (ref 30.0–36.0)
MCV: 90.8 fL (ref 78.0–100.0)
Platelets: 125 10*3/uL — ABNORMAL LOW (ref 150–400)
RBC: 3.8 MIL/uL — ABNORMAL LOW (ref 3.87–5.11)
RDW: 12.4 % (ref 11.5–15.5)
WBC: 7.6 10*3/uL (ref 4.0–10.5)

## 2013-02-26 MED ORDER — PROMETHAZINE HCL 25 MG/ML IJ SOLN: 12.5000 mg | Freq: Four times a day (QID) | INTRAMUSCULAR | Status: DC | PRN

## 2013-02-26 MED ORDER — MAGIC MOUTHWASH
15.0000 mL | Freq: Four times a day (QID) | ORAL | Status: DC | PRN
  Filled 2013-02-26: qty 15

## 2013-02-26 MED ORDER — NAPROXEN 500 MG PO TABS
500.0000 mg | ORAL_TABLET | Freq: Two times a day (BID) | ORAL | Status: DC
  Administered 2013-02-26: 500 mg via ORAL
  Filled 2013-02-26 (×4): qty 1

## 2013-02-26 MED ORDER — SIMVASTATIN 40 MG PO TABS
40.0000 mg | ORAL_TABLET | Freq: Every morning | ORAL | Status: DC
  Administered 2013-02-26: 40 mg via ORAL
  Filled 2013-02-26 (×2): qty 1

## 2013-02-26 MED ORDER — MORPHINE SULFATE 2 MG/ML IJ SOLN: 2.0000 mg | INTRAMUSCULAR | Status: DC | PRN

## 2013-02-26 MED ORDER — ALUM & MAG HYDROXIDE-SIMETH 200-200-20 MG/5ML PO SUSP
30.0000 mL | Freq: Four times a day (QID) | ORAL | Status: DC | PRN
Start: 2013-02-26 — End: 2013-03-02
  Administered 2013-03-01: 30 mL via ORAL
  Filled 2013-02-26 (×2): qty 30

## 2013-02-26 MED ORDER — SACCHAROMYCES BOULARDII 250 MG PO CAPS
250.0000 mg | ORAL_CAPSULE | Freq: Two times a day (BID) | ORAL | Status: DC
  Administered 2013-02-26 – 2013-03-01 (×8): 250 mg via ORAL
  Filled 2013-02-26 (×10): qty 1

## 2013-02-26 MED ORDER — OXYCODONE-ACETAMINOPHEN 5-325 MG PO TABS
1.0000 | ORAL_TABLET | ORAL | Status: DC | PRN
  Administered 2013-02-26: 2 via ORAL
  Filled 2013-02-26: qty 2

## 2013-02-26 MED ORDER — LIP MEDEX EX OINT
1.0000 "application " | TOPICAL_OINTMENT | Freq: Two times a day (BID) | CUTANEOUS | Status: DC
  Administered 2013-03-01: 1 via TOPICAL

## 2013-02-26 MED ORDER — ACETAMINOPHEN 500 MG PO TABS: 500.0000 mg | ORAL_TABLET | Freq: Four times a day (QID) | ORAL | Status: DC | PRN

## 2013-02-26 NOTE — Progress Notes (Signed)
Mallory Morales [id number] [birth year]   Subjective:  Sore IV fell out - getting replaced Hungry  Objective:  Vital signs:  Filed Vitals:   02/25/13 2109 02/25/13 2359 02/26/13 0352 02/26/13 0631  BP: 137/86   133/71  Pulse: 80     Temp: 98 F (36.7 C)   99.4 F (37.4 C)  TempSrc:    Oral  Resp: 15 10 12 14   Height:      Weight:      SpO2: 100% 100% 100% 99%    Last BM Date: 02/24/13  Intake/Output   Yesterday:  03/07 0701 - 03/08 0700 In: 4680.8 [P.O.:1660; I.V.:3020.8] Out: 3940 [Urine:3850; Drains:90] This shift:  Total I/O In: 120 [P.O.:120] Out: 210 [Urine:200; Drains:10]  Bowel function:  Flatus: n  BM: n  Physical Exam:  General: Pt awake/alert/oriented x4 in no acute distress Eyes: PERRL, normal EOM.  Sclera clear.  No icterus Neuro: CN II-XII intact w/o focal sensory/motor deficits. Lymph: No head/neck/groin lymphadenopathy Psych:  No delerium/psychosis/paranoia HENT: Normocephalic, Mucus membranes moist.  No thrush Neck: Supple, No tracheal deviation Chest: No chest wall pain w good excursion CV:  Pulses intact.  Regular rhythm MS: Normal AROM mjr joints.  No obvious deformity Abdomen: Soft.  Nondistended.  Mildly tender at incisions only.  No incarcerated hernias.  Ostomy wound pink.  Drain sero-serosanguinous Ext:  SCDs BLE.  No mjr edema.  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 female  2 Days Post-Op  Procedure(s): Laparoscopic Assisted Colostomy Closure  Stable  Plan:  -anti ileus protocol -adv diet slowly -improve pain control -wean IVF -wound care -d/c drain -VTE prophylaxis- SCDs, etc -mobilize as tolerated to help recovery  Ardeth Sportsman, M.D., F.A.C.S. Gastrointestinal and Minimally Invasive Surgery [facility] [address], [address] [place], [place] [address] [phone] Main / Paging [phone] Voice Mail   02/26/2013  CARE TEAM:  PCP: Thora Lance, MD  Outpatient Care Team: Patient Care Team: Thora Lance, MD as PCP - General (Family Medicine)  Inpatient Treatment Team: Treatment Team: Attending Provider: Adolph Pollack, MD; Registered Nurse: Jonathon Bellows, RN; Technician: Velvet Bathe, NT; Registered Nurse: Bethann Goo, RN   Results:   Labs: Results for orders placed during the hospital encounter of 02/24/13 (from the past 48 hour(s))  BASIC METABOLIC PANEL     Status: Abnormal   Collection Time    02/25/13  3:55 AM      Result Value Range   Sodium 136  135 - 145 mEq/L   Potassium 4.1  3.5 - 5.1 mEq/L   Chloride 101  96 - 112 mEq/L   CO2 29  19 - 32 mEq/L   Glucose, Bld 180 (*) 70 - 99 mg/dL   BUN 7  6 - 23 mg/dL   Creatinine, Ser 9.52  0.50 - 1.10 mg/dL   Calcium 8.6  8.4 - 84.1 mg/dL   GFR calc non Af Amer >90  >90 mL/min   GFR calc Af Amer >90  >90 mL/min   Comment:            The eGFR has been calculated     using the CKD EPI equation.     This calculation has not been     validated in all clinical  situations.     eGFR's persistently     <90 mL/min signify     possible Chronic Kidney Disease.  CBC     Status: Abnormal   Collection Time    02/25/13  3:55 AM      Result Value Range   WBC 10.5  4.0 - 10.5 K/uL   RBC 3.85 (*) 3.87 - 5.11 MIL/uL   Hemoglobin 12.2  12.0 - 15.0 g/dL   HCT 16.1 (*) 09.6 - 04.5 %   MCV 87.8  78.0 - 100.0 fL   MCH 31.7  26.0 - 34.0 pg   MCHC 36.1 (*) 30.0 - 36.0 g/dL   RDW 40.9  81.1 - 91.4 %   Platelets 142 (*) 150 - 400 K/uL  CBC     Status: Abnormal   Collection Time    02/26/13  4:05 AM      Result Value Range   WBC 7.6  4.0 - 10.5 K/uL   RBC 3.80 (*) 3.87 - 5.11 MIL/uL   Hemoglobin 11.8 (*) 12.0 - 15.0 g/dL   HCT 78.2 (*) 95.6 - 21.3 %   MCV 90.8  78.0 - 100.0 fL   MCH 31.1  26.0 - 34.0 pg   MCHC 34.2   30.0 - 36.0 g/dL   RDW 08.6  57.8 - 46.9 %   Platelets 125 (*) 150 - 400 K/uL    Imaging / Studies: No results found.  Medications / Allergies: per chart  Antibiotics: Anti-infectives   Start     Dose/Rate Route Frequency Ordered Stop   02/24/13 2000  cefoTEtan (CEFOTAN) 2 g in dextrose 5 % 50 mL IVPB     2 g 100 mL/hr over 30 Minutes Intravenous Every 12 hours 02/24/13 1346 02/24/13 2100   02/24/13 0534  cefOXitin (MEFOXIN) 2 g in dextrose 5 % 50 mL IVPB     2 g 100 mL/hr over 30 Minutes Intravenous On call to O.R. 02/24/13 6295 02/24/13 2841

## 2013-02-27 MED ORDER — HYDROMORPHONE HCL PF 1 MG/ML IJ SOLN: 0.5000 mg | INTRAMUSCULAR | Status: DC | PRN

## 2013-02-27 MED ORDER — SODIUM CHLORIDE 0.9 % IJ SOLN
3.0000 mL | Freq: Two times a day (BID) | INTRAMUSCULAR | Status: DC
  Administered 2013-02-27 – 2013-03-01 (×5): 3 mL via INTRAVENOUS

## 2013-02-27 MED ORDER — SODIUM CHLORIDE 0.9 % IJ SOLN: 3.0000 mL | INTRAMUSCULAR | Status: DC | PRN

## 2013-02-27 MED ORDER — LACTATED RINGERS IV BOLUS (SEPSIS): 1000.0000 mL | Freq: Three times a day (TID) | INTRAVENOUS | Status: AC | PRN

## 2013-02-27 MED ORDER — IBUPROFEN 800 MG PO TABS
800.0000 mg | ORAL_TABLET | Freq: Four times a day (QID) | ORAL | Status: DC
  Administered 2013-02-27 – 2013-03-01 (×11): 800 mg via ORAL
  Filled 2013-02-27 (×19): qty 1

## 2013-02-27 MED ORDER — ACETAMINOPHEN 650 MG RE SUPP: 650.0000 mg | Freq: Four times a day (QID) | RECTAL | Status: DC | PRN

## 2013-02-27 MED ORDER — OXYCODONE HCL 5 MG PO TABS
5.0000 mg | ORAL_TABLET | ORAL | Status: DC | PRN
  Administered 2013-02-27: 10 mg via ORAL
  Administered 2013-02-27: 5 mg via ORAL
  Administered 2013-02-27 – 2013-03-02 (×7): 10 mg via ORAL
  Filled 2013-02-27 (×9): qty 2

## 2013-02-27 MED ORDER — ACETAMINOPHEN 325 MG PO TABS: 325.0000 mg | ORAL_TABLET | Freq: Four times a day (QID) | ORAL | Status: DC | PRN

## 2013-02-27 NOTE — Progress Notes (Signed)
Changed pt's wound on right lower abd, wet to dry.  Open area on right lower abd area where JP was dc'd yesterday.  Inserted a little wet gauze inside, and then covered with dry guaze.  Will continue to monitor.

## 2013-02-27 NOTE — Progress Notes (Signed)
Mallory Morales [id number] 07-20-1956   Subjective:  BM yesterday Feeling OK Belching  Objective:  Vital signs:  Filed Vitals:   02/26/13 0631 02/26/13 1459 02/26/13 2130 02/27/13 0500  BP: 133/71 148/86 112/74 125/68  Pulse:   82 65  Temp: 99.4 F (37.4 C) 98.9 F (37.2 C) 98.3 F (36.8 C) 98.2 F (36.8 C)  TempSrc: Oral Oral Oral Oral  Resp: 14 16 16 16   Height:      Weight:      SpO2: 99% 100% 97% 100%    Last BM Date: 02/24/13  Intake/Output   Yesterday:  03/08 0701 - 03/09 0700 In: 2157.9 [P.O.:1020; I.V.:1137.9] Out: 3600 [Urine:3500; Drains:100] This shift:  Total I/O In: -  Out: 600 [Urine:600]  Bowel function:  Flatus: NO  BM: Y  Physical Exam:  General: Pt awake/alert/oriented x4 in no acute distress.  Smiling.  Reading paper in bed Eyes: PERRL, normal EOM.  Sclera clear.  No icterus Neuro: CN II-XII intact w/o focal sensory/motor deficits. Lymph: No head/neck/groin lymphadenopathy Psych:  No delerium/psychosis/paranoia HENT: Normocephalic, Mucus membranes moist.  No thrush Neck: Supple, No tracheal deviation Chest: No chest wall pain w good excursion CV:  Pulses intact.  Regular rhythm MS: Normal AROM mjr joints.  No obvious deformity Abdomen: Soft.  Nondistended.  Mildly tender at incisions only.  No incarcerated hernias.  Ostomy wound pink.  Drain out Ext:  SCDs BLE.  No mjr edema.  No cyanosis Skin: No petechiae / purpurae  Problem List:  Principal Problem:   Colostomy in place s/p closure 02/24/13 Active Problems:   Hypertension   Cancer of descending colon   Ileus, postoperative   Anemia, iron deficiency   Assessment  Mallory Morales  57 y.o. female  3 Days Post-Op  Procedure(s): Laparoscopic Assisted Colostomy Closure  Improving  Plan:  -anti ileus protocol -adv diet  -pain control -wean IVF -wound care -VTE prophylaxis- SCDs, etc -mobilize as tolerated to help recovery  D/C patient from hospital when patient  meets criteria - prob tomorrow:  Tolerating oral intake well Ambulating in walkways Adequate pain control without IV medications Urinating  Having flatus   Ardeth Sportsman, M.D., F.A.C.S. Gastrointestinal and Minimally Invasive Surgery [facility] [address] [place], [place] [address] [phone] Main / Paging [phone] Voice Mail   02/27/2013  CARE TEAM:  PCP: Thora Lance, MD  Outpatient Care Team: Patient Care Team: Thora Lance, MD as PCP - General (Family Medicine)  Inpatient Treatment Team: Treatment Team: Attending Jomo Forand: Adolph Pollack, MD; Registered Nurse: Jonathon Bellows, RN; Technician: Velvet Bathe, NT; Registered Nurse: Bethann Goo, RN; Registered Nurse: Renelda Loma, RN   Results:   Labs: Results for orders placed during the hospital encounter of 02/24/13 (from the past 48 hour(s))  CBC     Status: Abnormal   Collection Time    02/26/13  4:05 AM      Result Value Range   WBC 7.6  4.0 - 10.5 K/uL   RBC 3.80 (*) 3.87 - 5.11 MIL/uL   Hemoglobin 11.8 (*) 12.0 - 15.0 g/dL   HCT 95.2 (*) 84.1 - 32.4 %   MCV 90.8  78.0 - 100.0 fL   MCH 31.1  26.0 - 34.0 pg   MCHC 34.2  30.0 - 36.0 g/dL   RDW 40.1  02.7 - 25.3 %   Platelets 125 (*) 150 - 400 K/uL    Imaging /  Studies: No results found.  Medications / Allergies: per chart  Antibiotics: Anti-infectives   Start     Dose/Rate Route Frequency Ordered Stop   02/24/13 2000  cefoTEtan (CEFOTAN) 2 g in dextrose 5 % 50 mL IVPB     2 g 100 mL/hr over 30 Minutes Intravenous Every 12 hours 02/24/13 1346 02/24/13 2100   02/24/13 0534  cefOXitin (MEFOXIN) 2 g in dextrose 5 % 50 mL IVPB     2 g 100 mL/hr over 30 Minutes Intravenous On call to O.R. 02/24/13 8416 02/24/13 6063

## 2013-02-28 ENCOUNTER — Encounter (HOSPITAL_COMMUNITY): Payer: Self-pay | Admitting: General Surgery

## 2013-02-28 MED ORDER — ALVIMOPAN 12 MG PO CAPS
12.0000 mg | ORAL_CAPSULE | Freq: Two times a day (BID) | ORAL | Status: DC
  Filled 2013-02-28: qty 1

## 2013-02-28 MED ORDER — OXYCODONE HCL 5 MG PO TABS: 5.0000 mg | ORAL_TABLET | ORAL | Status: DC | PRN

## 2013-02-28 NOTE — Progress Notes (Signed)
Pharmacy Brief Note - Alvimopan (Entereg)  The standing order set for alvimopan (Entereg) now includes an automatic order to discontinue the drug after the patient has had a bowel movement.  The change was approved by the Pharmacy & Therapeutics Committee and the Medical Executive Committee.    This patient has had a bowel movement documented by nursing.  Therefore, alvimopan has been discontinued.  If there are questions, please contact the pharmacy at [phone].  Thank you  Geoffry Paradise, PharmD.   Pager:  [phone] 2:41 PM

## 2013-02-28 NOTE — Progress Notes (Signed)
4 Days Post-Op  Subjective: Tolerating full liquid diet.  Feels bloated.  Has not passed gas.  Having greenish, liquid BMs.  Pain at previous ostomy site.  Objective: Vital signs in last 24 hours: Temp:  [97.9 F (36.6 C)-98.7 F (37.1 C)] 98.3 F (36.8 C) (03/10 0433) Pulse Rate:  [72-84] 75 (03/10 0433) Resp:  [16-20] 20 (03/10 0433) BP: (111-138)/(71-83) 138/83 mmHg (03/10 0433) SpO2:  [98 %-100 %] 100 % (03/10 0433) Last BM Date: 02/27/13  Intake/Output from previous day: 03/09 0701 - 03/10 0700 In: 1460 [P.O.:660; I.V.:800] Out: 1000 [Urine:1000] Intake/Output this shift:    PE: General- In NAD Abdomen-soft, distended, hypoactive bowel sounds, open wound clean, other incisions clean and intact  Lab Results:   Recent Labs  02/26/13 0405  WBC 7.6  HGB 11.8*  HCT 34.5*  PLT 125*   BMET No results found for this basename: NA, K, CL, CO2, GLUCOSE, BUN, CREATININE, CALCIUM,  in the last 72 hours PT/INR No results found for this basename: LABPROT, INR,  in the last 72 hours Comprehensive Metabolic Panel:    Component Value Date/Time   NA 136 02/25/2013 0355   K 4.1 02/25/2013 0355   CL 101 02/25/2013 0355   CO2 29 02/25/2013 0355   BUN 7 02/25/2013 0355   CREATININE 0.72 02/25/2013 0355   GLUCOSE 180* 02/25/2013 0355   CALCIUM 8.6 02/25/2013 0355   AST 15 02/17/2013 1000   ALT 12 02/17/2013 1000   ALKPHOS 60 02/17/2013 1000   BILITOT 1.0 02/17/2013 1000   PROT 7.5 02/17/2013 1000   ALBUMIN 4.2 02/17/2013 1000     Studies/Results: No results found.  Anti-infectives: Anti-infectives   Start     Dose/Rate Route Frequency Ordered Stop   02/24/13 2000  cefoTEtan (CEFOTAN) 2 g in dextrose 5 % 50 mL IVPB     2 g 100 mL/hr over 30 Minutes Intravenous Every 12 hours 02/24/13 1346 02/24/13 2100   02/24/13 0534  cefOXitin (MEFOXIN) 2 g in dextrose 5 % 50 mL IVPB     2 g 100 mL/hr over 30 Minutes Intravenous On call to O.R. 02/24/13 0534 02/24/13 0742       Assessment Principal Problem:   Colostomy in place s/p closure 02/24/13-still with some ileus Active Problems:   Hypertension-BP within normal limits      LOS: 4 days   Plan: Advance to solid diet.   ROSENBOWER,TODD J 02/28/2013

## 2013-03-01 LAB — MAGNESIUM: Magnesium: 2.1 mg/dL (ref 1.5–2.5)

## 2013-03-01 LAB — BASIC METABOLIC PANEL
CO2: 30 mEq/L (ref 19–32)
Calcium: 9.2 mg/dL (ref 8.4–10.5)
Creatinine, Ser: 0.73 mg/dL (ref 0.50–1.10)
GFR calc Af Amer: >90 mL/min (ref >90)
Sodium: 137 mEq/L (ref 135–145)

## 2013-03-01 MED ORDER — KCL-LACTATED RINGERS-D5W 20 MEQ/L IV SOLN
INTRAVENOUS | Status: DC
  Administered 2013-03-01: 14:00:00 via INTRAVENOUS
  Filled 2013-03-01 (×3): qty 1000

## 2013-03-01 NOTE — Progress Notes (Signed)
5 Days Post-Op  Subjective: Passed gas once this AM.  Had some nausea this AM so did not eat breakfast.  Had a liquid BM.  Still feels bloated.  Drinking some water.  Objective: Vital signs in last 24 hours: Temp:  [98.1 F (36.7 C)-99.4 F (37.4 C)] 98.1 F (36.7 C) (03/11 0828) Pulse Rate:  [80-94] 80 (03/11 0828) Resp:  [18-20] 18 (03/11 0828) BP: (122-145)/(77-89) 145/89 mmHg (03/11 0828) SpO2:  [97 %-100 %] 100 % (03/11 0828) Last BM Date: 03/01/13  Intake/Output from previous day: 03/10 0701 - 03/11 0700 In: 240 [P.O.:240] Out: -  Intake/Output this shift: Total I/O In: 200 [P.O.:200] Out: -   PE: General- In NAD Abdomen-soft, still distended, hypoactive bowel sounds, open wound clean, other incisions clean and intact  Lab Results:  No results found for this basename: WBC, HGB, HCT, PLT,  in the last 72 hours BMET No results found for this basename: NA, K, CL, CO2, GLUCOSE, BUN, CREATININE, CALCIUM,  in the last 72 hours PT/INR No results found for this basename: LABPROT, INR,  in the last 72 hours Comprehensive Metabolic Panel:    Component Value Date/Time   NA 136 02/25/2013 0355   K 4.1 02/25/2013 0355   CL 101 02/25/2013 0355   CO2 29 02/25/2013 0355   BUN 7 02/25/2013 0355   CREATININE 0.72 02/25/2013 0355   GLUCOSE 180* 02/25/2013 0355   CALCIUM 8.6 02/25/2013 0355   AST 15 02/17/2013 1000   ALT 12 02/17/2013 1000   ALKPHOS 60 02/17/2013 1000   BILITOT 1.0 02/17/2013 1000   PROT 7.5 02/17/2013 1000   ALBUMIN 4.2 02/17/2013 1000     Studies/Results: No results found.  Anti-infectives: Anti-infectives   Start     Dose/Rate Route Frequency Ordered Stop   02/24/13 2000  cefoTEtan (CEFOTAN) 2 g in dextrose 5 % 50 mL IVPB     2 g 100 mL/hr over 30 Minutes Intravenous Every 12 hours 02/24/13 1346 02/24/13 2100   02/24/13 0534  cefOXitin (MEFOXIN) 2 g in dextrose 5 % 50 mL IVPB     2 g 100 mL/hr over 30 Minutes Intravenous On call to O.R. 02/24/13 0534 02/24/13 0742      Assessment Principal Problem:   Colostomy in place s/p closure 02/24/13-some ileus continues. Active Problems:   Hypertension-DBP slightly high.      LOS: 5 days   Plan: Check electrolytes.  Restart IVF.  Wait for ileus to resolve.   Mallory Morales J 03/01/2013

## 2013-03-01 NOTE — Progress Notes (Signed)
Pt's IV infiltrated.  Pt does not want a new IV restarted.  Pt currently eating a regular diet without difficulty.

## 2013-03-02 NOTE — Care Management Note (Signed)
    Page 1 of 1   03/02/2013     4:34:42 PM   CARE MANAGEMENT NOTE 03/02/2013  Patient:  Mallory Morales, Mallory Morales   Account Number:  [id number]  Date Initiated:  03/01/2013  Documentation initiated by:  Colleen Can  Subjective/Objective Assessment:   dx colostomy take down, colectomy, lysis of adhesions     Action/Plan:   Cm spoke with patient. Plans are for her to return to her home in [place] where her sister will be caregiver. There are no HH or DME needs. Pt has been ambulating without difficulty   Anticipated DC Date:  03/02/2013   Anticipated DC Plan:  HOME/SELF CARE      DC Planning Services  CM consult      Choice offered to / List presented to:             Status of service:  Completed, signed off Medicare Important Message given?   (If response is "NO", the following Medicare IM given date fields will be blank) Date Medicare IM given:   Date Additional Medicare IM given:    Discharge Disposition:  HOME/SELF CARE  Per UR Regulation:  Reviewed for med. necessity/level of care/duration of stay  If discussed at Long Length of Stay Meetings, dates discussed:    Comments:

## 2013-03-02 NOTE — Progress Notes (Signed)
6 Days Post-Op  Subjective: Feels much better.  Passing a lot of gas.  Tolerating diet. Wants to go home.  Objective: Vital signs in last 24 hours: Temp:  [97.5 F (36.4 C)-98.1 F (36.7 C)] 97.5 F (36.4 C) (03/12 0614) Pulse Rate:  [68-81] 73 (03/12 0614) Resp:  [14-18] 14 (03/12 0614) BP: (112-145)/(74-89) 118/77 mmHg (03/12 0614) SpO2:  [98 %-100 %] 100 % (03/12 0614) Last BM Date: 03/01/13  Intake/Output from previous day: 03/11 0701 - 03/12 0700 In: 1220 [P.O.:1220] Out: -  Intake/Output this shift: Total I/O In: 240 [P.O.:240] Out: -   PE: General- In NAD Abdomen-soft, less distended, open wound clean, other incisions clean and intact  Lab Results:  No results found for this basename: WBC, HGB, HCT, PLT,  in the last 72 hours BMET  Recent Labs  03/01/13 1228  NA 137  K 3.8  CL 98  CO2 30  GLUCOSE 101*  BUN 7  CREATININE 0.73  CALCIUM 9.2   PT/INR No results found for this basename: LABPROT, INR,  in the last 72 hours Comprehensive Metabolic Panel:    Component Value Date/Time   NA 137 03/01/2013 1228   K 3.8 03/01/2013 1228   CL 98 03/01/2013 1228   CO2 30 03/01/2013 1228   BUN 7 03/01/2013 1228   CREATININE 0.73 03/01/2013 1228   GLUCOSE 101* 03/01/2013 1228   CALCIUM 9.2 03/01/2013 1228   AST 15 02/17/2013 1000   ALT 12 02/17/2013 1000   ALKPHOS 60 02/17/2013 1000   BILITOT 1.0 02/17/2013 1000   PROT 7.5 02/17/2013 1000   ALBUMIN 4.2 02/17/2013 1000     Studies/Results: No results found.  Anti-infectives: Anti-infectives   Start     Dose/Rate Route Frequency Ordered Stop   02/24/13 2000  cefoTEtan (CEFOTAN) 2 g in dextrose 5 % 50 mL IVPB     2 g 100 mL/hr over 30 Minutes Intravenous Every 12 hours 02/24/13 1346 02/24/13 2100   02/24/13 0534  cefOXitin (MEFOXIN) 2 g in dextrose 5 % 50 mL IVPB     2 g 100 mL/hr over 30 Minutes Intravenous On call to O.R. 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 Active Problems:   Hypertension-DBP better     LOS: 6 days   Plan: Discharge.  Instructions given.   ROSENBOWER,TODD J 03/02/2013

## 2013-03-03 NOTE — Discharge Summary (Signed)
Physician Discharge Summary  Patient ID: Mallory Morales MRN: [id number] DOB/AGE: 55-Jun-1957 55 y.o.  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 There were numerous adhesions and this required 2 hours of lysis of adhesions. The colostomy site was left open and packed with moist gauze. She developed a postoperative ileus which slowly resolved with time. By the day of her discharge she was tolerating a regular diet, passing gas, moving her bowels, and the open wound was clean. The limited lower midline moaning trocar site wounds were clean as well. She was discharged with instructions and will followup in the office next week for staple removal.  Consults: none  Significant Diagnostic Studies: none  Treatments: surgery: Laparoscopic lysis of adhesions and colostomy closure  Discharge Exam: Blood pressure 120/81, pulse 77, temperature 98 F (36.7 C), temperature source Oral, resp. rate 16, height 5\' 5"  (1.651 m), weight 163 lb 6 oz (74.106 kg), SpO2 95.00%.   Disposition: 01-Home or Self Care   Future Appointments Provider Department Dept Phone   03/09/2013 10:00 AM [facility] Surgery Nurse [facility] [place] [phone]   04/25/2013 9:00 AM [facility] [phone]   Patient to arrive 15 minutes prior to appointment time. Patient to pick up oral contrast at least 1 day prior to exam. No solid food 4 hours prior to exam. Liquids and Medicines are okay.   04/25/2013 9:30 AM [facility] [phone]   Patient to arrive 15 minutes prior to appointment time. No solid food 4 hours prior to exam. Liquids and Medicines are okay.   05/02/2013 8:30 AM Rachael Fee [facility] [phone]   05/02/2013 9:00 AM Josph Macho, MD [facility] [phone]       Medication List    TAKE these medications       acetaminophen 500 MG tablet  Commonly known as:  TYLENOL  Take 500 mg by mouth every 6 (six) hours as needed for pain. For pain     hydrochlorothiazide 25 MG tablet  Commonly known as:  HYDRODIURIL  Take 25 mg by mouth every morning.     oxyCODONE 5 MG immediate release tablet  Commonly known as:  Oxy IR/ROXICODONE  Take 1-2 tablets (5-10 mg total) by mouth every 4 (four) hours as needed.     simvastatin 40 MG tablet  Commonly known as:  ZOCOR  Take 40 mg by mouth every morning.           Follow-up Information   Schedule an appointment as soon as possible for a visit in 2 weeks to follow up.      Signed: Adolph Pollack 03/03/2013, 9:37 AM

## 2013-03-09 ENCOUNTER — Encounter (INDEPENDENT_AMBULATORY_CARE_PROVIDER_SITE_OTHER): Payer: Self-pay | Admitting: General Surgery

## 2013-03-09 ENCOUNTER — Ambulatory Visit (INDEPENDENT_AMBULATORY_CARE_PROVIDER_SITE_OTHER): Payer: 59 | Admitting: General Surgery

## 2013-03-09 VITALS — BP 124/83 | HR 62 | Temp 99.0°F | Ht 65.0 in | Wt 157.0 lb

## 2013-03-09 DIAGNOSIS — Z4802 Encounter for removal of sutures: Secondary | ICD-10-CM

## 2013-03-09 NOTE — Patient Instructions (Signed)
Patient came in to have staples removed and she stated that she was having LLQ pain from the drain removal site. I went and talked to Dr Maisie Fus who came in to see the patient and talk to her about the pain, Dr Maisie Fus exam the patient and asked if she was running any fevers, having redness around the site, and ask if her BM was ok, and all the answer ok from the patient. She had me make the patient a apt to come back in one week to see Dr Abbey Chatters. Patient is coming back on 03-16-13 and she is aware that if she starts running a fever or having more LLQ pain to call the office and we can get her an apt to see one of the doctors here. I had Susie come in with me to watch and I had her to place steri streps over the surgery site and I placed and clean gauge over the colostomy opening.

## 2013-03-16 ENCOUNTER — Encounter (INDEPENDENT_AMBULATORY_CARE_PROVIDER_SITE_OTHER): Payer: Self-pay | Admitting: General Surgery

## 2013-03-16 ENCOUNTER — Ambulatory Visit (INDEPENDENT_AMBULATORY_CARE_PROVIDER_SITE_OTHER): Payer: 59 | Admitting: General Surgery

## 2013-03-16 VITALS — BP 108/84 | HR 68 | Temp 97.3°F | Resp 16 | Ht 65.0 in | Wt 159.0 lb

## 2013-03-16 DIAGNOSIS — Z9889 Other specified postprocedural states: Secondary | ICD-10-CM

## 2013-03-16 NOTE — Patient Instructions (Signed)
Increase the fiber in her diet. Take a multivitamin daily. Continue current wound care. Continue light activities.

## 2013-03-16 NOTE — Progress Notes (Signed)
Procedure:  Laparoscopic assisted colostomy closure  Date:  02/24/2013  Pathology:  Benign  History:  She is here for her first postoperative visit. Her bowels are moving about every time after she eats. Her wound is healing well.  Exam: General- Is in NAD. Abdomen-soft, midline incision is clean and intact, left-sided colostomy site is healing in well by secondary intention.  Assessment:  Doing well postoperatively. Having some bowel irregularity which is not uncommon.  Plan:  Increase fiber in the diet. Take a multivitamin. Continue light activities. Continue current wound care. Return visit 3 weeks.

## 2013-03-28 ENCOUNTER — Encounter (INDEPENDENT_AMBULATORY_CARE_PROVIDER_SITE_OTHER): Payer: 59 | Admitting: Surgery

## 2013-03-28 ENCOUNTER — Ambulatory Visit (INDEPENDENT_AMBULATORY_CARE_PROVIDER_SITE_OTHER): Payer: 59 | Admitting: Surgery

## 2013-03-28 ENCOUNTER — Encounter (INDEPENDENT_AMBULATORY_CARE_PROVIDER_SITE_OTHER): Payer: Self-pay | Admitting: Surgery

## 2013-03-28 VITALS — BP 132/78 | HR 74 | Temp 98.2°F | Resp 18 | Ht 65.0 in | Wt 158.6 lb

## 2013-03-28 DIAGNOSIS — K922 Gastrointestinal hemorrhage, unspecified: Secondary | ICD-10-CM | POA: Insufficient documentation

## 2013-03-28 DIAGNOSIS — C186 Malignant neoplasm of descending colon: Secondary | ICD-10-CM

## 2013-03-28 DIAGNOSIS — Z933 Colostomy status: Secondary | ICD-10-CM

## 2013-03-28 LAB — CBC WITH DIFFERENTIAL/PLATELET
Basophils Relative: 0 % (ref 0–1)
Eosinophils Absolute: 0.1 10*3/uL (ref 0.0–0.7)
HCT: 36.6 % (ref 36.0–46.0)
Hemoglobin: 12.5 g/dL (ref 12.0–15.0)
MCH: 30.8 pg (ref 26.0–34.0)
MCHC: 34.2 g/dL (ref 30.0–36.0)
Monocytes Absolute: 0.3 10*3/uL (ref 0.1–1.0)
Monocytes Relative: 6 % (ref 3–12)
Neutrophils Relative %: 60 % (ref 43–77)
RDW: 13.8 % (ref 11.5–15.5)

## 2013-03-28 NOTE — Progress Notes (Signed)
Subjective:     Patient ID: Mallory Morales, female   DOB: May 12, 1956, 57 y.o.   MRN: [id number]  HPI  Mallory Morales  [birth year] [id number]  Patient Care Team: Thora Lance, MD as PCP - General (Family Medicine)  This patient is a 57 y.o.female who presents today for surgical evaluation at the request of self.   Reason for visit: Rectal bleeding.  Status post colostomy takedown one month ago.  Pleasant active female.  Found to have an obstructing colon cancer that required urgent surgery Jan 2013.  Recovered.  Had a PE that required anticoagulation.  Now off of all that.  Underwent open colostomy takedown last month 02/24/2013.  Recovered well and went home within a week.  Last week she noticed some blood with her bowel movements.  Got a little worse.  No fever.  No lightheadedness.  No chills.  Not taking any nonsteroidals or aspirin or blood thinners.  Having two soft bowel movements a day which is much better than her bad constipation.  Drinking one glass of Chardonnay at night but otherwise avoiding alcohol.  Energy level improving.  Appetite improving.  Was concerned and wished to be seen.  No history of hemorrhoid problems.  No pain with defecation.  No bad episodes of constipation or diarrhea. No personal nor family history of GI/colon cancer, inflammatory bowel disease, irritable bowel syndrome, allergy such as Celiac Sprue, dietary/dairy problems, colitis, ulcers nor gastritis.  No recent sick contacts/gastroenteritis.  No travel outside the country.  No changes in diet.    Patient Active Problem List  Diagnosis  . Hypertension  . Dyslipidemia  . Cancer of descending colon  . PE (pulmonary embolism)  . Femoral DVT (deep venous thrombosis)  . Anemia, iron deficiency  . 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 Hypertension   . Hyperlipemia   . Anemia, iron deficiency 01/27/2012  . Clotting disorder   . Pulmonary embolism    taking injections -last done 1'14(Arixtra)  . DVT (deep venous thrombosis)   . Complication of anesthesia     "hard time getting breath" post op  . Colon cancer     emergency surgery/with colostomy done-no further tx.    Past Surgical History  Procedure Laterality Date  . Abdominal hysterectomy    . Hernia repair    . Colon resection  01/14/2012    Procedure: COLON RESECTION;  Surgeon: Adolph Pollack, MD;  Location: [facility] OR;  Service: General;  Laterality: N/A;  left colectomy, mobilization of splenic flexure, ostomy.  . Colostomy    . Colon surgery    . Colostomy takedown N/A 02/24/2013    Procedure: Laparoscopic Assisted Colostomy Closure;  Surgeon: Adolph Pollack, MD;  Location: [facility] ORS;  Service: General;  Laterality: N/A;  Laparoscopic Assisted Colostomy Closure    History   Social History  . Marital Status: Married    Spouse Name: N/A    Number of Children: N/A  . Years of Education: N/A   Occupational History  . Not on file.   Social History Main Topics  . Smoking status: Former Smoker    Quit date: 05/16/1985  . Smokeless tobacco: Never Used  . Alcohol Use: 0.6 oz/week    1 Glasses of wine per week     Comment: Occ. wine  . Drug Use: No  . Sexually Active: No   Other Topics Concern  . Not on file  Social History Narrative  . No narrative on file    Family History  Problem Relation Age of Onset  . Heart attack Father   . Hypertension Father   . Heart disease Father   . Stomach cancer Sister   . Pancreatic cancer Brother   . Cancer Brother     pancreatic    Current Outpatient Prescriptions  Medication Sig Dispense Refill  . simvastatin (ZOCOR) 40 MG tablet Take 40 mg by mouth every morning.      Marland Kitchen acetaminophen (TYLENOL) 500 MG tablet Take 500 mg by mouth every 6 (six) hours as needed for pain. For pain      . hydrochlorothiazide (HYDRODIURIL) 25 MG tablet Take 25 mg by mouth every morning.       No current facility-administered medications for  this visit.     Allergies  Allergen Reactions  . Strawberry Swelling    Face only. Breathing not affected.    BP 132/78  Pulse 74  Temp(Src) 98.2 F (36.8 C)  Resp 18  Ht 5\' 5"  (1.651 m)  Wt 158 lb 9.6 oz (71.94 kg)  BMI 26.39 kg/m2  No results found.   Review of Systems  Constitutional: Negative for fever, chills and diaphoresis.  HENT: Negative for ear pain, sore throat and trouble swallowing.   Eyes: Negative for photophobia and visual disturbance.  Respiratory: Negative for cough and choking.   Cardiovascular: Negative for chest pain and palpitations.  Gastrointestinal: Positive for blood in stool. Negative for nausea, vomiting, abdominal pain, diarrhea, constipation, abdominal distention, anal bleeding and rectal pain.       BM BID - much better than preop  Genitourinary: Negative for dysuria, frequency and difficulty urinating.  Musculoskeletal: Negative for myalgias and gait problem.  Skin: Negative for color change, pallor and rash.  Neurological: Negative for dizziness, speech difficulty, weakness and numbness.  Hematological: Negative for adenopathy.  Psychiatric/Behavioral: Negative for confusion and agitation. The patient is not nervous/anxious.        Objective:   Physical Exam  Constitutional: She is oriented to person, place, and time. She appears well-developed and well-nourished. No distress.  HENT:  Head: Normocephalic.  Mouth/Throat: Oropharynx is clear and moist. No oropharyngeal exudate.  Eyes: Conjunctivae and EOM are normal. Pupils are equal, round, and reactive to light. No scleral icterus.  Neck: Normal range of motion. Neck supple. No tracheal deviation present.  Cardiovascular: Normal rate, regular rhythm and intact distal pulses.   Pulmonary/Chest: Effort normal and breath sounds normal. No respiratory distress. She exhibits no tenderness.  Abdominal: Soft. She exhibits no distension and no mass. There is no tenderness. There is no CVA  tenderness. No hernia. Hernia confirmed negative in the ventral area, confirmed negative in the right inguinal area and confirmed negative in the left inguinal area.    Genitourinary: No vaginal discharge found.  Exam done with assistance of female Medical Assistant in the room.  Perianal skin clean with good hygiene.  No pruritis.  No external skin tags / hemorrhoids of significance.  No pilonidal disease.  No fissure.  No abscess/fistula.    Tolerates digital and anoscopic rectal exam.  Normal sphincter tone.  No rectal masses.  Hemorrhoidal piles WNL.  No blood nor stool in the rectal vault.   Musculoskeletal: Normal range of motion. She exhibits no tenderness.  Lymphadenopathy:    She has no cervical adenopathy.       Right: No inguinal adenopathy present.       Left: No  inguinal adenopathy present.  Neurological: She is alert and oriented to person, place, and time. No cranial nerve deficit. She exhibits normal muscle tone. Coordination normal.  Skin: Skin is warm and dry. No rash noted. She is not diaphoretic. No erythema.  Psychiatric: She has a normal mood and affect. Her behavior is normal. Judgment and thought content normal.       Assessment:     Rectal bleeding not anal.  Most likely from anastomosis.  Status post colostomy takedown 02/24/2013     Plan:     Most likely the bleeding will taper off on its own.  Check CBC to see if has anemia.  If so, start iron.  If persists, may be an endoscopy to isolate and treat bleeding, probable OK now 4 weeks post-oefrain from all anticoagulation which she is doing.  Increase activity as tolerated to regular activity.  Do not push through pain.  Diet as tolerated. Fiber bowel regimen to keep stools soft & avoid problems.  Instructions discussed.  Followup with primary care physician for other health issues as would normally be done.  Questions answered.  The patient expressed understanding and appreciation    p

## 2013-03-28 NOTE — Patient Instructions (Addendum)
Check CBC to see if you are anemic.  Avoid all blood thinners, aspirin, nonsteroidals such as ibuprofen or naproxen.  Rectal Bleeding Rectal bleeding is when blood passes out of the anus. It is usually a sign that something is wrong. It may not be serious, but it should always be evaluated. Rectal bleeding may present as bright red blood or extremely dark stools. The color may range from dark red or maroon to black (like tar). It is important that the cause of rectal bleeding be identified so treatment can be started and the problem corrected. CAUSES   Hemorrhoids. These are enlarged (dilated) blood vessels or veins in the anal or rectal area.  Fistulas. Theseare abnormal, burrowing channels that usually run from inside the rectum to the skin around the anus. They can bleed.  Anal fissures. This is a tear in the tissue of the anus. Bleeding occurs with bowel movements.  Diverticulosis. This is a condition in which pockets or sacs project from the bowel wall. Occasionally, the sacs can bleed.  Diverticulitis. Thisis an infection involving diverticulosis of the colon.  Proctitis and colitis. These are conditions in which the rectum, colon, or both, can become inflamed and pitted (ulcerated).  Polyps and cancer. Polyps are non-cancerous (benign) growths in the colon that may bleed. Certain types of polyps turn into cancer.  Protrusion of the rectum. Part of the rectum can project from the anus and bleed.  Certain medicines.  Intestinal infections.  Blood vessel abnormalities. HOME CARE INSTRUCTIONS  Eat a high-fiber diet to keep your stool soft.  Limit activity.  Drink enough fluids to keep your urine clear or pale yellow.  Warm baths may be useful to soothe rectal pain.  Follow up with your caregiver as directed. SEEK IMMEDIATE MEDICAL CARE IF:  You develop increased bleeding.  You have black or dark red stools.  You vomit blood or material that looks like coffee  grounds.  You have abdominal pain or tenderness.  You have a fever.  You feel weak, nauseous, or you faint.  You have severe rectal pain or you are unable to have a bowel movement. MAKE SURE YOU:  Understand these instructions.  Will watch your condition.  Will get help right away if you are not doing well or get worse. Document Released: 05/30/2002 Document Revised: 03/01/2012 Document Reviewed: 05/25/2011 [facility] Patient Information 2013 [place], [place].  GETTING TO GOOD BOWEL HEALTH. Irregular bowel habits such as constipation and diarrhea can lead to many problems over time.  Having one soft bowel movement a day is the most important way to prevent further problems.  The anorectal canal is designed to handle stretching and feces to safely manage our ability to get rid of solid waste (feces, poop, stool) out of our body.  BUT, hard constipated stools can act like ripping concrete bricks and diarrhea can be a burning fire to this very sensitive area of our body, causing inflamed hemorrhoids, anal fissures, increasing risk is perirectal abscesses, abdominal pain/bloating, an making irritable bowel worse.     The goal: ONE SOFT BOWEL MOVEMENT A DAY!  To have soft, regular bowel movements:    Drink at least 8 tall glasses of water a day.     Take plenty of fiber.  Fiber is the undigested part of plant food that passes into the colon, acting s "natures broom" to encourage bowel motility and movement.  Fiber can absorb and hold large amounts of water. This results in a larger, bulkier stool, which  is soft and easier to pass. Work gradually over several weeks up to 6 servings a day of fiber (25g a day even more if needed) in the form of: o Vegetables -- Root (potatoes, carrots, turnips), leafy green (lettuce, salad greens, celery, spinach), or cooked high residue (cabbage, broccoli, etc) o Fruit -- Fresh (unpeeled skin & pulp), Dried (prunes, apricots, cherries, etc ),  or stewed ( applesauce)   o Whole grain breads, pasta, etc (whole wheat)  o Bran cereals    Bulking Agents -- This type of water-retaining fiber generally is easily obtained each day by one of the following:  o Psyllium bran -- The psyllium plant is remarkable because its ground seeds can retain so much water. This product is available as Metamucil, Konsyl, Effersyllium, Per Diem Fiber, or the less expensive generic preparation in drug and health food stores. Although labeled a laxative, it really is not a laxative.  o Methylcellulose -- This is another fiber derived from wood which also retains water. It is available as Citrucel. o Polyethylene Glycol - and "artificial" fiber commonly called Miralax or Glycolax.  It is helpful for people with gassy or bloated feelings with regular fiber o Flax Seed - a less gassy fiber than psyllium   No reading or other relaxing activity while on the toilet. If bowel movements take longer than 5 minutes, you are too constipated   AVOID CONSTIPATION.  High fiber and water intake usually takes care of this.  Sometimes a laxative is needed to stimulate more frequent bowel movements, but    Laxatives are not a good long-term solution as it can wear the colon out. o Osmotics (Milk of Magnesia, Fleets phosphosoda, Magnesium citrate, MiraLax, GoLytely) are safer than  o Stimulants (Senokot, Castor Oil, Dulcolax, Ex Lax)    o Do not take laxatives for more than 7days in a row.    IF SEVERELY CONSTIPATED, try a Bowel Retraining Program: o Do not use laxatives.  o Eat a diet high in roughage, such as bran cereals and leafy vegetables.  o Drink six (6) ounces of prune or apricot juice each morning.  o Eat two (2) large servings of stewed fruit each day.  o Take one (1) heaping tablespoon of a psyllium-based bulking agent twice a day. Use sugar-free sweetener when possible to avoid excessive calories.  o Eat a normal breakfast.  o Set aside 15 minutes after breakfast to sit on the toilet, but do  not strain to have a bowel movement.  o If you do not have a bowel movement by the third day, use an enema and repeat the above steps.    Controlling diarrhea o Switch to liquids and simpler foods for a few days to avoid stressing your intestines further. o Avoid dairy products (especially milk & ice cream) for a short time.  The intestines often can lose the ability to digest lactose when stressed. o Avoid foods that cause gassiness or bloating.  Typical foods include beans and other legumes, cabbage, broccoli, and dairy foods.  Every person has some sensitivity to other foods, so listen to our body and avoid those foods that trigger problems for you. o Adding fiber (Citrucel, Metamucil, psyllium, Miralax) gradually can help thicken stools by absorbing excess fluid and retrain the intestines to act more normally.  Slowly increase the dose over a few weeks.  Too much fiber too soon can backfire and cause cramping & bloating. o Probiotics (such as active yogurt, Align, etc) may help  repopulate the intestines and colon with normal bacteria and calm down a sensitive digestive tract.  Most studies show it to be of mild help, though, and such products can be costly. o Medicines:   Bismuth subsalicylate (ex. Kayopectate, Pepto Bismol) every 30 minutes for up to 6 doses can help control diarrhea.  Avoid if pregnant.   Loperamide (Immodium) can slow down diarrhea.  Start with two tablets (4mg  total) first and then try one tablet every 6 hours.  Avoid if you are having fevers or severe pain.  If you are not better or start feeling worse, stop all medicines and call your doctor for advice o Call your doctor if you are getting worse or not better.  Sometimes further testing (cultures, endoscopy, X-ray studies, bloodwork, etc) may be needed to help diagnose and treat the cause of the diarrhea. o

## 2013-03-29 ENCOUNTER — Telehealth (INDEPENDENT_AMBULATORY_CARE_PROVIDER_SITE_OTHER): Payer: Self-pay

## 2013-03-29 NOTE — Telephone Encounter (Signed)
LMOV for pt to call to f/u up on lab results.  Pls ask for Brennyn Haisley.

## 2013-03-29 NOTE — Telephone Encounter (Signed)
Called pt to notify her that the labs were normal per Dr Michaell Cowing. Pt advised if any problems to call our office. The pt has a f/u appt with DR Abbey Chatters on 04/04/13.

## 2013-03-29 NOTE — Progress Notes (Signed)
Spoke with pt this morning and gave her lab results.  Assured her that per Dr. Michaell Cowing her Hgb is in the normal range, and he found no signs of any anorectal bleeding.  She will call us if she needs to.

## 2013-04-04 ENCOUNTER — Encounter (INDEPENDENT_AMBULATORY_CARE_PROVIDER_SITE_OTHER): Payer: Self-pay | Admitting: General Surgery

## 2013-04-04 ENCOUNTER — Ambulatory Visit (INDEPENDENT_AMBULATORY_CARE_PROVIDER_SITE_OTHER): Payer: 59 | Admitting: General Surgery

## 2013-04-04 VITALS — BP 140/88 | HR 74 | Temp 98.3°F | Resp 18 | Ht 65.0 in | Wt 160.6 lb

## 2013-04-04 DIAGNOSIS — Z9889 Other specified postprocedural states: Secondary | ICD-10-CM

## 2013-04-04 NOTE — Progress Notes (Signed)
Procedure:  Laparoscopic assisted colostomy closure  Date:  02/24/2013  Pathology:  Benign  History:  She is here for another postoperative visit. Her bowels still are moving about every time after she eats.  No further rectal bleeding. Her wound is healing well.  Exam: General- Is in NAD. Abdomen-soft, midline incision is clean and intact, left-sided colostomy site is healed.  Assessment:  Rectal bleeding has resolved.  Improving overall.  Plan:   Resume normal activities as tolerated in 2 weeks.  Return visit in 6 weeks.

## 2013-04-04 NOTE — Patient Instructions (Signed)
2 weeks from now may resume normal activities as tolerated as we discussed

## 2013-04-21 ENCOUNTER — Encounter (INDEPENDENT_AMBULATORY_CARE_PROVIDER_SITE_OTHER): Payer: Self-pay | Admitting: *Deleted

## 2013-04-25 ENCOUNTER — Ambulatory Visit (HOSPITAL_BASED_OUTPATIENT_CLINIC_OR_DEPARTMENT_OTHER)
Admission: RE | Admit: 2013-04-25 | Discharge: 2013-04-25 | Disposition: A | Discharge status: Home / Self Care | Payer: 59 | Source: Ambulatory Visit | Attending: Hematology & Oncology | Admitting: Hematology & Oncology

## 2013-04-25 ENCOUNTER — Encounter (HOSPITAL_BASED_OUTPATIENT_CLINIC_OR_DEPARTMENT_OTHER): Payer: Self-pay

## 2013-04-25 DIAGNOSIS — Z85038 Personal history of other malignant neoplasm of large intestine: Secondary | ICD-10-CM | POA: Insufficient documentation

## 2013-04-25 DIAGNOSIS — R918 Other nonspecific abnormal finding of lung field: Secondary | ICD-10-CM | POA: Insufficient documentation

## 2013-04-25 DIAGNOSIS — Z9049 Acquired absence of other specified parts of digestive tract: Secondary | ICD-10-CM | POA: Insufficient documentation

## 2013-04-25 DIAGNOSIS — Z09 Encounter for follow-up examination after completed treatment for conditions other than malignant neoplasm: Secondary | ICD-10-CM | POA: Insufficient documentation

## 2013-04-25 DIAGNOSIS — C186 Malignant neoplasm of descending colon: Secondary | ICD-10-CM

## 2013-04-25 MED ORDER — IOHEXOL 300 MG/ML  SOLN
100.0000 mL | Freq: Once | INTRAMUSCULAR | Status: AC | PRN
  Administered 2013-04-25: 100 mL via INTRAVENOUS

## 2013-04-27 ENCOUNTER — Encounter: Payer: Self-pay | Admitting: *Deleted

## 2013-05-02 ENCOUNTER — Other Ambulatory Visit (HOSPITAL_BASED_OUTPATIENT_CLINIC_OR_DEPARTMENT_OTHER): Payer: 59 | Admitting: Lab

## 2013-05-02 ENCOUNTER — Ambulatory Visit (HOSPITAL_BASED_OUTPATIENT_CLINIC_OR_DEPARTMENT_OTHER): Payer: 59 | Admitting: Hematology & Oncology

## 2013-05-02 VITALS — BP 128/80 | HR 71 | Temp 97.4°F | Resp 16 | Ht 65.0 in | Wt 162.0 lb

## 2013-05-02 DIAGNOSIS — C186 Malignant neoplasm of descending colon: Secondary | ICD-10-CM

## 2013-05-02 DIAGNOSIS — C187 Malignant neoplasm of sigmoid colon: Secondary | ICD-10-CM

## 2013-05-02 DIAGNOSIS — Z86718 Personal history of other venous thrombosis and embolism: Secondary | ICD-10-CM

## 2013-05-02 DIAGNOSIS — I2699 Other pulmonary embolism without acute cor pulmonale: Secondary | ICD-10-CM

## 2013-05-02 LAB — CBC WITH DIFFERENTIAL (CANCER CENTER ONLY)
BASO%: 0.3 % (ref 0.0–2.0)
EOS%: 1.7 % (ref 0.0–7.0)
LYMPH%: 33.7 % (ref 14.0–48.0)
MCH: 32.1 pg (ref 26.0–34.0)
MCV: 92 fL (ref 81–101)
MONO%: 6.1 % (ref 0.0–13.0)
Platelets: 135 10*3/uL — ABNORMAL LOW (ref 145–400)
RDW: 12.4 % (ref 11.1–15.7)

## 2013-05-02 LAB — COMPREHENSIVE METABOLIC PANEL
ALT: 9 U/L (ref 0–35)
AST: 14 U/L (ref 0–37)
CO2: 27 mEq/L (ref 19–32)
Creatinine, Ser: 0.8 mg/dL (ref 0.50–1.10)
Total Bilirubin: 0.7 mg/dL (ref 0.3–1.2)

## 2013-05-02 LAB — CEA: CEA: 3.2 ng/mL (ref 0.0–5.0)

## 2013-05-02 LAB — D-DIMER, QUANTITATIVE: D-Dimer, Quant: 0.27 ug/mL-FEU (ref 0.00–0.48)

## 2013-05-02 NOTE — Progress Notes (Signed)
This office note has been dictated.

## 2013-05-03 ENCOUNTER — Telehealth: Payer: Self-pay | Admitting: Hematology & Oncology

## 2013-05-03 NOTE — Telephone Encounter (Addendum)
Message copied by Cathi Roan on Tue May 03, 2013  2:46 PM ------      Message from: Josph Macho      Created: Tue May 03, 2013  7:26 AM       Please call and tell her that her labs look fantastic. Thanks. [name] ------ 5-13-2:45 called patient at home and left message regarding above MD message, and to call us back if any questions. Lupita Raider LPN

## 2013-05-03 NOTE — Progress Notes (Signed)
CC:   Bryan Lemma. Manus Gunning, M.D. Adolph Pollack, M.D.  DIAGNOSES: 1. 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 2. Deep venous thrombosis, postoperative.  CURRENT THERAPY:  The patient to start aspirin 162 mg p.o. daily.  INTERIM HISTORY:  [name] comes in for her followup.  She had her colostomy reversed.  She went through this without any difficulties.  I think this was done back in early 03-09-2023.  Unfortunately her husband passed away in 2023-03-09.  He had metastatic colon cancer.  He was at [facility].  She is relieved in some ways because he was suffering.  Otherwise, [name] is doing okay.  She is back to work.  She is having no problems with fever, sweats or chills.  She has had no abdominal pain.  She has had no leg swelling.  She has had no cough or shortness of breath.  Her last CEA was 3.3 in January.  We did do some followup scans on her.  They were done on May 5.  The scans did not show any evidence of recurrent colon cancer.  PHYSICAL EXAMINATION:  General:  This is a well-developed, well- nourished African American female in no obvious distress.  Vital signs: Show a temperature of 97.4, pulse 71, respiratory rate 16, blood pressure 128/80.  Weight is 162.  Head and neck:  Show a normocephalic, atraumatic skull.  There are no ocular or oral lesions.  There are no palpable cervical or supraclavicular lymph nodes.  Lungs:  Clear bilaterally.  Cardiac:  Regular rate and rhythm with a normal S1, S2. There are no murmurs, rubs or bruits.  Abdomen:  Soft with good bowel sounds.  There is no palpable abdominal mass.  There is no fluid wave. There is no palpable hepatosplenomegaly.  She has a well-healed laparotomy scar.  Extremities:  Show no clubbing, cyanosis or edema. She has good range of motion of her joints.  Neurological:  Shows no focal neurological deficits.  LABORATORY STUDIES:  White cell count is 2.9, hemoglobin  12.5, hematocrit 35.8, platelet count 135.  IMPRESSION:  [name] is a very charming 57 year old African American female with stage II adenocarcinoma of the sigmoid colon.  She underwent resection back in January of 2013.  She had 36 lymph nodes that were negative.  I actually did an Oncotype score on her.  This was 20.  I do not feel that she is going to benefit from adjuvant chemotherapy given the number of lymph nodes that were taken out, negative margins and the Oncotype score.  We still need to follow her with CT scans every 4 months for right now.  I told her to get on 162 mg of aspirin a day.  I really believe that this will help with respect to decrease the risk of recurrence of the DVT.  She has gone through all the surgeries so aspirin would be okay for her right now.  I will plan to get her back after Labor Day now.  We will do her scans after Labor Day and I will see her back about a week after her scans are done.    ______________________________ Mallory Morales, M.D. PRE/MEDQ  D:  05/02/2013  T:  05/03/2013  Job:  [id number]

## 2013-05-19 ENCOUNTER — Encounter (INDEPENDENT_AMBULATORY_CARE_PROVIDER_SITE_OTHER): Payer: Self-pay | Admitting: General Surgery

## 2013-05-19 ENCOUNTER — Ambulatory Visit (INDEPENDENT_AMBULATORY_CARE_PROVIDER_SITE_OTHER): Payer: 59 | Admitting: General Surgery

## 2013-05-19 VITALS — BP 132/80 | HR 83 | Temp 98.4°F | Resp 18 | Ht 65.0 in | Wt 165.2 lb

## 2013-05-19 DIAGNOSIS — Z9889 Other specified postprocedural states: Secondary | ICD-10-CM

## 2013-05-19 NOTE — Progress Notes (Signed)
Procedure:  Laparoscopic assisted colostomy closure  Date:  02/24/2013  Pathology:  Benign  History:  She is here for another postoperative visit.  She has been having some burning anal pain and bright red blood per rectum only after eating spicy foods. She is having 2 soft bowel movements a day.  She has been working up 11 hours per day her body is not able to tolerate this.  .Exam: General- Is in NAD. Abdomen-soft, midline incision is clean and intact, left-sided colostomy site is healed.  Assessment: Wounds have healed well. Still having some bright red blood per rectum but only with spicy foods. I suspect this is due to some internal hemorrhoidal disease as her colonoscopy did not demonstrate any pathology. That was done 11 months ago.  Plan:   I've told her she needs to avoid spicy foods for the next 2 months. If she is still having rectal bleeding I told her she needs to call us. I think she can work 8-9 hours a day but certainly working more than that is probably more that she can tolerate given all she has been through.  Return visit when necessary.

## 2013-05-19 NOTE — Patient Instructions (Signed)
Avoid spicy foods for the evaluated the a month or 2. If you are still having rectal bleeding after that time, please call.  Activities as tolerated.

## 2013-08-31 ENCOUNTER — Encounter (HOSPITAL_BASED_OUTPATIENT_CLINIC_OR_DEPARTMENT_OTHER): Payer: Self-pay

## 2013-08-31 ENCOUNTER — Ambulatory Visit (HOSPITAL_BASED_OUTPATIENT_CLINIC_OR_DEPARTMENT_OTHER)
Admission: RE | Admit: 2013-08-31 | Discharge: 2013-08-31 | Disposition: A | Discharge status: Home / Self Care | Payer: 59 | Source: Ambulatory Visit | Attending: Hematology & Oncology | Admitting: Hematology & Oncology

## 2013-08-31 DIAGNOSIS — C186 Malignant neoplasm of descending colon: Secondary | ICD-10-CM

## 2013-08-31 DIAGNOSIS — Z9049 Acquired absence of other specified parts of digestive tract: Secondary | ICD-10-CM | POA: Insufficient documentation

## 2013-08-31 DIAGNOSIS — R918 Other nonspecific abnormal finding of lung field: Secondary | ICD-10-CM | POA: Insufficient documentation

## 2013-08-31 DIAGNOSIS — M5137 Other intervertebral disc degeneration, lumbosacral region: Secondary | ICD-10-CM | POA: Insufficient documentation

## 2013-08-31 DIAGNOSIS — Z90710 Acquired absence of both cervix and uterus: Secondary | ICD-10-CM | POA: Insufficient documentation

## 2013-08-31 DIAGNOSIS — M51379 Other intervertebral disc degeneration, lumbosacral region without mention of lumbar back pain or lower extremity pain: Secondary | ICD-10-CM | POA: Insufficient documentation

## 2013-08-31 DIAGNOSIS — Z85038 Personal history of other malignant neoplasm of large intestine: Secondary | ICD-10-CM | POA: Insufficient documentation

## 2013-08-31 MED ORDER — IOHEXOL 300 MG/ML  SOLN
100.0000 mL | Freq: Once | INTRAMUSCULAR | Status: AC | PRN
  Administered 2013-08-31: 100 mL via INTRAVENOUS

## 2013-09-02 ENCOUNTER — Telehealth: Payer: Self-pay | Admitting: *Deleted

## 2013-09-02 NOTE — Telephone Encounter (Signed)
Called patient to let her know that there is no colon cancer per dr. Myna Hidalgo on her cT scan

## 2013-09-02 NOTE — Telephone Encounter (Signed)
Message copied by Anselm Jungling on Fri Sep 02, 2013 10:50 AM ------      Message from: Arlan Organ R      Created: Wed Aug 31, 2013  3:44 PM       Call - no colon cancer!!!!   Cindee Lame ------

## 2013-09-07 ENCOUNTER — Ambulatory Visit (HOSPITAL_BASED_OUTPATIENT_CLINIC_OR_DEPARTMENT_OTHER): Payer: 59 | Admitting: Hematology & Oncology

## 2013-09-07 ENCOUNTER — Other Ambulatory Visit (HOSPITAL_BASED_OUTPATIENT_CLINIC_OR_DEPARTMENT_OTHER): Payer: 59 | Admitting: Lab

## 2013-09-07 VITALS — BP 126/73 | HR 75 | Temp 97.9°F | Resp 16 | Ht 65.0 in | Wt 169.0 lb

## 2013-09-07 DIAGNOSIS — I2699 Other pulmonary embolism without acute cor pulmonale: Secondary | ICD-10-CM

## 2013-09-07 DIAGNOSIS — C187 Malignant neoplasm of sigmoid colon: Secondary | ICD-10-CM

## 2013-09-07 DIAGNOSIS — Z86718 Personal history of other venous thrombosis and embolism: Secondary | ICD-10-CM

## 2013-09-07 DIAGNOSIS — C186 Malignant neoplasm of descending colon: Secondary | ICD-10-CM

## 2013-09-07 LAB — LACTATE DEHYDROGENASE: LDH: 193 U/L (ref 94–250)

## 2013-09-07 LAB — CBC WITH DIFFERENTIAL (CANCER CENTER ONLY)
BASO#: 0 10*3/uL (ref 0.0–0.2)
EOS%: 2.7 % (ref 0.0–7.0)
HCT: 31 % — ABNORMAL LOW (ref 34.8–46.6)
HGB: 10.2 g/dL — ABNORMAL LOW (ref 11.6–15.9)
LYMPH#: 1.1 10*3/uL (ref 0.9–3.3)
MCHC: 32.9 g/dL (ref 32.0–36.0)
NEUT%: 52.8 % (ref 39.6–80.0)

## 2013-09-07 LAB — COMPREHENSIVE METABOLIC PANEL
Albumin: 4.3 g/dL (ref 3.5–5.2)
Alkaline Phosphatase: 56 U/L (ref 39–117)
BUN: 13 mg/dL (ref 6–23)
Calcium: 9.2 mg/dL (ref 8.4–10.5)
Chloride: 104 mEq/L (ref 96–112)
Glucose, Bld: 96 mg/dL (ref 70–99)
Potassium: 4.1 mEq/L (ref 3.5–5.3)

## 2013-09-07 LAB — D-DIMER, QUANTITATIVE: D-Dimer, Quant: 0.27 ug/mL-FEU (ref 0.00–0.48)

## 2013-09-07 NOTE — Progress Notes (Signed)
This office note has been dictated.

## 2013-09-15 ENCOUNTER — Encounter (INDEPENDENT_AMBULATORY_CARE_PROVIDER_SITE_OTHER): Payer: Self-pay | Admitting: General Surgery

## 2013-09-15 ENCOUNTER — Telehealth (INDEPENDENT_AMBULATORY_CARE_PROVIDER_SITE_OTHER): Payer: Self-pay | Admitting: General Surgery

## 2013-09-15 NOTE — Telephone Encounter (Signed)
Pt called to request letter from surgeon to okay her taking a Zumba class, beginning next week.  She had surgery for colostomy closure on 02/24/13.  Please advise.

## 2013-09-15 NOTE — Telephone Encounter (Signed)
Per OK by Dr. Abbey Chatters, letter written and mailed to patient.

## 2013-09-15 NOTE — Telephone Encounter (Signed)
She may have a letter to attend the class.

## 2013-09-20 NOTE — Progress Notes (Signed)
CC:   Adolph Pollack, M.D. Bryan Lemma. Manus Gunning, M.D.  DIAGNOSES: 1. 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 2. Postoperative deep venous thrombosis.  CURRENT THERAPY:  Observation.  INTERIM HISTORY:  Ms. Heitman comes in for followup.  She is doing fairly well.  Again, her husband passed away in Mar 14, 2023.  She is managing to get through this.  She is working.  She is feeling a little bit better every day.  She has had no problems with bowels or bladder.  She is going much more regularly now.  We did go ahead and repeat a CT scan on her.  I went ahead and reviewed this with her.  The CT scan was done on September 10th.  The CT scan did not show any evidence of recurrent disease.  She has had no cough.  She has had no bleeding.  There has been no leg swelling.  PHYSICAL EXAMINATION:  General:  This is a well developed, well nourished [place] American female in no obvious distress.  Vital Signs: Temperature of 97.9, pulse 75, respiratory rate 16, blood pressure 126/73.  Weight is 169.  Head and Neck:  Normocephalic, atraumatic skull.  There are no ocular or oral lesions.  There are no palpable cervical or supraclavicular lymph nodes.  Lungs:  Clear bilaterally. Cardiac:  Regular rate and rhythm with a normal S1 and S2.  There are no murmurs, rubs or bruits.  Abdomen:  Soft.  She has good bowel sounds. She has a well-healed laparotomy scar.  There is no fluid wave.  She may have a little bit of a keloid with the laparotomy scar.  She has no palpable hepatosplenomegaly.  Extremities:  Show no clubbing, cyanosis or edema.  She has good range motion of her joints.  She has good strength in his legs.  Back:  No  tenderness over the spine, ribs, or hips.  Neurological:  No focal neurological deficits.  LABORATORY STUDIES:  White cell count 3, hemoglobin 10.2, hematocrit 31.0, platelet count 160.  MCV is 87.  On her peripheral smear, I saw some microcytic and hypochromic  red cells.  She has no spherocytes.  She has no schistocytes.  White cells appear normal in morphology and maturation.  Her white cells are a little bit decreased, but again appear normal.  Platelets are adequate in number and size.  IMPRESSION:  Ms. Schlotterbeck is a 57 year old African American female with stage II adenocarcinoma of the sigmoid colon.  She was found to have low risk disease.  Her Oncotype score was 20.  She had 36 lymph nodes that were all negative.  PLAN:  So far, I do not see any evidence of recurrence.  We will go ahead and plan to get her back in 4 months.  We will still get a CT scan on her in 4 months.  When we see her back, if all looks good, then we can go every 6 months.    ______________________________ Josph Macho, M.D. PRE/MEDQ  D:  09/07/2013  T:  09/20/2013  Job:  [id number]

## 2013-10-27 ENCOUNTER — Other Ambulatory Visit: Payer: Self-pay

## 2013-11-28 NOTE — Progress Notes (Signed)
CC:   Bryan Lemma. Manus Gunning, M.D. Adolph Pollack, M.D.  DIAGNOSES: 1. 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 2. Postoperative deep venous thrombosis.  CURRENT THERAPY:  Observation.  INTERIM HISTORY:  Ms. Lowdermilk comes in for followup.  She is doing fairly well.  Again, her husband passed away in Mar 31, 2023.  She is managing to get through this.  She is working.  She is feeling a little better every day.  She has had no problems with bowels or bladder.  She is going much more regularly now.  We did go ahead and repeat a CT scan on her.  I went ahead and reviewed this with her.  CT scan was done on September 10th.  The CT scan did not show any evidence of recurrent disease.  She has had no cough.  She has had no bleeding.  There has been no leg swelling.  PHYSICAL EXAMINATION:  General:  This is a well developed, well nourished [place] American female, in no obvious distress.  Vital Signs: Temperature 97.9, pulse 75, respiratory rate 16, blood pressure 126/73, weight is 169.  Head and Neck:  Normocephalic, atraumatic skull.  There are no ocular or oral lesions.  There are no palpable cervical or supraclavicular lymph nodes.  Lungs:  Clear bilaterally.  Cardiac: Regular rate and rhythm with a normal S1 and S2.  There are no murmurs, rubs, or bruits.  Abdomen:  Soft.  She has good bowel sounds.  She has well-healed laparotomy scar.  There is no fluid wave.  She may have a little bit of keloid with the laparotomy scar.  She has no palpable hepatosplenomegaly.  Extremities:  Show no clubbing, cyanosis, or edema. She has good range of motion of her joints.  She has good strength in her legs.  Back:  No tenderness over the spine, ribs, or hips. Neurological:  No focal neurological deficits.  LABORATORY STUDIES:  White cell count is 3, hemoglobin 10.2, hematocrit 31.0, platelet count 163.  MCV is 87.  On peripheral smear, I saw some microcytic and hypochromic red  cells. She has no spherocytes.  She has no schistocytes.  White cells appear normal in morphology maturation.  Her white cells were a little bit decreased, but again appeared normal.  Platelets are adequate in number and size.  IMPRESSION:  Ms. Belcourt is a 57 year old African American female with stage II adenocarcinoma of the sigmoid colon.  She was found to have a low risk disease.  Her Oncotype score was 20.  She had 36 lymph nodes that were all negative.  So far, I do not see any evidence of recurrence.  We will go ahead and plan to get her back in 4 months.  We will still get a CT scan on her in 4 months.  When we see her back, if all looks good, then we can go every 6 months.    ______________________________ Josph Macho, M.D. PRE/MEDQ  D:  09/07/2013  T:  11/27/2013  Job:  [id number]

## 2013-12-29 ENCOUNTER — Ambulatory Visit (HOSPITAL_BASED_OUTPATIENT_CLINIC_OR_DEPARTMENT_OTHER)
Admission: RE | Admit: 2013-12-29 | Discharge: 2013-12-29 | Disposition: A | Discharge status: Home / Self Care | Payer: 59 | Source: Ambulatory Visit | Attending: Hematology & Oncology | Admitting: Hematology & Oncology

## 2013-12-29 ENCOUNTER — Other Ambulatory Visit (HOSPITAL_BASED_OUTPATIENT_CLINIC_OR_DEPARTMENT_OTHER): Payer: 59

## 2013-12-29 DIAGNOSIS — Z90710 Acquired absence of both cervix and uterus: Secondary | ICD-10-CM | POA: Insufficient documentation

## 2013-12-29 DIAGNOSIS — C186 Malignant neoplasm of descending colon: Secondary | ICD-10-CM

## 2013-12-29 DIAGNOSIS — C189 Malignant neoplasm of colon, unspecified: Secondary | ICD-10-CM | POA: Insufficient documentation

## 2013-12-29 DIAGNOSIS — Z9049 Acquired absence of other specified parts of digestive tract: Secondary | ICD-10-CM | POA: Insufficient documentation

## 2013-12-29 DIAGNOSIS — J984 Other disorders of lung: Secondary | ICD-10-CM | POA: Insufficient documentation

## 2013-12-29 MED ORDER — IOHEXOL 300 MG/ML  SOLN
100.0000 mL | Freq: Once | INTRAMUSCULAR | Status: AC | PRN
  Administered 2013-12-29: 100 mL via INTRAVENOUS

## 2013-12-30 ENCOUNTER — Telehealth: Payer: Self-pay | Admitting: *Deleted

## 2013-12-30 NOTE — Telephone Encounter (Signed)
Called patient to let her know that her CT scan was normal per dr Marin Olp.  No evidence of recurrent cancer.

## 2013-12-30 NOTE — Telephone Encounter (Signed)
Message copied by Rico Ala on Fri Dec 30, 2013  9:56 AM ------      Message from: Burney Gauze R      Created: Thu Dec 29, 2013  6:28 PM       Call and let her know that her CAT scans are normal.. There is no evidence of recurrent cancer. Thanks. [name] ------

## 2014-01-12 ENCOUNTER — Ambulatory Visit (HOSPITAL_BASED_OUTPATIENT_CLINIC_OR_DEPARTMENT_OTHER): Payer: 59

## 2014-01-12 ENCOUNTER — Encounter: Payer: Self-pay | Admitting: *Deleted

## 2014-01-12 ENCOUNTER — Ambulatory Visit (HOSPITAL_BASED_OUTPATIENT_CLINIC_OR_DEPARTMENT_OTHER): Payer: 59 | Admitting: Hematology & Oncology

## 2014-01-12 ENCOUNTER — Encounter: Payer: Self-pay | Admitting: Hematology & Oncology

## 2014-01-12 ENCOUNTER — Other Ambulatory Visit (HOSPITAL_BASED_OUTPATIENT_CLINIC_OR_DEPARTMENT_OTHER): Payer: 59 | Admitting: Lab

## 2014-01-12 VITALS — BP 130/78 | HR 89 | Temp 98.0°F | Resp 14 | Ht 68.0 in | Wt 172.0 lb

## 2014-01-12 DIAGNOSIS — C187 Malignant neoplasm of sigmoid colon: Secondary | ICD-10-CM

## 2014-01-12 DIAGNOSIS — Z86718 Personal history of other venous thrombosis and embolism: Secondary | ICD-10-CM

## 2014-01-12 DIAGNOSIS — D509 Iron deficiency anemia, unspecified: Secondary | ICD-10-CM

## 2014-01-12 DIAGNOSIS — C186 Malignant neoplasm of descending colon: Secondary | ICD-10-CM

## 2014-01-12 LAB — IRON AND TIBC CHCC
%SAT: 5 % — ABNORMAL LOW (ref 21–57)
Iron: 24 ug/dL — ABNORMAL LOW (ref 41–142)
TIBC: 505 ug/dL — AB (ref 236–444)
UIBC: 481 ug/dL — ABNORMAL HIGH (ref 120–384)

## 2014-01-12 LAB — CBC WITH DIFFERENTIAL (CANCER CENTER ONLY)
BASO#: 0 10*3/uL (ref 0.0–0.2)
BASO%: 1 % (ref 0.0–2.0)
EOS ABS: 0.1 10*3/uL (ref 0.0–0.5)
EOS%: 2 % (ref 0.0–7.0)
HEMATOCRIT: 28.7 % — AB (ref 34.8–46.6)
HEMOGLOBIN: 8.8 g/dL — AB (ref 11.6–15.9)
LYMPH#: 0.9 10*3/uL (ref 0.9–3.3)
LYMPH%: 30.8 % (ref 14.0–48.0)
MCH: 23.4 pg — AB (ref 26.0–34.0)
MCHC: 30.7 g/dL — ABNORMAL LOW (ref 32.0–36.0)
MCV: 76 fL — AB (ref 81–101)
MONO#: 0.2 10*3/uL (ref 0.1–0.9)
MONO%: 6.1 % (ref 0.0–13.0)
NEUT#: 1.8 10*3/uL (ref 1.5–6.5)
NEUT%: 60.1 % (ref 39.6–80.0)
Platelets: 181 10*3/uL (ref 145–400)
RBC: 3.76 10*6/uL (ref 3.70–5.32)
RDW: 15.3 % (ref 11.1–15.7)
WBC: 3 10*3/uL — AB (ref 3.9–10.0)

## 2014-01-12 LAB — COMPREHENSIVE METABOLIC PANEL
ALT: 13 U/L (ref 0–35)
AST: 21 U/L (ref 0–37)
Albumin: 4.2 g/dL (ref 3.5–5.2)
Alkaline Phosphatase: 55 U/L (ref 39–117)
BILIRUBIN TOTAL: 0.7 mg/dL (ref 0.3–1.2)
BUN: 11 mg/dL (ref 6–23)
CO2: 25 meq/L (ref 19–32)
Calcium: 9 mg/dL (ref 8.4–10.5)
Chloride: 104 mEq/L (ref 96–112)
Creatinine, Ser: 0.77 mg/dL (ref 0.50–1.10)
GLUCOSE: 95 mg/dL (ref 70–99)
Potassium: 4.1 mEq/L (ref 3.5–5.3)
SODIUM: 142 meq/L (ref 135–145)
TOTAL PROTEIN: 6.6 g/dL (ref 6.0–8.3)

## 2014-01-12 LAB — CEA: CEA: 3.6 ng/mL (ref 0.0–5.0)

## 2014-01-12 LAB — FERRITIN CHCC: Ferritin: 12 ng/ml (ref 9–269)

## 2014-01-12 LAB — LACTATE DEHYDROGENASE: LDH: 194 U/L (ref 94–250)

## 2014-01-12 MED ORDER — SODIUM CHLORIDE 0.9 % IV SOLN
1020.0000 mg | Freq: Once | INTRAVENOUS | Status: AC
  Administered 2014-01-12: 1020 mg via INTRAVENOUS
  Filled 2014-01-12: qty 34

## 2014-01-12 MED ORDER — SODIUM CHLORIDE 0.9 % IV SOLN
Freq: Once | INTRAVENOUS | Status: AC
  Administered 2014-01-12: 10:00:00 via INTRAVENOUS

## 2014-01-12 NOTE — Progress Notes (Signed)
This office note has been dictated.

## 2014-01-12 NOTE — Patient Instructions (Signed)

## 2014-01-13 NOTE — Progress Notes (Signed)
CC:   Odis Hollingshead, M.D. Modena Jansky. [name], M.D.  DIAGNOSES: 1. Stage II adenocarcinoma of the sigmoid colon. 2. Postoperative deep venous thrombosis. 3. Recurrent iron-deficiency anemia.  CURRENT THERAPY:  Patient to receive IV iron today.  INTERIM HISTORY:  Mallory Morales comes in for followup.  We last saw her back in September.  We did go ahead and repeat her scans.  The CT scan that was done showed no evidence of recurrent disease.  This was done on December 29, 2013.  She has a few lung nodule which were stable.  They were noted 2 years ago, not changed.  There is nothing within the abdomen or pelvis that looked unusual.  She has some occasional bright red blood per rectum.  Again, she says this is not all that frequent.  Last time we checked her iron studies, it was probably a year and a half ago.  However, she was noted to have a hemoglobin of 8.8 today.  Her MCV is down and I suspect that she probably does have some iron deficiency again.  I do not see any medicines that she is on that really could cause some malabsorption of iron.  She has had no mouth sores.  There has been no weight loss or weight gain.  She has had no rashes.  She has had no cough or shortness of breath.  There has been no fever.  Her last colonoscopy was a year ago.  PHYSICAL EXAMINATION:  General:  This is a well-developed, well- nourished African-American female in no obvious distress.  Vital Signs: Temperature 98, pulse 89, respiratory rate 14, blood pressure 130/78. Weight is 172 pounds.  Head and Neck:  Normocephalic, atraumatic skull. She has no ocular or oral lesions.  There are no palpable cervical or supraclavicular lymph nodes.  Lungs:  Clear bilaterally.  Cardiac: Regular rate and rhythm with a normal S1, S2.  There are no murmurs, rubs, or bruits.  Abdomen:  Soft.  She has good bowel sounds.  She has a well-healed laparotomy scar.  There is a little bit of a keloid with  the laparotomy site.  She has no fluid wave.  There is no palpable hepatosplenomegaly.  Extremities:  No clubbing, cyanosis, or edema. Neurological:  No focal neurological deficits.  Skin:  No rashes, ecchymosis, or petechia.  LABORATORY STUDIES:  White cell count is 3.  Hemoglobin 8.8, hematocrit 28.7, platelet count is 181.  MCV is 76.  Peripheral smear shows some mild anisocytosis and poikilocytosis.  There is no nucleated red blood cells.  I see no target cells, appears no rouleaux formation.  White cells appear normal in  morphology and maturation.  There is no hypersegmented polys.  Platelets are adequate in number and size.  IMPRESSION:  Mallory Morales is very charming 58 year old African-American female.  She presented with stage II adenocarcinoma of the colon.  She had an Oncotype score of 20.  She had 36 lymph nodes that were all negative.  She was a low risk for recurrence, so we do not give her any adjuvant chemotherapy.  I am not sure as to why she has the iron deficiency right now.  I gave her stool cards to take home with her.  I suspect that we may have to do another endoscopic evaluation.  The patient has never had an upper endoscopy.  We may need to get GI involved with this.  It has now been 2 years since she was initially diagnosed.  I  think her risk of recurrence is going to be less than 10%.  I want to see her back in 6 weeks, so that we can follow up with the anemia.    ______________________________ [name], M.D. PRE/MEDQ  D:  01/12/2014  T:  01/13/2014  Job:  [id number]

## 2014-01-26 ENCOUNTER — Other Ambulatory Visit: Payer: Self-pay | Admitting: Nurse Practitioner

## 2014-01-26 ENCOUNTER — Other Ambulatory Visit (HOSPITAL_BASED_OUTPATIENT_CLINIC_OR_DEPARTMENT_OTHER): Payer: 59 | Admitting: Lab

## 2014-01-26 DIAGNOSIS — C187 Malignant neoplasm of sigmoid colon: Secondary | ICD-10-CM

## 2014-01-26 DIAGNOSIS — C186 Malignant neoplasm of descending colon: Secondary | ICD-10-CM

## 2014-01-26 DIAGNOSIS — D509 Iron deficiency anemia, unspecified: Secondary | ICD-10-CM

## 2014-01-26 LAB — FECAL OCCULT BLOOD, GUIAC - CHCC SATELLITE: Occult Blood: POSITIVE

## 2014-02-02 ENCOUNTER — Telehealth: Payer: Self-pay | Admitting: *Deleted

## 2014-02-02 NOTE — Telephone Encounter (Signed)
Patient called stating that she wants Dr. Benson Norway to do her Colonoscopy.  Spoke with Dr. Marin Olp who states he knew this because Dr. Cristina Gong let him know. He will call Dr. Benson Norway

## 2014-02-23 ENCOUNTER — Ambulatory Visit (HOSPITAL_BASED_OUTPATIENT_CLINIC_OR_DEPARTMENT_OTHER): Payer: 59 | Admitting: Hematology & Oncology

## 2014-02-23 ENCOUNTER — Encounter: Payer: Self-pay | Admitting: Hematology & Oncology

## 2014-02-23 ENCOUNTER — Other Ambulatory Visit (HOSPITAL_BASED_OUTPATIENT_CLINIC_OR_DEPARTMENT_OTHER): Payer: 59 | Admitting: Lab

## 2014-02-23 VITALS — BP 137/85 | HR 75 | Temp 98.5°F | Resp 14 | Ht 67.0 in | Wt 171.0 lb

## 2014-02-23 DIAGNOSIS — D500 Iron deficiency anemia secondary to blood loss (chronic): Secondary | ICD-10-CM

## 2014-02-23 DIAGNOSIS — I2699 Other pulmonary embolism without acute cor pulmonale: Secondary | ICD-10-CM

## 2014-02-23 DIAGNOSIS — K922 Gastrointestinal hemorrhage, unspecified: Secondary | ICD-10-CM

## 2014-02-23 DIAGNOSIS — C186 Malignant neoplasm of descending colon: Secondary | ICD-10-CM

## 2014-02-23 LAB — CBC WITH DIFFERENTIAL (CANCER CENTER ONLY)
BASO#: 0 10*3/uL (ref 0.0–0.2)
BASO%: 0.3 % (ref 0.0–2.0)
EOS%: 1.5 % (ref 0.0–7.0)
Eosinophils Absolute: 0.1 10*3/uL (ref 0.0–0.5)
HCT: 36.4 % (ref 34.8–46.6)
HGB: 12.4 g/dL (ref 11.6–15.9)
LYMPH#: 1.4 10*3/uL (ref 0.9–3.3)
LYMPH%: 41.2 % (ref 14.0–48.0)
MCH: 27.6 pg (ref 26.0–34.0)
MCHC: 34.1 g/dL (ref 32.0–36.0)
MCV: 81 fL (ref 81–101)
MONO#: 0.2 10*3/uL (ref 0.1–0.9)
MONO%: 6.7 % (ref 0.0–13.0)
NEUT#: 1.7 10*3/uL (ref 1.5–6.5)
NEUT%: 50.3 % (ref 39.6–80.0)
PLATELETS: 142 10*3/uL — AB (ref 145–400)
RBC: 4.49 10*6/uL (ref 3.70–5.32)
RDW: 20.7 % — AB (ref 11.1–15.7)
WBC: 3.4 10*3/uL — AB (ref 3.9–10.0)

## 2014-02-23 LAB — IRON AND TIBC CHCC
%SAT: 15 % — ABNORMAL LOW (ref 21–57)
IRON: 54 ug/dL (ref 41–142)
TIBC: 361 ug/dL (ref 236–444)
UIBC: 308 ug/dL (ref 120–384)

## 2014-02-23 LAB — FERRITIN CHCC: Ferritin: 211 ng/ml (ref 9–269)

## 2014-02-23 LAB — CHCC SATELLITE - SMEAR

## 2014-02-23 NOTE — Progress Notes (Signed)
  DIAGNOSIS:  Stage II adenocarcinoma of the sigmoid colon  GI blood loss secondary to bleeding at the anastomosis site  Iron deficiency-replace with IV iron  History of postoperative DVT   CURRENT THERAPY:  IV iron with [facility] January 22nd, 2015   INTERIM HISTORY:  Mallory Morales comes in for followup. We sent her to gastroenterology. Dr. Benson Norway did endoscopy on her. There is no cancer. She had bleeding at the anastomosis site from her colon surgery. He will speak to her surgeon about this. Again no cancer was found.    She feels better. He gave her iron and this helped her out quite a bit.    She has had no abdominal pain. There is no nausea vomiting. Is no cough. She's had no leg swelling. She's had no fevers sweats or chills.   PHYSICAL EXAMINATION: Well-developed well-nourished African American female in no obvious distress. Vital signs temperature 98.2. Pulse 75. Blood pressure 137 over HR. Weight 171. Abdomen is soft. She is able bit of a keloid with her laparotomy. There is no fluid wave. There is no palpable abdominal mass. There is no palpable hepato- splenomegaly. Lungs are clear. Cardiac exam regular rate and rhythm. Extremities no clubbing cyanosis or edema. Lymph nodes are negative. Skin exam no rashes or ecchymosis.  LABORATORY STUDIES:  White cell count is 3.4. Hemoglobin 12.4. Platelet count 142. MCV is 81.   IMPRESSION:  Mallory Morales is a 58 year old African American female with history of stage II colon cancer. This was resected back in 2013. She had an Oncotype score of 20. She had negative lymph nodes and 36 lymph nodes.    We do not have any evidence of recurrent cancer. Again her much her why she would have some bleeding at the anastomosis site. We will add gastroenterology and surgery and the list if necessary.    We will see what her iron stores are. For him to be a still a little over. She responded well but she still may be on the low  side.    We will check her blood counts and iron stores every 2 months for right now.    I will still plan to see her back in 6 months.   [name], MD 02/23/2014

## 2014-02-27 LAB — RETICULOCYTES (CHCC)
ABS Retic: 46.4 10*3/uL (ref 19.0–186.0)
RBC.: 4.64 MIL/uL (ref 3.87–5.11)
RETIC CT PCT: 1 % (ref 0.4–2.3)

## 2014-02-27 LAB — HEMOGLOBINOPATHY EVALUATION
HEMOGLOBIN OTHER: 0 %
HGB A2 QUANT: 2.4 % (ref 2.2–3.2)
HGB A: 97 % (ref 96.8–97.8)
Hgb F Quant: 0.6 % (ref 0.0–2.0)
Hgb S Quant: 0 %

## 2014-03-01 ENCOUNTER — Telehealth: Payer: Self-pay | Admitting: Nurse Practitioner

## 2014-03-01 ENCOUNTER — Other Ambulatory Visit: Payer: Self-pay | Admitting: Nurse Practitioner

## 2014-03-01 DIAGNOSIS — D509 Iron deficiency anemia, unspecified: Secondary | ICD-10-CM

## 2014-03-01 NOTE — Telephone Encounter (Addendum)
Message copied by Jimmy Footman on Wed Mar 01, 2014 11:02 AM ------      Message from: Burney Gauze R      Created: Thu Feb 23, 2014  5:43 PM       Call - iron is still a little low!! Need 1 more dose of Fereheme-1020mg .  Plese set up. pete ------Pt verbalized understanding and appreciation. Will have Rick schedule her for iron on 3/19 @ 0900.

## 2014-03-07 ENCOUNTER — Ambulatory Visit (HOSPITAL_BASED_OUTPATIENT_CLINIC_OR_DEPARTMENT_OTHER): Payer: 59

## 2014-03-07 VITALS — BP 151/88 | HR 81 | Temp 99.1°F | Resp 20

## 2014-03-07 DIAGNOSIS — D509 Iron deficiency anemia, unspecified: Secondary | ICD-10-CM

## 2014-03-07 MED ORDER — SODIUM CHLORIDE 0.9 % IV SOLN
Freq: Once | INTRAVENOUS | Status: AC
  Administered 2014-03-07: 10:00:00 via INTRAVENOUS

## 2014-03-07 MED ORDER — SODIUM CHLORIDE 0.9 % IV SOLN
1020.0000 mg | Freq: Once | INTRAVENOUS | Status: AC
Start: 2014-03-09 — End: 2014-03-07
  Administered 2014-03-07: 1020 mg via INTRAVENOUS
  Filled 2014-03-07: qty 34

## 2014-03-07 NOTE — Patient Instructions (Signed)

## 2014-03-09 ENCOUNTER — Ambulatory Visit: Payer: 59

## 2014-03-13 ENCOUNTER — Telehealth (INDEPENDENT_AMBULATORY_CARE_PROVIDER_SITE_OTHER): Payer: Self-pay

## 2014-03-13 NOTE — Telephone Encounter (Signed)
Dr Benson Norway can manage this.

## 2014-03-13 NOTE — Telephone Encounter (Signed)
Patient states she still can see blood in her stool and did see Dr. Benson Norway and he ordered EGD , she asking should she see DR. Rosenbower  For further testing.

## 2014-03-13 NOTE — Telephone Encounter (Signed)
I spoke with Dr. Benson Norway. We decided to try colonoscopic treatment. He will discuss this with her as will I.

## 2014-03-13 NOTE — Telephone Encounter (Signed)
Patient states the blood is coming from inside  Incision per [facility] and she needs to be  Seen by  Dr. Zella Richer  . Please call patient

## 2014-03-14 NOTE — Telephone Encounter (Signed)
I spoke with her this afternoon.  The plan is to perform colonoscopic treatment of the inflammatory tissue on the previous anastomotic site.  Once that has been done, if she has hemorrhoids, these could be addressed.

## 2014-03-15 ENCOUNTER — Other Ambulatory Visit: Payer: Self-pay | Admitting: Gastroenterology

## 2014-03-17 ENCOUNTER — Encounter (HOSPITAL_COMMUNITY)
Admission: RE | Disposition: A | Discharge status: Home / Self Care | Payer: Self-pay | Source: Ambulatory Visit | Attending: Gastroenterology

## 2014-03-17 ENCOUNTER — Encounter (HOSPITAL_COMMUNITY): Payer: Self-pay | Admitting: *Deleted

## 2014-03-17 ENCOUNTER — Ambulatory Visit (HOSPITAL_COMMUNITY)
Admission: RE | Admit: 2014-03-17 | Discharge: 2014-03-17 | Disposition: A | Discharge status: Home / Self Care | Payer: 59 | Source: Ambulatory Visit | Attending: Gastroenterology | Admitting: Gastroenterology

## 2014-03-17 DIAGNOSIS — K5190 Ulcerative colitis, unspecified, without complications: Secondary | ICD-10-CM | POA: Insufficient documentation

## 2014-03-17 DIAGNOSIS — D509 Iron deficiency anemia, unspecified: Secondary | ICD-10-CM | POA: Insufficient documentation

## 2014-03-17 DIAGNOSIS — Y9210 Unspecified residential institution as the place of occurrence of the external cause: Secondary | ICD-10-CM | POA: Insufficient documentation

## 2014-03-17 DIAGNOSIS — IMO0002 Reserved for concepts with insufficient information to code with codable children: Secondary | ICD-10-CM | POA: Insufficient documentation

## 2014-03-17 DIAGNOSIS — D689 Coagulation defect, unspecified: Secondary | ICD-10-CM | POA: Insufficient documentation

## 2014-03-17 DIAGNOSIS — E785 Hyperlipidemia, unspecified: Secondary | ICD-10-CM | POA: Insufficient documentation

## 2014-03-17 DIAGNOSIS — Z86718 Personal history of other venous thrombosis and embolism: Secondary | ICD-10-CM | POA: Insufficient documentation

## 2014-03-17 DIAGNOSIS — Z86711 Personal history of pulmonary embolism: Secondary | ICD-10-CM | POA: Insufficient documentation

## 2014-03-17 DIAGNOSIS — K929 Disease of digestive system, unspecified: Secondary | ICD-10-CM | POA: Insufficient documentation

## 2014-03-17 DIAGNOSIS — Z91018 Allergy to other foods: Secondary | ICD-10-CM | POA: Insufficient documentation

## 2014-03-17 DIAGNOSIS — Z90710 Acquired absence of both cervix and uterus: Secondary | ICD-10-CM | POA: Insufficient documentation

## 2014-03-17 DIAGNOSIS — Z87891 Personal history of nicotine dependence: Secondary | ICD-10-CM | POA: Insufficient documentation

## 2014-03-17 DIAGNOSIS — Z85038 Personal history of other malignant neoplasm of large intestine: Secondary | ICD-10-CM | POA: Insufficient documentation

## 2014-03-17 DIAGNOSIS — I10 Essential (primary) hypertension: Secondary | ICD-10-CM | POA: Insufficient documentation

## 2014-03-17 DIAGNOSIS — Y832 Surgical operation with anastomosis, bypass or graft as the cause of abnormal reaction of the patient, or of later complication, without mention of misadventure at the time of the procedure: Secondary | ICD-10-CM | POA: Insufficient documentation

## 2014-03-17 HISTORY — PX: HOT HEMOSTASIS: SHX5433

## 2014-03-17 HISTORY — PX: FLEXIBLE SIGMOIDOSCOPY: SHX5431

## 2014-03-17 SURGERY — SIGMOIDOSCOPY, FLEXIBLE
Anesthesia: Moderate Sedation

## 2014-03-17 MED ORDER — SODIUM CHLORIDE 0.9 % IV SOLN
INTRAVENOUS | Status: DC
  Administered 2014-03-17: 500 mL via INTRAVENOUS

## 2014-03-17 MED ORDER — FENTANYL CITRATE 0.05 MG/ML IJ SOLN
INTRAMUSCULAR | Status: AC
  Filled 2014-03-17: qty 2

## 2014-03-17 MED ORDER — MIDAZOLAM HCL 10 MG/2ML IJ SOLN
INTRAMUSCULAR | Status: AC
  Filled 2014-03-17: qty 2

## 2014-03-17 NOTE — H&P (Signed)
   Mallory Morales HPI: Thisis a 58 year old female with a new onset IDA and she is s/p colon cancer resection (T3N0M0) by Dr. Zella Richer.  A repeat colonoscopy and a recent EGD revealed that she had some friability at the anastamotic site.  It is presumed that this may be the source of her iron deficiency.  The plan is to ablate the site with APC over several sessions in the hopes of resolving the IDA.  Past Medical History  Diagnosis Date  . Hypertension   . Hyperlipemia   . Anemia, iron deficiency 01/27/2012  . Clotting disorder   . Pulmonary embolism     taking injections -last done 1'14(Arixtra)  . DVT (deep venous thrombosis)   . Complication of anesthesia     "hard time getting breath" post op  . Colon cancer     T3N0M0, emergency surgery/with colostomy done-no further tx.    Past Surgical History  Procedure Laterality Date  . Abdominal hysterectomy    . Hernia repair    . Colon resection  01/14/2012    Procedure: COLON RESECTION;  Surgeon: Odis Hollingshead, MD;  Location: [facility];  Service: General;  Laterality: N/A;  left colectomy, mobilization of splenic flexure, ostomy.  . Colostomy takedown N/A 02/24/2013    Procedure: Laparoscopic Assisted Colostomy Closure;  Surgeon: Odis Hollingshead, MD;  Location: [facility];  Service: General;  Laterality: N/A;  Laparoscopic Assisted Colostomy Closure  . Colostomy  01/14/2012    Family History  Problem Relation Age of Onset  . Heart attack Father   . Hypertension Father   . Heart disease Father   . Stomach cancer Sister   . Pancreatic cancer Brother   . Cancer Brother     pancreatic    Social History:  reports that she quit smoking about 28 years ago. Her smoking use included Cigarettes. She started smoking about 39 years ago. She has a 5.5 pack-year smoking history. She has never used smokeless tobacco. She reports that she drinks about 0.6 ounces of alcohol per week. She reports that she does not use illicit drugs.  Allergies:   Allergies  Allergen Reactions  . Strawberry Swelling    Face only. Breathing not affected.    Medications: Scheduled: Continuous:  No results found for this or any previous visit (from the past 24 hour(s)).   No results found.  ROS:  As stated above in the HPI otherwise negative.  There were no vitals taken for this visit.    PE: Gen: NAD, Alert and Oriented HEENT:  [place]/AT, EOMI Neck: Supple, no LAD Lungs: CTA Bilaterally CV: RRR without M/G/R ABM: Soft, NTND, +BS Ext: No C/C/E  Assessment/Plan: 1) IDA. 2) Personal history of colon cancer.   I spoke with Dr. Zella Richer and the plan is to ablate site with APC over several sessions.  I will also biopsy the area again to make sure that no other pathology is present.  Plan: 1) APC of anastamosis.  Jackalynn Art D 03/17/2014, 9:02 AM

## 2014-03-17 NOTE — OR Nursing (Signed)
Patient given the option to be sedated or to have procedure without sedation.  She chose to have no sedation and tolerated the procedure well.

## 2014-03-17 NOTE — Op Note (Signed)
[facility] [place], [address]   FLEXIBLE SIGMOIDOSCOPY PROCEDURE REPORT  PATIENT: Mallory, Morales  MR#: [id number] BIRTHDATE: August 17, 1956 , 73  yrs. old GENDER: Female ENDOSCOPIST: Carol Ada, MD REFERRED BY: PROCEDURE DATE:  03/17/2014 PROCEDURE:   Sigmoidoscopy with ablation therapy ASA CLASS:   Class II INDICATIONS: Friable sigmoid anastamosis. MEDICATIONS: None  DESCRIPTION OF PROCEDURE:   After the risks benefits and alternatives of the procedure were thoroughly explained, informed consent was obtained.  revealed no abnormalities of the rectum. The endoscope was introduced through the anus  and advanced to the sigmoid colon , limited by No adverse events experienced.   The quality of the prep was good .  The instrument was then slowly withdrawn as the mucosa was fully examined.         FINDINGS: The sigmoid anastamosis was identified.  Minor trauma with the cold biopsy forceps precipitated bleeding.  Biopsies were obtained and then the area was treated with APC.  No further bleeding was identified.    Retroflexion was not performed.    The scope was then withdrawn from the patient and the procedure terminated.  COMPLICATIONS: There were no complications.  ENDOSCOPIC IMPRESSION: 1) Friable/granulation tissue at the sigmoid anastamosis s/p APC.  RECOMMENDATIONS: 1) Repeat the procedure in 3 weeks.  REPEAT EXAM:   _______________________________ eSignedCarol Ada, MD 03/17/2014 1:17 PM   CC:

## 2014-03-20 ENCOUNTER — Encounter (HOSPITAL_COMMUNITY): Payer: Self-pay | Admitting: Gastroenterology

## 2014-03-27 ENCOUNTER — Encounter (INDEPENDENT_AMBULATORY_CARE_PROVIDER_SITE_OTHER): Payer: 59 | Admitting: General Surgery

## 2014-03-27 ENCOUNTER — Ambulatory Visit (INDEPENDENT_AMBULATORY_CARE_PROVIDER_SITE_OTHER): Payer: 59 | Admitting: General Surgery

## 2014-03-27 ENCOUNTER — Other Ambulatory Visit: Payer: Self-pay | Admitting: Gastroenterology

## 2014-03-29 ENCOUNTER — Encounter (HOSPITAL_COMMUNITY): Payer: Self-pay | Admitting: Pharmacy Technician

## 2014-03-29 ENCOUNTER — Encounter (HOSPITAL_COMMUNITY): Payer: Self-pay | Admitting: *Deleted

## 2014-03-29 NOTE — Progress Notes (Signed)
03-29-14 1550 Noted procedure time change to now 1100 AM, patient left voice message to arrive at 0930 AM ,and nothing by mouth past 12 midnight. W. Floy Sabina

## 2014-04-07 ENCOUNTER — Encounter (HOSPITAL_COMMUNITY): Payer: Self-pay

## 2014-04-07 ENCOUNTER — Encounter (HOSPITAL_COMMUNITY)
Admission: RE | Disposition: A | Discharge status: Home / Self Care | Payer: Self-pay | Source: Ambulatory Visit | Attending: Gastroenterology

## 2014-04-07 ENCOUNTER — Ambulatory Visit (HOSPITAL_COMMUNITY)
Admission: RE | Admit: 2014-04-07 | Discharge: 2014-04-07 | Disposition: A | Discharge status: Home / Self Care | Payer: 59 | Source: Ambulatory Visit | Attending: Gastroenterology | Admitting: Gastroenterology

## 2014-04-07 DIAGNOSIS — Z85038 Personal history of other malignant neoplasm of large intestine: Secondary | ICD-10-CM | POA: Insufficient documentation

## 2014-04-07 DIAGNOSIS — Z86718 Personal history of other venous thrombosis and embolism: Secondary | ICD-10-CM | POA: Insufficient documentation

## 2014-04-07 DIAGNOSIS — Z90710 Acquired absence of both cervix and uterus: Secondary | ICD-10-CM | POA: Insufficient documentation

## 2014-04-07 DIAGNOSIS — K625 Hemorrhage of anus and rectum: Secondary | ICD-10-CM | POA: Insufficient documentation

## 2014-04-07 DIAGNOSIS — E785 Hyperlipidemia, unspecified: Secondary | ICD-10-CM | POA: Insufficient documentation

## 2014-04-07 DIAGNOSIS — Z87891 Personal history of nicotine dependence: Secondary | ICD-10-CM | POA: Insufficient documentation

## 2014-04-07 DIAGNOSIS — Z86711 Personal history of pulmonary embolism: Secondary | ICD-10-CM | POA: Insufficient documentation

## 2014-04-07 DIAGNOSIS — I10 Essential (primary) hypertension: Secondary | ICD-10-CM | POA: Insufficient documentation

## 2014-04-07 DIAGNOSIS — Z980 Intestinal bypass and anastomosis status: Secondary | ICD-10-CM | POA: Insufficient documentation

## 2014-04-07 DIAGNOSIS — Z9049 Acquired absence of other specified parts of digestive tract: Secondary | ICD-10-CM | POA: Insufficient documentation

## 2014-04-07 DIAGNOSIS — D509 Iron deficiency anemia, unspecified: Secondary | ICD-10-CM | POA: Insufficient documentation

## 2014-04-07 HISTORY — PX: HOT HEMOSTASIS: SHX5433

## 2014-04-07 HISTORY — PX: FLEXIBLE SIGMOIDOSCOPY: SHX5431

## 2014-04-07 SURGERY — SIGMOIDOSCOPY, FLEXIBLE

## 2014-04-07 MED ORDER — PROPOFOL 10 MG/ML IV BOLUS
INTRAVENOUS | Status: AC
  Filled 2014-04-07: qty 20

## 2014-04-07 MED ORDER — SODIUM CHLORIDE 0.9 % IV SOLN: INTRAVENOUS | Status: DC

## 2014-04-07 MED ORDER — MIDAZOLAM HCL 2 MG/2ML IJ SOLN
INTRAMUSCULAR | Status: AC
  Filled 2014-04-07: qty 2

## 2014-04-07 MED ORDER — LIDOCAINE HCL (CARDIAC) 20 MG/ML IV SOLN
INTRAVENOUS | Status: AC
  Filled 2014-04-07: qty 5

## 2014-04-07 NOTE — Op Note (Signed)
[facility] [place], [address]   FLEXIBLE SIGMOIDOSCOPY PROCEDURE REPORT  PATIENT: Mallory Morales, Mallory Morales  MR#: [id number] BIRTHDATE: 1956/05/20 , 67  yrs. old GENDER: Female ENDOSCOPIST: Carol Ada, MD REFERRED BY: PROCEDURE DATE:  04/07/2014 PROCEDURE:   Sigmoidoscopy, diagnostic ASA CLASS:   Class II INDICATIONS:rectal bleeding. MEDICATIONS: None  DESCRIPTION OF PROCEDURE:   After the risks benefits and alternatives of the procedure were thoroughly explained, informed consent was obtained.  revealed no abnormalities of the rectum. The endoscope was introduced through the anus  and advanced to the sigmoid colon , limited by No adverse events experienced.   The quality of the prep was good .  The instrument was then slowly withdrawn as the mucosa was fully examined.         FINDINGS: The anastamosis was again identified.  Some minor friability was noted, but this was much improved compared to the prior examination.  A partially circumfirential ulceration was identified consistent with the healing process.  No other abnormalities identified. Retroflexion was not performed.    The scope was then withdrawn from the patient and the procedure terminated.  COMPLICATIONS: There were no complications.  ENDOSCOPIC IMPRESSION: 1) Healing anastamosis from the prior APC.  Bleeding minimized.  RECOMMENDATIONS: 1) Check CBC in 3 months. 2) FFS as needed.  REPEAT EXAM:   _______________________________ eSignedCarol Ada, MD 04/07/2014 10:47 AM   CC:

## 2014-04-07 NOTE — H&P (View-Only) (Signed)
   Mallory Morales HPI: Thisis a 58 year old female with a new onset IDA and she is s/p colon cancer resection (T3N0M0) by Dr. Rosenbower.  A repeat colonoscopy and a recent EGD revealed that she had some friability at the anastamotic site.  It is presumed that this may be the source of her iron deficiency.  The plan is to ablate the site with APC over several sessions in the hopes of resolving the IDA.  Past Medical History  Diagnosis Date  . Hypertension   . Hyperlipemia   . Anemia, iron deficiency 01/27/2012  . Clotting disorder   . Pulmonary embolism     taking injections -last done 1'14(Arixtra)  . DVT (deep venous thrombosis)   . Complication of anesthesia     "hard time getting breath" post op  . Colon cancer     T3N0M0, emergency surgery/with colostomy done-no further tx.    Past Surgical History  Procedure Laterality Date  . Abdominal hysterectomy    . Hernia repair    . Colon resection  01/14/2012    Procedure: COLON RESECTION;  Surgeon: Todd J Rosenbower, MD;  Location: [facility];  Service: General;  Laterality: N/A;  left colectomy, mobilization of splenic flexure, ostomy.  . Colostomy takedown N/A 02/24/2013    Procedure: Laparoscopic Assisted Colostomy Closure;  Surgeon: Todd J Rosenbower, MD;  Location: [facility];  Service: General;  Laterality: N/A;  Laparoscopic Assisted Colostomy Closure  . Colostomy  01/14/2012    Family History  Problem Relation Age of Onset  . Heart attack Father   . Hypertension Father   . Heart disease Father   . Stomach cancer Sister   . Pancreatic cancer Brother   . Cancer Brother     pancreatic    Social History:  reports that she quit smoking about 28 years ago. Her smoking use included Cigarettes. She started smoking about 39 years ago. She has a 5.5 pack-year smoking history. She has never used smokeless tobacco. She reports that she drinks about 0.6 ounces of alcohol per week. She reports that she does not use illicit drugs.  Allergies:   Allergies  Allergen Reactions  . Strawberry Swelling    Face only. Breathing not affected.    Medications: Scheduled: Continuous:  No results found for this or any previous visit (from the past 24 hour(s)).   No results found.  ROS:  As stated above in the HPI otherwise negative.  There were no vitals taken for this visit.    PE: Gen: NAD, Alert and Oriented HEENT:  [place]/AT, EOMI Neck: Supple, no LAD Lungs: CTA Bilaterally CV: RRR without M/G/R ABM: Soft, NTND, +BS Ext: No C/C/E  Assessment/Plan: 1) IDA. 2) Personal history of colon cancer.   I spoke with Dr. Rosenbower and the plan is to ablate site with APC over several sessions.  I will also biopsy the area again to make sure that no other pathology is present.  Plan: 1) APC of anastamosis.  Strummer Canipe D 03/17/2014, 9:02 AM      

## 2014-04-07 NOTE — Interval H&P Note (Signed)
History and Physical Interval Note:  04/07/2014 9:54 AM  Mallory Morales  has presented today for surgery, with the diagnosis of AVMs   The various methods of treatment have been discussed with the patient and family. After consideration of risks, benefits and other options for treatment, the patient has consented to  Procedure(s) with comments: FLEXIBLE SIGMOIDOSCOPY (N/A) - no sedation HOT HEMOSTASIS (ARGON PLASMA COAGULATION/BICAP) (N/A) as a surgical intervention .  The patient's history has been reviewed, patient examined, no change in status, stable for surgery.  I have reviewed the patient's chart and labs.  Questions were answered to the patient's satisfaction.     Beryle Beams

## 2014-04-10 ENCOUNTER — Encounter (INDEPENDENT_AMBULATORY_CARE_PROVIDER_SITE_OTHER): Payer: Self-pay

## 2014-04-10 ENCOUNTER — Encounter (HOSPITAL_COMMUNITY): Payer: Self-pay | Admitting: Gastroenterology

## 2014-04-14 ENCOUNTER — Encounter (INDEPENDENT_AMBULATORY_CARE_PROVIDER_SITE_OTHER): Payer: Self-pay

## 2014-04-28 ENCOUNTER — Other Ambulatory Visit (HOSPITAL_BASED_OUTPATIENT_CLINIC_OR_DEPARTMENT_OTHER): Payer: 59 | Admitting: Lab

## 2014-04-28 DIAGNOSIS — C186 Malignant neoplasm of descending colon: Secondary | ICD-10-CM

## 2014-04-28 DIAGNOSIS — K922 Gastrointestinal hemorrhage, unspecified: Secondary | ICD-10-CM

## 2014-04-28 DIAGNOSIS — D509 Iron deficiency anemia, unspecified: Secondary | ICD-10-CM

## 2014-04-28 LAB — CBC WITH DIFFERENTIAL (CANCER CENTER ONLY)
BASO#: 0 10*3/uL (ref 0.0–0.2)
BASO%: 0.5 % (ref 0.0–2.0)
EOS ABS: 0.1 10*3/uL (ref 0.0–0.5)
EOS%: 1.3 % (ref 0.0–7.0)
HEMATOCRIT: 32.6 % — AB (ref 34.8–46.6)
HEMOGLOBIN: 11.5 g/dL — AB (ref 11.6–15.9)
LYMPH#: 1.6 10*3/uL (ref 0.9–3.3)
LYMPH%: 43.1 % (ref 14.0–48.0)
MCH: 31.6 pg (ref 26.0–34.0)
MCHC: 35.3 g/dL (ref 32.0–36.0)
MCV: 90 fL (ref 81–101)
MONO#: 0.2 10*3/uL (ref 0.1–0.9)
MONO%: 6.2 % (ref 0.0–13.0)
NEUT#: 1.8 10*3/uL (ref 1.5–6.5)
NEUT%: 48.9 % (ref 39.6–80.0)
Platelets: 144 10*3/uL — ABNORMAL LOW (ref 145–400)
RBC: 3.64 10*6/uL — AB (ref 3.70–5.32)
RDW: 14.6 % (ref 11.1–15.7)
WBC: 3.7 10*3/uL — ABNORMAL LOW (ref 3.9–10.0)

## 2014-05-01 LAB — IRON AND TIBC CHCC
%SAT: 24 % (ref 21–57)
Iron: 75 ug/dL (ref 41–142)
TIBC: 319 ug/dL (ref 236–444)
UIBC: 244 ug/dL (ref 120–384)

## 2014-05-01 LAB — FERRITIN CHCC: FERRITIN: 296 ng/mL — AB (ref 9–269)

## 2014-05-02 LAB — RETICULOCYTES (CHCC)
ABS RETIC: 86.9 10*3/uL (ref 19.0–186.0)
RBC.: 3.62 MIL/uL — ABNORMAL LOW (ref 3.87–5.11)
RETIC CT PCT: 2.4 % — AB (ref 0.4–2.3)

## 2014-05-02 LAB — HEMOGLOBINOPATHY EVALUATION
HGB S QUANTITAION: 0 %
Hemoglobin Other: 0 %
Hgb A2 Quant: 2.6 % (ref 2.2–3.2)
Hgb A: 97.1 % (ref 96.8–97.8)
Hgb F Quant: 0.3 % (ref 0.0–2.0)

## 2014-05-02 LAB — CEA: CEA: 3.2 ng/mL (ref 0.0–5.0)

## 2014-05-03 ENCOUNTER — Encounter: Payer: Self-pay | Admitting: *Deleted

## 2014-06-30 ENCOUNTER — Other Ambulatory Visit: Payer: 59 | Admitting: Lab

## 2014-06-30 ENCOUNTER — Other Ambulatory Visit (HOSPITAL_BASED_OUTPATIENT_CLINIC_OR_DEPARTMENT_OTHER): Payer: 59

## 2014-06-30 DIAGNOSIS — C186 Malignant neoplasm of descending colon: Secondary | ICD-10-CM

## 2014-06-30 DIAGNOSIS — D509 Iron deficiency anemia, unspecified: Secondary | ICD-10-CM

## 2014-06-30 LAB — COMPREHENSIVE METABOLIC PANEL (CC13)
ALBUMIN: 4.2 g/dL (ref 3.5–5.0)
ALK PHOS: 61 U/L (ref 40–150)
ALT: 15 U/L (ref 0–55)
AST: 18 U/L (ref 5–34)
Anion Gap: 11 mEq/L (ref 3–11)
BUN: 12.9 mg/dL (ref 7.0–26.0)
CO2: 27 mEq/L (ref 22–29)
Calcium: 9.7 mg/dL (ref 8.4–10.4)
Chloride: 106 mEq/L (ref 98–109)
Creatinine: 0.8 mg/dL (ref 0.6–1.1)
Glucose: 95 mg/dl (ref 70–140)
POTASSIUM: 3.7 meq/L (ref 3.5–5.1)
Sodium: 144 mEq/L (ref 136–145)
Total Bilirubin: 0.65 mg/dL (ref 0.20–1.20)
Total Protein: 7.3 g/dL (ref 6.4–8.3)

## 2014-06-30 LAB — CBC WITH DIFFERENTIAL/PLATELET
BASO%: 1.1 % (ref 0.0–2.0)
Basophils Absolute: 0 10*3/uL (ref 0.0–0.1)
EOS ABS: 0.1 10*3/uL (ref 0.0–0.5)
EOS%: 2.1 % (ref 0.0–7.0)
HEMATOCRIT: 38.6 % (ref 34.8–46.6)
HEMOGLOBIN: 13.2 g/dL (ref 11.6–15.9)
LYMPH%: 42.3 % (ref 14.0–49.7)
MCH: 31.7 pg (ref 25.1–34.0)
MCHC: 34.2 g/dL (ref 31.5–36.0)
MCV: 92.6 fL (ref 79.5–101.0)
MONO#: 0.2 10*3/uL (ref 0.1–0.9)
MONO%: 5.6 % (ref 0.0–14.0)
NEUT%: 48.9 % (ref 38.4–76.8)
NEUTROS ABS: 1.5 10*3/uL (ref 1.5–6.5)
RBC: 4.17 10*6/uL (ref 3.70–5.45)
RDW: 12.2 % (ref 11.2–14.5)
WBC: 3.1 10*3/uL — ABNORMAL LOW (ref 3.9–10.3)
lymph#: 1.3 10*3/uL (ref 0.9–3.3)

## 2014-06-30 LAB — IRON AND TIBC CHCC
%SAT: 19 % — ABNORMAL LOW (ref 21–57)
Iron: 75 ug/dL (ref 41–142)
TIBC: 403 ug/dL (ref 236–444)
UIBC: 328 ug/dL (ref 120–384)

## 2014-06-30 LAB — FERRITIN CHCC: Ferritin: 69 ng/ml (ref 9–269)

## 2014-07-05 ENCOUNTER — Telehealth: Payer: Self-pay | Admitting: *Deleted

## 2014-07-05 NOTE — Telephone Encounter (Signed)
Set pt up with appt for July 21 at 10am.

## 2014-07-05 NOTE — Telephone Encounter (Addendum)
Message copied by Lenn Sink on Wed Jul 05, 2014  2:21 PM ------      Message from: Burney Gauze R      Created: Wed Jul 05, 2014 11:01 AM       Call - iron is dropping. Need feraheme 1020mg  x 1 dose.  Please set up for 1-2 weeks.  pete ------Left voicemail informing pt that iron is low and she needs IV iron in the next 1-2 weeks. Awaiting call back.

## 2014-07-11 ENCOUNTER — Ambulatory Visit (HOSPITAL_BASED_OUTPATIENT_CLINIC_OR_DEPARTMENT_OTHER): Payer: 59

## 2014-07-11 VITALS — BP 147/86 | HR 72 | Temp 97.3°F | Resp 18

## 2014-07-11 DIAGNOSIS — D509 Iron deficiency anemia, unspecified: Secondary | ICD-10-CM

## 2014-07-11 MED ORDER — SODIUM CHLORIDE 0.9 % IV SOLN
INTRAVENOUS | Status: DC
  Administered 2014-07-11: 11:00:00 via INTRAVENOUS

## 2014-07-11 MED ORDER — SODIUM CHLORIDE 0.9 % IV SOLN
1020.0000 mg | Freq: Once | INTRAVENOUS | Status: AC
  Administered 2014-07-11: 1020 mg via INTRAVENOUS
  Filled 2014-07-11: qty 34

## 2014-07-11 NOTE — Patient Instructions (Signed)

## 2014-08-25 ENCOUNTER — Ambulatory Visit: Payer: 59 | Admitting: Hematology & Oncology

## 2014-08-25 ENCOUNTER — Other Ambulatory Visit: Payer: 59 | Admitting: Lab

## 2014-08-29 ENCOUNTER — Ambulatory Visit (HOSPITAL_BASED_OUTPATIENT_CLINIC_OR_DEPARTMENT_OTHER): Payer: 59 | Admitting: Hematology & Oncology

## 2014-08-29 ENCOUNTER — Other Ambulatory Visit (HOSPITAL_BASED_OUTPATIENT_CLINIC_OR_DEPARTMENT_OTHER): Payer: 59 | Admitting: Lab

## 2014-08-29 ENCOUNTER — Encounter: Payer: Self-pay | Admitting: Hematology & Oncology

## 2014-08-29 VITALS — BP 130/77 | HR 77 | Temp 98.0°F | Resp 14 | Ht 65.0 in | Wt 180.0 lb

## 2014-08-29 DIAGNOSIS — C186 Malignant neoplasm of descending colon: Secondary | ICD-10-CM

## 2014-08-29 DIAGNOSIS — D509 Iron deficiency anemia, unspecified: Secondary | ICD-10-CM

## 2014-08-29 DIAGNOSIS — K922 Gastrointestinal hemorrhage, unspecified: Secondary | ICD-10-CM

## 2014-08-29 LAB — CBC WITH DIFFERENTIAL (CANCER CENTER ONLY)
BASO#: 0 10*3/uL (ref 0.0–0.2)
BASO%: 0.6 % (ref 0.0–2.0)
EOS%: 1.9 % (ref 0.0–7.0)
Eosinophils Absolute: 0.1 10*3/uL (ref 0.0–0.5)
HEMATOCRIT: 35.7 % (ref 34.8–46.6)
HGB: 12.8 g/dL (ref 11.6–15.9)
LYMPH#: 1.2 10*3/uL (ref 0.9–3.3)
LYMPH%: 31.9 % (ref 14.0–48.0)
MCH: 32 pg (ref 26.0–34.0)
MCHC: 35.9 g/dL (ref 32.0–36.0)
MCV: 89 fL (ref 81–101)
MONO#: 0.3 10*3/uL (ref 0.1–0.9)
MONO%: 7.5 % (ref 0.0–13.0)
NEUT#: 2.1 10*3/uL (ref 1.5–6.5)
NEUT%: 58.1 % (ref 39.6–80.0)
PLATELETS: 144 10*3/uL — AB (ref 145–400)
RBC: 4 10*6/uL (ref 3.70–5.32)
RDW: 12.1 % (ref 11.1–15.7)
WBC: 3.6 10*3/uL — ABNORMAL LOW (ref 3.9–10.0)

## 2014-08-29 LAB — CHCC SATELLITE - SMEAR

## 2014-08-29 NOTE — Progress Notes (Signed)
Hematology and Oncology Follow Up Visit  CEAIRRA MCCARVER [id number] January 02, 1956 58 y.o. 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  Rasool is back for followup. Last saw her back in March. Since then, she doing quite well. She actually went on vacation to [place]. She great time. She went on a cruise.  We last gave her iron back in March. At that point in time, iron saturation was 19 %. Her total iron was down to 69. For her, this is quite low.  Her last scans were done in January. This complaining of some back pain. This is of this is in the lower part of the spine. It seems to radiate around her sides. She has no frank abdominal pain. She had no nausea vomiting. There's been no change in bowel or bladder habits.  Her last CEA level was at 3.2 back in May.  Overall, her performance status is ECOG 1  Medications: Current outpatient prescriptions:hydrochlorothiazide (HYDRODIURIL) 25 MG tablet, Take 25 mg by mouth every morning., Disp: , Rfl: ;  simvastatin (ZOCOR) 40 MG tablet, Take 40 mg by mouth every morning., Disp: , Rfl:   Allergies:  Allergies  Allergen Reactions  . Strawberry Swelling    Face only. Breathing not affected.    Past Medical History, Surgical history, Social history, and Family History were reviewed and updated.  Review of Systems: As above  Physical Exam:  height is 5\' 5"  (1.651 m) and weight is 180 lb (81.647 kg). Her oral temperature is 98 F (36.7 C). Her blood pressure is 130/77 and her pulse is 77. Her respiration is 14.   Well-developed and well-nourished African American female. Her abdominal exam shows a soft abdomen she has of abdominal wounds are well-healed. She has some keloid formation. She has no fluid  wave. There is some slight guarding. She has no obvious liver or spleen tip. Lungs are clear. Cardiac exam regular in rhythm. She is a 1/6 systolic murmur. Extremities shows no clubbing, cyanosis or edema. Back exam is shows no tenderness over the spine. No spasms noted in the paravertebral muscles. Skin exam no rashes, ecchymosis or petechia. Head exam shows no ocular or oral lesions. She is no palpable cervical or supraclavicular lymph nodes.  Lab Results  Component Value Date   WBC 3.6* 08/29/2014   HGB 12.8 08/29/2014   HCT 35.7 08/29/2014   MCV 89 08/29/2014   PLT 144* 08/29/2014     Chemistry      Component Value Date/Time   NA 144 06/30/2014 0842   NA 142 01/12/2014 0836   K 3.7 06/30/2014 0842   K 4.1 01/12/2014 0836   CL 104 01/12/2014 0836   CO2 27 06/30/2014 0842   CO2 25 01/12/2014 0836   BUN 12.9 06/30/2014 0842   BUN 11 01/12/2014 0836   CREATININE 0.8 06/30/2014 0842   CREATININE 0.77 01/12/2014 0836      Component Value Date/Time   CALCIUM 9.7 06/30/2014 0842   CALCIUM 9.0 01/12/2014 0836   ALKPHOS 61 06/30/2014 0842   ALKPHOS 55 01/12/2014 0836   AST 18 06/30/2014 0842   AST 21 01/12/2014 0836   ALT 15 06/30/2014 0842   ALT 13 01/12/2014 0836   BILITOT  0.65 06/30/2014 0842   BILITOT 0.7 01/12/2014 0836         Impression and Plan: Ms. Arman is 58 year old Afro-American female. She has a history of stage II adenocarcinoma of the sigmoid colon. She had an Oncotype score of 20. She had 36 lymph nodes that were negative. She did not require any adjuvant therapy.  I will go ahead and plan for another scan on her. I worry about the symptoms that she is having. She is at risk for recurrence. She was diagnosed back in 2013. She is a little over 2 years out now. Again, she does have a risk of recurrence.  I will go ahead and plan to get her back in another 6 months if all looks good on her scans.   [name], MD 9/8/20154:19 PM

## 2014-08-30 LAB — IRON AND TIBC CHCC
%SAT: 34 % (ref 21–57)
IRON: 100 ug/dL (ref 41–142)
TIBC: 296 ug/dL (ref 236–444)
UIBC: 196 ug/dL (ref 120–384)

## 2014-08-30 LAB — FERRITIN CHCC: Ferritin: 465 ng/ml — ABNORMAL HIGH (ref 9–269)

## 2014-08-31 ENCOUNTER — Telehealth: Payer: Self-pay | Admitting: Hematology & Oncology

## 2014-08-31 NOTE — Telephone Encounter (Signed)
Pt aware of 9-17 CT to drink at 10 and 11 and to be NPO 4 hrs prior

## 2014-09-05 ENCOUNTER — Encounter: Payer: Self-pay | Admitting: *Deleted

## 2014-09-07 ENCOUNTER — Ambulatory Visit (HOSPITAL_BASED_OUTPATIENT_CLINIC_OR_DEPARTMENT_OTHER): Payer: 59

## 2014-09-07 ENCOUNTER — Ambulatory Visit (HOSPITAL_BASED_OUTPATIENT_CLINIC_OR_DEPARTMENT_OTHER)
Admission: RE | Admit: 2014-09-07 | Discharge: 2014-09-07 | Disposition: A | Discharge status: Home / Self Care | Payer: 59 | Source: Ambulatory Visit | Attending: Hematology & Oncology | Admitting: Hematology & Oncology

## 2014-09-07 ENCOUNTER — Other Ambulatory Visit (HOSPITAL_BASED_OUTPATIENT_CLINIC_OR_DEPARTMENT_OTHER): Payer: 59

## 2014-09-07 DIAGNOSIS — M549 Dorsalgia, unspecified: Secondary | ICD-10-CM | POA: Diagnosis not present

## 2014-09-07 DIAGNOSIS — R109 Unspecified abdominal pain: Secondary | ICD-10-CM | POA: Insufficient documentation

## 2014-09-07 DIAGNOSIS — C186 Malignant neoplasm of descending colon: Secondary | ICD-10-CM

## 2014-09-07 DIAGNOSIS — C189 Malignant neoplasm of colon, unspecified: Secondary | ICD-10-CM | POA: Insufficient documentation

## 2014-09-07 DIAGNOSIS — D509 Iron deficiency anemia, unspecified: Secondary | ICD-10-CM

## 2014-09-07 DIAGNOSIS — R918 Other nonspecific abnormal finding of lung field: Secondary | ICD-10-CM | POA: Diagnosis not present

## 2014-09-07 MED ORDER — IOHEXOL 300 MG/ML  SOLN
100.0000 mL | Freq: Once | INTRAMUSCULAR | Status: AC | PRN
  Administered 2014-09-07: 100 mL via INTRAVENOUS

## 2014-09-08 ENCOUNTER — Encounter: Payer: Self-pay | Admitting: *Deleted

## 2014-10-06 ENCOUNTER — Other Ambulatory Visit: Payer: Self-pay

## 2015-02-20 ENCOUNTER — Other Ambulatory Visit: Payer: Self-pay | Admitting: Family

## 2015-03-01 ENCOUNTER — Other Ambulatory Visit (HOSPITAL_BASED_OUTPATIENT_CLINIC_OR_DEPARTMENT_OTHER): Payer: 59 | Admitting: Lab

## 2015-03-01 ENCOUNTER — Encounter: Payer: Self-pay | Admitting: Hematology & Oncology

## 2015-03-01 ENCOUNTER — Ambulatory Visit (HOSPITAL_BASED_OUTPATIENT_CLINIC_OR_DEPARTMENT_OTHER): Payer: 59 | Admitting: Hematology & Oncology

## 2015-03-01 VITALS — BP 130/82 | HR 69 | Temp 97.9°F | Resp 14 | Ht 65.0 in | Wt 174.0 lb

## 2015-03-01 DIAGNOSIS — D509 Iron deficiency anemia, unspecified: Secondary | ICD-10-CM

## 2015-03-01 DIAGNOSIS — C187 Malignant neoplasm of sigmoid colon: Secondary | ICD-10-CM

## 2015-03-01 DIAGNOSIS — C186 Malignant neoplasm of descending colon: Secondary | ICD-10-CM

## 2015-03-01 LAB — CBC WITH DIFFERENTIAL (CANCER CENTER ONLY)
BASO#: 0 10*3/uL (ref 0.0–0.2)
BASO%: 0.5 % (ref 0.0–2.0)
EOS%: 0.8 % (ref 0.0–7.0)
Eosinophils Absolute: 0 10*3/uL (ref 0.0–0.5)
HEMATOCRIT: 38.8 % (ref 34.8–46.6)
HEMOGLOBIN: 13.5 g/dL (ref 11.6–15.9)
LYMPH#: 1.4 10*3/uL (ref 0.9–3.3)
LYMPH%: 37.9 % (ref 14.0–48.0)
MCH: 31 pg (ref 26.0–34.0)
MCHC: 34.8 g/dL (ref 32.0–36.0)
MCV: 89 fL (ref 81–101)
MONO#: 0.2 10*3/uL (ref 0.1–0.9)
MONO%: 6.2 % (ref 0.0–13.0)
NEUT#: 2 10*3/uL (ref 1.5–6.5)
NEUT%: 54.6 % (ref 39.6–80.0)
PLATELETS: 150 10*3/uL (ref 145–400)
RBC: 4.36 10*6/uL (ref 3.70–5.32)
RDW: 12.1 % (ref 11.1–15.7)
WBC: 3.7 10*3/uL — AB (ref 3.9–10.0)

## 2015-03-01 LAB — CMP (CANCER CENTER ONLY)
ALBUMIN: 4.1 g/dL (ref 3.3–5.5)
ALT(SGPT): 22 U/L (ref 10–47)
AST: 24 U/L (ref 11–38)
Alkaline Phosphatase: 62 U/L (ref 26–84)
BILIRUBIN TOTAL: 1.3 mg/dL (ref 0.20–1.60)
BUN, Bld: 12 mg/dL (ref 7–22)
CO2: 31 meq/L (ref 18–33)
Calcium: 9.4 mg/dL (ref 8.0–10.3)
Chloride: 104 mEq/L (ref 98–108)
Creat: 0.8 mg/dl (ref 0.6–1.2)
GLUCOSE: 101 mg/dL (ref 73–118)
POTASSIUM: 3.9 meq/L (ref 3.3–4.7)
Sodium: 141 mEq/L (ref 128–145)
TOTAL PROTEIN: 7.4 g/dL (ref 6.4–8.1)

## 2015-03-01 LAB — IRON AND TIBC CHCC
%SAT: 35 % (ref 21–57)
IRON: 109 ug/dL (ref 41–142)
TIBC: 316 ug/dL (ref 236–444)
UIBC: 207 ug/dL (ref 120–384)

## 2015-03-01 LAB — RETICULOCYTES (CHCC)
ABS Retic: 52.1 10*3/uL (ref 19.0–186.0)
RBC.: 4.34 MIL/uL (ref 3.87–5.11)
Retic Ct Pct: 1.2 % (ref 0.4–2.3)

## 2015-03-01 LAB — LACTATE DEHYDROGENASE: LDH: 160 U/L (ref 94–250)

## 2015-03-01 LAB — FERRITIN CHCC: Ferritin: 362 ng/ml — ABNORMAL HIGH (ref 9–269)

## 2015-03-01 LAB — CEA: CEA: 3.3 ng/mL (ref 0.0–5.0)

## 2015-03-01 NOTE — Progress Notes (Signed)
Hematology and Oncology Follow Up Visit  Mallory Morales [id number] 1956-07-04 59 y.o. 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 is back for followup. We last saw her back in September. She did have some scans done in September. The CT scans did not show any evidence of recurrent disease.  She feels well. She's had no problems with abdominal pain. There's been no obvious bleeding. She's had no leg swelling.  She is still working. She really has no problems with fatigue or weakness.  She's had no rashes.  Plans go back to [place] in June. She will be going on a cruise. She is looking for to this. Her last CEA level was at 3.2 back in May.  Overall, her performance status is ECOG 1  Medications:  Current outpatient prescriptions:  .  hydrochlorothiazide (HYDRODIURIL) 25 MG tablet, Take 25 mg by mouth every morning., Disp: , Rfl:  .  simvastatin (ZOCOR) 40 MG tablet, Take 40 mg by mouth every morning., Disp: , Rfl:   Allergies:  Allergies  Allergen Reactions  . Strawberry Swelling    Face only. Breathing not affected.    Past Medical History, Surgical history, Social history, and Family History were reviewed and updated.  Review of Systems: As above  Physical Exam:  height is 5\' 5"  (1.651 m) and weight is 174 lb (78.926 kg). Her oral temperature is 97.9 F (36.6 C). Her blood pressure is 130/82 and her pulse is 69. Her respiration is 14.   Well-developed and well-nourished African American female. Her abdominal exam shows a soft abdomen she has of abdominal wounds are well-healed. She has some keloid formation. She has no fluid wave. There is some slight guarding. She has no obvious liver or spleen tip. Lungs are clear.  Cardiac exam regular rate and rhythm. She has a 1/6 systolic murmur. Extremities shows no clubbing, cyanosis or edema. Back exam is shows no tenderness over the spine. No spasms noted in the paravertebral muscles. Skin exam shows no rashes, ecchymosis or petechia. Head and neck exam shows no ocular or oral lesions. She has no palpable cervical or supraclavicular lymph nodes.  Lab Results  Component Value Date   WBC 3.7* 03/01/2015   HGB 13.5 03/01/2015   HCT 38.8 03/01/2015   MCV 89 03/01/2015   PLT 150 03/01/2015     Chemistry      Component Value Date/Time   NA 141 03/01/2015 1026   NA 144 06/30/2014 0842   NA 142 01/12/2014 0836   K 3.9 03/01/2015 1026   K 3.7 06/30/2014 0842   K 4.1 01/12/2014 0836   CL 104 03/01/2015 1026   CL 104 01/12/2014 0836   CO2 31 03/01/2015 1026   CO2 27 06/30/2014 0842   CO2 25 01/12/2014 0836   BUN 12 03/01/2015 1026   BUN 12.9 06/30/2014 0842   BUN 11 01/12/2014 0836   CREATININE 0.8 03/01/2015 1026   CREATININE 0.8 06/30/2014 0842   CREATININE 0.77 01/12/2014 0836      Component Value Date/Time   CALCIUM 9.4 03/01/2015 1026   CALCIUM 9.7 06/30/2014 0842   CALCIUM 9.0 01/12/2014 0836   ALKPHOS 62 03/01/2015 1026   ALKPHOS 61 06/30/2014 0842  ALKPHOS 55 01/12/2014 0836   AST 24 03/01/2015 1026   AST 18 06/30/2014 0842   AST 21 01/12/2014 0836   ALT 22 03/01/2015 1026   ALT 15 06/30/2014 0842   ALT 13 01/12/2014 0836   BILITOT 1.30 03/01/2015 1026   BILITOT 0.65 06/30/2014 0842   BILITOT 0.7 01/12/2014 0836         Impression and Plan: [name] is 59year-old Afro-American female. She has a history of stage II adenocarcinoma of the sigmoid colon. She had an Oncotype score of 20. She had 36 lymph nodes that were negative. She did not require any adjuvant therapy.  So far, everything looks okay. I think her risk of recurrence is positive be less than 10%. I think that the fact that she had 36 negative lymph nodes is the most  important prognostic factor for her.  I will go ahead and plan to get her back in another 6 months . I don't think she needs any scans done unless we see some with her lab work.   [name], MD 3/10/201611:12 AM

## 2015-08-29 ENCOUNTER — Encounter: Payer: Self-pay | Admitting: Hematology & Oncology

## 2015-08-29 ENCOUNTER — Other Ambulatory Visit (HOSPITAL_BASED_OUTPATIENT_CLINIC_OR_DEPARTMENT_OTHER): Payer: Commercial Managed Care - HMO

## 2015-08-29 ENCOUNTER — Telehealth: Payer: Self-pay | Admitting: *Deleted

## 2015-08-29 ENCOUNTER — Ambulatory Visit (HOSPITAL_BASED_OUTPATIENT_CLINIC_OR_DEPARTMENT_OTHER): Payer: Commercial Managed Care - HMO | Admitting: Hematology & Oncology

## 2015-08-29 VITALS — BP 127/83 | HR 81 | Temp 98.0°F | Resp 18 | Ht 65.0 in | Wt 173.0 lb

## 2015-08-29 DIAGNOSIS — Z85038 Personal history of other malignant neoplasm of large intestine: Secondary | ICD-10-CM

## 2015-08-29 DIAGNOSIS — D509 Iron deficiency anemia, unspecified: Secondary | ICD-10-CM | POA: Diagnosis not present

## 2015-08-29 DIAGNOSIS — I2699 Other pulmonary embolism without acute cor pulmonale: Secondary | ICD-10-CM

## 2015-08-29 DIAGNOSIS — Z86718 Personal history of other venous thrombosis and embolism: Secondary | ICD-10-CM

## 2015-08-29 DIAGNOSIS — C186 Malignant neoplasm of descending colon: Secondary | ICD-10-CM

## 2015-08-29 LAB — COMPREHENSIVE METABOLIC PANEL (CC13)
ALT: 15 U/L (ref 0–55)
AST: 16 U/L (ref 5–34)
Albumin: 4.1 g/dL (ref 3.5–5.0)
Alkaline Phosphatase: 67 U/L (ref 40–150)
Anion Gap: 6 mEq/L (ref 3–11)
BILIRUBIN TOTAL: 1.18 mg/dL (ref 0.20–1.20)
BUN: 11.3 mg/dL (ref 7.0–26.0)
CO2: 30 mEq/L — ABNORMAL HIGH (ref 22–29)
Calcium: 9.4 mg/dL (ref 8.4–10.4)
Chloride: 108 mEq/L (ref 98–109)
Creatinine: 0.8 mg/dL (ref 0.6–1.1)
EGFR: >90 mL/min/{1.73_m2} (ref >90)
GLUCOSE: 106 mg/dL (ref 70–140)
Potassium: 4.3 mEq/L (ref 3.5–5.1)
SODIUM: 144 meq/L (ref 136–145)
TOTAL PROTEIN: 6.8 g/dL (ref 6.4–8.3)

## 2015-08-29 LAB — IRON AND TIBC CHCC
%SAT: 38 % (ref 21–57)
Iron: 123 ug/dL (ref 41–142)
TIBC: 320 ug/dL (ref 236–444)
UIBC: 197 ug/dL (ref 120–384)

## 2015-08-29 LAB — CBC WITH DIFFERENTIAL (CANCER CENTER ONLY)
BASO#: 0 10*3/uL (ref 0.0–0.2)
BASO%: 0.6 % (ref 0.0–2.0)
EOS%: 3.9 % (ref 0.0–7.0)
Eosinophils Absolute: 0.1 10*3/uL (ref 0.0–0.5)
HEMATOCRIT: 38.1 % (ref 34.8–46.6)
HGB: 13.4 g/dL (ref 11.6–15.9)
LYMPH#: 1.3 10*3/uL (ref 0.9–3.3)
LYMPH%: 41.3 % (ref 14.0–48.0)
MCH: 31.7 pg (ref 26.0–34.0)
MCHC: 35.2 g/dL (ref 32.0–36.0)
MCV: 90 fL (ref 81–101)
MONO#: 0.2 10*3/uL (ref 0.1–0.9)
MONO%: 7.7 % (ref 0.0–13.0)
NEUT#: 1.4 10*3/uL — ABNORMAL LOW (ref 1.5–6.5)
NEUT%: 46.5 % (ref 39.6–80.0)
Platelets: 136 10*3/uL — ABNORMAL LOW (ref 145–400)
RBC: 4.23 10*6/uL (ref 3.70–5.32)
RDW: 12.4 % (ref 11.1–15.7)
WBC: 3.1 10*3/uL — ABNORMAL LOW (ref 3.9–10.0)

## 2015-08-29 LAB — FERRITIN CHCC: Ferritin: 323 ng/ml — ABNORMAL HIGH (ref 9–269)

## 2015-08-29 NOTE — Telephone Encounter (Addendum)
Patient aware of results  ----- Message from [name], MD sent at 08/29/2015 12:12 PM EDT ----- Please call and tell her that the iron level is okay. thamks

## 2015-08-29 NOTE — Progress Notes (Signed)
Hematology and Oncology Follow Up Visit  Mallory Morales [id number] 20-Nov-1956 59 y.o. 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 is back for followup. She had a great summer. She went over to [place]. She is on a cruise. She really enjoyed the cruise. She went to [place] and [place].  She's had no problem with nausea or vomiting. His been no abdominal pain. She's tried exercise. Her ways been holding steady.  She's had no bleeding.  She had a mammogram 2 months ago. Everything was fine and the mammogram.  She's had no rashes.  She's had no fever. There's been no cough. She's had no fatigue or weakness.   Overall, her performance status is ECOG 1  Medications:  Current outpatient prescriptions:  .  simvastatin (ZOCOR) 40 MG tablet, Take 40 mg by mouth every morning., Disp: , Rfl:  .  hydrochlorothiazide (HYDRODIURIL) 25 MG tablet, Take 25 mg by mouth every morning., Disp: , Rfl:   Allergies:  Allergies  Allergen Reactions  . Strawberry Swelling    Face only. Breathing not affected.    Past Medical History, Surgical history, Social history, and Family History were reviewed and updated.  Review of Systems: As above  Physical Exam:  height is 5\' 5"  (1.651 m) and weight is 173 lb (78.472 kg). Her oral temperature is 98 F (36.7 C). Her blood pressure is 127/83 and her pulse is 81. Her respiration is 18.   Well-developed and well-nourished African American female. Her abdominal exam shows a soft abdomen she has of abdominal wounds are well-healed. She has some keloid formation. She has no fluid wave. There is some slight guarding. She has no obvious liver or spleen tip. Lungs are clear. Cardiac exam regular rate and rhythm. She  has a 1/6 systolic murmur. Extremities shows no clubbing, cyanosis or edema. Back exam is shows no tenderness over the spine. No spasms noted in the paravertebral muscles. Skin exam shows no rashes, ecchymosis or petechia. Head and neck exam shows no ocular or oral lesions. She has no palpable cervical or supraclavicular lymph nodes.  Lab Results  Component Value Date   WBC 3.1* 08/29/2015   HGB 13.4 08/29/2015   HCT 38.1 08/29/2015   MCV 90 08/29/2015   PLT 136* 08/29/2015     Chemistry      Component Value Date/Time   NA 141 03/01/2015 1026   NA 144 06/30/2014 0842   NA 142 01/12/2014 0836   K 3.9 03/01/2015 1026   K 3.7 06/30/2014 0842   K 4.1 01/12/2014 0836   CL 104 03/01/2015 1026   CL 104 01/12/2014 0836   CO2 31 03/01/2015 1026   CO2 27 06/30/2014 0842   CO2 25 01/12/2014 0836   BUN 12 03/01/2015 1026   BUN 12.9 06/30/2014 0842   BUN 11 01/12/2014 0836   CREATININE 0.8 03/01/2015 1026   CREATININE 0.8 06/30/2014 0842   CREATININE 0.77 01/12/2014 0836      Component Value Date/Time   CALCIUM 9.4 03/01/2015 1026   CALCIUM 9.7 06/30/2014 0842   CALCIUM 9.0 01/12/2014 0836   ALKPHOS 62 03/01/2015 1026   ALKPHOS 61 06/30/2014 0842   ALKPHOS 55 01/12/2014 0836   AST 24  03/01/2015 1026   AST 18 06/30/2014 0842   AST 21 01/12/2014 0836   ALT 22 03/01/2015 1026   ALT 15 06/30/2014 0842   ALT 13 01/12/2014 0836   BILITOT 1.30 03/01/2015 1026   BILITOT 0.65 06/30/2014 0842   BILITOT 0.7 01/12/2014 0836         Impression and Plan: Mallory Morales is 59year-old Afro-American female. She has a history of stage II adenocarcinoma of the sigmoid colon. She had an Oncotype score of 20. She had 36 lymph nodes that were negative. She did not require any adjuvant therapy.  So far, everything looks okay. I think her risk of recurrence is probably less than 10%. I think that the fact that she had 36 negative lymph nodes is the most important prognostic factor for her.  I will  go ahead and plan to get her back in another 6 months . I don't think she needs any scans done unless we see some with her lab work.   [name], MD 9/7/20169:05 AM

## 2015-08-30 LAB — CEA: CEA: 4 ng/mL (ref 0.0–5.0)

## 2016-02-26 ENCOUNTER — Other Ambulatory Visit: Payer: Commercial Managed Care - HMO

## 2016-02-26 ENCOUNTER — Ambulatory Visit: Payer: Commercial Managed Care - HMO | Admitting: Hematology & Oncology

## 2016-03-12 ENCOUNTER — Encounter: Payer: Self-pay | Admitting: Hematology & Oncology

## 2016-03-12 ENCOUNTER — Ambulatory Visit (HOSPITAL_BASED_OUTPATIENT_CLINIC_OR_DEPARTMENT_OTHER): Payer: Commercial Managed Care - HMO | Admitting: Hematology & Oncology

## 2016-03-12 ENCOUNTER — Other Ambulatory Visit (HOSPITAL_BASED_OUTPATIENT_CLINIC_OR_DEPARTMENT_OTHER): Payer: Commercial Managed Care - HMO

## 2016-03-12 VITALS — BP 141/84 | HR 74 | Temp 98.0°F | Resp 16 | Ht 65.0 in | Wt 172.0 lb

## 2016-03-12 DIAGNOSIS — I2699 Other pulmonary embolism without acute cor pulmonale: Secondary | ICD-10-CM

## 2016-03-12 DIAGNOSIS — C186 Malignant neoplasm of descending colon: Secondary | ICD-10-CM

## 2016-03-12 DIAGNOSIS — Z85038 Personal history of other malignant neoplasm of large intestine: Secondary | ICD-10-CM

## 2016-03-12 DIAGNOSIS — D509 Iron deficiency anemia, unspecified: Secondary | ICD-10-CM

## 2016-03-12 LAB — COMPREHENSIVE METABOLIC PANEL
ALT: 14 U/L (ref 0–55)
ANION GAP: 9 meq/L (ref 3–11)
AST: 16 U/L (ref 5–34)
Albumin: 4.2 g/dL (ref 3.5–5.0)
Alkaline Phosphatase: 73 U/L (ref 40–150)
BILIRUBIN TOTAL: 0.94 mg/dL (ref 0.20–1.20)
BUN: 13.1 mg/dL (ref 7.0–26.0)
CO2: 29 meq/L (ref 22–29)
CREATININE: 0.8 mg/dL (ref 0.6–1.1)
Calcium: 9.6 mg/dL (ref 8.4–10.4)
Chloride: 105 mEq/L (ref 98–109)
EGFR: >90 mL/min/{1.73_m2} (ref >90)
GLUCOSE: 101 mg/dL (ref 70–140)
Potassium: 3.9 mEq/L (ref 3.5–5.1)
SODIUM: 143 meq/L (ref 136–145)
TOTAL PROTEIN: 7.5 g/dL (ref 6.4–8.3)

## 2016-03-12 LAB — CBC WITH DIFFERENTIAL (CANCER CENTER ONLY)
BASO#: 0 10*3/uL (ref 0.0–0.2)
BASO%: 0.6 % (ref 0.0–2.0)
EOS%: 2.5 % (ref 0.0–7.0)
Eosinophils Absolute: 0.1 10*3/uL (ref 0.0–0.5)
HCT: 39.5 % (ref 34.8–46.6)
HEMOGLOBIN: 13.9 g/dL (ref 11.6–15.9)
LYMPH#: 1.5 10*3/uL (ref 0.9–3.3)
LYMPH%: 47 % (ref 14.0–48.0)
MCH: 31.4 pg (ref 26.0–34.0)
MCHC: 35.2 g/dL (ref 32.0–36.0)
MCV: 89 fL (ref 81–101)
MONO#: 0.3 10*3/uL (ref 0.1–0.9)
MONO%: 8.7 % (ref 0.0–13.0)
NEUT%: 41.2 % (ref 39.6–80.0)
NEUTROS ABS: 1.3 10*3/uL — AB (ref 1.5–6.5)
PLATELETS: 132 10*3/uL — AB (ref 145–400)
RBC: 4.42 10*6/uL (ref 3.70–5.32)
RDW: 12.6 % (ref 11.1–15.7)
WBC: 3.2 10*3/uL — AB (ref 3.9–10.0)

## 2016-03-12 LAB — IRON AND TIBC
%SAT: 40 % (ref 21–57)
IRON: 127 ug/dL (ref 41–142)
TIBC: 316 ug/dL (ref 236–444)
UIBC: 189 ug/dL (ref 120–384)

## 2016-03-12 LAB — FERRITIN: Ferritin: 463 ng/ml — ABNORMAL HIGH (ref 9–269)

## 2016-03-12 NOTE — Progress Notes (Signed)
Hematology and Oncology Follow Up Visit  Mallory Morales [id number] [birth year] 60 y.o. 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 is back for followup. She is doing quite well. She went on another cruise this past February. She will down to the [place]. She had a great time.  She is still working. She enjoys work. She really wants to keep working.  She is planning on another cruise in May 2018 to [place].  She's had no problems with bowels or bladder. She's had no weight loss or weight gain. She wants to try to lose a little bit of weight.  She's had no cough or shortness of breath. There's been no problem with leg swelling. She's had no obvious bleeding.  She's had no nausea or vomiting.  I will had finally her last mammogram was.  Overall, her performance status is ECOG 1  Medications:  Current outpatient prescriptions:  .  hydrochlorothiazide (HYDRODIURIL) 25 MG tablet, , Disp: , Rfl:  .  simvastatin (ZOCOR) 40 MG tablet, Take 40 mg by mouth every morning., Disp: , Rfl:  .  hydrochlorothiazide (HYDRODIURIL) 25 MG tablet, Take 25 mg by mouth every morning., Disp: , Rfl:   Allergies:  Allergies  Allergen Reactions  . Strawberry Extract Swelling    Face only. Breathing not affected.    Past Medical History, Surgical history, Social history, and Family History were reviewed and updated.  Review of Systems: As above  Physical Exam:  height is 5\' 5"  (1.651 m) and weight is 172 lb (78.019 kg). Her oral temperature is 98 F (36.7 C). Her blood pressure is 141/84 and her pulse is 74. Her respiration is 16.   Well-developed and well-nourished African American female. Her abdominal exam shows a soft abdomen she has of  abdominal wounds are well-healed. She has some keloid formation. She has no fluid wave. There is some slight guarding. She has no obvious liver or spleen tip. Lungs are clear. Cardiac exam regular rate and rhythm. She has a 1/6 systolic murmur. Extremities shows no clubbing, cyanosis or edema. Back exam is shows no tenderness over the spine. No spasms noted in the paravertebral muscles. Skin exam shows no rashes, ecchymosis or petechia. Head and neck exam shows no ocular or oral lesions. She has no palpable cervical or supraclavicular lymph nodes.  Lab Results  Component Value Date   WBC 3.2* 03/12/2016   HGB 13.9 03/12/2016   HCT 39.5 03/12/2016   MCV 89 03/12/2016   PLT 132* 03/12/2016     Chemistry      Component Value Date/Time   NA 144 08/29/2015 0832   NA 141 03/01/2015 1026   NA 142 01/12/2014 0836   K 4.3 08/29/2015 0832   K 3.9 03/01/2015 1026   K 4.1 01/12/2014 0836   CL 104 03/01/2015 1026   CL 104 01/12/2014 0836   CO2 30* 08/29/2015 0832   CO2 31 03/01/2015 1026   CO2 25 01/12/2014 0836   BUN 11.3 08/29/2015 0832   BUN 12 03/01/2015 1026   BUN 11 01/12/2014 0836   CREATININE 0.8 08/29/2015 0832   CREATININE 0.8 03/01/2015 1026   CREATININE 0.77 01/12/2014 0836      Component Value  Date/Time   CALCIUM 9.4 08/29/2015 0832   CALCIUM 9.4 03/01/2015 1026   CALCIUM 9.0 01/12/2014 0836   ALKPHOS 67 08/29/2015 0832   ALKPHOS 62 03/01/2015 1026   ALKPHOS 55 01/12/2014 0836   AST 16 08/29/2015 0832   AST 24 03/01/2015 1026   AST 21 01/12/2014 0836   ALT 15 08/29/2015 0832   ALT 22 03/01/2015 1026   ALT 13 01/12/2014 0836   BILITOT 1.18 08/29/2015 0832   BILITOT 1.30 03/01/2015 1026   BILITOT 0.7 01/12/2014 0836         Impression and Plan: Mallory Morales is 60 year-old Afro-American female. She has a history of stage II adenocarcinoma of the sigmoid colon. She had an Oncotype score of 20. She had 36 lymph nodes that were negative. She has surgery back in January  2013. She did not require any adjuvant therapy.  So far, everything looks okay. I think her risk of recurrence is probably less than 10%. I think that the fact that she had 36 negative lymph nodes is the most important prognostic factor for her.  I will go ahead and plan to get her back in another 6 months . I don't think she needs any scans done unless we see some with her lab work.  Once we get to the 5 year mark, then we can let her go once year.   [name], MD 3/22/20178:57 AM

## 2016-03-13 ENCOUNTER — Encounter: Payer: Self-pay | Admitting: Nurse Practitioner

## 2016-03-13 LAB — CEA: CEA: 8.1 ng/mL (ref 0.0–4.7)

## 2016-03-13 LAB — CEA (PARALLEL TESTING): CEA: 5 ng/mL — ABNORMAL HIGH

## 2016-07-07 DIAGNOSIS — Z1231 Encounter for screening mammogram for malignant neoplasm of breast: Secondary | ICD-10-CM | POA: Diagnosis not present

## 2016-07-07 DIAGNOSIS — Z803 Family history of malignant neoplasm of breast: Secondary | ICD-10-CM | POA: Diagnosis not present

## 2016-07-18 DIAGNOSIS — E7800 Pure hypercholesterolemia, unspecified: Secondary | ICD-10-CM | POA: Diagnosis not present

## 2016-07-18 DIAGNOSIS — R7303 Prediabetes: Secondary | ICD-10-CM | POA: Diagnosis not present

## 2016-07-18 DIAGNOSIS — I10 Essential (primary) hypertension: Secondary | ICD-10-CM | POA: Diagnosis not present

## 2016-07-18 DIAGNOSIS — Z85038 Personal history of other malignant neoplasm of large intestine: Secondary | ICD-10-CM | POA: Diagnosis not present

## 2016-09-12 ENCOUNTER — Other Ambulatory Visit (HOSPITAL_BASED_OUTPATIENT_CLINIC_OR_DEPARTMENT_OTHER): Payer: BLUE CROSS/BLUE SHIELD

## 2016-09-12 ENCOUNTER — Ambulatory Visit (HOSPITAL_BASED_OUTPATIENT_CLINIC_OR_DEPARTMENT_OTHER): Payer: BLUE CROSS/BLUE SHIELD | Admitting: Hematology & Oncology

## 2016-09-12 ENCOUNTER — Encounter: Payer: Self-pay | Admitting: *Deleted

## 2016-09-12 ENCOUNTER — Encounter: Payer: Self-pay | Admitting: Hematology & Oncology

## 2016-09-12 VITALS — BP 131/78 | HR 79 | Temp 97.9°F | Resp 16 | Ht 65.0 in | Wt 178.8 lb

## 2016-09-12 DIAGNOSIS — D509 Iron deficiency anemia, unspecified: Secondary | ICD-10-CM | POA: Diagnosis not present

## 2016-09-12 DIAGNOSIS — C186 Malignant neoplasm of descending colon: Secondary | ICD-10-CM

## 2016-09-12 DIAGNOSIS — I2699 Other pulmonary embolism without acute cor pulmonale: Secondary | ICD-10-CM

## 2016-09-12 DIAGNOSIS — Z86718 Personal history of other venous thrombosis and embolism: Secondary | ICD-10-CM

## 2016-09-12 DIAGNOSIS — Z85038 Personal history of other malignant neoplasm of large intestine: Secondary | ICD-10-CM | POA: Diagnosis not present

## 2016-09-12 LAB — FERRITIN: Ferritin: 289 ng/ml — ABNORMAL HIGH (ref 9–269)

## 2016-09-12 LAB — CEA (IN HOUSE-CHCC): CEA (CHCC-IN HOUSE): 5.05 ng/mL — AB (ref 0.00–5.00)

## 2016-09-12 LAB — COMPREHENSIVE METABOLIC PANEL
ALK PHOS: 73 U/L (ref 40–150)
ALT: 18 U/L (ref 0–55)
ANION GAP: 11 meq/L (ref 3–11)
AST: 17 U/L (ref 5–34)
Albumin: 3.9 g/dL (ref 3.5–5.0)
BUN: 11.9 mg/dL (ref 7.0–26.0)
CALCIUM: 9.6 mg/dL (ref 8.4–10.4)
CHLORIDE: 104 meq/L (ref 98–109)
CO2: 27 mEq/L (ref 22–29)
CREATININE: 0.9 mg/dL (ref 0.6–1.1)
EGFR: 88 mL/min/{1.73_m2} — ABNORMAL LOW (ref >90)
Glucose: 114 mg/dl (ref 70–140)
POTASSIUM: 3.7 meq/L (ref 3.5–5.1)
Sodium: 143 mEq/L (ref 136–145)
Total Bilirubin: 1.15 mg/dL (ref 0.20–1.20)
Total Protein: 7.1 g/dL (ref 6.4–8.3)

## 2016-09-12 LAB — CBC WITH DIFFERENTIAL (CANCER CENTER ONLY)
BASO#: 0 10*3/uL (ref 0.0–0.2)
BASO%: 0.6 % (ref 0.0–2.0)
EOS ABS: 0.1 10*3/uL (ref 0.0–0.5)
EOS%: 2.2 % (ref 0.0–7.0)
HEMATOCRIT: 36.7 % (ref 34.8–46.6)
HEMOGLOBIN: 13.4 g/dL (ref 11.6–15.9)
LYMPH#: 1.5 10*3/uL (ref 0.9–3.3)
LYMPH%: 41.6 % (ref 14.0–48.0)
MCH: 32 pg (ref 26.0–34.0)
MCHC: 36.5 g/dL — AB (ref 32.0–36.0)
MCV: 88 fL (ref 81–101)
MONO#: 0.2 10*3/uL (ref 0.1–0.9)
MONO%: 6.4 % (ref 0.0–13.0)
NEUT%: 49.2 % (ref 39.6–80.0)
NEUTROS ABS: 1.8 10*3/uL (ref 1.5–6.5)
Platelets: 133 10*3/uL — ABNORMAL LOW (ref 145–400)
RBC: 4.19 10*6/uL (ref 3.70–5.32)
RDW: 12.4 % (ref 11.1–15.7)
WBC: 3.6 10*3/uL — ABNORMAL LOW (ref 3.9–10.0)

## 2016-09-12 LAB — IRON AND TIBC
%SAT: 31 % (ref 21–57)
Iron: 105 ug/dL (ref 41–142)
TIBC: 339 ug/dL (ref 236–444)
UIBC: 234 ug/dL (ref 120–384)
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
## 2016-09-13 ENCOUNTER — Ambulatory Visit (HOSPITAL_BASED_OUTPATIENT_CLINIC_OR_DEPARTMENT_OTHER)
Admission: RE | Admit: 2016-09-13 | Discharge: 2016-09-13 | Disposition: A | Discharge status: Home / Self Care | Payer: BLUE CROSS/BLUE SHIELD | Source: Ambulatory Visit | Attending: Hematology & Oncology | Admitting: Hematology & Oncology

## 2016-09-13 DIAGNOSIS — R1012 Left upper quadrant pain: Secondary | ICD-10-CM | POA: Insufficient documentation

## 2016-09-13 DIAGNOSIS — C189 Malignant neoplasm of colon, unspecified: Secondary | ICD-10-CM | POA: Diagnosis not present

## 2016-09-13 DIAGNOSIS — C186 Malignant neoplasm of descending colon: Secondary | ICD-10-CM | POA: Insufficient documentation

## 2016-09-13 LAB — RETICULOCYTES: RETICULOCYTE COUNT: 1.7 % (ref 0.6–2.6)

## 2016-09-13 LAB — CEA: CEA: 5 ng/mL — ABNORMAL HIGH (ref 0.0–4.7)

## 2016-09-13 MED ORDER — IOPAMIDOL (ISOVUE-300) INJECTION 61%
100.0000 mL | Freq: Once | INTRAVENOUS | Status: AC | PRN
  Administered 2016-09-13: 100 mL via INTRAVENOUS

## 2016-09-15 ENCOUNTER — Telehealth: Payer: Self-pay | Admitting: *Deleted

## 2016-09-15 NOTE — Telephone Encounter (Addendum)
Patient aware of results.   ----- Message from [name], MD sent at 09/15/2016  7:28 AM EDT ----- Call - No evidence of any cancer!!  You do have some constipation!!!  pete

## 2016-09-17 ENCOUNTER — Telehealth: Payer: Self-pay | Admitting: *Deleted

## 2016-09-17 NOTE — Telephone Encounter (Addendum)
Patient aware of results.  ----- Message from [name], MD sent at 09/17/2016  2:21 PM EDT ----- Call - iron level is still ok!!  Mallory Morales

## 2016-12-13 ENCOUNTER — Encounter (HOSPITAL_BASED_OUTPATIENT_CLINIC_OR_DEPARTMENT_OTHER): Payer: Self-pay | Admitting: Emergency Medicine

## 2016-12-13 ENCOUNTER — Inpatient Hospital Stay (HOSPITAL_BASED_OUTPATIENT_CLINIC_OR_DEPARTMENT_OTHER)
Admission: EM | Admit: 2016-12-13 | Discharge: 2016-12-24 | DRG: 558 | Disposition: A | Discharge status: Home / Self Care | Payer: BLUE CROSS/BLUE SHIELD | Attending: Family Medicine | Admitting: Family Medicine

## 2016-12-13 DIAGNOSIS — Z85038 Personal history of other malignant neoplasm of large intestine: Secondary | ICD-10-CM

## 2016-12-13 DIAGNOSIS — Z86718 Personal history of other venous thrombosis and embolism: Secondary | ICD-10-CM

## 2016-12-13 DIAGNOSIS — M7989 Other specified soft tissue disorders: Secondary | ICD-10-CM | POA: Diagnosis not present

## 2016-12-13 DIAGNOSIS — R945 Abnormal results of liver function studies: Secondary | ICD-10-CM | POA: Diagnosis not present

## 2016-12-13 DIAGNOSIS — M609 Myositis, unspecified: Secondary | ICD-10-CM | POA: Diagnosis not present

## 2016-12-13 DIAGNOSIS — R059 Cough, unspecified: Secondary | ICD-10-CM

## 2016-12-13 DIAGNOSIS — Z86711 Personal history of pulmonary embolism: Secondary | ICD-10-CM | POA: Diagnosis not present

## 2016-12-13 DIAGNOSIS — C186 Malignant neoplasm of descending colon: Secondary | ICD-10-CM | POA: Diagnosis present

## 2016-12-13 DIAGNOSIS — K5900 Constipation, unspecified: Secondary | ICD-10-CM | POA: Diagnosis not present

## 2016-12-13 DIAGNOSIS — E785 Hyperlipidemia, unspecified: Secondary | ICD-10-CM | POA: Diagnosis present

## 2016-12-13 DIAGNOSIS — I10 Essential (primary) hypertension: Secondary | ICD-10-CM | POA: Diagnosis not present

## 2016-12-13 DIAGNOSIS — Z79899 Other long term (current) drug therapy: Secondary | ICD-10-CM | POA: Diagnosis not present

## 2016-12-13 DIAGNOSIS — E876 Hypokalemia: Secondary | ICD-10-CM

## 2016-12-13 DIAGNOSIS — M79662 Pain in left lower leg: Secondary | ICD-10-CM | POA: Diagnosis not present

## 2016-12-13 DIAGNOSIS — Z7901 Long term (current) use of anticoagulants: Secondary | ICD-10-CM | POA: Diagnosis not present

## 2016-12-13 DIAGNOSIS — R7989 Other specified abnormal findings of blood chemistry: Secondary | ICD-10-CM

## 2016-12-13 DIAGNOSIS — I82621 Acute embolism and thrombosis of deep veins of right upper extremity: Secondary | ICD-10-CM | POA: Diagnosis present

## 2016-12-13 DIAGNOSIS — Z8711 Personal history of peptic ulcer disease: Secondary | ICD-10-CM

## 2016-12-13 DIAGNOSIS — D696 Thrombocytopenia, unspecified: Secondary | ICD-10-CM | POA: Diagnosis present

## 2016-12-13 DIAGNOSIS — T466X5A Adverse effect of antihyperlipidemic and antiarteriosclerotic drugs, initial encounter: Secondary | ICD-10-CM

## 2016-12-13 DIAGNOSIS — M6282 Rhabdomyolysis: Secondary | ICD-10-CM

## 2016-12-13 DIAGNOSIS — Z87891 Personal history of nicotine dependence: Secondary | ICD-10-CM | POA: Diagnosis not present

## 2016-12-13 DIAGNOSIS — M79609 Pain in unspecified limb: Secondary | ICD-10-CM | POA: Diagnosis not present

## 2016-12-13 DIAGNOSIS — M79661 Pain in right lower leg: Secondary | ICD-10-CM | POA: Diagnosis not present

## 2016-12-13 DIAGNOSIS — R05 Cough: Secondary | ICD-10-CM | POA: Diagnosis not present

## 2016-12-13 DIAGNOSIS — M79669 Pain in unspecified lower leg: Secondary | ICD-10-CM | POA: Diagnosis not present

## 2016-12-13 DIAGNOSIS — R52 Pain, unspecified: Secondary | ICD-10-CM | POA: Diagnosis not present

## 2016-12-13 DIAGNOSIS — R740 Nonspecific elevation of levels of transaminase and lactic acid dehydrogenase [LDH]: Secondary | ICD-10-CM | POA: Diagnosis not present

## 2016-12-13 LAB — CBC WITH DIFFERENTIAL/PLATELET
BASOS ABS: 0 10*3/uL (ref 0.0–0.1)
Basophils Relative: 0 %
EOS ABS: 0 10*3/uL (ref 0.0–0.7)
EOS PCT: 1 %
HCT: 41.4 % (ref 36.0–46.0)
HEMOGLOBIN: 15 g/dL (ref 12.0–15.0)
LYMPHS PCT: 24 %
Lymphs Abs: 1 10*3/uL (ref 0.7–4.0)
MCH: 31.5 pg (ref 26.0–34.0)
MCHC: 36.2 g/dL — ABNORMAL HIGH (ref 30.0–36.0)
MCV: 87 fL (ref 78.0–100.0)
Monocytes Absolute: 0.4 10*3/uL (ref 0.1–1.0)
Monocytes Relative: 9 %
NEUTROS PCT: 66 %
Neutro Abs: 2.7 10*3/uL (ref 1.7–7.7)
PLATELETS: 128 10*3/uL — AB (ref 150–400)
RBC: 4.76 MIL/uL (ref 3.87–5.11)
RDW: 12.2 % (ref 11.5–15.5)
WBC: 4.1 10*3/uL (ref 4.0–10.5)

## 2016-12-13 LAB — BASIC METABOLIC PANEL
ANION GAP: 11 (ref 5–15)
BUN: 12 mg/dL (ref 6–20)
CHLORIDE: 100 mmol/L — AB (ref 101–111)
CO2: 27 mmol/L (ref 22–32)
Calcium: 8.9 mg/dL (ref 8.9–10.3)
Creatinine, Ser: 0.91 mg/dL (ref 0.44–1.00)
GFR calc Af Amer: >60 mL/min (ref >60)
Glucose, Bld: 154 mg/dL — ABNORMAL HIGH (ref 65–99)
POTASSIUM: 3.1 mmol/L — AB (ref 3.5–5.1)
SODIUM: 138 mmol/L (ref 135–145)

## 2016-12-13 LAB — CK

## 2016-12-13 LAB — URINALYSIS, ROUTINE W REFLEX MICROSCOPIC
Bilirubin Urine: NEGATIVE
Glucose, UA: NEGATIVE mg/dL
KETONES UR: NEGATIVE mg/dL
NITRITE: NEGATIVE
PROTEIN: NEGATIVE mg/dL
Specific Gravity, Urine: 1.004 — ABNORMAL LOW (ref 1.005–1.030)
pH: 7 (ref 5.0–8.0)

## 2016-12-13 LAB — URINALYSIS, MICROSCOPIC (REFLEX)

## 2016-12-13 MED ORDER — SODIUM BICARBONATE 8.4 % IV SOLN
INTRAVENOUS | Status: AC
  Filled 2016-12-13: qty 150

## 2016-12-13 MED ORDER — SODIUM BICARBONATE 8.4 % IV SOLN
INTRAVENOUS | Status: DC
  Administered 2016-12-13 – 2016-12-15 (×4): via INTRAVENOUS
  Filled 2016-12-13 (×8): qty 150

## 2016-12-13 MED ORDER — SODIUM CHLORIDE 0.9 % IV BOLUS (SEPSIS)
1000.0000 mL | Freq: Once | INTRAVENOUS | Status: AC
  Administered 2016-12-13: 1000 mL via INTRAVENOUS

## 2016-12-13 MED ORDER — POTASSIUM CHLORIDE CRYS ER 20 MEQ PO TBCR
40.0000 meq | EXTENDED_RELEASE_TABLET | Freq: Once | ORAL | Status: AC
  Administered 2016-12-13: 40 meq via ORAL
  Filled 2016-12-13: qty 2

## 2016-12-13 MED ORDER — STERILE WATER FOR INJECTION IV SOLN
INTRAVENOUS | Status: DC
  Filled 2016-12-13: qty 850

## 2016-12-13 NOTE — ED Triage Notes (Signed)
Patient states that she has had pain in her back and her kidneys, legs and arms with muscle aches. The patient reports that she is also having weakness and SOB, she feels like her iron is low

## 2016-12-13 NOTE — ED Provider Notes (Signed)
[facility] DEPT [facility] Provider Note   CSN: [id number] Arrival date & time: 12/13/16  1711  By signing my name below, I, [name], attest that this documentation has been prepared under the direction and in the presence of Leo Grosser, MD . Electronically Signed: [name], Scribe. 12/13/2016. 7:09 PM.  History   Chief Complaint Chief Complaint  Patient presents with  . Generalized Body Aches   The history is provided by the patient. No language interpreter was used.   HPI Comments: Mallory Morales is a 60 y.o. female who presents to the Emergency Department complaining of constant moderate BLE pain that started this morning. She states she had difficultly ambulating today due to pain. She is also concerned about her history of anemia. She reports that movement exacerbates the pain. She reports associated symptoms of LUQ abdominal pain, back pain. BLE pain, dizziness, and a metallic taste in her mouth. She reports that her myalgias started about 6 months with worsening of her lower extremity complaints in the last few days. She had localized colon cancer that has been resolved after surgery. She denies any other complaints.   PCP: Simona Huh, MD  Past Medical History:  Diagnosis Date  . Anemia, iron deficiency 01/27/2012  . Clotting disorder ([place])   . Colon cancer ([place])    T3N0M0, emergency surgery/with colostomy done-no further tx.  . Complication of anesthesia    "hard time getting breath" post op  . DVT (deep venous thrombosis) ([place])   . Hyperlipemia   . Hypertension   . Pulmonary embolism ([facility])    taking injections -last done 1'14(Arixtra)    Patient Active Problem List   Diagnosis Date Noted  . Lower GI bleeding 03/28/2013  . 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 Anemia, iron deficiency 01/27/2012  . Femoral DVT (deep venous thrombosis) ([place]) 01/23/2012  . PE (pulmonary embolism) 01/22/2012  . Cancer of descending colon ([place]) 01/17/2012    . Hypertension 01/15/2012  . Dyslipidemia 01/15/2012    Past Surgical History:  Procedure Laterality Date  . ABDOMINAL HYSTERECTOMY    . COLON RESECTION  01/14/2012   Procedure: COLON RESECTION;  Surgeon: Odis Hollingshead, MD;  Location: [facility];  Service: General;  Laterality: N/A;  left colectomy, mobilization of splenic flexure, ostomy.  . COLOSTOMY  01/14/2012  . COLOSTOMY TAKEDOWN N/A 02/24/2013   Procedure: Laparoscopic Assisted Colostomy Closure;  Surgeon: Odis Hollingshead, MD;  Location: [facility];  Service: General;  Laterality: N/A;  Laparoscopic Assisted Colostomy Closure  . FLEXIBLE SIGMOIDOSCOPY N/A 03/17/2014   Procedure: FLEXIBLE SIGMOIDOSCOPY;  Surgeon: Beryle Beams, MD;  Location: [facility];  Service: Endoscopy;  Laterality: N/A;  . FLEXIBLE SIGMOIDOSCOPY N/A 04/07/2014   Procedure: FLEXIBLE SIGMOIDOSCOPY;  Surgeon: Beryle Beams, MD;  Location: [facility];  Service: Endoscopy;  Laterality: N/A;  no sedation  . HERNIA REPAIR    . HOT HEMOSTASIS N/A 03/17/2014   Procedure: HOT HEMOSTASIS (ARGON PLASMA COAGULATION/BICAP);  Surgeon: Beryle Beams, MD;  Location: [facility];  Service: Endoscopy;  Laterality: N/A;  . HOT HEMOSTASIS N/A 04/07/2014   Procedure: HOT HEMOSTASIS (ARGON PLASMA COAGULATION/BICAP);  Surgeon: Beryle Beams, MD;  Location: [facility];  Service: Endoscopy;  Laterality: N/A;    OB History    No data available       Home Medications    Prior to Admission medications   Medication Sig Start Date End Date Taking? Authorizing Provider  hydrochlorothiazide (HYDRODIURIL) 25 MG tablet Take  25 mg by mouth every morning. 01/27/12 03/01/15  Saverio Danker, PA-C  hydrochlorothiazide (HYDRODIURIL) 25 MG tablet  02/04/16   Historical Provider, MD  simvastatin (ZOCOR) 40 MG tablet Take 40 mg by mouth every morning. 01/27/12   Saverio Danker, PA-C    Family History Family History  Problem Relation Age of Onset  . Heart attack Father   . Hypertension Father    . Heart disease Father   . Stomach cancer Sister   . Pancreatic cancer Brother   . Cancer Brother     pancreatic    Social History Social History  Substance Use Topics  . Smoking status: Former Smoker    Packs/day: 0.50    Years: 11.00    Types: Cigarettes    Start date: 08/26/1974    Quit date: 05/16/1985  . Smokeless tobacco: Never Used     Comment: quit 28 years ago  . Alcohol use 0.6 oz/week    1 Glasses of wine per week     Comment: Occ. wine     Allergies   Strawberry extract   Review of Systems Review of Systems  Gastrointestinal: Positive for abdominal pain.  Musculoskeletal: Positive for back pain and myalgias (BLE).  Neurological: Positive for dizziness.  All other systems reviewed and are negative.  Physical Exam Updated Vital Signs BP (!) 152/102 (BP Location: Right Arm)   Pulse 102   Temp 98.8 F (37.1 C) (Oral)   Resp 18   Ht 5\' 5"  (1.651 m)   Wt 178 lb (80.7 kg)   SpO2 100%   BMI 29.62 kg/m   Physical Exam  Constitutional: She is oriented to person, place, and time. She appears well-developed and well-nourished. No distress.  HENT:  Head: Normocephalic.  Nose: Nose normal.  Eyes: Conjunctivae are normal.  Neck: Neck supple. No tracheal deviation present.  Cardiovascular: Normal rate and regular rhythm.   Pulmonary/Chest: Effort normal. No respiratory distress.  Abdominal: Soft. She exhibits no distension.  Musculoskeletal:  Bilateral thigh tenderness, normal range of motion, able to perform simple calisthenic measures  Neurological: She is alert and oriented to person, place, and time.  Skin: Skin is warm and dry.  Psychiatric: She has a normal mood and affect.     ED Treatments / Results  DIAGNOSTIC STUDIES: Oxygen Saturation is 100% on RA, normal by my interpretation.    COORDINATION OF CARE: 7:13 PM- Pt advised of plan for treatment and pt agrees.  Labs (all labs ordered are listed, but only abnormal results are  displayed) Labs Reviewed  CBC WITH DIFFERENTIAL/PLATELET - Abnormal; Notable for the following:       Result Value   MCHC 36.2 (*)    Platelets 128 (*)    All other components within normal limits  BASIC METABOLIC PANEL - Abnormal; Notable for the following:    Potassium 3.1 (*)    Chloride 100 (*)    Glucose, Bld 154 (*)    All other components within normal limits  CK - Abnormal; Notable for the following:    Total CK >50,000 (*)    All other components within normal limits  URINALYSIS, ROUTINE W REFLEX MICROSCOPIC    EKG  EKG Interpretation None       Radiology No results found.  Procedures Procedures (including critical care time)  Medications Ordered in ED Medications  sodium bicarbonate 150 mEq in sterile water 1,000 mL infusion (not administered)  sodium bicarbonate 1 mEq/mL injection (not administered)  sodium chloride 0.9 %  bolus 1,000 mL (0 mLs Intravenous Stopped 12/13/16 2152)    And  sodium chloride 0.9 % bolus 1,000 mL (1,000 mLs Intravenous New Bag/Given 12/13/16 2152)  potassium chloride SA (K-DUR,KLOR-CON) CR tablet 40 mEq (40 mEq Oral Given 12/13/16 2143)    Initial Impression / Assessment and Plan / ED Course  I have reviewed the triage vital signs and the nursing notes.  Pertinent labs & imaging results that were available during my care of the patient were reviewed by me and considered in my medical decision making (see chart for details).  Clinical Course    60 year old female with history of hypertension, hyperlipidemia presents with bilateral thigh pain that has worsened over the last 2 days. She has had several weeks of low back pain that predated this. She is well-appearing but does have some leg stiffness and tenderness on exam. Screening CK was ordered which was incalculably high suggesting acute nontraumatic rhabdomyolysis that in this setting is best explained by statin use. No signs of proteinuria, acute kidney injury or other major  complication is evident at this time except for pain. Discussed with the hospitalist at [facility] who accepted the patient transfer to a facility capable of continuing the patient's care. 2 L of crystalloid were initiated and after discussion with internal medicine she will be started on a sodium bicarbonate infusion to alkalinize the urine as a nephroprotective measure. Mild hypokalemia may be contributing so she was started on potassium supplementation by mouth prior to transfer.  Final Clinical Impressions(s) / ED Diagnoses   Final diagnoses:  Non-traumatic rhabdomyolysis  Complication of statin therapy, initial encounter  Hypokalemia    New Prescriptions New Prescriptions   No medications on file   I personally performed the services described in this documentation, which was scribed in my presence. The recorded information has been reviewed and is accurate.     Leo Grosser, MD 12/13/16 [phone]

## 2016-12-13 NOTE — Progress Notes (Signed)
60 yo F hx of HTN and local colon cancer s/p resection w/o recurrence p/w diffuse body aches for several weeks now with bilateral thigh pain.  CK >50K, does take simvastatin. Cr 0.8  BP 131/86   Pulse 91   Temp 98.8 F (37.1 C) (Oral)   Resp 20   Ht 5\' 5"  (1.651 m)   Wt 80.7 kg (178 lb)   SpO2 98%   BMI 29.62 kg/m    Will start bicarb drip, transfer to med surg bed.

## 2016-12-14 ENCOUNTER — Inpatient Hospital Stay (HOSPITAL_COMMUNITY): Payer: BLUE CROSS/BLUE SHIELD

## 2016-12-14 DIAGNOSIS — Z86718 Personal history of other venous thrombosis and embolism: Secondary | ICD-10-CM

## 2016-12-14 LAB — LIPID PANEL
CHOLESTEROL: 160 mg/dL (ref 0–200)
HDL: 49 mg/dL (ref >40)
LDL Cholesterol: 93 mg/dL (ref 0–99)
Total CHOL/HDL Ratio: 3.3 RATIO
Triglycerides: 92 mg/dL (ref <150)
VLDL: 18 mg/dL (ref 0–40)

## 2016-12-14 LAB — CBC WITH DIFFERENTIAL/PLATELET
BASOS ABS: 0 10*3/uL (ref 0.0–0.1)
Basophils Relative: 0 %
EOS ABS: 0.1 10*3/uL (ref 0.0–0.7)
Eosinophils Relative: 2 %
HCT: 36.9 % (ref 36.0–46.0)
Hemoglobin: 13.1 g/dL (ref 12.0–15.0)
LYMPHS ABS: 1.3 10*3/uL (ref 0.7–4.0)
LYMPHS PCT: 47 %
MCH: 30.5 pg (ref 26.0–34.0)
MCHC: 35.5 g/dL (ref 30.0–36.0)
MCV: 85.8 fL (ref 78.0–100.0)
Monocytes Absolute: 0.2 10*3/uL (ref 0.1–1.0)
Monocytes Relative: 7 %
NEUTROS ABS: 1.2 10*3/uL — AB (ref 1.7–7.7)
Neutrophils Relative %: 44 %
PLATELETS: 114 10*3/uL — AB (ref 150–400)
RBC: 4.3 MIL/uL (ref 3.87–5.11)
RDW: 12.2 % (ref 11.5–15.5)
WBC: 2.8 10*3/uL — ABNORMAL LOW (ref 4.0–10.5)

## 2016-12-14 LAB — COMPREHENSIVE METABOLIC PANEL
ALBUMIN: 3.2 g/dL — AB (ref 3.5–5.0)
ALK PHOS: 48 U/L (ref 38–126)
ALT: 80 U/L — ABNORMAL HIGH (ref 14–54)
ANION GAP: 10 (ref 5–15)
AST: 222 U/L — ABNORMAL HIGH (ref 15–41)
BILIRUBIN TOTAL: 0.8 mg/dL (ref 0.3–1.2)
BUN: 6 mg/dL (ref 6–20)
CALCIUM: 8.5 mg/dL — AB (ref 8.9–10.3)
CO2: 30 mmol/L (ref 22–32)
CREATININE: 0.7 mg/dL (ref 0.44–1.00)
Chloride: 101 mmol/L (ref 101–111)
GFR calc Af Amer: >60 mL/min (ref >60)
GFR calc non Af Amer: >60 mL/min (ref >60)
GLUCOSE: 126 mg/dL — AB (ref 65–99)
Potassium: 3.1 mmol/L — ABNORMAL LOW (ref 3.5–5.1)
Sodium: 141 mmol/L (ref 135–145)
TOTAL PROTEIN: 5.6 g/dL — AB (ref 6.5–8.1)

## 2016-12-14 LAB — CK: Total CK: >50000 U/L — ABNORMAL HIGH (ref 38–234)

## 2016-12-14 LAB — SAVE SMEAR

## 2016-12-14 MED ORDER — HYDROCODONE-ACETAMINOPHEN 5-325 MG PO TABS: 1.0000 | ORAL_TABLET | Freq: Four times a day (QID) | ORAL | Status: DC | PRN

## 2016-12-14 MED ORDER — BENZONATATE 100 MG PO CAPS
100.0000 mg | ORAL_CAPSULE | Freq: Once | ORAL | Status: AC
  Administered 2016-12-14: 100 mg via ORAL
  Filled 2016-12-14: qty 1

## 2016-12-14 MED ORDER — BENZONATATE 100 MG PO CAPS
200.0000 mg | ORAL_CAPSULE | Freq: Three times a day (TID) | ORAL | Status: DC | PRN
  Administered 2016-12-14: 200 mg via ORAL
  Filled 2016-12-14: qty 2

## 2016-12-14 MED ORDER — ALBUTEROL SULFATE (2.5 MG/3ML) 0.083% IN NEBU: 2.5000 mg | INHALATION_SOLUTION | RESPIRATORY_TRACT | Status: DC | PRN

## 2016-12-14 MED ORDER — POLYETHYLENE GLYCOL 3350 17 G PO PACK
17.0000 g | PACK | Freq: Every day | ORAL | Status: DC
  Administered 2016-12-15 – 2016-12-24 (×10): 17 g via ORAL
  Filled 2016-12-14 (×11): qty 1

## 2016-12-14 MED ORDER — HEPARIN SODIUM (PORCINE) 5000 UNIT/ML IJ SOLN
5000.0000 [IU] | Freq: Three times a day (TID) | INTRAMUSCULAR | Status: DC
  Administered 2016-12-14 – 2016-12-15 (×3): 5000 [IU] via SUBCUTANEOUS
  Filled 2016-12-14 (×3): qty 1

## 2016-12-14 MED ORDER — ONDANSETRON HCL 4 MG/2ML IJ SOLN: 4.0000 mg | Freq: Four times a day (QID) | INTRAMUSCULAR | Status: DC | PRN

## 2016-12-14 MED ORDER — GUAIFENESIN-CODEINE 100-10 MG/5ML PO SOLN
5.0000 mL | ORAL | Status: DC | PRN
  Administered 2016-12-14 – 2016-12-18 (×8): 5 mL via ORAL
  Filled 2016-12-14 (×11): qty 5

## 2016-12-14 MED ORDER — DM-GUAIFENESIN ER 30-600 MG PO TB12
1.0000 | ORAL_TABLET | Freq: Two times a day (BID) | ORAL | Status: DC
  Administered 2016-12-14 – 2016-12-24 (×18): 1 via ORAL
  Filled 2016-12-14 (×21): qty 1

## 2016-12-14 MED ORDER — ONDANSETRON HCL 4 MG PO TABS: 4.0000 mg | ORAL_TABLET | Freq: Four times a day (QID) | ORAL | Status: DC | PRN

## 2016-12-14 NOTE — Progress Notes (Signed)
Text paged [facility] admissions to notify MD of patient's arrival in 6N07.

## 2016-12-14 NOTE — Progress Notes (Signed)
PROGRESS NOTE    Mallory Morales  [id number] DOB: 1956-06-26 DOA: 12/13/2016 PCP: Mallory Huh, MD   Chief Complaint  Patient presents with  . Generalized Body Aches     Brief Narrative:  HPI on 12/14/2016 by Dr. Fuller Plan Mallory Morales is a 60 y.o. female with medical history significant of HTN, HLD, T3N0 colon cancer s/p removal, DVT, and PE; who presents with complaints of bilateral lower extremity crampy pain that started yesterday morning. Patient notes that she was unable to get out of bed due to the symptoms. Any movement seemed to exacerbate the symptoms. Associated symptoms include a soreness of the upper extremities, metallic taste in her mouth, and dizziness. Mallory Morales makes known that there is been a six-month history of back and kidney pains for which she was evaluated with a CT scan of the abdomen and pelvis in 08/2016; which showed no acute abnormality or cause for her symptoms. Patient notes that she just recently gotten over a "cold", but still notes a lingering persistent cough. On average the patient notes that she drinks about a gallon of water per day. She denies any recent medication changes noting that she has been on statin and hydrochlorothiazide for 8-10 years. Denies participating in any strenuous activity, trauma/falls, shortness of breath, chest pain, dysuria, change in vision, or focal weakness. Assessment & Plan   Muscle aches secondary to Acute Rhabdomyolysis -CPK upon admission >50000 -Possibly secondary to medications (patient has been on a statin for years, however states that over past month, may have doubled her dose as she has forgotten whether or not she took her medications) -Creatinine normal -Continue IVF and pain control as needed -Monitor CPK  Hypokalemia -Potassium was 3.1 on admission.  -Potassium could be low secondary to patient's use of HCTZ. -replace and continue to monitor BMP -Obtain Magnesium level  Essential  hypertension -Hold HCTZ   Hyperlipidemia -Hold simvastatin  Elevated LFTS -likely secondary to the above -Continue IVF -Monitor CMP  History of colon cancer -Review of records shows that her CEA levels have been on upward trend with the last CEA level noted as 5 (was 8 prior to that).  -CT abd/pelvis 09/13/2016 showed no recurrence   -Patient is followed by Dr. Burney Gauze. Last flexible sigmoidoscopy was in 2015 and she is due for a repeat in 2018.  History of DVT/PE -Thought to be related to patient's colon cancer, but is no longer on anticoagulation.   Constipation -Will order miralax  DVT Prophylaxis  heparin  Code Status: Full  Family Communication: None at bedside  Disposition Plan: Admitted. Home when stable  Consultants None  Procedures  None  Antibiotics   Anti-infectives    None      Subjective:   Mallory Morales seen and examined today.  Continues to have muscle pains in arm and legs, which has been ongoing for several months. Denies chest pain, shortness of breath, abdominal pain, N/V/D.  Does complain of constipation.   Objective:   Vitals:   12/14/16 0000 12/14/16 0056 12/14/16 0134 12/14/16 0538  BP: 149/82 (!) 171/103 (!) 139/95 (!) 145/80  Pulse: 90 95 68 79  Resp: 18 18 18 18   Temp:   98.7 F (37.1 C) 98.4 F (36.9 C)  TempSrc:   Oral Oral  SpO2: 100% 100% 98% 100%  Weight:    85.5 kg (188 lb 7.9 oz)  Height:   5\' 5"  (1.651 m)     Intake/Output Summary (Last 24 hours) at  12/14/16 1240 Last data filed at 12/14/16 1038  Gross per 24 hour  Intake          3169.17 ml  Output             2750 ml  Net           419.17 ml   Filed Weights   12/13/16 1724 12/14/16 0538  Weight: 80.7 kg (178 lb) 85.5 kg (188 lb 7.9 oz)    Exam  General: Well developed, well nourished, NAD, appears stated age  HEENT: NCAT, mucous membranes moist.   Cardiovascular: S1 S2 auscultated, no rubs, murmurs or gallops. Regular rate and  rhythm.  Respiratory: Clear to auscultation bilaterally with equal chest rise  Abdomen: Soft, nontender, nondistended, + bowel sounds  Extremities: warm dry without cyanosis clubbing or edema  Neuro: AAOx3, nonfocal  Psych: Normal affect and demeanor with intact judgement and insight   Data Reviewed: I have personally reviewed following labs and imaging studies  CBC:  Recent Labs Lab 12/13/16 1930 12/14/16 0524  WBC 4.1 2.8*  NEUTROABS 2.7 1.2*  HGB 15.0 13.1  HCT 41.4 36.9  MCV 87.0 85.8  PLT 128* [id number]*   Basic Metabolic Panel:  Recent Labs Lab 12/13/16 1930 12/14/16 0524  NA 138 141  K 3.1* 3.1*  CL 100* 101  CO2 27 30  GLUCOSE 154* 126*  BUN 12 6  CREATININE 0.91 0.70  CALCIUM 8.9 8.5*   GFR: Estimated Creatinine Clearance: 82.8 mL/min (by C-G formula based on SCr of 0.7 mg/dL). Liver Function Tests:  Recent Labs Lab 12/14/16 0524  AST 222*  ALT 80*  ALKPHOS 48  BILITOT 0.8  PROT 5.6*  ALBUMIN 3.2*   No results for input(s): LIPASE, AMYLASE in the last 168 hours. No results for input(s): AMMONIA in the last 168 hours. Coagulation Profile: No results for input(s): INR, PROTIME in the last 168 hours. Cardiac Enzymes:  Recent Labs Lab 12/13/16 1930 12/14/16 0524  CKTOTAL >50,000* >50,000*   BNP (last 3 results) No results for input(s): PROBNP in the last 8760 hours. HbA1C: No results for input(s): HGBA1C in the last 72 hours. CBG: No results for input(s): GLUCAP in the last 168 hours. Lipid Profile:  Recent Labs  12/14/16 0531  CHOL 160  HDL 49  LDLCALC 93  TRIG 92  CHOLHDL 3.3   Thyroid Function Tests: No results for input(s): TSH, T4TOTAL, FREET4, T3FREE, THYROIDAB in the last 72 hours. Anemia Panel: No results for input(s): VITAMINB12, FOLATE, FERRITIN, TIBC, IRON, RETICCTPCT in the last 72 hours. Urine analysis:    Component Value Date/Time   COLORURINE YELLOW 12/13/2016 2233   APPEARANCEUR CLEAR 12/13/2016 2233    LABSPEC 1.004 (L) 12/13/2016 2233   PHURINE 7.0 12/13/2016 2233   GLUCOSEU NEGATIVE 12/13/2016 2233   HGBUR LARGE (A) 12/13/2016 2233   BILIRUBINUR NEGATIVE 12/13/2016 2233   KETONESUR NEGATIVE 12/13/2016 2233   PROTEINUR NEGATIVE 12/13/2016 2233   UROBILINOGEN 1.0 01/30/2012 1450   NITRITE NEGATIVE 12/13/2016 2233   LEUKOCYTESUR TRACE (A) 12/13/2016 2233   Sepsis Labs: @LABRCNTIP (procalcitonin:4,lacticidven:4)  )No results found for this or any previous visit (from the past 240 hour(s)).    Radiology Studies: Dg Chest Port 1 View  Result Date: 12/14/2016 CLINICAL DATA:  Dry cough for 5 days, history hypertension, former smoker, colon cancer, pulmonary embolism EXAM: PORTABLE CHEST 1 VIEW COMPARISON:  Portable exam 0854 hours compared to 01/30/2012 and correlated with interval CT chest of 09/07/2014 FINDINGS: Normal heart size, mediastinal  contours, and pulmonary vascularity. Lungs clear. No pleural effusion or pneumothorax. Bones unremarkable. IMPRESSION: No acute abnormalities. Electronically Signed   By: Lavonia Dana M.D.   On: 12/14/2016 10:06     Scheduled Meds: . heparin  5,000 Units Subcutaneous Q8H  . polyethylene glycol  17 g Oral Daily   Continuous Infusions: .  sodium bicarbonate  infusion 1000 mL 125 mL/hr at 12/14/16 0610     LOS: 1 day   Time Spent in minutes   30 minutes  Shonna Deiter D.O. on 12/14/2016 at 12:40 PM  Between 7am to 7pm - Pager - [phone]  After 7pm go to [email or web address] - password [email or web address]  And look for the night coverage person covering for me after hours  [facility] Office  [phone]

## 2016-12-14 NOTE — H&P (Addendum)
History and Physical    Mallory Morales [id number] DOB: [birth year] DOA: 12/13/2016  Referring MD/NP/PA: Dr. Myrene Buddy PCP: Simona Huh, MD  Patient coming from: [facility]  Chief Complaint: Muscle cramps  HPI: Mallory Morales is a 60 y.o. female with medical history significant of HTN, HLD, T3N0 colon cancer s/p removal, DVT, and PE; who presents with complaints of bilateral lower extremity crampy pain that started yesterday morning. Patient notes that she was unable to get out of bed due to the symptoms. Any movement seemed to exacerbate the symptoms. Associated symptoms include a soreness of the upper extremities, metallic taste in her mouth, and dizziness. Mallory Morales makes known that there is been a six-month history of back and kidney pains for which she was evaluated with a CT scan of the abdomen and pelvis in 08/2016; which showed no acute abnormality or cause for her symptoms. Patient notes that she just recently gotten over a "cold", but still notes a lingering persistent cough. On average the patient notes that she drinks about a gallon of water per day. She denies any recent medication changes noting that she has been on statin and hydrochlorothiazide for 8-10 years. Denies participating in any strenuous activity, trauma/falls, shortness of breath, chest pain, dysuria, change in vision, or focal weakness.  ED Course: Initial CPK was > 50,000. Patient was given 2 L of IV fluids and then started on a sodium bicarbonate drip at a rate of 125 mL per hour prior to being transported to a MedSurg bed.  Review of Systems: As per HPI otherwise 10 point review of systems negative.   Past Medical History:  Diagnosis Date  . Anemia, iron deficiency 01/27/2012  . Clotting disorder ([place])   . Colon cancer ([place])    T3N0M0, emergency surgery/with colostomy done-no further tx.  . Complication of anesthesia    "hard time getting breath" post op  . DVT (deep venous thrombosis) ([place])   .  Hyperlipemia   . Hypertension   . Pulmonary embolism ([facility])    taking injections -last done 1'14([facility])    Past Surgical History:  Procedure Laterality Date  . ABDOMINAL HYSTERECTOMY    . COLON RESECTION  01/14/2012   Procedure: COLON RESECTION;  Surgeon: Odis Hollingshead, MD;  Location: [place];  Service: General;  Laterality: N/A;  left colectomy, mobilization of splenic flexure, ostomy.  . COLOSTOMY  01/14/2012  . COLOSTOMY TAKEDOWN N/A 02/24/2013   Procedure: Laparoscopic Assisted Colostomy Closure;  Surgeon: Odis Hollingshead, MD;  Location: [facility];  Service: General;  Laterality: N/A;  Laparoscopic Assisted Colostomy Closure  . FLEXIBLE SIGMOIDOSCOPY N/A 03/17/2014   Procedure: FLEXIBLE SIGMOIDOSCOPY;  Surgeon: Beryle Beams, MD;  Location: [facility];  Service: Endoscopy;  Laterality: N/A;  . FLEXIBLE SIGMOIDOSCOPY N/A 04/07/2014   Procedure: FLEXIBLE SIGMOIDOSCOPY;  Surgeon: Beryle Beams, MD;  Location: [facility];  Service: Endoscopy;  Laterality: N/A;  no sedation  . HERNIA REPAIR    . HOT HEMOSTASIS N/A 03/17/2014   Procedure: HOT HEMOSTASIS (ARGON PLASMA COAGULATION/BICAP);  Surgeon: Beryle Beams, MD;  Location: [facility];  Service: Endoscopy;  Laterality: N/A;  . HOT HEMOSTASIS N/A 04/07/2014   Procedure: HOT HEMOSTASIS (ARGON PLASMA COAGULATION/BICAP);  Surgeon: Beryle Beams, MD;  Location: [facility];  Service: Endoscopy;  Laterality: N/A;     reports that she quit smoking about 31 years ago. Her smoking use included Cigarettes. She started smoking about 42 years ago. She has a 5.50 pack-year smoking history.  She has never used smokeless tobacco. She reports that she drinks about 0.6 oz of alcohol per week . She reports that she does not use drugs.  Allergies  Allergen Reactions  . Strawberry Extract Swelling    Face only. Breathing not affected.    Family History  Problem Relation Age of Onset  . Heart attack Father   . Hypertension Father   . Heart  disease Father   . Stomach cancer Sister   . Pancreatic cancer Brother   . Cancer Brother     pancreatic    Prior to Admission medications   Medication Sig Start Date End Date Taking? Authorizing Provider  hydrochlorothiazide (HYDRODIURIL) 25 MG tablet Take 25 mg by mouth every morning. 01/27/12 03/01/15  Saverio Danker, PA-C  hydrochlorothiazide (HYDRODIURIL) 25 MG tablet  02/04/16   Historical Provider, MD  simvastatin (ZOCOR) 40 MG tablet Take 40 mg by mouth every morning. 01/27/12   Saverio Danker, PA-C    Physical Exam:    Constitutional: Well-developed female in mild distress when trying to change physicians in the bed. Vitals:   12/13/16 2359 12/14/16 0000 12/14/16 0056 12/14/16 0134  BP: 149/82 149/82 (!) 171/103 (!) 139/95  Pulse: 91 90 95 68  Resp: 18 18 18 18   Temp:    98.7 F (37.1 C)  TempSrc:    Oral  SpO2: 100% 100% 100% 98%  Weight:      Height:    5\' 5"  (1.651 m)   Eyes: PERRL, lids and conjunctivae normal ENMT: Mucous membranes are moist. Posterior pharynx clear of any exudate or lesions. Normal dentition.  Neck: normal, supple, no masses, no thyromegaly Respiratory: clear to auscultation bilaterally, no wheezing, no crackles. Normal respiratory effort. No accessory muscle use.  Cardiovascular: Regular rate and rhythm, no murmurs / rubs / gallops. No extremity edema. 2+ pedal pulses. No carotid bruits.  Abdomen: no tenderness, no masses palpated. No hepatosplenomegaly. Bowel sounds positive.  Musculoskeletal: no clubbing / cyanosis. No joint deformity upper and lower extremities. Tenderness to palpation of the upper and lower extremities. Skin: no rashes, lesions, ulcers. No induration Neurologic: CN 2-12 grossly intact. Sensation intact, DTR normal. Strength 5/5 in all 4.  Psychiatric: Normal judgment and insight. Alert and oriented x 3. Normal mood.     Labs on Admission: I have personally reviewed following labs and imaging studies  CBC:  Recent Labs Lab  12/13/16 1930  WBC 4.1  NEUTROABS 2.7  HGB 15.0  HCT 41.4  MCV 87.0  PLT 0000000*   Basic Metabolic Panel:  Recent Labs Lab 12/13/16 1930  NA 138  K 3.1*  CL 100*  CO2 27  GLUCOSE 154*  BUN 12  CREATININE 0.91  CALCIUM 8.9   GFR: Estimated Creatinine Clearance: 70.7 mL/min (by C-G formula based on SCr of 0.91 mg/dL). Liver Function Tests: No results for input(s): AST, ALT, ALKPHOS, BILITOT, PROT, ALBUMIN in the last 168 hours. No results for input(s): LIPASE, AMYLASE in the last 168 hours. No results for input(s): AMMONIA in the last 168 hours. Coagulation Profile: No results for input(s): INR, PROTIME in the last 168 hours. Cardiac Enzymes:  Recent Labs Lab 12/13/16 1930  CKTOTAL >50,000*   BNP (last 3 results) No results for input(s): PROBNP in the last 8760 hours. HbA1C: No results for input(s): HGBA1C in the last 72 hours. CBG: No results for input(s): GLUCAP in the last 168 hours. Lipid Profile: No results for input(s): CHOL, HDL, LDLCALC, TRIG, CHOLHDL, LDLDIRECT in the last  72 hours. Thyroid Function Tests: No results for input(s): TSH, T4TOTAL, FREET4, T3FREE, THYROIDAB in the last 72 hours. Anemia Panel: No results for input(s): VITAMINB12, FOLATE, FERRITIN, TIBC, IRON, RETICCTPCT in the last 72 hours. Urine analysis:    Component Value Date/Time   COLORURINE YELLOW 12/13/2016 2233   APPEARANCEUR CLEAR 12/13/2016 2233   LABSPEC 1.004 (L) 12/13/2016 2233   PHURINE 7.0 12/13/2016 2233   GLUCOSEU NEGATIVE 12/13/2016 2233   HGBUR LARGE (A) 12/13/2016 2233   BILIRUBINUR NEGATIVE 12/13/2016 2233   KETONESUR NEGATIVE 12/13/2016 2233   PROTEINUR NEGATIVE 12/13/2016 2233   UROBILINOGEN 1.0 01/30/2012 1450   NITRITE NEGATIVE 12/13/2016 2233   LEUKOCYTESUR TRACE (A) 12/13/2016 2233   Sepsis Labs: No results found for this or any previous visit (from the past 240 hour(s)).   Radiological Exams on Admission: No results  found.    Assessment/Plan Muscle aches secondary to Rhabdomyolysis: Acute. CPK greater than 50,000. Question possibility of medication versus blood disorder versus underlying metabolic deficiency versus  - Admit to a MedSurg bed - Up with assistance - Incentive spirometry - Continue sodium bicarbonate drip for now - Check a CMP, peripheral smear, and CPK in a.m. - Hydrocodone prn pain  Hypokalemia: Patient's initial potassium was 3.1 on admission. Potassium could be low secondary to patient's use of HCTZ. - Follow-up repeat potassium a.m. as patients usually have a risk of hyperkalemia from rhabdomyolysis  Essential hypertension - Hold HCTZ   Hyperlipidemia - Hold simvastatin  History of colon cancer: Review of records shows that her CEA levels have been on upward trend with the last CEA level noted as 5. Patient is followed by Dr. Burney Gauze. Last flexible sigmoidoscopy was in 2015 and she is due for a repeat in 2018.  History of DVT/PE: Thought to be related to patient's colon cancer, but is no longer on anticoagulation.   DVT prophylaxis:  heparin Code Status: Full  Family Communication:  No family present at bedside Disposition Plan: likely discharge home once medically stable. Consults called: None Admission status:inpatient   Norval Morton MD [facility] Pager [phone]  If 7PM-7AM, please contact night-coverage [email or web address] Password [email or web address]  12/14/2016, 3:05 AM

## 2016-12-15 DIAGNOSIS — D696 Thrombocytopenia, unspecified: Secondary | ICD-10-CM

## 2016-12-15 LAB — COMPREHENSIVE METABOLIC PANEL
ALBUMIN: 3.1 g/dL — AB (ref 3.5–5.0)
ALT: 110 U/L — ABNORMAL HIGH (ref 14–54)
AST: 308 U/L — AB (ref 15–41)
Alkaline Phosphatase: 41 U/L (ref 38–126)
Anion gap: 6 (ref 5–15)
BUN: 7 mg/dL (ref 6–20)
CHLORIDE: 97 mmol/L — AB (ref 101–111)
CO2: 40 mmol/L — ABNORMAL HIGH (ref 22–32)
Calcium: 8.4 mg/dL — ABNORMAL LOW (ref 8.9–10.3)
Creatinine, Ser: 0.82 mg/dL (ref 0.44–1.00)
GFR calc Af Amer: >60 mL/min (ref >60)
Glucose, Bld: 137 mg/dL — ABNORMAL HIGH (ref 65–99)
POTASSIUM: 2.9 mmol/L — AB (ref 3.5–5.1)
SODIUM: 143 mmol/L (ref 135–145)
Total Bilirubin: 0.7 mg/dL (ref 0.3–1.2)
Total Protein: 5.4 g/dL — ABNORMAL LOW (ref 6.5–8.1)

## 2016-12-15 LAB — CBC
HCT: 35.5 % — ABNORMAL LOW (ref 36.0–46.0)
Hemoglobin: 12.2 g/dL (ref 12.0–15.0)
MCH: 30.8 pg (ref 26.0–34.0)
MCHC: 34.4 g/dL (ref 30.0–36.0)
MCV: 89.6 fL (ref 78.0–100.0)
PLATELETS: 95 10*3/uL — AB (ref 150–400)
RBC: 3.96 MIL/uL (ref 3.87–5.11)
RDW: 12.5 % (ref 11.5–15.5)
WBC: 3.1 10*3/uL — AB (ref 4.0–10.5)

## 2016-12-15 LAB — CK

## 2016-12-15 LAB — MAGNESIUM: MAGNESIUM: 2 mg/dL (ref 1.7–2.4)

## 2016-12-15 MED ORDER — POTASSIUM CHLORIDE CRYS ER 20 MEQ PO TBCR
40.0000 meq | EXTENDED_RELEASE_TABLET | Freq: Two times a day (BID) | ORAL | Status: DC
  Administered 2016-12-15 – 2016-12-24 (×19): 40 meq via ORAL
  Filled 2016-12-15 (×19): qty 2

## 2016-12-15 MED ORDER — SODIUM CHLORIDE 0.9 % IV SOLN
INTRAVENOUS | Status: DC
  Administered 2016-12-15 – 2016-12-17 (×6): via INTRAVENOUS

## 2016-12-15 NOTE — Progress Notes (Signed)
PROGRESS NOTE    Mallory Morales  [id number] DOB: [birth year] DOA: 12/13/2016 PCP: Simona Huh, MD   Chief Complaint  Patient presents with  . Generalized Body Aches     Brief Narrative:  HPI on 12/14/2016 by Dr. Fuller Plan LAFAWN AGUILLAR is a 60 y.o. female with medical history significant of HTN, HLD, T3N0 colon cancer s/p removal, DVT, and PE; who presents with complaints of bilateral lower extremity crampy pain that started yesterday morning. Patient notes that she was unable to get out of bed due to the symptoms. Any movement seemed to exacerbate the symptoms. Associated symptoms include a soreness of the upper extremities, metallic taste in her mouth, and dizziness. Ms. Fitts makes known that there is been a six-month history of back and kidney pains for which she was evaluated with a CT scan of the abdomen and pelvis in 08/2016; which showed no acute abnormality or cause for her symptoms. Patient notes that she just recently gotten over a "cold", but still notes a lingering persistent cough. On average the patient notes that she drinks about a gallon of water per day. She denies any recent medication changes noting that she has been on statin and hydrochlorothiazide for 8-10 years. Denies participating in any strenuous activity, trauma/falls, shortness of breath, chest pain, dysuria, change in vision, or focal weakness. Assessment & Plan   Muscle aches secondary to Acute Rhabdomyolysis -CPK upon admission >50000 -Possibly secondary to medications (patient has been on a statin for years, however states that over past month, may have doubled her dose as she has forgotten whether or not she took her medications) -Creatinine normal -Continue IVF and pain control as needed- changed IV to NS -Monitor CPK (still >50K)  Hypokalemia -Potassium was 3.1 on admission. Currently 2.9 -Potassium could be low secondary to patient's use of HCTZ. -replace and continue to monitor  BMP -Magnesium level 2  Essential hypertension -Hold HCTZ   Hyperlipidemia -Hold simvastatin  Elevated LFTS -likely secondary to the above -Continue IVF -Obtain US -Monitor CMP  History of colon cancer -Review of records shows that her CEA levels have been on upward trend with the last CEA level noted as 5 (was 8 prior to that).  -CT abd/pelvis 09/13/2016 showed no recurrence   -Patient is followed by Dr. Burney Gauze. Last flexible sigmoidoscopy was in 2015 and she is due for a repeat in 2018.  History of DVT/PE -Thought to be related to patient's colon cancer, but is no longer on anticoagulation.   Constipation -Continue miralax  Thromobocytopenia -Possibly dilutional, platelets 95 -discontinued heparin -Continue to monitor CBC  DVT Prophylaxis  SCDs  Code Status: Full  Family Communication: None at bedside  Disposition Plan: Admitted. Home when stable  Consultants None  Procedures  None  Antibiotics   Anti-infectives    None      Subjective:   Mallory Morales seen and examined today.  Continues to have muscle pains in arm and legs, and feels swelling in her thighs. Complains of some cough. Denies chest pain, shortness of breath, abdominal pain, N/V/D.  Does complain of constipation.   Objective:   Vitals:   12/14/16 0538 12/14/16 1428 12/14/16 2107 12/15/16 0512  BP: (!) 145/80 131/79 136/70 120/70  Pulse: 79 91 88 72  Resp: 18 18 18 18   Temp: 98.4 F (36.9 C) 98.6 F (37 C) 99.1 F (37.3 C) 98.2 F (36.8 C)  TempSrc: Oral Oral Oral Oral  SpO2: 100% 100% 94% 96%  Weight:  85.5 kg (188 lb 7.9 oz)     Height:        Intake/Output Summary (Last 24 hours) at 12/15/16 1009 Last data filed at 12/15/16 0955  Gross per 24 hour  Intake             3657 ml  Output             5100 ml  Net            -1443 ml   Filed Weights   12/13/16 1724 12/14/16 0538  Weight: 80.7 kg (178 lb) 85.5 kg (188 lb 7.9 oz)    Exam  General: Well developed,  well nourished, NAD, appears stated age  40: NCAT, mucous membranes moist.   Cardiovascular: S1 S2 auscultated, RRR, no murmurs  Respiratory: Clear to auscultation bilaterally with equal chest rise  Abdomen: Soft, nontender, nondistended, + bowel sounds  Extremities: warm dry without cyanosis clubbing or edema  Neuro: AAOx3, nonfocal  Psych: Normal affect and demeanor with intact judgement and insight, pleasant  Data Reviewed: I have personally reviewed following labs and imaging studies  CBC:  Recent Labs Lab 12/13/16 1930 12/14/16 0524 12/15/16 0407  WBC 4.1 2.8* 3.1*  NEUTROABS 2.7 1.2*  --   HGB 15.0 13.1 12.2  HCT 41.4 36.9 35.5*  MCV 87.0 85.8 89.6  PLT 128* 114* 95*   Basic Metabolic Panel:  Recent Labs Lab 12/13/16 1930 12/14/16 0524 12/15/16 0407  NA 138 141 143  K 3.1* 3.1* 2.9*  CL 100* 101 97*  CO2 27 30 40*  GLUCOSE 154* 126* 137*  BUN 12 6 7   CREATININE 0.91 0.70 0.82  CALCIUM 8.9 8.5* 8.4*  MG  --   --  2.0   GFR: Estimated Creatinine Clearance: 80.8 mL/min (by C-G formula based on SCr of 0.82 mg/dL). Liver Function Tests:  Recent Labs Lab 12/14/16 0524 12/15/16 0407  AST 222* 308*  ALT 80* 110*  ALKPHOS 48 41  BILITOT 0.8 0.7  PROT 5.6* 5.4*  ALBUMIN 3.2* 3.1*   No results for input(s): LIPASE, AMYLASE in the last 168 hours. No results for input(s): AMMONIA in the last 168 hours. Coagulation Profile: No results for input(s): INR, PROTIME in the last 168 hours. Cardiac Enzymes:  Recent Labs Lab 12/13/16 1930 12/14/16 0524 12/15/16 0407  CKTOTAL >50,000* >50,000* >50,000*   BNP (last 3 results) No results for input(s): PROBNP in the last 8760 hours. HbA1C: No results for input(s): HGBA1C in the last 72 hours. CBG: No results for input(s): GLUCAP in the last 168 hours. Lipid Profile:  Recent Labs  12/14/16 0531  CHOL 160  HDL 49  LDLCALC 93  TRIG 92  CHOLHDL 3.3   Thyroid Function Tests: No results for  input(s): TSH, T4TOTAL, FREET4, T3FREE, THYROIDAB in the last 72 hours. Anemia Panel: No results for input(s): VITAMINB12, FOLATE, FERRITIN, TIBC, IRON, RETICCTPCT in the last 72 hours. Urine analysis:    Component Value Date/Time   COLORURINE YELLOW 12/13/2016 2233   APPEARANCEUR CLEAR 12/13/2016 2233   LABSPEC 1.004 (L) 12/13/2016 2233   PHURINE 7.0 12/13/2016 2233   GLUCOSEU NEGATIVE 12/13/2016 2233   HGBUR LARGE (A) 12/13/2016 2233   BILIRUBINUR NEGATIVE 12/13/2016 2233   KETONESUR NEGATIVE 12/13/2016 2233   PROTEINUR NEGATIVE 12/13/2016 2233   UROBILINOGEN 1.0 01/30/2012 1450   NITRITE NEGATIVE 12/13/2016 2233   LEUKOCYTESUR TRACE (A) 12/13/2016 2233   Sepsis Labs: @LABRCNTIP (procalcitonin:4,lacticidven:4)  )No results found for this or any previous visit (  from the past 240 hour(s)).    Radiology Studies: Dg Chest Port 1 View  Result Date: 12/14/2016 CLINICAL DATA:  Dry cough for 5 days, history hypertension, former smoker, colon cancer, pulmonary embolism EXAM: PORTABLE CHEST 1 VIEW COMPARISON:  Portable exam 0854 hours compared to 01/30/2012 and correlated with interval CT chest of 09/07/2014 FINDINGS: Normal heart size, mediastinal contours, and pulmonary vascularity. Lungs clear. No pleural effusion or pneumothorax. Bones unremarkable. IMPRESSION: No acute abnormalities. Electronically Signed   By: Lavonia Dana M.D.   On: 12/14/2016 10:06     Scheduled Meds: . dextromethorphan-guaiFENesin  1 tablet Oral BID  . polyethylene glycol  17 g Oral Daily  . potassium chloride  40 mEq Oral BID   Continuous Infusions: . sodium chloride       LOS: 2 days   Time Spent in minutes   30 minutes  Shailen Thielen D.O. on 12/15/2016 at 10:09 AM  Between 7am to 7pm - Pager - [phone]  After 7pm go to [email or web address] - password [email or web address]  And look for the night coverage person covering for me after hours  [facility] Office  [phone]

## 2016-12-16 ENCOUNTER — Inpatient Hospital Stay (HOSPITAL_COMMUNITY): Payer: BLUE CROSS/BLUE SHIELD

## 2016-12-16 DIAGNOSIS — M79609 Pain in unspecified limb: Secondary | ICD-10-CM

## 2016-12-16 DIAGNOSIS — Z86718 Personal history of other venous thrombosis and embolism: Secondary | ICD-10-CM

## 2016-12-16 LAB — CBC
HCT: 34.7 % — ABNORMAL LOW (ref 36.0–46.0)
Hemoglobin: 11.9 g/dL — ABNORMAL LOW (ref 12.0–15.0)
MCH: 30.4 pg (ref 26.0–34.0)
MCHC: 34.3 g/dL (ref 30.0–36.0)
MCV: 88.5 fL (ref 78.0–100.0)
PLATELETS: 108 10*3/uL — AB (ref 150–400)
RBC: 3.92 MIL/uL (ref 3.87–5.11)
RDW: 12.4 % (ref 11.5–15.5)
WBC: 3.7 10*3/uL — AB (ref 4.0–10.5)

## 2016-12-16 LAB — BASIC METABOLIC PANEL
ANION GAP: 10 (ref 5–15)
BUN: 6 mg/dL (ref 6–20)
CALCIUM: 8.6 mg/dL — AB (ref 8.9–10.3)
CO2: 24 mmol/L (ref 22–32)
Chloride: 108 mmol/L (ref 101–111)
Creatinine, Ser: 0.63 mg/dL (ref 0.44–1.00)
GFR calc Af Amer: >60 mL/min (ref >60)
Glucose, Bld: 123 mg/dL — ABNORMAL HIGH (ref 65–99)
Potassium: 4.3 mmol/L (ref 3.5–5.1)
SODIUM: 142 mmol/L (ref 135–145)

## 2016-12-16 LAB — HEPATIC FUNCTION PANEL
ALT: 139 U/L — ABNORMAL HIGH (ref 14–54)
AST: 382 U/L — AB (ref 15–41)
Albumin: 3.1 g/dL — ABNORMAL LOW (ref 3.5–5.0)
Alkaline Phosphatase: 40 U/L (ref 38–126)
BILIRUBIN DIRECT: 0.1 mg/dL (ref 0.1–0.5)
BILIRUBIN INDIRECT: 0.5 mg/dL (ref 0.3–0.9)
Total Bilirubin: 0.6 mg/dL (ref 0.3–1.2)
Total Protein: 5.3 g/dL — ABNORMAL LOW (ref 6.5–8.1)

## 2016-12-16 LAB — CK

## 2016-12-16 NOTE — Progress Notes (Signed)
*  PRELIMINARY RESULTS* Vascular Ultrasound Bilateral lower extremity venous duplex has been completed.  Preliminary findings: No evidence of acute deep vein thrombosis in the visualized veins of the lower extremities. Negative for baker's cysts bilaterally.   Mallory Morales 12/16/2016, 2:49 PM

## 2016-12-16 NOTE — Consult Note (Signed)
Reason for Consult: Abnormal LFT's. Referring Physician: THP  SACHEEN Morales is an 60 y.o. female.  HPI: Mallory Morales is a 60 year old black female, with multiple medical problems listed below. admitted to [facility] on 12/13/2016 with acute bilateral lower extremity pain and thigh tenderness. She claims she was in her usual state of health till 12/12/2016 when she developed acute lower extremity discomfort preventing her from ambulating properly. She claims she had similar symptoms several years ago when she went to see primary care provider and was treated with nonsteroidals apparently her symptoms resolved with use of nonsteroidals and increase fluid intake over couple of days. However this time her symptoms seem worse prompting her to come to the emergency room. On evaluation in the ER her CPKs were found to be over 50,002 at which point she was admitted for IV hydration and further follow-up. She denies any recent falls or strenuous activity. She denies any shortness of breath or chest pain.  She's been on HCT3N0and Simvastatin for several years and believes she may have taken double doses of these medications in the recent past without realizing she was doing so.. She denies a history of hepatitis or needle stick injuries. She's never had any liver disease in the past she denies a family history of liver disease. She's had a colostomy with a takedown done for Stage III colon cancer T3N0M0 in 2013. Her medical history is also complicated by the D a DVT and PE as mentioned in her past medical history below. Of note, a recent CT scan done in September of this year for back pain abdominal pain was essentially unrevealing. Her brother died from pancreatic cancer at the age of 20 and her sister died of stomach cancer at the age of 107. They were both smokers. She denies any OTC drug use or weight loss supplements. She has taken some Tylenol cold and sinus and Theraflu last week for a "cold'. She  denies any other risk factors for rhabdomyolysis.    Past Medical History:  Diagnosis Date  . Anemia, iron deficiency 01/27/2012  . Colon cancer ([place])    T3N0M0, emergency surgery/with colostomy done-no further tx.  . Complication of anesthesia    "hard time getting breath" post op  . DVT (deep venous thrombosis) ([place])   . Hyperlipemia   . Hypertension   . Pulmonary embolism ([facility])    taking injections -last done 1'14(Arixtra)   Past Surgical History:  Procedure Laterality Date  . ABDOMINAL HYSTERECTOMY    . COLON RESECTION  01/14/2012   Procedure: COLON RESECTION;  Surgeon: Odis Hollingshead, MD;  Location: [place];  Service: General;  Laterality: N/A;  left colectomy, mobilization of splenic flexure, ostomy.  . COLOSTOMY  01/14/2012  . COLOSTOMY TAKEDOWN N/A 02/24/2013   Procedure: Laparoscopic Assisted Colostomy Closure;  Surgeon: Odis Hollingshead, MD;  Location: [facility];  Service: General;  Laterality: N/A;  Laparoscopic Assisted Colostomy Closure  . FLEXIBLE SIGMOIDOSCOPY N/A 03/17/2014   Procedure: FLEXIBLE SIGMOIDOSCOPY;  Surgeon: Beryle Beams, MD;  Location: [facility];  Service: Endoscopy;  Laterality: N/A;  . FLEXIBLE SIGMOIDOSCOPY N/A 04/07/2014   Procedure: FLEXIBLE SIGMOIDOSCOPY;  Surgeon: Beryle Beams, MD;  Location: [facility];  Service: Endoscopy;  Laterality: N/A;  no sedation  . HERNIA REPAIR    . HOT HEMOSTASIS N/A 03/17/2014   Procedure: HOT HEMOSTASIS (ARGON PLASMA COAGULATION/BICAP);  Surgeon: Beryle Beams, MD;  Location: [facility];  Service: Endoscopy;  Laterality: N/A;  .  HOT HEMOSTASIS N/A 04/07/2014   Procedure: HOT HEMOSTASIS (ARGON PLASMA COAGULATION/BICAP);  Surgeon: Beryle Beams, MD;  Location: [facility];  Service: Endoscopy;  Laterality: N/A;   Family History  Problem Relation Age of Onset  . Heart attack Father   . Hypertension Father   . Heart disease Father   . Stomach cancer Sister   . Pancreatic cancer Brother   . Cancer Brother      pancreatic   Social History:  reports that she quit smoking about 31 years ago. Her smoking use included Cigarettes. She started smoking about 42 years ago. She has never used smokeless tobacco. She reports that she drinks about 0.6 oz of alcohol per week . She reports that she does not use drugs.  Allergies:  Allergies  Allergen Reactions  . Strawberry Extract Swelling    Face only. Breathing not affected.   Medications: I have reviewed the patient's current medications.  Results for orders placed or performed during the hospital encounter of 12/13/16 (from the past 48 hour(s))  CK     Status: Abnormal   Collection Time: 12/15/16  4:07 AM  Result Value Ref Range   Total CK >50,000 (H) 38 - 234 U/L    Comment: RESULTS CONFIRMED BY MANUAL DILUTION  CBC     Status: Abnormal   Collection Time: 12/15/16  4:07 AM  Result Value Ref Range   WBC 3.1 (L) 4.0 - 10.5 K/uL   RBC 3.96 3.87 - 5.11 MIL/uL   Hemoglobin 12.2 12.0 - 15.0 g/dL   HCT 35.5 (L) 36.0 - 46.0 %   MCV 89.6 78.0 - 100.0 fL   MCH 30.8 26.0 - 34.0 pg   MCHC 34.4 30.0 - 36.0 g/dL   RDW 12.5 11.5 - 15.5 %   Platelets 95 (L) 150 - 400 K/uL    Comment: CONSISTENT WITH PREVIOUS RESULT  Comprehensive metabolic panel     Status: Abnormal   Collection Time: 12/15/16  4:07 AM  Result Value Ref Range   Sodium 143 135 - 145 mmol/L   Potassium 2.9 (L) 3.5 - 5.1 mmol/L   Chloride 97 (L) 101 - 111 mmol/L   CO2 40 (H) 22 - 32 mmol/L   Glucose, Bld 137 (H) 65 - 99 mg/dL   BUN 7 6 - 20 mg/dL   Creatinine, Ser 0.82 0.44 - 1.00 mg/dL   Calcium 8.4 (L) 8.9 - 10.3 mg/dL   Total Protein 5.4 (L) 6.5 - 8.1 g/dL   Albumin 3.1 (L) 3.5 - 5.0 g/dL   AST 308 (H) 15 - 41 U/L   ALT 110 (H) 14 - 54 U/L   Alkaline Phosphatase 41 38 - 126 U/L   Total Bilirubin 0.7 0.3 - 1.2 mg/dL   GFR calc non Af Amer >60 >60 mL/min   GFR calc Af Amer >60 >60 mL/min    Comment: (NOTE) The eGFR has been calculated using the CKD EPI equation. This calculation  has not been validated in all clinical situations. eGFR's persistently <60 mL/min signify possible Chronic Kidney Disease.    Anion gap 6 5 - 15  Magnesium     Status: None   Collection Time: 12/15/16  4:07 AM  Result Value Ref Range   Magnesium 2.0 1.7 - 2.4 mg/dL  CK     Status: Abnormal   Collection Time: 12/16/16  5:36 AM  Result Value Ref Range   Total CK >50,000 (H) 38 - 234 U/L    Comment: RESULTS CONFIRMED  BY MANUAL DILUTION  CBC     Status: Abnormal   Collection Time: 12/16/16  5:36 AM  Result Value Ref Range   WBC 3.7 (L) 4.0 - 10.5 K/uL   RBC 3.92 3.87 - 5.11 MIL/uL   Hemoglobin 11.9 (L) 12.0 - 15.0 g/dL   HCT 34.7 (L) 36.0 - 46.0 %   MCV 88.5 78.0 - 100.0 fL   MCH 30.4 26.0 - 34.0 pg   MCHC 34.3 30.0 - 36.0 g/dL   RDW 12.4 11.5 - 15.5 %   Platelets 108 (L) 150 - 400 K/uL    Comment: REPEATED TO VERIFY CONSISTENT WITH PREVIOUS RESULT   Basic metabolic panel     Status: Abnormal   Collection Time: 12/16/16  5:36 AM  Result Value Ref Range   Sodium 142 135 - 145 mmol/L   Potassium 4.3 3.5 - 5.1 mmol/L   Chloride 108 101 - 111 mmol/L   CO2 24 22 - 32 mmol/L   Glucose, Bld 123 (H) 65 - 99 mg/dL   BUN 6 6 - 20 mg/dL   Creatinine, Ser 0.63 0.44 - 1.00 mg/dL   Calcium 8.6 (L) 8.9 - 10.3 mg/dL   GFR calc non Af Amer >60 >60 mL/min   GFR calc Af Amer >60 >60 mL/min    Comment: (NOTE) The eGFR has been calculated using the CKD EPI equation. This calculation has not been validated in all clinical situations. eGFR's persistently <60 mL/min signify possible Chronic Kidney Disease.    Anion gap 10 5 - 15  Hepatic function panel     Status: Abnormal   Collection Time: 12/16/16  5:36 AM  Result Value Ref Range   Total Protein 5.3 (L) 6.5 - 8.1 g/dL   Albumin 3.1 (L) 3.5 - 5.0 g/dL   AST 382 (H) 15 - 41 U/L   ALT 139 (H) 14 - 54 U/L   Alkaline Phosphatase 40 38 - 126 U/L   Total Bilirubin 0.6 0.3 - 1.2 mg/dL   Bilirubin, Direct 0.1 0.1 - 0.5 mg/dL   Indirect  Bilirubin 0.5 0.3 - 0.9 mg/dL   US Abdomen Limited Ruq  Result Date: 12/16/2016 CLINICAL DATA:  Elevated liver function tests, hypertension EXAM: US ABDOMEN LIMITED - RIGHT UPPER QUADRANT COMPARISON:  CT abdomen and pelvis of 09/13/2016 FINDINGS: Gallbladder: The gallbladder is visualized and no gallstones are noted. There is no pain over the gallbladder with compression. Common bile duct: Diameter: The common bile duct is normal measuring 3.7 mm in diameter. Liver: The liver is somewhat inhomogeneous but no focal abnormality is noted. IMPRESSION: Negative limited ultrasound of the right upper quadrant. Electronically Signed   By: Ivar Drape M.D.   On: 12/16/2016 10:24   Review of Systems  HENT: Negative.   Eyes: Negative.   Respiratory: Negative.   Cardiovascular: Negative.   Gastrointestinal: Negative.   Genitourinary: Negative.   Musculoskeletal: Positive for back pain and myalgias.  Skin: Negative.   Neurological: Positive for weakness.  Endo/Heme/Allergies: Negative.   Psychiatric/Behavioral: Negative.    Blood pressure 139/83, pulse 77, temperature 98.2 F (36.8 C), temperature source Oral, resp. rate 16, height '5\' 5"'$  (1.651 m), weight 85.5 kg (188 lb 7.9 oz), SpO2 100 %. Physical Exam  Constitutional: She is oriented to person, place, and time. She appears well-developed and well-nourished.  HENT:  Head: Normocephalic and atraumatic.  Eyes: Conjunctivae and EOM are normal. Pupils are equal, round, and reactive to light.  Neck: Normal range of motion. Neck supple.  Cardiovascular: Normal rate and regular rhythm.   Respiratory: Effort normal and breath sounds normal.  GI: Soft. Bowel sounds are normal.  Neurological: She is alert and oriented to person, place, and time.  Skin: Skin is warm and dry.  Psychiatric: She has a normal mood and affect. Her behavior is normal. Judgment and thought content normal.   Assessment/Plan: 1) Abnormal LFTs in the setting of acute  rhabdomyolysis. I suspect these will improve slowly and need to be monitored closely. Her hypokalemia has been corrected which is helpful in expediting the recovery as well. Continue liberal rehydration and close monitoring of electrolytes and LFTs.  Check hepatitis serologies. 2) Personal history of colon cancer being followed by Dr. Carol Ada on outpatient basis.  3) Personal history of GI bleed from anastomotic ulcers in the colon. 4) Family history of stomach and pancreatic cancer.  5) Personal history of DVT and PE. Brennen Gardiner 12/16/2016, 1:38 PM

## 2016-12-16 NOTE — Progress Notes (Addendum)
PROGRESS NOTE    Mallory Morales  [id number] DOB: March 07, 1956 DOA: 12/13/2016 PCP: Simona Huh, MD   Chief Complaint  Patient presents with  . Generalized Body Aches     Brief Narrative:  60 y.o. female with medical history significant of HTN, HLD, T3N0 colon cancer s/p removal, DVT, and PE; who presents with complaints of bilateral lower extremity crampy pain.   Admitted for acute rhabdo, elevated LFTs. On IVF. GI consulted.  Assessment & Plan   Muscle aches secondary to Acute Rhabdomyolysis -CPK upon admission >50000 -Possibly secondary to medications (patient has been on a statin for years, however states that over past month, may have doubled her dose as she has forgotten whether or not she took her medications) -Creatinine normal -Continue IVF and pain control as needed- changed IV to NS -Monitor CPK (still >50K) -Complains of increased LE pain, will obtain LE [place] to r/o DVT  Hypokalemia -Resolved -Potassium could be low secondary to patient's use of HCTZ. -Potassium was 3.1 on admission. Currently 4.3 -continue to monitor BMP -Magnesium level 2  Essential hypertension -Hold HCTZ   Hyperlipidemia -Hold simvastatin  Elevated LFTS -likely secondary to the above -Continue IVF -RUQ US unremarkable  -Monitor CMP -hepatitis panel pending -Gastroenterology consulted and appreciated  History of colon cancer -Review of records shows that her CEA levels have been on upward trend with the last CEA level noted as 5 (was 8 prior to that).  -CT abd/pelvis 09/13/2016 showed no recurrence   -Patient is followed by Dr. Burney Gauze. Last flexible sigmoidoscopy was in 2015 and she is due for a repeat in 2018.  History of DVT/PE -Thought to be related to patient's colon cancer, but is no longer on anticoagulation.   Constipation -Continue miralax  Thromobocytopenia -Possibly dilutional, platelets 108 -discontinued heparin, placed on SCDs -Continue to  monitor CBC  DVT Prophylaxis  SCDs  Code Status: Full  Family Communication: None at bedside  Disposition Plan: Admitted. Home when stable  Consultants Gastroenterology, Dr. Collene Mares  Procedures  RUQ [place]  Antibiotics   Anti-infectives    None      Subjective:   Mallory Morales seen and examined today.  Continues to have muscle pains in arm and legs. Denies chest pain, shortness of breath, abdominal pain, N/V/D.    Objective:   Vitals:   12/15/16 0512 12/15/16 1500 12/15/16 2109 12/16/16 0527  BP: 120/70 130/75 (!) 151/88 139/83  Pulse: 72 80 83 77  Resp: 18 16 16 16   Temp: 98.2 F (36.8 C) 98.9 F (37.2 C) 98.9 F (37.2 C) 98.2 F (36.8 C)  TempSrc: Oral Oral Oral Oral  SpO2: 96% 98% 98% 100%  Weight:      Height:        Intake/Output Summary (Last 24 hours) at 12/16/16 1332 Last data filed at 12/16/16 1101  Gross per 24 hour  Intake          1243.33 ml  Output              400 ml  Net           843.33 ml   Filed Weights   12/13/16 1724 12/14/16 0538  Weight: 80.7 kg (178 lb) 85.5 kg (188 lb 7.9 oz)    Exam  General: Well developed, well nourished, NAD  HEENT: NCAT, mucous membranes moist.   Cardiovascular: S1 S2 auscultated, RRR, no murmurs  Respiratory: Clear to auscultation bilaterally with equal chest rise  Abdomen: Soft, nontender, nondistended, +  bowel sounds  Extremities: warm dry without cyanosis clubbing or edema  Neuro: AAOx3, nonfocal  Psych: Normal affect and demeanor with intact judgement and insight, pleasant  Data Reviewed: I have personally reviewed following labs and imaging studies  CBC:  Recent Labs Lab 12/13/16 1930 12/14/16 0524 12/15/16 0407 12/16/16 0536  WBC 4.1 2.8* 3.1* 3.7*  NEUTROABS 2.7 1.2*  --   --   HGB 15.0 13.1 12.2 11.9*  HCT 41.4 36.9 35.5* 34.7*  MCV 87.0 85.8 89.6 88.5  PLT 128* 114* 95* 123XX123*   Basic Metabolic Panel:  Recent Labs Lab 12/13/16 1930 12/14/16 0524 12/15/16 0407 12/16/16 0536   NA 138 141 143 142  K 3.1* 3.1* 2.9* 4.3  CL 100* 101 97* 108  CO2 27 30 40* 24  GLUCOSE 154* 126* 137* 123*  BUN 12 6 7 6   CREATININE 0.91 0.70 0.82 0.63  CALCIUM 8.9 8.5* 8.4* 8.6*  MG  --   --  2.0  --    GFR: Estimated Creatinine Clearance: 82.8 mL/min (by C-G formula based on SCr of 0.63 mg/dL). Liver Function Tests:  Recent Labs Lab 12/14/16 0524 12/15/16 0407 12/16/16 0536  AST 222* 308* 382*  ALT 80* 110* 139*  ALKPHOS 48 41 40  BILITOT 0.8 0.7 0.6  PROT 5.6* 5.4* 5.3*  ALBUMIN 3.2* 3.1* 3.1*   No results for input(s): LIPASE, AMYLASE in the last 168 hours. No results for input(s): AMMONIA in the last 168 hours. Coagulation Profile: No results for input(s): INR, PROTIME in the last 168 hours. Cardiac Enzymes:  Recent Labs Lab 12/13/16 1930 12/14/16 0524 12/15/16 0407 12/16/16 0536  CKTOTAL >50,000* >50,000* >50,000* >50,000*   BNP (last 3 results) No results for input(s): PROBNP in the last 8760 hours. HbA1C: No results for input(s): HGBA1C in the last 72 hours. CBG: No results for input(s): GLUCAP in the last 168 hours. Lipid Profile:  Recent Labs  12/14/16 0531  CHOL 160  HDL 49  LDLCALC 93  TRIG 92  CHOLHDL 3.3   Thyroid Function Tests: No results for input(s): TSH, T4TOTAL, FREET4, T3FREE, THYROIDAB in the last 72 hours. Anemia Panel: No results for input(s): VITAMINB12, FOLATE, FERRITIN, TIBC, IRON, RETICCTPCT in the last 72 hours. Urine analysis:    Component Value Date/Time   COLORURINE YELLOW 12/13/2016 2233   APPEARANCEUR CLEAR 12/13/2016 2233   LABSPEC 1.004 (L) 12/13/2016 2233   PHURINE 7.0 12/13/2016 2233   GLUCOSEU NEGATIVE 12/13/2016 2233   HGBUR LARGE (A) 12/13/2016 2233   BILIRUBINUR NEGATIVE 12/13/2016 2233   KETONESUR NEGATIVE 12/13/2016 2233   PROTEINUR NEGATIVE 12/13/2016 2233   UROBILINOGEN 1.0 01/30/2012 1450   NITRITE NEGATIVE 12/13/2016 2233   LEUKOCYTESUR TRACE (A) 12/13/2016 2233   Sepsis  Labs: @LABRCNTIP (procalcitonin:4,lacticidven:4)  )No results found for this or any previous visit (from the past 240 hour(s)).    Radiology Studies: US Abdomen Limited Ruq  Result Date: 12/16/2016 CLINICAL DATA:  Elevated liver function tests, hypertension EXAM: US ABDOMEN LIMITED - RIGHT UPPER QUADRANT COMPARISON:  CT abdomen and pelvis of 09/13/2016 FINDINGS: Gallbladder: The gallbladder is visualized and no gallstones are noted. There is no pain over the gallbladder with compression. Common bile duct: Diameter: The common bile duct is normal measuring 3.7 mm in diameter. Liver: The liver is somewhat inhomogeneous but no focal abnormality is noted. IMPRESSION: Negative limited ultrasound of the right upper quadrant. Electronically Signed   By: Ivar Drape M.D.   On: 12/16/2016 10:24     Scheduled  Meds: . dextromethorphan-guaiFENesin  1 tablet Oral BID  . polyethylene glycol  17 g Oral Daily  . potassium chloride  40 mEq Oral BID   Continuous Infusions: . sodium chloride 125 mL/hr at 12/16/16 0326     LOS: 3 days   Time Spent in minutes   30 minutes  Braeley Buskey D.O. on 12/16/2016 at 1:32 PM  Between 7am to 7pm - Pager - [phone]  After 7pm go to [email or web address] - password [email or web address]  And look for the night coverage person covering for me after hours  [facility] Office  [phone]

## 2016-12-17 LAB — HEPATITIS PANEL, ACUTE
HCV Ab: <0.1 s/co ratio (ref 0.0–0.9)
HEP B C IGM: NEGATIVE
HEP B S AG: NEGATIVE
Hep A IgM: NEGATIVE

## 2016-12-17 LAB — COMPREHENSIVE METABOLIC PANEL
ALT: 169 U/L — AB (ref 14–54)
AST: 406 U/L — AB (ref 15–41)
Albumin: 3 g/dL — ABNORMAL LOW (ref 3.5–5.0)
Alkaline Phosphatase: 44 U/L (ref 38–126)
Anion gap: 5 (ref 5–15)
BUN: 7 mg/dL (ref 6–20)
CHLORIDE: 108 mmol/L (ref 101–111)
CO2: 28 mmol/L (ref 22–32)
CREATININE: 0.67 mg/dL (ref 0.44–1.00)
Calcium: 8.7 mg/dL — ABNORMAL LOW (ref 8.9–10.3)
GFR calc Af Amer: >60 mL/min (ref >60)
Glucose, Bld: 126 mg/dL — ABNORMAL HIGH (ref 65–99)
POTASSIUM: 4.8 mmol/L (ref 3.5–5.1)
SODIUM: 141 mmol/L (ref 135–145)
Total Bilirubin: 0.8 mg/dL (ref 0.3–1.2)
Total Protein: 5.5 g/dL — ABNORMAL LOW (ref 6.5–8.1)

## 2016-12-17 LAB — URIC ACID: URIC ACID, SERUM: 2.8 mg/dL (ref 2.3–6.6)

## 2016-12-17 LAB — C-REACTIVE PROTEIN: CRP: <0.8 mg/dL (ref <1.0)

## 2016-12-17 LAB — SEDIMENTATION RATE: Sed Rate: 25 mm/hr — ABNORMAL HIGH (ref 0–22)

## 2016-12-17 LAB — CBC
HCT: 35.5 % — ABNORMAL LOW (ref 36.0–46.0)
Hemoglobin: 12.1 g/dL (ref 12.0–15.0)
MCH: 29.8 pg (ref 26.0–34.0)
MCHC: 34.1 g/dL (ref 30.0–36.0)
MCV: 87.4 fL (ref 78.0–100.0)
PLATELETS: 130 10*3/uL — AB (ref 150–400)
RBC: 4.06 MIL/uL (ref 3.87–5.11)
RDW: 12.1 % (ref 11.5–15.5)
WBC: 4.4 10*3/uL (ref 4.0–10.5)

## 2016-12-17 LAB — CK

## 2016-12-17 MED ORDER — SODIUM CHLORIDE 0.9 % IV SOLN
INTRAVENOUS | Status: AC
  Administered 2016-12-17 – 2016-12-18 (×3): via INTRAVENOUS

## 2016-12-17 NOTE — Progress Notes (Signed)
Subjective: Sore legs.  Objective: Vital signs in last 24 hours: Temp:  [97.8 F (36.6 C)-98.6 F (37 C)] 98.5 F (36.9 C) (12/27 0426) Pulse Rate:  [73-95] 73 (12/27 0426) Resp:  [16-18] 18 (12/27 0426) BP: (125-154)/(75-89) 125/75 (12/27 0426) SpO2:  [99 %-100 %] 99 % (12/27 0426) Last BM Date: 12/15/16  Intake/Output from previous day: 12/26 0701 - 12/27 0700 In: 3140 [P.O.:1140; I.V.:2000] Out: 2200 [Urine:2200] Intake/Output this shift: No intake/output data recorded.  General appearance: alert and no distress GI: soft, non-tender; bowel sounds normal; no masses,  no organomegaly  Lab Results:  Recent Labs  12/15/16 0407 12/16/16 0536 12/17/16 0407  WBC 3.1* 3.7* 4.4  HGB 12.2 11.9* 12.1  HCT 35.5* 34.7* 35.5*  PLT 95* 108* 130*   BMET  Recent Labs  12/15/16 0407 12/16/16 0536 12/17/16 0407  NA 143 142 141  K 2.9* 4.3 4.8  CL 97* 108 108  CO2 40* 24 28  GLUCOSE 137* 123* 126*  BUN 7 6 7   CREATININE 0.82 0.63 0.67  CALCIUM 8.4* 8.6* 8.7*   LFT  Recent Labs  12/16/16 0536 12/17/16 0407  PROT 5.3* 5.5*  ALBUMIN 3.1* 3.0*  AST 382* 406*  ALT 139* 169*  ALKPHOS 40 44  BILITOT 0.6 0.8  BILIDIR 0.1  --   IBILI 0.5  --    PT/INR No results for input(s): LABPROT, INR in the last 72 hours. Hepatitis Panel  Recent Labs  12/16/16 1816  HEPBSAG Negative  HCVAB <0.1  HEPAIGM Negative  HEPBIGM Negative   C-Diff No results for input(s): CDIFFTOX in the last 72 hours. Fecal Lactopherrin No results for input(s): FECLLACTOFRN in the last 72 hours.  Studies/Results: US Abdomen Limited Ruq  Result Date: 12/16/2016 CLINICAL DATA:  Elevated liver function tests, hypertension EXAM: US ABDOMEN LIMITED - RIGHT UPPER QUADRANT COMPARISON:  CT abdomen and pelvis of 09/13/2016 FINDINGS: Gallbladder: The gallbladder is visualized and no gallstones are noted. There is no pain over the gallbladder with compression. Common bile duct: Diameter: The common  bile duct is normal measuring 3.7 mm in diameter. Liver: The liver is somewhat inhomogeneous but no focal abnormality is noted. IMPRESSION: Negative limited ultrasound of the right upper quadrant. Electronically Signed   By: Ivar Drape M.D.   On: 12/16/2016 10:24    Medications:  Scheduled: . dextromethorphan-guaiFENesin  1 tablet Oral BID  . polyethylene glycol  17 g Oral Daily  . potassium chloride  40 mEq Oral BID   Continuous: . sodium chloride 150 mL/hr at 12/17/16 1140    Assessment/Plan: 1) Rhabdomyolysis secondary to simvastatin. 2) Abnormal liver enzymes.   With further discussion, the patient reported some vague issues for the past several months.  Her metallic taste resolved with cessation of simvastatin.  I believe her symptoms are as a result of the statin and she needs to avoid this class of medication in the future.  The abnormal liver enzymes are not reflective of hepatic dysfunction, but from the rhabdomyolysis.  Her legs feel better with ambulation and she is able to stand up without significant difficulty.  Dermatomyositis is another consideration if her numbers to not improve/resolve.  In that instance, Rheumatology will need to be consulted.  Plan: 1) Continue with IV hydration.   LOS: 4 days   Kauan Kloosterman D 12/17/2016, 1:19 PM

## 2016-12-17 NOTE — Progress Notes (Signed)
PROGRESS NOTE    Mallory Morales  NKN:[id number] DOB: May 27, 1956 DOA: 12/13/2016 PCP: Simona Huh, MD   Chief Complaint  Patient presents with  . Generalized Body Aches     Brief Narrative:  60 y.o. female with medical history significant of HTN, HLD, T3N0 colon cancer s/p removal, DVT, and PE; who presents with complaints of bilateral lower extremity crampy pain.   Admitted for acute rhabdo, elevated LFTs. On IVF. GI consulted.  Assessment & Plan   Muscle aches secondary to Acute Rhabdomyolysis -CPK upon admission >50000 -Possibly secondary to medications (patient has been on a statin for years, however states that over past month, may have doubled her dose as she has forgotten whether or not she took her medications) -Creatinine normal -Continue IVF and pain control as needed- changed IV to NS. Symptomatically improving but CK remains >50,000 (may be coming down but cannot tell). GI follow-up appreciated and agree that abnormal LFTs likely related to her rhabdomyolysis and she should avoid statins in the future. Dermatomyositis is on the differentials if she does not improve. -Lower extremity venous Dopplers negative for DVT. - CK has been >50,000 since admission on 12/23 and has not improved despite IV fluids.  Increase normal saline to 200 mL per hour until tomorrow and follow CK. Check Aldolase, ESR, CRP and ANA.  Hypokalemia -Resolved -Potassium could be low secondary to patient's use of HCTZ. -Potassium was 3.1 on admission. Currently 4.8 -continue to monitor BMP -Magnesium level 2  Essential hypertension -Hold HCTZ   Hyperlipidemia - DC simvastatin  Elevated LFTS -likely secondary to the above -Continue IVF -RUQ US unremarkable  -Monitor CMP -hepatitis panel negative -Gastroenterology consulted and appreciated input.  History of colon cancer -Review of records shows that her CEA levels have been on upward trend with the last CEA level noted as 5 (was  8 prior to that).  -CT abd/pelvis 09/13/2016 showed no recurrence   -Patient is followed by Dr. Burney Gauze. Last flexible sigmoidoscopy was in 2015 and she is due for a repeat in 2018.  History of DVT/PE -Thought to be related to patient's colon cancer, but is no longer on anticoagulation. Lower extremity venous Dopplers negative for DVT.  Constipation -Continue miralax  Thromobocytopenia - Chronic and intermittent. Stable. Heparin discontinued and on SCDs for DVT prophylaxis.  DVT Prophylaxis  SCDs  Code Status: Full  Family Communication: None at bedside  Disposition Plan: Admitted. Home when stable  Consultants Gastroenterology, Dr. Collene Mares  Procedures  RUQ [place]  Antibiotics   Anti-infectives    None      Subjective:   Mallory Morales seen and examined today.  Complains of cramps in her calf but better. Denies any other complaints. No dyspnea reported. Good urine output. Denies muscle weakness.  Objective:   Vitals:   12/16/16 0527 12/16/16 1537 12/16/16 2103 12/17/16 0426  BP: 139/83 128/75 (!) 154/89 125/75  Pulse: 77 95 84 73  Resp: _0 Temp: 98.2 F (36.8 C) 97.8 F (36.6 C) 98.6 F (37 C) 98.5 F (36.9 C)  TempSrc: Oral Oral Oral Oral  SpO2: 100% 100% 100% 99%  Weight:      Height:        Intake/Output Summary (Last 24 hours) at 12/17/16 1523 Last data filed at 12/17/16 0427  Gross per 24 hour  Intake             2920 ml  Output  1700 ml  Net             1220 ml   Filed Weights   12/13/16 1724 12/14/16 0538  Weight: 80.7 kg (178 lb) 85.5 kg (188 lb 7.9 oz)    Exam  General: Well developed, well nourished, NAD. Sitting up comfortably in bed eating breakfast this morning.  HEENT: NCAT, mucous membranes moist.   Cardiovascular: S1 S2 auscultated, RRR, no murmurs  Respiratory: Clear to auscultation bilaterally with equal chest rise  Abdomen: Soft, nontender, nondistended, + bowel sounds  Extremities: warm dry without  cyanosis clubbing or edema  Neuro: AAOx3, nonfocal  Psych: Normal affect and demeanor with intact judgement and insight, pleasant  Data Reviewed: I have personally reviewed following labs and imaging studies  CBC:  Recent Labs Lab 12/13/16 1930 12/14/16 0524 12/15/16 0407 12/16/16 0536 12/17/16 0407  WBC 4.1 2.8* 3.1* 3.7* 4.4  NEUTROABS 2.7 1.2*  --   --   --   HGB 15.0 13.1 12.2 11.9* 12.1  HCT 41.4 36.9 35.5* 34.7* 35.5*  MCV 87.0 85.8 89.6 88.5 87.4  PLT 128* 114* 95* 108* 025*   Basic Metabolic Panel:  Recent Labs Lab 12/13/16 1930 12/14/16 0524 12/15/16 0407 12/16/16 0536 12/17/16 0407  NA 138 141 143 142 141  K 3.1* 3.1* 2.9* 4.3 4.8  CL 100* 101 97* 108 108  CO2 27 30 40* 24 28  GLUCOSE 154* 126* 137* 123* 126*  BUN _0 CREATININE 0.91 0.70 0.82 0.63 0.67  CALCIUM 8.9 8.5* 8.4* 8.6* 8.7*  MG  --   --  2.0  --   --    GFR: Estimated Creatinine Clearance: 82.8 mL/min (by C-G formula based on SCr of 0.67 mg/dL). Liver Function Tests:  Recent Labs Lab 12/14/16 0524 12/15/16 0407 12/16/16 0536 12/17/16 0407  AST 222* 308* 382* 406*  ALT 80* 110* 139* 169*  ALKPHOS 48 41 40 44  BILITOT 0.8 0.7 0.6 0.8  PROT 5.6* 5.4* 5.3* 5.5*  ALBUMIN 3.2* 3.1* 3.1* 3.0*   No results for input(s): LIPASE, AMYLASE in the last 168 hours. No results for input(s): AMMONIA in the last 168 hours. Coagulation Profile: No results for input(s): INR, PROTIME in the last 168 hours. Cardiac Enzymes:  Recent Labs Lab 12/13/16 1930 12/14/16 0524 12/15/16 0407 12/16/16 0536 12/17/16 0407  CKTOTAL >50,000* >50,000* >50,000* >50,000* >50,000*   BNP (last 3 results) No results for input(s): PROBNP in the last 8760 hours. HbA1C: No results for input(s): HGBA1C in the last 72 hours. CBG: No results for input(s): GLUCAP in the last 168 hours. Lipid Profile: No results for input(s): CHOL, HDL, LDLCALC, TRIG, CHOLHDL, LDLDIRECT in the last 72 hours. Thyroid  Function Tests: No results for input(s): TSH, T4TOTAL, FREET4, T3FREE, THYROIDAB in the last 72 hours. Anemia Panel: No results for input(s): VITAMINB12, FOLATE, FERRITIN, TIBC, IRON, RETICCTPCT in the last 72 hours. Urine analysis:    Component Value Date/Time   COLORURINE YELLOW 12/13/2016 2233   APPEARANCEUR CLEAR 12/13/2016 2233   LABSPEC 1.004 (L) 12/13/2016 2233   PHURINE 7.0 12/13/2016 2233   GLUCOSEU NEGATIVE 12/13/2016 2233   HGBUR LARGE (A) 12/13/2016 2233   BILIRUBINUR NEGATIVE 12/13/2016 2233   KETONESUR NEGATIVE 12/13/2016 2233   PROTEINUR NEGATIVE 12/13/2016 2233   UROBILINOGEN 1.0 01/30/2012 1450   NITRITE NEGATIVE 12/13/2016 2233   LEUKOCYTESUR TRACE (A) 12/13/2016 2233      Radiology Studies: US Abdomen Limited Ruq  Result Date: 12/16/2016  CLINICAL DATA:  Elevated liver function tests, hypertension EXAM: US ABDOMEN LIMITED - RIGHT UPPER QUADRANT COMPARISON:  CT abdomen and pelvis of 09/13/2016 FINDINGS: Gallbladder: The gallbladder is visualized and no gallstones are noted. There is no pain over the gallbladder with compression. Common bile duct: Diameter: The common bile duct is normal measuring 3.7 mm in diameter. Liver: The liver is somewhat inhomogeneous but no focal abnormality is noted. IMPRESSION: Negative limited ultrasound of the right upper quadrant. Electronically Signed   By: Ivar Drape M.D.   On: 12/16/2016 10:24     Scheduled Meds: . dextromethorphan-guaiFENesin  1 tablet Oral BID  . polyethylene glycol  17 g Oral Daily  . potassium chloride  40 mEq Oral BID   Continuous Infusions: . sodium chloride 150 mL/hr at 12/17/16 1140     LOS: 4 days   Salmaan Patchin, MD, FACP, FHM. [facility] Pager [phone]  If 7PM-7AM, please contact night-coverage [email or web address] Password [email or web address] 12/17/2016, 3:35 PM

## 2016-12-18 ENCOUNTER — Inpatient Hospital Stay (HOSPITAL_COMMUNITY): Payer: BLUE CROSS/BLUE SHIELD

## 2016-12-18 LAB — HEPATIC FUNCTION PANEL
ALBUMIN: 3 g/dL — AB (ref 3.5–5.0)
ALK PHOS: 50 U/L (ref 38–126)
ALT: 185 U/L — AB (ref 14–54)
AST: 346 U/L — ABNORMAL HIGH (ref 15–41)
BILIRUBIN TOTAL: 0.4 mg/dL (ref 0.3–1.2)
Bilirubin, Direct: 0.1 mg/dL (ref 0.1–0.5)
Indirect Bilirubin: 0.3 mg/dL (ref 0.3–0.9)
Total Protein: 5.7 g/dL — ABNORMAL LOW (ref 6.5–8.1)

## 2016-12-18 LAB — RENAL FUNCTION PANEL
Albumin: 3 g/dL — ABNORMAL LOW (ref 3.5–5.0)
Anion gap: 7 (ref 5–15)
BUN: 8 mg/dL (ref 6–20)
CHLORIDE: 107 mmol/L (ref 101–111)
CO2: 25 mmol/L (ref 22–32)
CREATININE: 0.64 mg/dL (ref 0.44–1.00)
Calcium: 8.7 mg/dL — ABNORMAL LOW (ref 8.9–10.3)
GFR calc non Af Amer: >60 mL/min (ref >60)
Glucose, Bld: 112 mg/dL — ABNORMAL HIGH (ref 65–99)
Phosphorus: 4 mg/dL (ref 2.5–4.6)
Potassium: 4.3 mmol/L (ref 3.5–5.1)
Sodium: 139 mmol/L (ref 135–145)

## 2016-12-18 LAB — ALDOLASE: Aldolase: <1.2 U/L (ref 3.3–10.3)

## 2016-12-18 LAB — CK

## 2016-12-18 LAB — ANTINUCLEAR ANTIBODIES, IFA: ANTINUCLEAR ANTIBODIES, IFA: NEGATIVE

## 2016-12-18 MED ORDER — SODIUM CHLORIDE 0.9 % IV SOLN
INTRAVENOUS | Status: DC
  Administered 2016-12-18 – 2016-12-23 (×17): via INTRAVENOUS

## 2016-12-18 NOTE — Progress Notes (Signed)
PROGRESS NOTE    Mallory Morales  XBM:[id number] DOB: Apr 30, 1956 DOA: 12/13/2016 PCP: Simona Huh, MD   Chief Complaint  Patient presents with  . Generalized Body Aches     Brief Narrative:  60 y.o. female with medical history significant of HTN, HLD, T3N0 colon cancer s/p removal, DVT, and PE; who presents with complaints of bilateral lower extremity crampy pain. Admitted for acute rhabdo, elevated LFTs. On IVF. GI consulted.   Additional history: Patient states that she was in her usual state of health until 4-5 days before Christmas when she developed "scratchy throat", high fever and generalized body aches. She took some OTC medications including TheraFlu and her symptoms resolved. Couple of days later, she noted worsening pain in her lower extremities, especially in her coughs when extent where she was unable to walk which brought her to the hospital. She may have taken doubled the dose of her statins for a couple days prior to admission.  Assessment & Plan   Muscle aches secondary to Acute Rhabdomyolysis - Since admission, her statins were held, she was aggressively hydrated with IV fluids and has had good urine output with normal creatinine. Despite these measures, [name] remains >50 K. Her rhabdomyolysis was initially felt to be related to statins and she may have taken more than prescribed doses for a couple of days prior to admission. GI has seen and suspect that her mildly elevated LFTs are secondary to rhabdomyolysis and have signed off on 12/28. Lower extremity venous Dopplers negative for DVT. Aldolase and CRP negative. ANA negative. ESR minimally elevated at 25. Uric acid normal. - On 12/28, discussed her care extensively with the rheumatologist/Dr. Amil Amen who advised that her clinical picture is still suspicious for rhabdomyolysis which may be post acute viral illness recently compounded by statins. Given how high the CK is, he feels that polymyositis and dermatomyositis  seem less likely. Also her symptoms are more distal than proximal which argues against polymyositis. We agreed on obtaining MRI of her lower extremities for possible myositis. He did not recommend obtaining a biopsy or steroids at this time. He indicated that if patient's pain was better, otherwise stable, she could be discharged and would kindly see her in outpatient consultation.  Hypokalemia -Resolved - Magnesium level 2  Essential hypertension -Hold HCTZ   Hyperlipidemia - DC simvastatin  Elevated LFTS -likely secondary to the above -Continue IVF -RUQ US unremarkable  -Monitor CMP -hepatitis panel negative -Gastroenterology consulted and appreciated input. GI signed off.  History of colon cancer -Review of records shows that her CEA levels have been on upward trend with the last CEA level noted as 5 (was 8 prior to that).  -CT abd/pelvis 09/13/2016 showed no recurrence   -Patient is followed by Dr. Burney Gauze. Last flexible sigmoidoscopy was in 2015 and she is due for a repeat in 2018.  History of DVT/PE -Thought to be related to patient's colon cancer, but is no longer on anticoagulation. Lower extremity venous Dopplers negative for DVT.  Constipation -Continue miralax  Thromobocytopenia - Chronic and intermittent. Stable. Heparin discontinued and on SCDs for DVT prophylaxis.  DVT Prophylaxis  SCDs  Code Status: Full  Family Communication: None at bedside  Disposition Plan: Admitted. Home when stable-possibly 12/29  Consultants Gastroenterology, Dr. Collene Mares  Procedures  RUQ [place]  Antibiotics   Anti-infectives    None      Subjective:   Mallory Morales seen and examined today.  Mostly calf cramps. Ambulating without difficulty. Not much weakness  in her limbs.  Objective:   Vitals:   12/17/16 1658 12/17/16 2219 12/18/16 0500 12/18/16 1319  BP: 126/80 130/78 132/78 126/76  Pulse: 81 73 71 71  Resp: _0 Temp: 98.6 F (37 C) 99.1 F (37.3 C)  98.8 F (37.1 C) 98.6 F (37 C)  TempSrc: Oral Oral  Oral  SpO2: 100% 100% 100% 100%  Weight:      Height:        Intake/Output Summary (Last 24 hours) at 12/18/16 1804 Last data filed at 12/18/16 1500  Gross per 24 hour  Intake             2680 ml  Output             4250 ml  Net            -1570 ml   Filed Weights   12/13/16 1724 12/14/16 0538  Weight: 80.7 kg (178 lb) 85.5 kg (188 lb 7.9 oz)    Exam  General: Well developed, well nourished, NAD. Sitting up comfortably in bed eating breakfast this morning.  HEENT: NCAT, mucous membranes moist.   Cardiovascular: S1 S2 auscultated, RRR, no murmurs  Respiratory: Clear to auscultation bilaterally with equal chest rise  Abdomen: Soft, nontender, nondistended, + bowel sounds  Extremities: warm dry without cyanosis clubbing or edema. Grade 5 x 5 power in all extremities. Mild calf tenderness bilaterally.  Neuro: AAOx3, nonfocal  Psych: Normal affect and demeanor with intact judgement and insight, pleasant  Data Reviewed: I have personally reviewed following labs and imaging studies  CBC:  Recent Labs Lab 12/13/16 1930 12/14/16 0524 12/15/16 0407 12/16/16 0536 12/17/16 0407  WBC 4.1 2.8* 3.1* 3.7* 4.4  NEUTROABS 2.7 1.2*  --   --   --   HGB 15.0 13.1 12.2 11.9* 12.1  HCT 41.4 36.9 35.5* 34.7* 35.5*  MCV 87.0 85.8 89.6 88.5 87.4  PLT 128* 114* 95* 108* 563*   Basic Metabolic Panel:  Recent Labs Lab 12/14/16 0524 12/15/16 0407 12/16/16 0536 12/17/16 0407 12/18/16 0356  NA 141 143 142 141 139  K 3.1* 2.9* 4.3 4.8 4.3  CL 101 97* 108 108 107  CO2 30 40* _1 GLUCOSE 126* 137* 123* 126* 112*  BUN _2 CREATININE 0.70 0.82 0.63 0.67 0.64  CALCIUM 8.5* 8.4* 8.6* 8.7* 8.7*  MG  --  2.0  --   --   --   PHOS  --   --   --   --  4.0   GFR: Estimated Creatinine Clearance: 82.8 mL/min (by C-G formula based on SCr of 0.64 mg/dL). Liver Function Tests:  Recent Labs Lab 12/14/16 0524  12/15/16 0407 12/16/16 0536 12/17/16 0407 12/18/16 0356  AST 222* 308* 382* 406* 346*  ALT 80* 110* 139* 169* 185*  ALKPHOS 48 41 40 44 50  BILITOT 0.8 0.7 0.6 0.8 0.4  PROT 5.6* 5.4* 5.3* 5.5* 5.7*  ALBUMIN 3.2* 3.1* 3.1* 3.0* 3.0*  3.0*   No results for input(s): LIPASE, AMYLASE in the last 168 hours. No results for input(s): AMMONIA in the last 168 hours. Coagulation Profile: No results for input(s): INR, PROTIME in the last 168 hours. Cardiac Enzymes:  Recent Labs Lab 12/14/16 0524 12/15/16 0407 12/16/16 0536 12/17/16 0407 12/18/16 0356  CKTOTAL >50,000* >50,000* >50,000* >50,000* >50,000*   BNP (last 3 results) No results for input(s): PROBNP in the last 8760 hours. HbA1C: No results for input(s):  HGBA1C in the last 72 hours. CBG: No results for input(s): GLUCAP in the last 168 hours. Lipid Profile: No results for input(s): CHOL, HDL, LDLCALC, TRIG, CHOLHDL, LDLDIRECT in the last 72 hours. Thyroid Function Tests: No results for input(s): TSH, T4TOTAL, FREET4, T3FREE, THYROIDAB in the last 72 hours. Anemia Panel: No results for input(s): VITAMINB12, FOLATE, FERRITIN, TIBC, IRON, RETICCTPCT in the last 72 hours. Urine analysis:    Component Value Date/Time   COLORURINE YELLOW 12/13/2016 2233   APPEARANCEUR CLEAR 12/13/2016 2233   LABSPEC 1.004 (L) 12/13/2016 2233   PHURINE 7.0 12/13/2016 2233   GLUCOSEU NEGATIVE 12/13/2016 2233   HGBUR LARGE (A) 12/13/2016 2233   BILIRUBINUR NEGATIVE 12/13/2016 2233   KETONESUR NEGATIVE 12/13/2016 2233   PROTEINUR NEGATIVE 12/13/2016 2233   UROBILINOGEN 1.0 01/30/2012 1450   NITRITE NEGATIVE 12/13/2016 2233   LEUKOCYTESUR TRACE (A) 12/13/2016 2233      Radiology Studies: No results found.   Scheduled Meds: . dextromethorphan-guaiFENesin  1 tablet Oral BID  . polyethylene glycol  17 g Oral Daily  . potassium chloride  40 mEq Oral BID   Continuous Infusions: . sodium chloride       LOS: 5 days    Juanell Saffo, MD, FACP, FHM. [facility] Pager [phone]  If 7PM-7AM, please contact night-coverage [email or web address] Password [facility] 12/18/2016, 6:04 PM

## 2016-12-18 NOTE — Progress Notes (Signed)
Subjective: No new complaints, but no change with her muscle leg soreness.  Objective: Vital signs in last 24 hours: Temp:  [98.6 F (37 C)-99.1 F (37.3 C)] 98.6 F (37 C) (12/28 1319) Pulse Rate:  [71-81] 71 (12/28 1319) Resp:  [18] 18 (12/28 1319) BP: (126-132)/(76-80) 126/76 (12/28 1319) SpO2:  [100 %] 100 % (12/28 1319) Last BM Date: 12/17/16  Intake/Output from previous day: 12/27 0701 - 12/28 0700 In: 4026 [P.O.:826; I.V.:3200] Out: 3400 [Urine:3400] Intake/Output this shift: Total I/O In: 560 [P.O.:560] Out: 1850 [Urine:1850]  General appearance: alert and no distress GI: soft, non-tender; bowel sounds normal; no masses,  no organomegaly  Lab Results:  Recent Labs  12/16/16 0536 12/17/16 0407  WBC 3.7* 4.4  HGB 11.9* 12.1  HCT 34.7* 35.5*  PLT 108* 130*   BMET  Recent Labs  12/16/16 0536 12/17/16 0407 12/18/16 0356  NA 142 141 139  K 4.3 4.8 4.3  CL 108 108 107  CO2 24 28 25   GLUCOSE 123* 126* 112*  BUN 6 7 8   CREATININE 0.63 0.67 0.64  CALCIUM 8.6* 8.7* 8.7*   LFT  Recent Labs  12/18/16 0356  PROT 5.7*  ALBUMIN 3.0*  3.0*  AST 346*  ALT 185*  ALKPHOS 50  BILITOT 0.4  BILIDIR 0.1  IBILI 0.3   PT/INR No results for input(s): LABPROT, INR in the last 72 hours. Hepatitis Panel  Recent Labs  12/16/16 1816  HEPBSAG Negative  HCVAB <0.1  HEPAIGM Negative  HEPBIGM Negative   C-Diff No results for input(s): CDIFFTOX in the last 72 hours. Fecal Lactopherrin No results for input(s): FECLLACTOFRN in the last 72 hours.  Studies/Results: No results found.  Medications:  Scheduled: . dextromethorphan-guaiFENesin  1 tablet Oral BID  . polyethylene glycol  17 g Oral Daily  . potassium chloride  40 mEq Oral BID   Continuous:   Assessment/Plan: 1) Rhabdomyolysis. 2) Abnormal liver enzymes secondary to the rhabdo.   Clinically she has not improved, but she is not any worse.  There is some mild improvement in her liver enzymes.   I do not feel that there is any hepatic issues at this time.  I am still hoping that her symptoms are secondary to simvastatin, but [name] is still markedly elevated.  Creatinine is normal.  Plan: 1) Supportive care. 2) No further GI evaluation.  Signing off.  LOS: 5 days   Mallory Morales D 12/18/2016, 4:53 PM

## 2016-12-19 LAB — CK: Total CK: 46904 U/L — ABNORMAL HIGH (ref 38–234)

## 2016-12-19 MED ORDER — TRAMADOL HCL 50 MG PO TABS
50.0000 mg | ORAL_TABLET | Freq: Four times a day (QID) | ORAL | Status: DC | PRN
  Administered 2016-12-19 – 2016-12-24 (×7): 50 mg via ORAL
  Filled 2016-12-19 (×7): qty 1

## 2016-12-19 NOTE — Progress Notes (Signed)
Patient ID: Mallory Morales, female   DOB: June 18, 1956, 60 y.o.   MRN: [id number]   Called by nursing regarding pain medications for leg pain.  Despite normal kidney function, would refrain from NSAIDs.  Tramadol ordered q6h PRN mod-severe pain.

## 2016-12-19 NOTE — Progress Notes (Signed)
PROGRESS NOTE    Mallory Morales  CBS:[id number] DOB: Mar 15, 1956 DOA: 12/13/2016 PCP: Simona Huh, MD   Chief Complaint  Patient presents with  . Generalized Body Aches     Brief Narrative:  60 y.o. female with medical history significant of HTN, HLD, T3N0 colon cancer s/p removal, DVT, and PE; who presents with complaints of bilateral lower extremity crampy pain. Admitted for acute rhabdo, elevated LFTs. On IVF. GI consulted.   Additional history: Patient states that she was in her usual state of health until 4-5 days before Christmas when she developed "scratchy throat", high fever and generalized body aches. She took some OTC medications including TheraFlu and her symptoms resolved. Couple of days later, she noted worsening pain in her lower extremities, especially in her coughs when extent where she was unable to walk which brought her to the hospital. She may have taken doubled the dose of her statins for a couple days prior to admission.  Assessment & Plan   Muscle aches secondary to Acute Rhabdomyolysis - Since admission, her statins were held, she was aggressively hydrated with IV fluids and has had good urine output with normal creatinine. Despite these measures, [name] remains >50 K. Her rhabdomyolysis was initially felt to be related to statins and she may have taken more than prescribed doses for a couple of days prior to admission. GI has seen and suspect that her mildly elevated LFTs are secondary to rhabdomyolysis and have signed off on 12/28. Lower extremity venous Dopplers negative for DVT. Aldolase and CRP negative. ANA negative. ESR minimally elevated at 25. Uric acid normal. - On 12/28, discussed her care extensively with the rheumatologist/Dr. Amil Amen who advised that her clinical picture is still suspicious for rhabdomyolysis which may be post acute viral illness recently compounded by statins. Given how high the CK is, he feels that polymyositis and dermatomyositis  seem less likely. Also her symptoms are more distal than proximal which argues against polymyositis. We agreed on obtaining MRI of her lower extremities for possible myositis. He did not recommend obtaining a biopsy or steroids at this time. He indicated that if patient's pain was better, otherwise stable, she could be discharged and would kindly see her in outpatient consultation. - MRI of lower extremities shows nonspecific myositis of the posterior compartments of the thighs along with cough muscles bilaterally. No findings for cellulitis, septic arthritis or osteomyelitis. - For the first time finally CK has decreased and is 46.9K. Continue IV fluids and follow CK in a.m. Symptomatically also she feels better.  Hypokalemia -Resolved - Magnesium level 2  Essential hypertension -Hold HCTZ   Hyperlipidemia - DC simvastatin  Elevated LFTS -likely secondary to the above -Continue IVF -RUQ US unremarkable  -Monitor CMP -hepatitis panel negative -Gastroenterology consulted and appreciated input. GI signed off.  History of colon cancer -Review of records shows that her CEA levels have been on upward trend with the last CEA level noted as 5 (was 8 prior to that).  -CT abd/pelvis 09/13/2016 showed no recurrence   -Patient is followed by Dr. Burney Gauze. Last flexible sigmoidoscopy was in 2015 and she is due for a repeat in 2018.  History of DVT/PE -Thought to be related to patient's colon cancer, but is no longer on anticoagulation. Lower extremity venous Dopplers negative for DVT.  Constipation -Continue miralax  Thromobocytopenia - Chronic and intermittent. Stable. Heparin discontinued and on SCDs for DVT prophylaxis.  DVT Prophylaxis  SCDs  Code Status: Full  Family Communication: None  at bedside  Disposition Plan: Admitted. Home when stable-possibly 12/29  Consultants Gastroenterology, Dr. Collene Mares  Procedures  RUQ [place]  Antibiotics   Anti-infectives    None       Subjective:   Mallory Morales seen and examined today.  Mostly calf cramps-definitely better today than last few days. Ambulating without difficulty. Not much weakness in her limbs.  Objective:   Vitals:   12/18/16 1319 12/18/16 2142 12/19/16 0541 12/19/16 1443  BP: 126/76 138/82 136/74 134/88  Pulse: 71 78 71 75  Resp: _0 Temp: 98.6 F (37 C) 98.2 F (36.8 C) 98.4 F (36.9 C) 98.4 F (36.9 C)  TempSrc: Oral Oral Oral Oral  SpO2: 100% 100% 100% 100%  Weight:      Height:        Intake/Output Summary (Last 24 hours) at 12/19/16 1528 Last data filed at 12/19/16 1443  Gross per 24 hour  Intake          4633.34 ml  Output             4075 ml  Net           558.34 ml   Filed Weights   12/13/16 1724 12/14/16 0538  Weight: 80.7 kg (178 lb) 85.5 kg (188 lb 7.9 oz)    Exam  General: Well developed, well nourished, NAD. Sitting up comfortably in bed.  HEENT: NCAT, mucous membranes moist.   Cardiovascular: S1 S2 auscultated, RRR, no murmurs  Respiratory: Clear to auscultation bilaterally with equal chest rise  Abdomen: Soft, nontender, nondistended, + bowel sounds  Extremities: warm dry without cyanosis clubbing or edema. Grade 5 x 5 power in all extremities. Mild calf tenderness bilaterally.  Neuro: AAOx3, nonfocal  Psych: Normal affect and demeanor with intact judgement and insight, pleasant  Data Reviewed: I have personally reviewed following labs and imaging studies  CBC:  Recent Labs Lab 12/13/16 1930 12/14/16 0524 12/15/16 0407 12/16/16 0536 12/17/16 0407  WBC 4.1 2.8* 3.1* 3.7* 4.4  NEUTROABS 2.7 1.2*  --   --   --   HGB 15.0 13.1 12.2 11.9* 12.1  HCT 41.4 36.9 35.5* 34.7* 35.5*  MCV 87.0 85.8 89.6 88.5 87.4  PLT 128* 114* 95* 108* 300*   Basic Metabolic Panel:  Recent Labs Lab 12/14/16 0524 12/15/16 0407 12/16/16 0536 12/17/16 0407 12/18/16 0356  NA 141 143 142 141 139  K 3.1* 2.9* 4.3 4.8 4.3  CL 101 97* 108 108 107  CO2  30 40* _1 GLUCOSE 126* 137* 123* 126* 112*  BUN _2 CREATININE 0.70 0.82 0.63 0.67 0.64  CALCIUM 8.5* 8.4* 8.6* 8.7* 8.7*  MG  --  2.0  --   --   --   PHOS  --   --   --   --  4.0   GFR: Estimated Creatinine Clearance: 82.8 mL/min (by C-G formula based on SCr of 0.64 mg/dL). Liver Function Tests:  Recent Labs Lab 12/14/16 0524 12/15/16 0407 12/16/16 0536 12/17/16 0407 12/18/16 0356  AST 222* 308* 382* 406* 346*  ALT 80* 110* 139* 169* 185*  ALKPHOS 48 41 40 44 50  BILITOT 0.8 0.7 0.6 0.8 0.4  PROT 5.6* 5.4* 5.3* 5.5* 5.7*  ALBUMIN 3.2* 3.1* 3.1* 3.0* 3.0*  3.0*   No results for input(s): LIPASE, AMYLASE in the last 168 hours. No results for input(s): AMMONIA in the last 168 hours. Coagulation Profile: No results for input(s):  INR, PROTIME in the last 168 hours. Cardiac Enzymes:  Recent Labs Lab 12/15/16 0407 12/16/16 0536 12/17/16 0407 12/18/16 0356 12/19/16 0418  CKTOTAL >50,000* >50,000* >50,000* >50,000* 63,016*   BNP (last 3 results) No results for input(s): PROBNP in the last 8760 hours. HbA1C: No results for input(s): HGBA1C in the last 72 hours. CBG: No results for input(s): GLUCAP in the last 168 hours. Lipid Profile: No results for input(s): CHOL, HDL, LDLCALC, TRIG, CHOLHDL, LDLDIRECT in the last 72 hours. Thyroid Function Tests: No results for input(s): TSH, T4TOTAL, FREET4, T3FREE, THYROIDAB in the last 72 hours. Anemia Panel: No results for input(s): VITAMINB12, FOLATE, FERRITIN, TIBC, IRON, RETICCTPCT in the last 72 hours. Urine analysis:    Component Value Date/Time   COLORURINE YELLOW 12/13/2016 2233   APPEARANCEUR CLEAR 12/13/2016 2233   LABSPEC 1.004 (L) 12/13/2016 2233   PHURINE 7.0 12/13/2016 2233   GLUCOSEU NEGATIVE 12/13/2016 2233   HGBUR LARGE (A) 12/13/2016 2233   BILIRUBINUR NEGATIVE 12/13/2016 2233   KETONESUR NEGATIVE 12/13/2016 2233   PROTEINUR NEGATIVE 12/13/2016 2233   UROBILINOGEN 1.0 01/30/2012 1450    NITRITE NEGATIVE 12/13/2016 2233   LEUKOCYTESUR TRACE (A) 12/13/2016 2233      Radiology Studies: Mr Tibia Fibula Right Wo Contrast  Result Date: 12/18/2016 CLINICAL DATA:  Bilateral lower extremity pain for 1 week with rhabdomyolysis. EXAM: MR OF THE LEFT FEMUR WITHOUT CONTRAST; MRI OF LOWER RIGHT EXTREMITY WITHOUT CONTRAST; MRI OF THE RIGHT FEMUR WITHOUT CONTRAST; MRI OF LOWER LEFT EXTREMITY WITHOUT CONTRAST TECHNIQUE: Multiplanar, multisequence MR imaging of the bilateral lower extremities was performed. No intravenous contrast was administered. COMPARISON:  None. FINDINGS: Bilateral nonspecific edema like signal abnormality in the thigh and calf musculature. This is most pronounced in the posterior compartment of both thighs. It is fairly symmetric bilaterally. The muscles are enlarged and demonstrate diffuse edema. There is also some fluid surrounding the muscles. Similar findings involving the upper calf musculature bilaterally although not as significant as the thighs. This is mainly involving the gastrocs muscles bilaterally. The soleus muscles are less in all. Anterior compartment muscles are also less involved. No significant bony findings. No findings for septic arthritis or osteomyelitis. IMPRESSION: 1. Nonspecific myositis involving the posterior compartments of the thighs along with the calf musculature bilaterally. This is a nonspecific finding and can be seen with trauma, overuse syndromes, drug reaction, inflammatory myopathys such as polymyositis, etc. 2. No findings for cellulitis, septic arthritis or osteomyelitis. Electronically Signed   By: Marijo Sanes M.D.   On: 12/18/2016 19:39   Mr Frmur Right Wo Contrast  Result Date: 12/18/2016 CLINICAL DATA:  Bilateral lower extremity pain for 1 week with rhabdomyolysis. EXAM: MR OF THE LEFT FEMUR WITHOUT CONTRAST; MRI OF LOWER RIGHT EXTREMITY WITHOUT CONTRAST; MRI OF THE RIGHT FEMUR WITHOUT CONTRAST; MRI OF LOWER LEFT EXTREMITY WITHOUT  CONTRAST TECHNIQUE: Multiplanar, multisequence MR imaging of the bilateral lower extremities was performed. No intravenous contrast was administered. COMPARISON:  None. FINDINGS: Bilateral nonspecific edema like signal abnormality in the thigh and calf musculature. This is most pronounced in the posterior compartment of both thighs. It is fairly symmetric bilaterally. The muscles are enlarged and demonstrate diffuse edema. There is also some fluid surrounding the muscles. Similar findings involving the upper calf musculature bilaterally although not as significant as the thighs. This is mainly involving the gastrocs muscles bilaterally. The soleus muscles are less in all. Anterior compartment muscles are also less involved. No significant bony findings. No findings for septic arthritis or  osteomyelitis. IMPRESSION: 1. Nonspecific myositis involving the posterior compartments of the thighs along with the calf musculature bilaterally. This is a nonspecific finding and can be seen with trauma, overuse syndromes, drug reaction, inflammatory myopathys such as polymyositis, etc. 2. No findings for cellulitis, septic arthritis or osteomyelitis. Electronically Signed   By: Marijo Sanes M.D.   On: 12/18/2016 19:39   Mr Tibia Fibula Left Wo Contrast  Result Date: 12/18/2016 CLINICAL DATA:  Bilateral lower extremity pain for 1 week with rhabdomyolysis. EXAM: MR OF THE LEFT FEMUR WITHOUT CONTRAST; MRI OF LOWER RIGHT EXTREMITY WITHOUT CONTRAST; MRI OF THE RIGHT FEMUR WITHOUT CONTRAST; MRI OF LOWER LEFT EXTREMITY WITHOUT CONTRAST TECHNIQUE: Multiplanar, multisequence MR imaging of the bilateral lower extremities was performed. No intravenous contrast was administered. COMPARISON:  None. FINDINGS: Bilateral nonspecific edema like signal abnormality in the thigh and calf musculature. This is most pronounced in the posterior compartment of both thighs. It is fairly symmetric bilaterally. The muscles are enlarged and  demonstrate diffuse edema. There is also some fluid surrounding the muscles. Similar findings involving the upper calf musculature bilaterally although not as significant as the thighs. This is mainly involving the gastrocs muscles bilaterally. The soleus muscles are less in all. Anterior compartment muscles are also less involved. No significant bony findings. No findings for septic arthritis or osteomyelitis. IMPRESSION: 1. Nonspecific myositis involving the posterior compartments of the thighs along with the calf musculature bilaterally. This is a nonspecific finding and can be seen with trauma, overuse syndromes, drug reaction, inflammatory myopathys such as polymyositis, etc. 2. No findings for cellulitis, septic arthritis or osteomyelitis. Electronically Signed   By: Marijo Sanes M.D.   On: 12/18/2016 19:39   Mr Femur Left Wo Contrast  Result Date: 12/18/2016 CLINICAL DATA:  Bilateral lower extremity pain for 1 week with rhabdomyolysis. EXAM: MR OF THE LEFT FEMUR WITHOUT CONTRAST; MRI OF LOWER RIGHT EXTREMITY WITHOUT CONTRAST; MRI OF THE RIGHT FEMUR WITHOUT CONTRAST; MRI OF LOWER LEFT EXTREMITY WITHOUT CONTRAST TECHNIQUE: Multiplanar, multisequence MR imaging of the bilateral lower extremities was performed. No intravenous contrast was administered. COMPARISON:  None. FINDINGS: Bilateral nonspecific edema like signal abnormality in the thigh and calf musculature. This is most pronounced in the posterior compartment of both thighs. It is fairly symmetric bilaterally. The muscles are enlarged and demonstrate diffuse edema. There is also some fluid surrounding the muscles. Similar findings involving the upper calf musculature bilaterally although not as significant as the thighs. This is mainly involving the gastrocs muscles bilaterally. The soleus muscles are less in all. Anterior compartment muscles are also less involved. No significant bony findings. No findings for septic arthritis or osteomyelitis.  IMPRESSION: 1. Nonspecific myositis involving the posterior compartments of the thighs along with the calf musculature bilaterally. This is a nonspecific finding and can be seen with trauma, overuse syndromes, drug reaction, inflammatory myopathys such as polymyositis, etc. 2. No findings for cellulitis, septic arthritis or osteomyelitis. Electronically Signed   By: Marijo Sanes M.D.   On: 12/18/2016 19:39     Scheduled Meds: . dextromethorphan-guaiFENesin  1 tablet Oral BID  . polyethylene glycol  17 g Oral Daily  . potassium chloride  40 mEq Oral BID   Continuous Infusions: . sodium chloride 200 mL/hr at 12/19/16 1345     LOS: 6 days   Dariona Postma, MD, FACP, FHM. [facility] Pager [phone]  If 7PM-7AM, please contact night-coverage [email or web address] Password [facility] 12/19/2016, 3:28 PM

## 2016-12-20 LAB — COMPREHENSIVE METABOLIC PANEL
ALBUMIN: 2.7 g/dL — AB (ref 3.5–5.0)
ALK PHOS: 45 U/L (ref 38–126)
ALT: 142 U/L — ABNORMAL HIGH (ref 14–54)
ANION GAP: 5 (ref 5–15)
AST: 158 U/L — AB (ref 15–41)
BILIRUBIN TOTAL: 0.7 mg/dL (ref 0.3–1.2)
BUN: 8 mg/dL (ref 6–20)
CALCIUM: 8.7 mg/dL — AB (ref 8.9–10.3)
CO2: 27 mmol/L (ref 22–32)
Chloride: 108 mmol/L (ref 101–111)
Creatinine, Ser: 0.66 mg/dL (ref 0.44–1.00)
GFR calc Af Amer: >60 mL/min (ref >60)
GFR calc non Af Amer: >60 mL/min (ref >60)
GLUCOSE: 116 mg/dL — AB (ref 65–99)
Potassium: 4.4 mmol/L (ref 3.5–5.1)
SODIUM: 140 mmol/L (ref 135–145)
TOTAL PROTEIN: 5.1 g/dL — AB (ref 6.5–8.1)

## 2016-12-20 LAB — CBC
HCT: 33.5 % — ABNORMAL LOW (ref 36.0–46.0)
HEMOGLOBIN: 11.9 g/dL — AB (ref 12.0–15.0)
MCH: 31 pg (ref 26.0–34.0)
MCHC: 35.5 g/dL (ref 30.0–36.0)
MCV: 87.2 fL (ref 78.0–100.0)
Platelets: 160 10*3/uL (ref 150–400)
RBC: 3.84 MIL/uL — ABNORMAL LOW (ref 3.87–5.11)
RDW: 12.1 % (ref 11.5–15.5)
WBC: 4.7 10*3/uL (ref 4.0–10.5)

## 2016-12-20 LAB — CK: Total CK: 31548 U/L — ABNORMAL HIGH (ref 38–234)

## 2016-12-20 NOTE — Progress Notes (Signed)
PROGRESS NOTE    Mallory POSAS  Morales:[id number] DOB: 1956/12/20 DOA: 12/13/2016 PCP: Simona Huh, MD   Chief Complaint  Patient presents with  . Generalized Body Aches     Brief Narrative:  60 y.o. female with medical history significant of HTN, HLD, T3N0 colon cancer s/p removal, DVT, and PE; who presents with complaints of bilateral lower extremity crampy pain. Admitted for acute rhabdo, elevated LFTs. On IVF. GI consulted.   Additional history: Patient states that she was in her usual state of health until 4-5 days before Christmas when she developed "scratchy throat", high fever and generalized body aches. She took some OTC medications including TheraFlu and her symptoms resolved. Couple of days later, she noted worsening pain in her lower extremities, especially in her coughs when extent where she was unable to walk which brought her to the hospital. She may have taken doubled the dose of her statins for a couple days prior to admission.  Assessment & Plan   Acute Rhabdomyolysis - Since admission, her statins were held, she was aggressively hydrated with IV fluids and has had good urine output with normal creatinine. Despite these measures, [name] remains >50 K. Her rhabdomyolysis was initially felt to be related to statins and she may have taken more than prescribed doses for a couple of days prior to admission. GI has seen and suspect that her mildly elevated LFTs are secondary to rhabdomyolysis and have signed off on 12/28. Lower extremity venous Dopplers negative for DVT. Aldolase and CRP negative. ANA negative. ESR minimally elevated at 25. Uric acid normal. - On 12/28, discussed her care extensively with the rheumatologist/Dr. Amil Amen who advised that her clinical picture is still suspicious for rhabdomyolysis which may be post acute viral illness recently compounded by statins. Given how high the CK is, he feels that polymyositis and dermatomyositis seem less likely. Also  her symptoms are more distal than proximal which argues against polymyositis. We agreed on obtaining MRI of her lower extremities for possible myositis. He did not recommend obtaining a biopsy or steroids at this time. He indicated that if patient's pain was better, otherwise stable, she could be discharged and would kindly see her in outpatient consultation. - MRI of lower extremities shows nonspecific myositis of the posterior compartments of the thighs along with cough muscles bilaterally. No findings for cellulitis, septic arthritis or osteomyelitis. - For the first time finally after several days CK started decreasing from 12/29. Continue IV fluids and follow CK. Had significant lower extremity pains last night requiring pain medications. CK down to 31K  Hypokalemia -Resolved - Magnesium level 2  Essential hypertension -Hold HCTZ   Hyperlipidemia - DC simvastatin  Elevated LFTS -likely secondary to the above -Continue IVF -RUQ US unremarkable  -Monitor CMP -hepatitis panel negative -Gastroenterology consulted and appreciated input. GI signed off.  History of colon cancer -Review of records shows that her CEA levels have been on upward trend with the last CEA level noted as 5 (was 8 prior to that).  -CT abd/pelvis 09/13/2016 showed no recurrence   -Patient is followed by Dr. Burney Gauze. Last flexible sigmoidoscopy was in 2015 and she is due for a repeat in 2018.  History of DVT/PE -Thought to be related to patient's colon cancer, but is no longer on anticoagulation. Lower extremity venous Dopplers negative for DVT.  Constipation -Continue miralax  Thromobocytopenia - Chronic and intermittent. Stable. Heparin discontinued and on SCDs for DVT prophylaxis. Platelets normal 12/30.  DVT Prophylaxis  SCDs  Code Status: Full  Family Communication: None at bedside  Disposition Plan: Admitted. Home when stable-possibly 12/29  Consultants Gastroenterology, Dr.  Collene Mares  Procedures  RUQ [place]  Antibiotics   Anti-infectives    None      Subjective:   Mallory Morales seen and examined today.  Had significant lower extremity pains last night especially with ambulation, requiring oral pain medications. Better today.  Objective:   Vitals:   12/19/16 0541 12/19/16 1443 12/19/16 2125 12/20/16 0523  BP: 136/74 134/88 134/83 124/77  Pulse: 71 75 72 68  Resp: _0 Temp: 98.4 F (36.9 C) 98.4 F (36.9 C) 98.1 F (36.7 C) 98.8 F (37.1 C)  TempSrc: Oral Oral Oral Oral  SpO2: 100% 100% 100% 100%  Weight:      Height:        Intake/Output Summary (Last 24 hours) at 12/20/16 1400 Last data filed at 12/20/16 0430  Gross per 24 hour  Intake             3660 ml  Output             1150 ml  Net             2510 ml   Filed Weights   12/13/16 1724 12/14/16 0538  Weight: 80.7 kg (178 lb) 85.5 kg (188 lb 7.9 oz)    Exam  General: Well developed, well nourished, NAD. Sitting up comfortably in bed.  HEENT: NCAT, mucous membranes moist.   Cardiovascular: S1 S2 auscultated, RRR, no murmurs  Respiratory: Clear to auscultation bilaterally with equal chest rise  Abdomen: Soft, nontender, nondistended, + bowel sounds  Extremities: warm dry without cyanosis clubbing or edema. Grade 5 x 5 power in all extremities. Mild calf tenderness bilaterally.  Neuro: AAOx3, nonfocal  Psych: Normal affect and demeanor with intact judgement and insight, pleasant  Data Reviewed: I have personally reviewed following labs and imaging studies  CBC:  Recent Labs Lab 12/13/16 1930 12/14/16 0524 12/15/16 0407 12/16/16 0536 12/17/16 0407 12/20/16 0648  WBC 4.1 2.8* 3.1* 3.7* 4.4 4.7  NEUTROABS 2.7 1.2*  --   --   --   --   HGB 15.0 13.1 12.2 11.9* 12.1 11.9*  HCT 41.4 36.9 35.5* 34.7* 35.5* 33.5*  MCV 87.0 85.8 89.6 88.5 87.4 87.2  PLT 128* 114* 95* 108* 130* 333   Basic Metabolic Panel:  Recent Labs Lab 12/15/16 0407 12/16/16 0536  12/17/16 0407 12/18/16 0356 12/20/16 0648  NA 143 142 141 139 140  K 2.9* 4.3 4.8 4.3 4.4  CL 97* 108 108 107 108  CO2 40* _1 GLUCOSE 137* 123* 126* 112* 116*  BUN _2 CREATININE 0.82 0.63 0.67 0.64 0.66  CALCIUM 8.4* 8.6* 8.7* 8.7* 8.7*  MG 2.0  --   --   --   --   PHOS  --   --   --  4.0  --    GFR: Estimated Creatinine Clearance: 82.8 mL/min (by C-G formula based on SCr of 0.66 mg/dL). Liver Function Tests:  Recent Labs Lab 12/15/16 0407 12/16/16 0536 12/17/16 0407 12/18/16 0356 12/20/16 0648  AST 308* 382* 406* 346* 158*  ALT 110* 139* 169* 185* 142*  ALKPHOS 41 40 44 50 45  BILITOT 0.7 0.6 0.8 0.4 0.7  PROT 5.4* 5.3* 5.5* 5.7* 5.1*  ALBUMIN 3.1* 3.1* 3.0* 3.0*  3.0* 2.7*   No results for input(s): LIPASE, AMYLASE in  the last 168 hours. No results for input(s): AMMONIA in the last 168 hours. Coagulation Profile: No results for input(s): INR, PROTIME in the last 168 hours. Cardiac Enzymes:  Recent Labs Lab 12/16/16 0536 12/17/16 0407 12/18/16 0356 12/19/16 0418 12/20/16 0648  CKTOTAL >50,000* >50,000* >50,000* 89,169* 31,548*   BNP (last 3 results) No results for input(s): PROBNP in the last 8760 hours. HbA1C: No results for input(s): HGBA1C in the last 72 hours. CBG: No results for input(s): GLUCAP in the last 168 hours. Lipid Profile: No results for input(s): CHOL, HDL, LDLCALC, TRIG, CHOLHDL, LDLDIRECT in the last 72 hours. Thyroid Function Tests: No results for input(s): TSH, T4TOTAL, FREET4, T3FREE, THYROIDAB in the last 72 hours. Anemia Panel: No results for input(s): VITAMINB12, FOLATE, FERRITIN, TIBC, IRON, RETICCTPCT in the last 72 hours. Urine analysis:    Component Value Date/Time   COLORURINE YELLOW 12/13/2016 2233   APPEARANCEUR CLEAR 12/13/2016 2233   LABSPEC 1.004 (L) 12/13/2016 2233   PHURINE 7.0 12/13/2016 2233   GLUCOSEU NEGATIVE 12/13/2016 2233   HGBUR LARGE (A) 12/13/2016 2233   BILIRUBINUR NEGATIVE  12/13/2016 2233   KETONESUR NEGATIVE 12/13/2016 2233   PROTEINUR NEGATIVE 12/13/2016 2233   UROBILINOGEN 1.0 01/30/2012 1450   NITRITE NEGATIVE 12/13/2016 2233   LEUKOCYTESUR TRACE (A) 12/13/2016 2233      Radiology Studies: Mr Tibia Fibula Right Wo Contrast  Result Date: 12/18/2016 CLINICAL DATA:  Bilateral lower extremity pain for 1 week with rhabdomyolysis. EXAM: MR OF THE LEFT FEMUR WITHOUT CONTRAST; MRI OF LOWER RIGHT EXTREMITY WITHOUT CONTRAST; MRI OF THE RIGHT FEMUR WITHOUT CONTRAST; MRI OF LOWER LEFT EXTREMITY WITHOUT CONTRAST TECHNIQUE: Multiplanar, multisequence MR imaging of the bilateral lower extremities was performed. No intravenous contrast was administered. COMPARISON:  None. FINDINGS: Bilateral nonspecific edema like signal abnormality in the thigh and calf musculature. This is most pronounced in the posterior compartment of both thighs. It is fairly symmetric bilaterally. The muscles are enlarged and demonstrate diffuse edema. There is also some fluid surrounding the muscles. Similar findings involving the upper calf musculature bilaterally although not as significant as the thighs. This is mainly involving the gastrocs muscles bilaterally. The soleus muscles are less in all. Anterior compartment muscles are also less involved. No significant bony findings. No findings for septic arthritis or osteomyelitis. IMPRESSION: 1. Nonspecific myositis involving the posterior compartments of the thighs along with the calf musculature bilaterally. This is a nonspecific finding and can be seen with trauma, overuse syndromes, drug reaction, inflammatory myopathys such as polymyositis, etc. 2. No findings for cellulitis, septic arthritis or osteomyelitis. Electronically Signed   By: Marijo Sanes M.D.   On: 12/18/2016 19:39   Mr Frmur Right Wo Contrast  Result Date: 12/18/2016 CLINICAL DATA:  Bilateral lower extremity pain for 1 week with rhabdomyolysis. EXAM: MR OF THE LEFT FEMUR WITHOUT  CONTRAST; MRI OF LOWER RIGHT EXTREMITY WITHOUT CONTRAST; MRI OF THE RIGHT FEMUR WITHOUT CONTRAST; MRI OF LOWER LEFT EXTREMITY WITHOUT CONTRAST TECHNIQUE: Multiplanar, multisequence MR imaging of the bilateral lower extremities was performed. No intravenous contrast was administered. COMPARISON:  None. FINDINGS: Bilateral nonspecific edema like signal abnormality in the thigh and calf musculature. This is most pronounced in the posterior compartment of both thighs. It is fairly symmetric bilaterally. The muscles are enlarged and demonstrate diffuse edema. There is also some fluid surrounding the muscles. Similar findings involving the upper calf musculature bilaterally although not as significant as the thighs. This is mainly involving the gastrocs muscles bilaterally. The soleus muscles are  less in all. Anterior compartment muscles are also less involved. No significant bony findings. No findings for septic arthritis or osteomyelitis. IMPRESSION: 1. Nonspecific myositis involving the posterior compartments of the thighs along with the calf musculature bilaterally. This is a nonspecific finding and can be seen with trauma, overuse syndromes, drug reaction, inflammatory myopathys such as polymyositis, etc. 2. No findings for cellulitis, septic arthritis or osteomyelitis. Electronically Signed   By: Marijo Sanes M.D.   On: 12/18/2016 19:39   Mr Tibia Fibula Left Wo Contrast  Result Date: 12/18/2016 CLINICAL DATA:  Bilateral lower extremity pain for 1 week with rhabdomyolysis. EXAM: MR OF THE LEFT FEMUR WITHOUT CONTRAST; MRI OF LOWER RIGHT EXTREMITY WITHOUT CONTRAST; MRI OF THE RIGHT FEMUR WITHOUT CONTRAST; MRI OF LOWER LEFT EXTREMITY WITHOUT CONTRAST TECHNIQUE: Multiplanar, multisequence MR imaging of the bilateral lower extremities was performed. No intravenous contrast was administered. COMPARISON:  None. FINDINGS: Bilateral nonspecific edema like signal abnormality in the thigh and calf musculature. This is  most pronounced in the posterior compartment of both thighs. It is fairly symmetric bilaterally. The muscles are enlarged and demonstrate diffuse edema. There is also some fluid surrounding the muscles. Similar findings involving the upper calf musculature bilaterally although not as significant as the thighs. This is mainly involving the gastrocs muscles bilaterally. The soleus muscles are less in all. Anterior compartment muscles are also less involved. No significant bony findings. No findings for septic arthritis or osteomyelitis. IMPRESSION: 1. Nonspecific myositis involving the posterior compartments of the thighs along with the calf musculature bilaterally. This is a nonspecific finding and can be seen with trauma, overuse syndromes, drug reaction, inflammatory myopathys such as polymyositis, etc. 2. No findings for cellulitis, septic arthritis or osteomyelitis. Electronically Signed   By: Marijo Sanes M.D.   On: 12/18/2016 19:39   Mr Femur Left Wo Contrast  Result Date: 12/18/2016 CLINICAL DATA:  Bilateral lower extremity pain for 1 week with rhabdomyolysis. EXAM: MR OF THE LEFT FEMUR WITHOUT CONTRAST; MRI OF LOWER RIGHT EXTREMITY WITHOUT CONTRAST; MRI OF THE RIGHT FEMUR WITHOUT CONTRAST; MRI OF LOWER LEFT EXTREMITY WITHOUT CONTRAST TECHNIQUE: Multiplanar, multisequence MR imaging of the bilateral lower extremities was performed. No intravenous contrast was administered. COMPARISON:  None. FINDINGS: Bilateral nonspecific edema like signal abnormality in the thigh and calf musculature. This is most pronounced in the posterior compartment of both thighs. It is fairly symmetric bilaterally. The muscles are enlarged and demonstrate diffuse edema. There is also some fluid surrounding the muscles. Similar findings involving the upper calf musculature bilaterally although not as significant as the thighs. This is mainly involving the gastrocs muscles bilaterally. The soleus muscles are less in all. Anterior  compartment muscles are also less involved. No significant bony findings. No findings for septic arthritis or osteomyelitis. IMPRESSION: 1. Nonspecific myositis involving the posterior compartments of the thighs along with the calf musculature bilaterally. This is a nonspecific finding and can be seen with trauma, overuse syndromes, drug reaction, inflammatory myopathys such as polymyositis, etc. 2. No findings for cellulitis, septic arthritis or osteomyelitis. Electronically Signed   By: Marijo Sanes M.D.   On: 12/18/2016 19:39     Scheduled Meds: . dextromethorphan-guaiFENesin  1 tablet Oral BID  . polyethylene glycol  17 g Oral Daily  . potassium chloride  40 mEq Oral BID   Continuous Infusions: . sodium chloride 200 mL/hr at 12/20/16 0932     LOS: 7 days   Jaxx Huish, MD, FACP, FHM. [facility] Pager [phone]  If 7PM-7AM,  please contact night-coverage [email or web address] Password [email or web address] 12/20/2016, 2:00 PM

## 2016-12-21 LAB — CK: Total CK: 24128 U/L — ABNORMAL HIGH (ref 38–234)

## 2016-12-21 NOTE — Progress Notes (Signed)
PROGRESS NOTE    Mallory Morales  LEX:[id number] DOB: September 15, 1956 DOA: 12/13/2016 PCP: Simona Huh, MD   Chief Complaint  Patient presents with  . Generalized Body Aches     Brief Narrative:  60 y.o. female with medical history significant of HTN, HLD, T3N0 colon cancer s/p removal, DVT, and PE; who presents with complaints of bilateral lower extremity crampy pain. Admitted for acute rhabdo, elevated LFTs. On IVF. GI consulted.   Additional history: Patient states that she was in her usual state of health until 4-5 days before Christmas when she developed "scratchy throat", high fever and generalized body aches. She took some OTC medications including TheraFlu and her symptoms resolved. Couple of days later, she noted worsening pain in her lower extremities, especially in her coughs when extent where she was unable to walk which brought her to the hospital. She may have taken doubled the dose of her statins for a couple days prior to admission.  Assessment & Plan   Acute Rhabdomyolysis - Since admission, her statins were held, she was aggressively hydrated with IV fluids and has had good urine output with normal creatinine. Despite these measures, [name] remains >50 K. Her rhabdomyolysis was initially felt to be related to statins and she may have taken more than prescribed doses for a couple of days prior to admission. GI has seen and suspect that her mildly elevated LFTs are secondary to rhabdomyolysis and have signed off on 12/28. Lower extremity venous Dopplers negative for DVT. Aldolase and CRP negative. ANA negative. ESR minimally elevated at 25. Uric acid normal. - On 12/28, discussed her care extensively with the rheumatologist/Dr. Amil Amen who advised that her clinical picture is still suspicious for rhabdomyolysis which may be post acute viral illness recently compounded by statins. Given how high the CK is, he feels that polymyositis and dermatomyositis seem less likely. Also  her symptoms are more distal than proximal which argues against polymyositis. We agreed on obtaining MRI of her lower extremities for possible myositis. He did not recommend obtaining a biopsy or steroids at this time. He indicated that if patient's pain was better, otherwise stable, she could be discharged and would kindly see her in outpatient consultation. - MRI of lower extremities shows nonspecific myositis of the posterior compartments of the thighs along with cough muscles bilaterally. No findings for cellulitis, septic arthritis or osteomyelitis. - For the first time finally after several days CK started decreasing from 12/29. Continue IV fluids and follow CK. Still has lower extremity pains. CK down to 24K  Hypokalemia -Resolved - Magnesium level 2  Essential hypertension -Hold HCTZ   Hyperlipidemia - DC simvastatin  Elevated LFTS -likely secondary to the above -Continue IVF -RUQ US unremarkable  -Monitor CMP -hepatitis panel negative -Gastroenterology consulted and appreciated input. GI signed off.  History of colon cancer -Review of records shows that her CEA levels have been on upward trend with the last CEA level noted as 5 (was 8 prior to that).  -CT abd/pelvis 09/13/2016 showed no recurrence   -Patient is followed by Dr. Burney Gauze. Last flexible sigmoidoscopy was in 2015 and she is due for a repeat in 2018.  History of DVT/PE -Thought to be related to patient's colon cancer, but is no longer on anticoagulation. Lower extremity venous Dopplers negative for DVT.  Constipation -Continue miralax  Thromobocytopenia - Chronic and intermittent. Stable. Heparin discontinued and on SCDs for DVT prophylaxis. Platelets normal 12/30.  DVT Prophylaxis  SCDs  Code Status: Full  Family Communication: None at bedside  Disposition Plan: Admitted. Home when stable-possibly 12/29  Consultants Gastroenterology, Dr. Collene Mares  Procedures  RUQ [place]  Antibiotics     Anti-infectives    None      Subjective:   Mallory Morales seen and examined today. No lower extremity pains after medications but states that still has significant lower extremity pain before taking medications.  Objective:   Vitals:   12/20/16 1440 12/20/16 2210 12/21/16 0622 12/21/16 1452  BP: 129/82 133/76 124/83 133/84  Pulse: 78 81 73 76  Resp: '18 18 18 18  '$ Temp: 98.3 F (36.8 C) 98.8 F (37.1 C) 98.6 F (37 C) 98.5 F (36.9 C)  TempSrc: Oral Oral  Oral  SpO2: 99% 98% 98% 100%  Weight:      Height:        Intake/Output Summary (Last 24 hours) at 12/21/16 1514 Last data filed at 12/21/16 1400  Gross per 24 hour  Intake             5640 ml  Output             2000 ml  Net             3640 ml   Filed Weights   12/13/16 1724 12/14/16 0538  Weight: 80.7 kg (178 lb) 85.5 kg (188 lb 7.9 oz)    Exam  General: Well developed, well nourished, NAD. Sitting up comfortably in bed.  HEENT: NCAT, mucous membranes moist.   Cardiovascular: S1 S2 auscultated, RRR, no murmurs  Respiratory: Clear to auscultation bilaterally with equal chest rise  Abdomen: Soft, nontender, nondistended, + bowel sounds  Extremities: warm dry without cyanosis clubbing or edema. Grade 5 x 5 power in all extremities. Mild calf tenderness bilaterally.  Neuro: AAOx3, nonfocal  Psych: Normal affect and demeanor with intact judgement and insight, pleasant  Data Reviewed: I have personally reviewed following labs and imaging studies  CBC:  Recent Labs Lab 12/15/16 0407 12/16/16 0536 12/17/16 0407 12/20/16 0648  WBC 3.1* 3.7* 4.4 4.7  HGB 12.2 11.9* 12.1 11.9*  HCT 35.5* 34.7* 35.5* 33.5*  MCV 89.6 88.5 87.4 87.2  PLT 95* 108* 130* 465   Basic Metabolic Panel:  Recent Labs Lab 12/15/16 0407 12/16/16 0536 12/17/16 0407 12/18/16 0356 12/20/16 0648  NA 143 142 141 139 140  K 2.9* 4.3 4.8 4.3 4.4  CL 97* 108 108 107 108  CO2 40* '24 28 25 27  '$ GLUCOSE 137* 123* 126* 112* 116*   BUN '7 6 7 8 8  '$ CREATININE 0.82 0.63 0.67 0.64 0.66  CALCIUM 8.4* 8.6* 8.7* 8.7* 8.7*  MG 2.0  --   --   --   --   PHOS  --   --   --  4.0  --    GFR: Estimated Creatinine Clearance: 82.8 mL/min (by C-G formula based on SCr of 0.66 mg/dL). Liver Function Tests:  Recent Labs Lab 12/15/16 0407 12/16/16 0536 12/17/16 0407 12/18/16 0356 12/20/16 0648  AST 308* 382* 406* 346* 158*  ALT 110* 139* 169* 185* 142*  ALKPHOS 41 40 44 50 45  BILITOT 0.7 0.6 0.8 0.4 0.7  PROT 5.4* 5.3* 5.5* 5.7* 5.1*  ALBUMIN 3.1* 3.1* 3.0* 3.0*  3.0* 2.7*   No results for input(s): LIPASE, AMYLASE in the last 168 hours. No results for input(s): AMMONIA in the last 168 hours. Coagulation Profile: No results for input(s): INR, PROTIME in the last 168 hours. Cardiac Enzymes:  Recent Labs  Lab 12/17/16 0407 12/18/16 0356 12/19/16 0418 12/20/16 0648 12/21/16 0552  CKTOTAL >50,000* >50,000* 16,109* 31,548* 24,128*   BNP (last 3 results) No results for input(s): PROBNP in the last 8760 hours. HbA1C: No results for input(s): HGBA1C in the last 72 hours. CBG: No results for input(s): GLUCAP in the last 168 hours. Lipid Profile: No results for input(s): CHOL, HDL, LDLCALC, TRIG, CHOLHDL, LDLDIRECT in the last 72 hours. Thyroid Function Tests: No results for input(s): TSH, T4TOTAL, FREET4, T3FREE, THYROIDAB in the last 72 hours. Anemia Panel: No results for input(s): VITAMINB12, FOLATE, FERRITIN, TIBC, IRON, RETICCTPCT in the last 72 hours. Urine analysis:    Component Value Date/Time   COLORURINE YELLOW 12/13/2016 2233   APPEARANCEUR CLEAR 12/13/2016 2233   LABSPEC 1.004 (L) 12/13/2016 2233   PHURINE 7.0 12/13/2016 2233   GLUCOSEU NEGATIVE 12/13/2016 2233   HGBUR LARGE (A) 12/13/2016 2233   BILIRUBINUR NEGATIVE 12/13/2016 2233   KETONESUR NEGATIVE 12/13/2016 2233   PROTEINUR NEGATIVE 12/13/2016 2233   UROBILINOGEN 1.0 01/30/2012 1450   NITRITE NEGATIVE 12/13/2016 2233   LEUKOCYTESUR TRACE  (A) 12/13/2016 2233      Radiology Studies: No results found.   Scheduled Meds: . dextromethorphan-guaiFENesin  1 tablet Oral BID  . polyethylene glycol  17 g Oral Daily  . potassium chloride  40 mEq Oral BID   Continuous Infusions: . sodium chloride 200 mL/hr at 12/21/16 1449     LOS: 8 days   HONGALGI,ANAND, MD, FACP, FHM. [facility] Pager [phone]  If 7PM-7AM, please contact night-coverage [email or web address] Password [email or web address] 12/21/2016, 3:14 PM

## 2016-12-22 ENCOUNTER — Inpatient Hospital Stay (HOSPITAL_COMMUNITY): Payer: BLUE CROSS/BLUE SHIELD

## 2016-12-22 DIAGNOSIS — M79609 Pain in unspecified limb: Secondary | ICD-10-CM

## 2016-12-22 DIAGNOSIS — M7989 Other specified soft tissue disorders: Secondary | ICD-10-CM

## 2016-12-22 DIAGNOSIS — I82621 Acute embolism and thrombosis of deep veins of right upper extremity: Secondary | ICD-10-CM

## 2016-12-22 LAB — CK: Total CK: 20995 U/L — ABNORMAL HIGH (ref 38–234)

## 2016-12-22 MED ORDER — ENOXAPARIN SODIUM 100 MG/ML ~~LOC~~ SOLN
1.0000 mg/kg | Freq: Two times a day (BID) | SUBCUTANEOUS | Status: DC
  Administered 2016-12-22 – 2016-12-23 (×2): 85 mg via SUBCUTANEOUS
  Filled 2016-12-22 (×2): qty 1

## 2016-12-22 NOTE — Progress Notes (Signed)
ANTICOAGULATION CONSULT NOTE - Initial Consult  Pharmacy Consult for Lovenox Indication: DVT  Allergies  Allergen Reactions  . Simvastatin     Severe rhabdomyolysis.  . Strawberry Extract Swelling    Face only. Breathing not affected.    Patient Measurements: Height: 5\' 5"  (165.1 cm) Weight: 188 lb 7.9 oz (85.5 kg) IBW/kg (Calculated) : 57  Vital Signs: Temp: 98.5 F (36.9 C) (01/01 0616) BP: 126/84 (01/01 0616) Pulse Rate: 74 (01/01 0616)  Labs:  Recent Labs  12/20/16 0648 12/21/16 0552 12/22/16 0354  HGB 11.9*  --   --   HCT 33.5*  --   --   PLT 160  --   --   CREATININE 0.66  --   --   CKTOTAL 31,548* 24,128* 20,995*    Estimated Creatinine Clearance: 82.8 mL/min (by C-G formula based on SCr of 0.66 mg/dL).   Medical History: Past Medical History:  Diagnosis Date  . Anemia, iron deficiency 01/27/2012  . Clotting disorder ([place])   . Colon cancer ([place])    T3N0M0, emergency surgery/with colostomy done-no further tx.  . Complication of anesthesia    "hard time getting breath" post op  . DVT (deep venous thrombosis) ([place])   . Hyperlipemia   . Hypertension   . Pulmonary embolism ([facility])    taking injections -last done 1'14([facility])   Assessment: 59 yoF with new diagnosis of acute DVT noted in the right brachial vein. To start lovenox.   Goal of Therapy:  Anti-Xa level 0.6-1 units/ml 4hrs after LMWH dose given Monitor platelets by anticoagulation protocol: Yes   Plan:  1. Lovenox 85 mg (1 mg/kg) subcutaneously every 12 hours  2. CBC every 72 hours  Vincenza Hews, PharmD, BCPS 12/22/2016, 4:52 PM Pager: [phone]

## 2016-12-22 NOTE — Progress Notes (Signed)
PROGRESS NOTE    Mallory Morales  RDE:[id number] DOB: February 07, 1956 DOA: 12/13/2016 PCP: Simona Huh, MD   Chief Complaint  Patient presents with  . Generalized Body Aches     Brief Narrative:  61 y.o. female with medical history significant of HTN, HLD, T3N0 colon cancer s/p removal, DVT, and PE who completed 1 year of Arixtra; presented with complaints of bilateral lower extremity crampy pain. Admitted for acute rhabdo, elevated LFTs. Treated with IV fluids. Initially there was no improvement in [name] which remained >50 K for several days. Finally after aggressive IV fluid hydration, CK has been gradually improving. It is possible that [name] was so high for the first several days and greater than 50 K that we were not seeing a change in the numbers. Discussed with rheumatology and she can follow-up with them as outpatient (details in follow up section of discharge). On 12/22/16, diagnosed with right upper extremity DVT, possibly provoked by peripheral IV line. Started on Lovenox bridging pending her primary hematologist evaluation on 1/2. Patient prefers to be on Arixtra but not sure if her insurance will cover this in the absence of a cancer diagnosis. This too can be checked with her insurance on 1/2.  Additional history: Patient states that she was in her usual state of health until 4-5 days before Christmas when she developed "scratchy throat", high fever and generalized body aches. She took some OTC medications including TheraFlu and her symptoms resolved. Couple of days later, she noted worsening pain in her lower extremities, especially in her coughs when extent where she was unable to walk which brought her to the hospital. She may have taken doubled the dose of her statins for a couple days prior to admission.  Assessment & Plan   Acute Rhabdomyolysis - Since admission, her statins were held, she was aggressively hydrated with IV fluids and has had good urine output with normal  creatinine. Despite these measures, [name] remains >50 K. Her rhabdomyolysis was initially felt to be related to statins and she may have taken more than prescribed doses for a couple of days prior to admission. GI has seen and suspect that her mildly elevated LFTs are secondary to rhabdomyolysis and have signed off on 12/28. Lower extremity venous Dopplers negative for DVT. Aldolase and CRP negative. ANA negative. ESR minimally elevated at 25. Uric acid normal. - On 12/28, discussed her care extensively with the rheumatologist/Dr. Amil Amen who advised that her clinical picture is still suspicious for rhabdomyolysis which may be post acute viral illness recently compounded by statins. Given how high the CK is, he feels that polymyositis and dermatomyositis seem less likely. Also her symptoms are more distal than proximal which argues against polymyositis. We agreed on obtaining MRI of her lower extremities for possible myositis. He did not recommend obtaining a biopsy or steroids at this time. He indicated that if patient's pain was better, otherwise stable, she could be discharged and would kindly see her in outpatient consultation. - MRI of lower extremities shows nonspecific myositis of the posterior compartments of the thighs along with cough muscles bilaterally. No findings for cellulitis, septic arthritis or osteomyelitis. - For the first time finally after several days CK started decreasing from 12/29. Continue IV fluids and follow CK. Still has lower extremity pains. CK down to 20K. From rhabdomyolysis standpoint, she could be discharged home. She has been advised to drink plenty of fluids and needs to follow up with her PCP in 5-7 days from discharge  with repeat CK, CMP and CBCs.  Hypokalemia -Resolved - Magnesium level 2  Essential hypertension -Hold HCTZ   Hyperlipidemia - DC simvastatin. She should not be on statins given serious adverse reaction during this admission. Added simvastatin to  allergies.  Elevated LFTS -likely secondary to the above -Continue IVF -RUQ US unremarkable  -Monitor CMP -hepatitis panel negative -Gastroenterology consulted and appreciated input. GI signed off.  History of colon cancer -Review of records shows that her CEA levels have been on upward trend with the last CEA level noted as 5 (was 8 prior to that).  -CT abd/pelvis 09/13/2016 showed no recurrence   -Patient is followed by Dr. Burney Gauze. Last flexible sigmoidoscopy was in 2015 and she is due for a repeat in 2018. - Requested on call oncologist to have patient's primary oncologist see her on 12/23/16 to advise regarding anticoagulation.  New right upper extremity DVT/History of DVT/PE -Thought to be related to patient's colon cancer, but is no longer on anticoagulation. Lower extremity venous Dopplers negative for DVT. - On 12/22/16 venous Doppler confirmed acute DVT noted in right brachial vein. Discussed in detail with patient regarding options of oral anticoagulation. She however wishes to be on Arixtra which she used for one year during previous episode of VTE. I'm not sure if this will be covered by her insurance in the absence of current cancer diagnosis. I discussed with on call oncologist today. I have requested that Dr.Ennever see her on 12/23/16 to advise. In the interim, she will be started on full dose Lovenox.  Constipation -Continue miralax  Thromobocytopenia - Chronic and intermittent. Stable. Heparin discontinued and on SCDs for DVT prophylaxis. Platelets normal 12/30. Follow CBCs.  DVT Prophylaxis  SCDs. Now on full dose Lovenox.  Code Status: Full  Family Communication: None at bedside  Disposition Plan: DC home possibly 12/23/16 pending oncology input.  Consultants Gastroenterology, Dr. Collene Mares  Procedures  RUQ [place]  Antibiotics   Anti-infectives    None      Subjective:   Mallory Morales seen and examined today. Lower extremity pains are starting to improve.  Rated as 5/10 in severity. New onset right upper extremity pain and swelling since last night. Has peripheral IV line in that cubital fossa.  Objective:   Vitals:   12/21/16 0622 12/21/16 1452 12/21/16 2135 12/22/16 0616  BP: 124/83 133/84 129/76 126/84  Pulse: 73 76 82 74  Resp: '18 18 18 18  '$ Temp: 98.6 F (37 C) 98.5 F (36.9 C) 98.8 F (37.1 C) 98.5 F (36.9 C)  TempSrc:  Oral Oral   SpO2: 98% 100% 99% 99%  Weight:      Height:        Intake/Output Summary (Last 24 hours) at 12/22/16 1635 Last data filed at 12/22/16 1437  Gross per 24 hour  Intake             1840 ml  Output             2400 ml  Net             -560 ml   Filed Weights   12/13/16 1724 12/14/16 0538  Weight: 80.7 kg (178 lb) 85.5 kg (188 lb 7.9 oz)    Exam  General: Well developed, well nourished, NAD. Sitting up comfortably in bed.  HEENT: NCAT, mucous membranes moist.   Cardiovascular: S1 S2 auscultated, RRR, no murmurs  Respiratory: Clear to auscultation bilaterally with equal chest rise  Abdomen: Soft, nontender, nondistended, + bowel  sounds  Extremities: warm dry without cyanosis clubbing or edema. Grade 5 x 5 power in all extremities. Mild calf tenderness bilaterally-Improving. Right upper extremity with mild to moderate diffuse swelling, no redness, increased warmth or other acute findings. Good peripheral pulses felt. No compartment syndrome.  Neuro: AAOx3, nonfocal  Psych: Normal affect and demeanor with intact judgement and insight, pleasant  Data Reviewed: I have personally reviewed following labs and imaging studies  CBC:  Recent Labs Lab 12/16/16 0536 12/17/16 0407 12/20/16 0648  WBC 3.7* 4.4 4.7  HGB 11.9* 12.1 11.9*  HCT 34.7* 35.5* 33.5*  MCV 88.5 87.4 87.2  PLT 108* 130* 812   Basic Metabolic Panel:  Recent Labs Lab 12/16/16 0536 12/17/16 0407 12/18/16 0356 12/20/16 0648  NA 142 141 139 140  K 4.3 4.8 4.3 4.4  CL 108 108 107 108  CO2 '24 28 25 27  '$ GLUCOSE  123* 126* 112* 116*  BUN '6 7 8 8  '$ CREATININE 0.63 0.67 0.64 0.66  CALCIUM 8.6* 8.7* 8.7* 8.7*  PHOS  --   --  4.0  --    GFR: Estimated Creatinine Clearance: 82.8 mL/min (by C-G formula based on SCr of 0.66 mg/dL). Liver Function Tests:  Recent Labs Lab 12/16/16 0536 12/17/16 0407 12/18/16 0356 12/20/16 0648  AST 382* 406* 346* 158*  ALT 139* 169* 185* 142*  ALKPHOS 40 44 50 45  BILITOT 0.6 0.8 0.4 0.7  PROT 5.3* 5.5* 5.7* 5.1*  ALBUMIN 3.1* 3.0* 3.0*  3.0* 2.7*   No results for input(s): LIPASE, AMYLASE in the last 168 hours. No results for input(s): AMMONIA in the last 168 hours. Coagulation Profile: No results for input(s): INR, PROTIME in the last 168 hours. Cardiac Enzymes:  Recent Labs Lab 12/18/16 0356 12/19/16 0418 12/20/16 0648 12/21/16 0552 12/22/16 0354  CKTOTAL >50,000* 75,170* 31,548* 24,128* 20,995*   BNP (last 3 results) No results for input(s): PROBNP in the last 8760 hours. HbA1C: No results for input(s): HGBA1C in the last 72 hours. CBG: No results for input(s): GLUCAP in the last 168 hours. Lipid Profile: No results for input(s): CHOL, HDL, LDLCALC, TRIG, CHOLHDL, LDLDIRECT in the last 72 hours. Thyroid Function Tests: No results for input(s): TSH, T4TOTAL, FREET4, T3FREE, THYROIDAB in the last 72 hours. Anemia Panel: No results for input(s): VITAMINB12, FOLATE, FERRITIN, TIBC, IRON, RETICCTPCT in the last 72 hours. Urine analysis:    Component Value Date/Time   COLORURINE YELLOW 12/13/2016 2233   APPEARANCEUR CLEAR 12/13/2016 2233   LABSPEC 1.004 (L) 12/13/2016 2233   PHURINE 7.0 12/13/2016 2233   GLUCOSEU NEGATIVE 12/13/2016 2233   HGBUR LARGE (A) 12/13/2016 2233   BILIRUBINUR NEGATIVE 12/13/2016 2233   KETONESUR NEGATIVE 12/13/2016 2233   PROTEINUR NEGATIVE 12/13/2016 2233   UROBILINOGEN 1.0 01/30/2012 1450   NITRITE NEGATIVE 12/13/2016 2233   LEUKOCYTESUR TRACE (A) 12/13/2016 2233      Radiology Studies: No results  found.   Scheduled Meds: . dextromethorphan-guaiFENesin  1 tablet Oral BID  . polyethylene glycol  17 g Oral Daily  . potassium chloride  40 mEq Oral BID   Continuous Infusions: . sodium chloride 200 mL/hr at 12/22/16 1437     LOS: 9 days   HONGALGI,ANAND, MD, FACP, FHM. [facility] Pager [phone]  If 7PM-7AM, please contact night-coverage [email or web address] Password [facility] 12/22/2016, 4:35 PM

## 2016-12-22 NOTE — Progress Notes (Signed)
VASCULAR LAB PRELIMINARY  PRELIMINARY  PRELIMINARY  PRELIMINARY  Right upper extremity venous duplex completed.    Preliminary report:  There is acute DVT noted in the right brachial vein. There is acute SVT noted in the right cephalic and basilic veins.   Called report to [facility], RN  Mauro Kaufmann, [place], RVT 12/22/2016, 11:51 AM

## 2016-12-23 DIAGNOSIS — M6282 Rhabdomyolysis: Principal | ICD-10-CM

## 2016-12-23 DIAGNOSIS — Z7901 Long term (current) use of anticoagulants: Secondary | ICD-10-CM

## 2016-12-23 LAB — COMPREHENSIVE METABOLIC PANEL
ALT: 115 U/L — ABNORMAL HIGH (ref 14–54)
ANION GAP: 9 (ref 5–15)
AST: 130 U/L — AB (ref 15–41)
Albumin: 2.9 g/dL — ABNORMAL LOW (ref 3.5–5.0)
Alkaline Phosphatase: 51 U/L (ref 38–126)
BILIRUBIN TOTAL: 0.7 mg/dL (ref 0.3–1.2)
BUN: 8 mg/dL (ref 6–20)
CO2: 21 mmol/L — ABNORMAL LOW (ref 22–32)
Calcium: 8.6 mg/dL — ABNORMAL LOW (ref 8.9–10.3)
Chloride: 108 mmol/L (ref 101–111)
Creatinine, Ser: 0.58 mg/dL (ref 0.44–1.00)
Glucose, Bld: 104 mg/dL — ABNORMAL HIGH (ref 65–99)
Potassium: 4.1 mmol/L (ref 3.5–5.1)
Sodium: 138 mmol/L (ref 135–145)
TOTAL PROTEIN: 5.5 g/dL — AB (ref 6.5–8.1)

## 2016-12-23 LAB — CBC
HEMATOCRIT: 33.6 % — AB (ref 36.0–46.0)
Hemoglobin: 11.8 g/dL — ABNORMAL LOW (ref 12.0–15.0)
MCH: 30.4 pg (ref 26.0–34.0)
MCHC: 35.1 g/dL (ref 30.0–36.0)
MCV: 86.6 fL (ref 78.0–100.0)
PLATELETS: 178 10*3/uL (ref 150–400)
RBC: 3.88 MIL/uL (ref 3.87–5.11)
RDW: 12.2 % (ref 11.5–15.5)
WBC: 4.1 10*3/uL (ref 4.0–10.5)

## 2016-12-23 LAB — URIC ACID: URIC ACID, SERUM: 3.7 mg/dL (ref 2.3–6.6)

## 2016-12-23 LAB — CK: Total CK: 18730 U/L — ABNORMAL HIGH (ref 38–234)

## 2016-12-23 LAB — PHOSPHORUS: PHOSPHORUS: 4.8 mg/dL — AB (ref 2.5–4.6)

## 2016-12-23 MED ORDER — RIVAROXABAN 20 MG PO TABS: 20.0000 mg | ORAL_TABLET | Freq: Every day | ORAL | Status: DC

## 2016-12-23 MED ORDER — RIVAROXABAN 15 MG PO TABS
15.0000 mg | ORAL_TABLET | Freq: Two times a day (BID) | ORAL | Status: DC
  Administered 2016-12-23 – 2016-12-24 (×3): 15 mg via ORAL
  Filled 2016-12-23 (×3): qty 1

## 2016-12-23 MED ORDER — HYDROCHLOROTHIAZIDE 25 MG PO TABS
12.5000 mg | ORAL_TABLET | Freq: Every day | ORAL | Status: DC
  Administered 2016-12-23 – 2016-12-24 (×2): 12.5 mg via ORAL
  Filled 2016-12-23 (×2): qty 1

## 2016-12-23 NOTE — Progress Notes (Signed)
ANTICOAGULATION CONSULT NOTE - Initial Consult  Pharmacy Consult for Xarelto Indication: DVT  Allergies  Allergen Reactions  . Simvastatin     Severe rhabdomyolysis.  . Strawberry Extract Swelling    Face only. Breathing not affected.    Patient Measurements: Height: 5\' 5"  (165.1 cm) Weight: 188 lb 7.9 oz (85.5 kg) IBW/kg (Calculated) : 57  Vital Signs: Temp: 98.6 F (37 C) (01/02 0449) Temp Source: Oral (01/02 0449) BP: 125/71 (01/02 0449) Pulse Rate: 71 (01/02 0449)  Labs:  Recent Labs  12/21/16 0552 12/22/16 0354 12/23/16 0355  HGB  --   --  11.8*  HCT  --   --  33.6*  PLT  --   --  178  CREATININE  --   --  0.58  CKTOTAL 24,128* 20,995* 18,730*    Estimated Creatinine Clearance: 82.8 mL/min (by C-G formula based on SCr of 0.58 mg/dL).   Medical History: Past Medical History:  Diagnosis Date  . Anemia, iron deficiency 01/27/2012  . Clotting disorder ([place])   . Colon cancer ([place])    T3N0M0, emergency surgery/with colostomy done-no further tx.  . Complication of anesthesia    "hard time getting breath" post op  . DVT (deep venous thrombosis) ([place])   . Hyperlipemia   . Hypertension   . Pulmonary embolism ([place])    taking injections -last done 1'14(Arixtra)    Medications:  Prescriptions Prior to Admission  Medication Sig Dispense Refill Last Dose  . hydrochlorothiazide (HYDRODIURIL) 25 MG tablet    12/13/2016 at Unknown time  . hydrochlorothiazide (HYDRODIURIL) 25 MG tablet Take 25 mg by mouth every morning.   Taking   Scheduled:  . dextromethorphan-guaiFENesin  1 tablet Oral BID  . polyethylene glycol  17 g Oral Daily  . potassium chloride  40 mEq Oral BID   Infusions:  . sodium chloride 200 mL/hr at 12/23/16 [id number]    Assessment: 61yo female started on LMWH for acute DVT now to transition to Xarelto; last dose of Lovenox given this am at 0517.   Plan:  Will start Xarelto 15mg  BID x21d then 20mg  daily and begin anticoag education.  Wynona Neat, PharmD, BCPS  12/23/2016,7:42 AM

## 2016-12-23 NOTE — Care Management Note (Addendum)
Case Management Note  Patient Details  Name: Mallory Morales MRN: [id number] Date of Birth: [birth year]  Subjective/Objective:           Adm w rhabdomyolysis         Action/Plan: lives at home, pcp dr [name]   Expected Discharge Date:  12/16/16               Expected Discharge Plan:  Home/Self Care  In-House Referral:     Discharge planning Services  CM Consult, Medication Assistance  Post Acute Care Choice:    Choice offered to:     DME Arranged:    DME Agency:     HH Arranged:    [place] Agency:     Status of Service:  Completed, signed off  If discussed at [employer] of Stay Meetings, dates discussed:    Additional Comments: pt going home on xarelto. Gave pt 30day free and no copay card for xarelto./W FELICIA @ [facility] [employer] # [phone]   XARELTO 20 MG 30 / 30 TAB   COVER- YES  CO-PAY- $ 35.00  TIER- 2 DRUG  PRIOR APPROVAL- NO  PHARMACY : [facility] Lacretia Leigh, RN 12/23/2016, 9:56 AM

## 2016-12-23 NOTE — Consult Note (Signed)
Referral MD  Reason for Referral: Right brachial vein thrombus; rhabdomyolysis secondary to statin use; history of stage II colon cancer   Chief Complaint  Patient presents with  . Generalized Body Aches  : I have a blood clot in my right arm.  HPI: Mallory Morales is well-known to me. She is a very nice 61 year old African-American female. She has a history of stage II colon cancer. She did not require any adjuvant chemotherapy. She is diagnosed 65 years ago.  Postop, she did have a pulmonary embolism and lower extremity DVT. She was on Arixtra for 1 year.  I last saw her back in September. She is having some abdominal discomfort. We did do a CT scan. The CT scan was unremarkable outside of some constipation.  She apparently inadvertently took an increased dose of her statin drug for cholesterol. She did again have a lot of achiness and cramps in her lower extremities. She was admitted with rhabdomyolysis on December 23. She'll be given IV fluids. Thankfully, she did not have any renal failure. [name] was greater than 50,000. This is come down. She had elevated LFTs. These are improving. Today, [name] was 18,730.  She had IV in her right arm. She did again have some swelling. She had a Doppler of her right arm yesterday. She had a thrombus in the brachial vein. She now is on Lovenox.  We're asked to see her to help with management.  When she had her blood clot 5 years ago, hypercoagulable studies were negative.  She says that the pain in her right arm is feeling better. It is not as swollen.    Past Medical History:  Diagnosis Date  . Anemia, iron deficiency 01/27/2012  . Clotting disorder ([place])   . Colon cancer ([place])    T3N0M0, emergency surgery/with colostomy done-no further tx.  . Complication of anesthesia    "hard time getting breath" post op  . DVT (deep venous thrombosis) ([place])   . Hyperlipemia   . Hypertension   . Pulmonary embolism ([facility])    taking injections -last done  1'14(Arixtra)  :  Past Surgical History:  Procedure Laterality Date  . ABDOMINAL HYSTERECTOMY    . COLON RESECTION  01/14/2012   Procedure: COLON RESECTION;  Surgeon: Odis Hollingshead, MD;  Location: [facility];  Service: General;  Laterality: N/A;  left colectomy, mobilization of splenic flexure, ostomy.  . COLOSTOMY  01/14/2012  . COLOSTOMY TAKEDOWN N/A 02/24/2013   Procedure: Laparoscopic Assisted Colostomy Closure;  Surgeon: Odis Hollingshead, MD;  Location: [facility];  Service: General;  Laterality: N/A;  Laparoscopic Assisted Colostomy Closure  . FLEXIBLE SIGMOIDOSCOPY N/A 03/17/2014   Procedure: FLEXIBLE SIGMOIDOSCOPY;  Surgeon: Beryle Beams, MD;  Location: [facility];  Service: Endoscopy;  Laterality: N/A;  . FLEXIBLE SIGMOIDOSCOPY N/A 04/07/2014   Procedure: FLEXIBLE SIGMOIDOSCOPY;  Surgeon: Beryle Beams, MD;  Location: [facility];  Service: Endoscopy;  Laterality: N/A;  no sedation  . HERNIA REPAIR    . HOT HEMOSTASIS N/A 03/17/2014   Procedure: HOT HEMOSTASIS (ARGON PLASMA COAGULATION/BICAP);  Surgeon: Beryle Beams, MD;  Location: [facility];  Service: Endoscopy;  Laterality: N/A;  . HOT HEMOSTASIS N/A 04/07/2014   Procedure: HOT HEMOSTASIS (ARGON PLASMA COAGULATION/BICAP);  Surgeon: Beryle Beams, MD;  Location: [facility];  Service: Endoscopy;  Laterality: N/A;  :   Current Facility-Administered Medications:  .  0.9 %  sodium chloride infusion, , Intravenous, Continuous, Modena Jansky, MD, Last Rate: 200 mL/hr  at 12/23/16 0519 .  albuterol (PROVENTIL) (2.5 MG/3ML) 0.083% nebulizer solution 2.5 mg, 2.5 mg, Nebulization, Q2H PRN, Norval Morton, MD .  benzonatate (TESSALON) capsule 200 mg, 200 mg, Oral, TID PRN, Norval Morton, MD, 200 mg at 12/14/16 0442 .  dextromethorphan-guaiFENesin (MUCINEX DM) 30-600 MG per 12 hr tablet 1 tablet, 1 tablet, Oral, BID, Maryann Mikhail, DO, 1 tablet at 12/22/16 2202 .  guaiFENesin-codeine 100-10 MG/5ML solution 5 mL, 5 mL, Oral, Q4H  PRN, Maryann Mikhail, DO, 5 mL at 12/18/16 2039 .  ondansetron (ZOFRAN) tablet 4 mg, 4 mg, Oral, Q6H PRN **OR** ondansetron (ZOFRAN) injection 4 mg, 4 mg, Intravenous, Q6H PRN, Rondell A Smith, MD .  polyethylene glycol (MIRALAX / GLYCOLAX) packet 17 g, 17 g, Oral, Daily, Maryann Mikhail, DO, 17 g at 12/22/16 0901 .  potassium chloride SA (K-DUR,KLOR-CON) CR tablet 40 mEq, 40 mEq, Oral, BID, Maryann Mikhail, DO, 40 mEq at 12/22/16 2202 .  traMADol (ULTRAM) tablet 50 mg, 50 mg, Oral, Q6H PRN, Alexis Hugelmeyer, DO, 50 mg at 12/22/16 0901:  . dextromethorphan-guaiFENesin  1 tablet Oral BID  . polyethylene glycol  17 g Oral Daily  . potassium chloride  40 mEq Oral BID  :  Allergies  Allergen Reactions  . Simvastatin     Severe rhabdomyolysis.  . Strawberry Extract Swelling    Face only. Breathing not affected.  :  Family History  Problem Relation Age of Onset  . Heart attack Father   . Hypertension Father   . Heart disease Father   . Stomach cancer Sister   . Pancreatic cancer Brother   . Cancer Brother     pancreatic  :  Social History   Social History  . Marital status: Widowed    Spouse name: N/A  . Number of children: N/A  . Years of education: N/A   Occupational History  . Not on file.   Social History Main Topics  . Smoking status: Former Smoker    Packs/day: 0.50    Years: 11.00    Types: Cigarettes    Start date: 08/26/1974    Quit date: 05/16/1985  . Smokeless tobacco: Never Used     Comment: quit 28 years ago  . Alcohol use 0.6 oz/week    1 Glasses of wine per week     Comment: Occ. wine  . Drug use: No  . Sexual activity: No   Other Topics Concern  . Not on file   Social History Narrative  . No narrative on file  :  Pertinent items are noted in HPI.  Exam: Patient Vitals for the past 24 hrs:  BP Temp Temp src Pulse Resp SpO2  12/23/16 0449 125/71 98.6 F (37 C) Oral 71 18 98 %  12/22/16 2141 137/72 97.7 F (36.5 C) Oral 88 19 100 %   12/22/16 1400 125/70 98 F (36.7 C) Oral 75 18 100 %    As above    Recent Labs  12/23/16 0355  WBC 4.1  HGB 11.8*  HCT 33.6*  PLT 178    Recent Labs  12/23/16 0355  NA 138  K 4.1  CL 108  CO2 21*  GLUCOSE 104*  BUN 8  CREATININE 0.58  CALCIUM 8.6*    Blood smear review:  None  Pathology: None     Assessment and Plan:  Mallory Morales is a 61 year old African-American female. She has another thrombus. I would hate to have to commit her to lifelong anticoagulation. However, this  might be necessary. I will check his some additional hypercoagulable studies. I will do this when I see her in the office.  I think that Xarelto would be appropriate for her. I would make sure that this is covered by her insurance.  I talked to her about this. I explained to her that Xarelto would be my recommendation. I told her that levels would not have to be monitored. I told her that this was a once a day pill after she takes a loading dose for 3 weeks.  I told her that the only side effect that I knew of was the potential for bleeding but the risk of bleeding is less than 5%. I told her that Xarelto, despite what is being reported on TV commercials, is safer than Coumadin.  She like to be on Xarelto. I would have her on Xarelto for at least a year and possibly even longer.  The past that she had an IV in that arm could certainly have been the factor that precipitated the thrombus.  Otherwise, she looks quite good. I don't see anything that suggest an underlying malignancy. She does had a scan done 3 months ago.  I spent about 40 minutes with her today. As always, we had good fellowship. Her faith is very strong.  Hopefully, she will be oh to go home in a day or so. I will see her back in the office in a couple weeks.  Lattie Haw, MD  Psalm 1:6

## 2016-12-23 NOTE — Progress Notes (Signed)
PROGRESS NOTE    Mallory Morales  IOE:[id number] DOB: [birth year] DOA: 12/13/2016 PCP: Simona Huh, MD   Chief Complaint  Patient presents with  . Generalized Body Aches     Brief Narrative:  61 y.o. female with medical history significant of HTN, HLD, T3N0 colon cancer s/p removal, DVT, and PE who completed 1 year of Arixtra; presented with complaints of bilateral lower extremity crampy pain. Admitted for acute rhabdo, elevated LFTs. Treated with IV fluids. Initially there was no improvement in [name] which remained >50 K for several days. Finally after aggressive IV fluid hydration, CK has been gradually improving. It is possible that [name] was so high for the first several days and greater than 50 K that we were not seeing a change in the numbers. Discussed with rheumatology and she can follow-up with them as outpatient (details in follow up section of discharge). On 12/22/16, diagnosed with right upper extremity DVT, possibly provoked by peripheral IV line. Started on Lovenox bridging And placed on Xarelto  Additional history: Patient states that she was in her usual state of health until 4-5 days before Christmas when she developed "scratchy throat", high fever and generalized body aches. She took some OTC medications including TheraFlu and her symptoms resolved. Couple of days later, she noted worsening pain in her lower extremities, especially in her coughs when extent where she was unable to walk which brought her to the hospital. She may have taken doubled the dose of her statins for a couple days prior to admission.  Assessment & Plan   Acute Rhabdomyolysis - Since admission, her statins were held, she was aggressively hydrated with IV fluids and has had good urine output with normal creatinine. Despite these measures, [name] remains >50 K. Her rhabdomyolysis was initially felt to be related to statins and she may have taken more than prescribed doses for a couple of days prior to  admission. GI has seen and suspect that her mildly elevated LFTs are secondary to rhabdomyolysis and have signed off on 12/28. Lower extremity venous Dopplers negative for DVT. Aldolase and CRP negative. ANA negative. ESR minimally elevated at 25. Uric acid normal. - On 12/28, Dr. Lemmie Evens did discuss her care extensively with the rheumatologist/Dr. Amil Amen who advised that her clinical picture is still suspicious for rhabdomyolysis which may be post acute viral illness recently compounded by statins. Given how high the CK is, he feels that polymyositis and dermatomyositis seem less likely. Also her symptoms are more distal than proximal which argues against polymyositis. We agreed on obtaining MRI of her lower extremities for possible myositis. He did not recommend obtaining a biopsy or steroids at this time. He indicated that if patient's pain was better, otherwise stable, she could be discharged and would kindly see her in outpatient consultation. - MRI of lower extremities shows nonspecific myositis of the posterior compartments of the thighs along with cough muscles bilaterally. No findings for cellulitis, septic arthritis or osteomyelitis. - CK started decreasing from 12/29. Continue IV fluids and follow CK.  CK down to 20K. discontinue to IV fluid  Force oral fluids Repeat CPK a.m.   Hypokalemia -Resolved - Magnesium level 2  Essential hypertension -Resumed HCTZ at 12.5 mg dose  Hyperlipidemia - DC simvastatin. She should not be on statins given serious adverse reaction during this admission. Added simvastatin to allergies.  Elevated LFTS -likely secondary to the above -His continued IV fluid 1 2 2018 -RUQ US unremarkable  -Monitor CMP -hepatitis panel negative -  Gastroenterology consulted and appreciated input. GI signed off.  History of colon cancer -Review of records shows that her CEA levels have been on upward trend with the last CEA level noted as 5 (was 8 prior to that).  -CT  abd/pelvis 09/13/2016 showed no recurrence   -Patient is followed by Dr. Burney Gauze. Last flexible sigmoidoscopy was in 2015 and she is due for a repeat in 2018. -Appreciate oncology input  New right upper extremity DVT/History of DVT/PE -Thought to be related to patient's colon cancer, but is no longer on anticoagulation. Lower extremity venous Dopplers negative for DVT. - On 12/22/16 venous Doppler confirmed acute DVT noted in right brachial vein. Discussed in detail with patient regarding options of oral anticoagulation. -See above discussion  Constipation -Continue miralax  Thromobocytopenia - Chronic and intermittent. Stable. Heparin discontinued and on SCDs for DVT prophylaxis. Platelets normal 12/30. Follow CBCs.  DVT Prophylaxis  SCDs. Now on full dose Lovenox.  Code Status: Full  Family Communication: None at bedside  Disposition Plan: DC home possibly 12/23/16 pending oncology input.  Consultants Gastroenterology, Dr. Collene Mares  Procedures  RUQ [place]  Antibiotics   Anti-infectives    None      Subjective:   Doing fair Thigh pain back pain all her result Feels closer to normal Eating and drinking Still having tenderness around the site of her right arm  Objective:   Vitals:   12/22/16 1400 12/22/16 2141 12/23/16 0449 12/23/16 1457  BP: 125/70 137/72 125/71 (!) 165/88  Pulse: 75 88 71 83  Resp: '18 19 18   '$ Temp: 98 F (36.7 C) 97.7 F (36.5 C) 98.6 F (37 C) 98.5 F (36.9 C)  TempSrc: Oral Oral Oral Oral  SpO2: 100% 100% 98% 100%  Weight:      Height:        Intake/Output Summary (Last 24 hours) at 12/23/16 1619 Last data filed at 12/23/16 1300  Gross per 24 hour  Intake             4212 ml  Output                0 ml  Net             4212 ml   Filed Weights   12/13/16 1724 12/14/16 0538  Weight: 80.7 kg (178 lb) 85.5 kg (188 lb 7.9 oz)    Exam  General: Well developed, well nourished, NAD. Sitting up comfortably in bed.  HEENT: NCAT, mucous  membranes moist.   Cardiovascular: S1 S2 auscultated, RRR, no murmurs  Respiratory: Clear to auscultation bilaterally with equal chest rise  Abdomen: Soft, nontender, nondistended, + bowel sounds  Extremities: warm dry without cyanosis clubbing or edema. Grade 5 x 5 power in all extremities. Good peripheral pulses felt. No compartment syndrome.  Neuro: AAOx3, nonfocal  Psych: Normal affect and demeanor with intact judgement and insight, pleasant  Data Reviewed: I have personally reviewed following labs and imaging studies  CBC:  Recent Labs Lab 12/17/16 0407 12/20/16 0648 12/23/16 0355  WBC 4.4 4.7 4.1  HGB 12.1 11.9* 11.8*  HCT 35.5* 33.5* 33.6*  MCV 87.4 87.2 86.6  PLT 130* 160 440   Basic Metabolic Panel:  Recent Labs Lab 12/17/16 0407 12/18/16 0356 12/20/16 0648 12/23/16 0355  NA 141 139 140 138  K 4.8 4.3 4.4 4.1  CL 108 107 108 108  CO2 '28 25 27 '$ 21*  GLUCOSE 126* 112* 116* 104*  BUN '7 8 8 '$ 8  CREATININE 0.67 0.64 0.66 0.58  CALCIUM 8.7* 8.7* 8.7* 8.6*  PHOS  --  4.0  --  4.8*   GFR: Estimated Creatinine Clearance: 82.8 mL/min (by C-G formula based on SCr of 0.58 mg/dL). Liver Function Tests:  Recent Labs Lab 12/17/16 0407 12/18/16 0356 12/20/16 0648 12/23/16 0355  AST 406* 346* 158* 130*  ALT 169* 185* 142* 115*  ALKPHOS 44 50 45 51  BILITOT 0.8 0.4 0.7 0.7  PROT 5.5* 5.7* 5.1* 5.5*  ALBUMIN 3.0* 3.0*  3.0* 2.7* 2.9*   No results for input(s): LIPASE, AMYLASE in the last 168 hours. No results for input(s): AMMONIA in the last 168 hours. Coagulation Profile: No results for input(s): INR, PROTIME in the last 168 hours. Cardiac Enzymes:  Recent Labs Lab 12/19/16 0418 12/20/16 0648 12/21/16 0552 12/22/16 0354 12/23/16 0355  CKTOTAL 46,904* 31,548* 24,128* 20,995* 18,730*   BNP (last 3 results) No results for input(s): PROBNP in the last 8760 hours. HbA1C: No results for input(s): HGBA1C in the last 72 hours. CBG: No results for  input(s): GLUCAP in the last 168 hours. Lipid Profile: No results for input(s): CHOL, HDL, LDLCALC, TRIG, CHOLHDL, LDLDIRECT in the last 72 hours. Thyroid Function Tests: No results for input(s): TSH, T4TOTAL, FREET4, T3FREE, THYROIDAB in the last 72 hours. Anemia Panel: No results for input(s): VITAMINB12, FOLATE, FERRITIN, TIBC, IRON, RETICCTPCT in the last 72 hours. Urine analysis:    Component Value Date/Time   COLORURINE YELLOW 12/13/2016 2233   APPEARANCEUR CLEAR 12/13/2016 2233   LABSPEC 1.004 (L) 12/13/2016 2233   PHURINE 7.0 12/13/2016 2233   GLUCOSEU NEGATIVE 12/13/2016 2233   HGBUR LARGE (A) 12/13/2016 2233   BILIRUBINUR NEGATIVE 12/13/2016 2233   KETONESUR NEGATIVE 12/13/2016 2233   PROTEINUR NEGATIVE 12/13/2016 2233   UROBILINOGEN 1.0 01/30/2012 1450   NITRITE NEGATIVE 12/13/2016 2233   LEUKOCYTESUR TRACE (A) 12/13/2016 2233      Radiology Studies: No results found.   Scheduled Meds: . dextromethorphan-guaiFENesin  1 tablet Oral BID  . hydrochlorothiazide  12.5 mg Oral Daily  . polyethylene glycol  17 g Oral Daily  . potassium chloride  40 mEq Oral BID  . rivaroxaban  15 mg Oral BID WC   Followed by  . [START ON 01/13/2017] rivaroxaban  20 mg Oral Q supper   Continuous Infusions: . sodium chloride 200 mL/hr at 12/23/16 1435     LOS: 10 days   Verneita Griffes, MD [facility] (P) [phone]  If 7PM-7AM, please contact night-coverage [email or web address] Password [email or web address] 12/23/2016, 4:19 PM

## 2016-12-23 NOTE — Discharge Instructions (Signed)
Information on my medicine - XARELTO (rivaroxaban)  This medication education was reviewed with me or my healthcare representative as part of my discharge preparation.  The pharmacist that spoke with me during my hospital stay was:  [facility], Margot Chimes, [place]? Xarelto was prescribed to treat blood clots that may have been found in the veins of your legs (deep vein thrombosis) or in your lungs (pulmonary embolism) and to reduce the risk of them occurring again.  What do you need to know about Xarelto? The starting dose is one 15 mg tablet taken TWICE daily with food for the FIRST 21 DAYS then on 01/13/17  the dose is changed to one 20 mg tablet taken ONCE A DAY with your evening meal.  DO NOT stop taking Xarelto without talking to the health care provider who prescribed the medication.  Refill your prescription for 20 mg tablets before you run out.  After discharge, you should have regular check-up appointments with your healthcare provider that is prescribing your Xarelto.  In the future your dose may need to be changed if your kidney function changes by a significant amount.  What do you do if you miss a dose? If you are taking Xarelto TWICE DAILY and you miss a dose, take it as soon as you remember. You may take two 15 mg tablets (total 30 mg) at the same time then resume your regularly scheduled 15 mg twice daily the next day.  If you are taking Xarelto ONCE DAILY and you miss a dose, take it as soon as you remember on the same day then continue your regularly scheduled once daily regimen the next day. Do not take two doses of Xarelto at the same time.   Important Safety Information Xarelto is a blood thinner medicine that can cause bleeding. You should call your healthcare provider right away if you experience any of the following: ? Bleeding from an injury or your nose that does not stop. ? Unusual colored urine (red or dark brown) or  unusual colored stools (red or black). ? Unusual bruising for unknown reasons. ? A serious fall or if you hit your head (even if there is no bleeding).  Some medicines may interact with Xarelto and might increase your risk of bleeding while on Xarelto. To help avoid this, consult your healthcare provider or pharmacist prior to using any new prescription or non-prescription medications, including herbals, vitamins, non-steroidal anti-inflammatory drugs (NSAIDs) and supplements.  This website has more information on Xarelto: [email or web address]

## 2016-12-24 DIAGNOSIS — E785 Hyperlipidemia, unspecified: Secondary | ICD-10-CM

## 2016-12-24 DIAGNOSIS — I10 Essential (primary) hypertension: Secondary | ICD-10-CM

## 2016-12-24 DIAGNOSIS — C186 Malignant neoplasm of descending colon: Secondary | ICD-10-CM

## 2016-12-24 LAB — CK: CK TOTAL: 16412 U/L — AB (ref 38–234)

## 2016-12-24 MED ORDER — RIVAROXABAN (XARELTO) VTE STARTER PACK (15 & 20 MG): ORAL_TABLET | ORAL | 0 refills | Status: DC

## 2016-12-24 MED ORDER — POTASSIUM CHLORIDE CRYS ER 20 MEQ PO TBCR: 40.0000 meq | EXTENDED_RELEASE_TABLET | Freq: Two times a day (BID) | ORAL | 0 refills | Status: DC

## 2016-12-24 NOTE — Progress Notes (Signed)
Richardean Sale to be D/C'd  per MD order. Discussed with the patient and all questions fully answered.  VSS, Skin clean, dry and intact without evidence of skin break down, no evidence of skin tears noted.  IV catheter discontinued intact. Site without signs and symptoms of complications. Dressing and pressure applied.  An After Visit Summary was printed and given to the patient. Patient received prescription.  D/c education completed with patient/family including follow up instructions, medication list, d/c activities limitations if indicated, with other d/c instructions as indicated by MD - patient able to verbalize understanding, all questions fully answered.   Patient instructed to return to ED, call 911, or call MD for any changes in condition.   Patient declined WC transport,wishes to walk down, and D/C home via private auto.

## 2016-12-24 NOTE — Progress Notes (Signed)
Patient for discharge home with no apparent distress noted; waiting to be picked up by her friend. Medications and discharge instructions explained to the patient. She verbalized understanding. Copies given to her including original prescription. IV saline lock removed.

## 2016-12-24 NOTE — Discharge Summary (Signed)
Physician Discharge Summary  Mallory Morales FGH:[id number] DOB: Dec 29, 1955 DOA: 12/13/2016  PCP: Simona Huh, MD  Admit date: 12/13/2016 Discharge date: 12/24/2016  Time spent: 40 minutes  Recommendations for Outpatient Follow-up:  1. Patient needs outpatient follow-up with basic metabolic panel and 1 more test of a CPK level in about 1-2 weeks 2. She should not use any further Zocor going forward and would be very hesitant to use a statin moving forward 3. Patient can be resumed on HCTZ 4. Need outpatient coordination going forward with Dr. Amil Amen non-emergently for Rheumatological work-up of DVT 5. 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 Algis Morales 61 y.o.?  HTN,  HLD,  T3N0 colon cancer s/p removal,  DVT, and PE who completed 1 year of Arixtra;  presented with complaints of bilateral lower extremity crampy pain. Admitted for acute rhabdo, elevated LFTs. Treated with IV fluids. Initially there was no improvement in [name] which remained >50 K for several days. Finally after aggressive IV fluid hydration, CK has been gradually improving. It is possible that [name] was so high for the first several days and greater than 50 K that we were not seeing a change in the numbers. Discussed with rheumatology and she can follow-up with them as outpatient (details in follow up section of discharge). On 12/22/16, diagnosed with right upper extremity DVT, possibly provoked by peripheral IV line. Started on Lovenox bridging And placed on [facility]  Course:   Acute Rhabdomyolysis - Since admission, her statins were held, she was aggressively hydrated with IV fluids and has had good urine output with normal creatinine.   CK remained >50 K. nitially felt to be related to statins and she may have taken more than prescribed doses for a couple of days prior to admission.  GI consulted and felt mildly elevated LFTs are secondary to rhabdomyolysis and have signed off on 12/28.  Lower extremity venous Dopplers negative for DVT.  Aldolase and CRP negative. ANA negative. ESR minimally elevated at 25. Uric acid normal. - On 12/28, Dr. Lemmie Evens did discuss her care extensively with the rheumatologist/Dr. Amil Amen who advised that her clinical picture is still suspicious for rhabdomyolysis which may be post acute viral illness recently compounded by statins.  -Felt that that polymyositis and dermatomyositis seem less likely. Also her symptoms are more distal than proximal which argues against polymyositis.  We agreed on obtaining MRI of her lower extremities for possible myositis. He did not recommend obtaining a biopsy or steroids at this time. He indicated that if patient's pain was better, otherwise stable, she could be discharged and would kindly see her in outpatient consultation. - MRI of lower extremities shows nonspecific myositis of the posterior compartments of the thighs along with cough muscles bilaterally. No findings for cellulitis, septic arthritis or osteomyelitis. - CK started decreasing from 12/29. Continue IV fluids and follow CK.  CK down to 20K. discontinue to IV fluid  Force oral fluids Repeat CPK a.m.   Hypokalemia -Resolved - Magnesium level 2  Essential hypertension -Resumed HCTZ at  25 mg daily dose  Hyperlipidemia - DC simvastatin. She should not be on statins given serious adverse reaction during this admission. Added simvastatin to allergies.  Elevated LFTS -likely secondary to the above -His continued IV fluid 1 2  2018 -RUQ US unremarkable  -Monitor CMP -hepatitis panel negative -Gastroenterology consulted and appreciated input. GI signed off.  History of colon cancer -Review of records shows that her CEA levels have been on upward trend with the last CEA level noted as 5 (was 8 prior to that).  -CT abd/pelvis 09/13/2016 showed no recurrence   -Patient is followed by Dr. Burney Gauze. Last flexible sigmoidoscopy was in 2015 and she is due for a repeat in 2018. -Appreciate oncology input  New right upper extremity DVT/History of DVT/PE -Thought to be related to patient's colon cancer, butis no longer on anticoagulation. Lower extremity venous Dopplers negative for DVT. - On 12/22/16 venous Doppler confirmed acute DVT noted in right brachial vein. Discussed in detail with patient regarding options of oral anticoagulation. -See above discussion  Constipation -Continue miralax  Thromobocytopenia - Chronic and intermittent. Stable. Heparin discontinued and on SCDs for DVT prophylaxis. Platelets normal 12/30. Follow CBCs.   Procedures:  MRI lower extremities 12/28  Doppler ultrasound in 12/22/2016-acute DVT noted in the right brachial vein. There is acute SVT noted in the right cephalic and basilic veins.   Consultations:  none  Discharge Exam: Vitals:   12/23/16 2154 12/24/16 0524  BP: (!) 146/88 135/78  Pulse: 83 71  Resp:    Temp: 98.2 F (36.8 C) 98.3 F (36.8 C)    Slight headache this morning-feels as if she hasn't taken her blood rashes have No other issue at present  General: Alert [place] and oriented no apparent issue Cardiovascular: S1-S2 no murmur rub or gallop Respiratory: Clinically clear no added sound Abdomen soft nontender nondistended no rebound or guarding Right upper extremity slightly tender and warm.jprog   Discharge Instructions   Discharge Instructions    Diet - low sodium heart healthy    Complete by:  As directed    Discharge instructions     Complete by:  As directed    Please take your blood thinner as directed and you need follow-up with your oncologist as an outpatient Please do not take anymore simvastatin You can continue your blood pressure medications without change Drink plenty of water for the first 2-3 days after discharge Best of luck for the new year 2018   Increase activity slowly    Complete by:  As directed      Current Discharge Medication List    START taking these medications   Details  potassium chloride SA (K-DUR,KLOR-CON) 20 MEQ tablet Take 2 tablets (40 mEq total) by mouth 2 (two) times daily. Qty: 20 tablet, Refills: 0    Rivaroxaban 15 & 20 MG TBPK Take as directed on package: Start with one '15mg'$  tablet by mouth twice a day with food. On Day 22, switch to one '20mg'$  tablet once a day with food. 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 Simvastatin     Severe rhabdomyolysis.  . Strawberry Extract Swelling    Face only. Breathing not affected.   Follow-up Information  Hennie Duos, MD. Schedule an appointment as soon as possible for a visit in 1 week(s).   Specialty:  Rheumatology Contact information: [place] [address] [phone]            The results of significant diagnostics from this hospitalization (including imaging, microbiology, ancillary and laboratory) are listed below for reference.    Significant Diagnostic Studies: Mr Tibia Fibula Right Wo Contrast  Result Date: 12/18/2016 CLINICAL DATA:  Bilateral lower extremity pain for 1 week with rhabdomyolysis. EXAM: MR OF THE LEFT FEMUR WITHOUT CONTRAST; MRI OF LOWER RIGHT EXTREMITY WITHOUT CONTRAST; MRI OF THE RIGHT FEMUR WITHOUT CONTRAST; MRI  OF LOWER LEFT EXTREMITY WITHOUT CONTRAST TECHNIQUE: Multiplanar, multisequence MR imaging of the bilateral lower extremities was performed. No intravenous contrast was administered. COMPARISON:  None. FINDINGS: Bilateral nonspecific edema like signal abnormality in the thigh and calf musculature. This is most pronounced in the posterior compartment of both thighs. It is fairly symmetric bilaterally. The muscles are enlarged and demonstrate diffuse edema. There is also some fluid surrounding the muscles. Similar findings involving the upper calf musculature bilaterally although not as significant as the thighs. This is mainly involving the gastrocs muscles bilaterally. The soleus muscles are less in all. Anterior compartment muscles are also less involved. No significant bony findings. No findings for septic arthritis or osteomyelitis. IMPRESSION: 1. Nonspecific myositis involving the posterior compartments of the thighs along with the calf musculature bilaterally. This is a nonspecific finding and can be seen with trauma, overuse syndromes, drug reaction, inflammatory myopathys such as polymyositis, etc. 2. No findings for cellulitis, septic arthritis or osteomyelitis. Electronically Signed   By: Marijo Sanes M.D.   On: 12/18/2016 19:39   Mr Frmur Right Wo Contrast  Result Date: 12/18/2016 CLINICAL DATA:  Bilateral lower extremity pain for 1 week with rhabdomyolysis. EXAM: MR OF THE LEFT FEMUR WITHOUT CONTRAST; MRI OF LOWER RIGHT EXTREMITY WITHOUT CONTRAST; MRI OF THE RIGHT FEMUR WITHOUT CONTRAST; MRI OF LOWER LEFT EXTREMITY WITHOUT CONTRAST TECHNIQUE: Multiplanar, multisequence MR imaging of the bilateral lower extremities was performed. No intravenous contrast was administered. COMPARISON:  None. FINDINGS: Bilateral nonspecific edema like signal abnormality in the thigh and calf musculature. This is most pronounced in the posterior compartment of both thighs. It is fairly symmetric bilaterally. The muscles  are enlarged and demonstrate diffuse edema. There is also some fluid surrounding the muscles. Similar findings involving the upper calf musculature bilaterally although not as significant as the thighs. This is mainly involving the gastrocs muscles bilaterally. The soleus muscles are less in all. Anterior compartment muscles are also less involved. No significant bony findings. No findings for septic arthritis or osteomyelitis. IMPRESSION: 1. Nonspecific myositis involving the posterior compartments of the thighs along with the calf musculature bilaterally. This is a nonspecific finding and can be seen with trauma, overuse syndromes, drug reaction, inflammatory myopathys such as polymyositis, etc. 2. No findings for cellulitis, septic arthritis or osteomyelitis. Electronically Signed   By: Marijo Sanes M.D.   On: 12/18/2016 19:39   Mr Tibia Fibula Left Wo Contrast  Result Date: 12/18/2016 CLINICAL DATA:  Bilateral lower extremity pain for 1 week with rhabdomyolysis. EXAM: MR OF THE LEFT FEMUR WITHOUT CONTRAST; MRI OF LOWER RIGHT EXTREMITY WITHOUT CONTRAST; MRI OF THE RIGHT FEMUR WITHOUT CONTRAST; MRI OF LOWER LEFT EXTREMITY WITHOUT CONTRAST TECHNIQUE: Multiplanar, multisequence MR imaging of the bilateral lower extremities was performed. No intravenous contrast was administered. COMPARISON:  None. FINDINGS: Bilateral nonspecific  edema like signal abnormality in the thigh and calf musculature. This is most pronounced in the posterior compartment of both thighs. It is fairly symmetric bilaterally. The muscles are enlarged and demonstrate diffuse edema. There is also some fluid surrounding the muscles. Similar findings involving the upper calf musculature bilaterally although not as significant as the thighs. This is mainly involving the gastrocs muscles bilaterally. The soleus muscles are less in all. Anterior compartment muscles are also less involved. No significant bony findings. No findings for septic  arthritis or osteomyelitis. IMPRESSION: 1. Nonspecific myositis involving the posterior compartments of the thighs along with the calf musculature bilaterally. This is a nonspecific finding and can be seen with trauma, overuse syndromes, drug reaction, inflammatory myopathys such as polymyositis, etc. 2. No findings for cellulitis, septic arthritis or osteomyelitis. Electronically Signed   By: Marijo Sanes M.D.   On: 12/18/2016 19:39   Mr Femur Left Wo Contrast  Result Date: 12/18/2016 CLINICAL DATA:  Bilateral lower extremity pain for 1 week with rhabdomyolysis. EXAM: MR OF THE LEFT FEMUR WITHOUT CONTRAST; MRI OF LOWER RIGHT EXTREMITY WITHOUT CONTRAST; MRI OF THE RIGHT FEMUR WITHOUT CONTRAST; MRI OF LOWER LEFT EXTREMITY WITHOUT CONTRAST TECHNIQUE: Multiplanar, multisequence MR imaging of the bilateral lower extremities was performed. No intravenous contrast was administered. COMPARISON:  None. FINDINGS: Bilateral nonspecific edema like signal abnormality in the thigh and calf musculature. This is most pronounced in the posterior compartment of both thighs. It is fairly symmetric bilaterally. The muscles are enlarged and demonstrate diffuse edema. There is also some fluid surrounding the muscles. Similar findings involving the upper calf musculature bilaterally although not as significant as the thighs. This is mainly involving the gastrocs muscles bilaterally. The soleus muscles are less in all. Anterior compartment muscles are also less involved. No significant bony findings. No findings for septic arthritis or osteomyelitis. IMPRESSION: 1. Nonspecific myositis involving the posterior compartments of the thighs along with the calf musculature bilaterally. This is a nonspecific finding and can be seen with trauma, overuse syndromes, drug reaction, inflammatory myopathys such as polymyositis, etc. 2. No findings for cellulitis, septic arthritis or osteomyelitis. Electronically Signed   By: Marijo Sanes M.D.    On: 12/18/2016 19:39   Dg Chest Port 1 View  Result Date: 12/14/2016 CLINICAL DATA:  Dry cough for 5 days, history hypertension, former smoker, colon cancer, pulmonary embolism EXAM: PORTABLE CHEST 1 VIEW COMPARISON:  Portable exam 0854 hours compared to 01/30/2012 and correlated with interval CT chest of 09/07/2014 FINDINGS: Normal heart size, mediastinal contours, and pulmonary vascularity. Lungs clear. No pleural effusion or pneumothorax. Bones unremarkable. IMPRESSION: No acute abnormalities. Electronically Signed   By: Lavonia Dana M.D.   On: 12/14/2016 10:06   US Abdomen Limited Ruq  Result Date: 12/16/2016 CLINICAL DATA:  Elevated liver function tests, hypertension EXAM: US ABDOMEN LIMITED - RIGHT UPPER QUADRANT COMPARISON:  CT abdomen and pelvis of 09/13/2016 FINDINGS: Gallbladder: The gallbladder is visualized and no gallstones are noted. There is no pain over the gallbladder with compression. Common bile duct: Diameter: The common bile duct is normal measuring 3.7 mm in diameter. Liver: The liver is somewhat inhomogeneous but no focal abnormality is noted. IMPRESSION: Negative limited ultrasound of the right upper quadrant. Electronically Signed   By: Ivar Drape M.D.   On: 12/16/2016 10:24    Microbiology: No results found for this or any previous visit (from the past 240 hour(s)).   Labs: Basic Metabolic Panel:  Recent Labs Lab 12/18/16 0356 12/20/16 3664  12/23/16 0355  NA 139 140 138  K 4.3 4.4 4.1  CL 107 108 108  CO2 25 27 21*  GLUCOSE 112* 116* 104*  BUN '8 8 8  '$ CREATININE 0.64 0.66 0.58  CALCIUM 8.7* 8.7* 8.6*  PHOS 4.0  --  4.8*   Liver Function Tests:  Recent Labs Lab 12/18/16 0356 12/20/16 0648 12/23/16 0355  AST 346* 158* 130*  ALT 185* 142* 115*  ALKPHOS 50 45 51  BILITOT 0.4 0.7 0.7  PROT 5.7* 5.1* 5.5*  ALBUMIN 3.0*  3.0* 2.7* 2.9*   No results for input(s): LIPASE, AMYLASE in the last 168 hours. No results for input(s): AMMONIA in the last 168  hours. CBC:  Recent Labs Lab 12/20/16 0648 12/23/16 0355  WBC 4.7 4.1  HGB 11.9* 11.8*  HCT 33.5* 33.6*  MCV 87.2 86.6  PLT 160 178   Cardiac Enzymes:  Recent Labs Lab 12/20/16 0648 12/21/16 0552 12/22/16 0354 12/23/16 0355 12/24/16 0447  CKTOTAL 31,548* 24,128* 20,995* 18,730* 16,412*   BNP: BNP (last 3 results) No results for input(s): BNP in the last 8760 hours.  ProBNP (last 3 results) No results for input(s): PROBNP in the last 8760 hours.  CBG: No results for input(s): GLUCAP in the last 168 hours.     SignedNita Sells MD   [facility] 12/24/2016, 8:46 AM

## 2016-12-26 DIAGNOSIS — M6282 Rhabdomyolysis: Secondary | ICD-10-CM | POA: Diagnosis not present

## 2017-01-19 DIAGNOSIS — R7303 Prediabetes: Secondary | ICD-10-CM | POA: Diagnosis not present

## 2017-01-19 DIAGNOSIS — I10 Essential (primary) hypertension: Secondary | ICD-10-CM | POA: Diagnosis not present

## 2017-01-19 DIAGNOSIS — E7800 Pure hypercholesterolemia, unspecified: Secondary | ICD-10-CM | POA: Diagnosis not present

## 2017-01-19 DIAGNOSIS — M6282 Rhabdomyolysis: Secondary | ICD-10-CM | POA: Diagnosis not present

## 2017-01-19 DIAGNOSIS — Z85038 Personal history of other malignant neoplasm of large intestine: Secondary | ICD-10-CM | POA: Diagnosis not present

## 2017-01-20 ENCOUNTER — Ambulatory Visit (HOSPITAL_BASED_OUTPATIENT_CLINIC_OR_DEPARTMENT_OTHER)
Admission: RE | Admit: 2017-01-20 | Discharge: 2017-01-20 | Disposition: A | Discharge status: Home / Self Care | Payer: BLUE CROSS/BLUE SHIELD | Source: Ambulatory Visit | Attending: Podiatry | Admitting: Podiatry

## 2017-01-20 ENCOUNTER — Encounter: Payer: Self-pay | Admitting: Podiatry

## 2017-01-20 ENCOUNTER — Ambulatory Visit (INDEPENDENT_AMBULATORY_CARE_PROVIDER_SITE_OTHER): Payer: BLUE CROSS/BLUE SHIELD | Admitting: Podiatry

## 2017-01-20 ENCOUNTER — Telehealth: Payer: Self-pay | Admitting: *Deleted

## 2017-01-20 DIAGNOSIS — M6740 Ganglion, unspecified site: Secondary | ICD-10-CM | POA: Insufficient documentation

## 2017-01-20 DIAGNOSIS — M2010 Hallux valgus (acquired), unspecified foot: Secondary | ICD-10-CM

## 2017-01-20 DIAGNOSIS — M722 Plantar fascial fibromatosis: Secondary | ICD-10-CM | POA: Diagnosis not present

## 2017-01-20 DIAGNOSIS — M79671 Pain in right foot: Secondary | ICD-10-CM | POA: Insufficient documentation

## 2017-01-20 DIAGNOSIS — S99921A Unspecified injury of right foot, initial encounter: Secondary | ICD-10-CM | POA: Diagnosis not present

## 2017-01-20 NOTE — Progress Notes (Signed)
   Subjective:    Patient ID: Mallory Morales, female    DOB: [birth year], 61 y.o.   MRN: [id number]  HPI  61 year old female presents the office today for concerns of a nodule to the bottom of the right foot which is been growing slowly over the last several months. She states the areas not painful and she is not noticed any skin breakdown or any drainage. She denies stepping on any foreign objects. She's had no recent treatment for this.  She also has bunions to both of her feet which become painful with pressure in shoes. She has tried changing her shoes with some relief.  No other complaints today.  Review of Systems  All other systems reviewed and are negative.      Objective:   Physical Exam General: AAO x3, NAD  Dermatological: firm, nonmobile soft tissue masses present on the right foot within arch of the foot along the plantar fascia. There is no tenderness to palpation. There is mild hyperkeratotic tissue on this area. Upon debridement there is no puncture wound identified there is no ulceration.no other open lesions or pre-ulcerative lesions at this time.  Vascular: Dorsalis Pedis artery and Posterior Tibial artery pedal pulses are 2/4 bilateral with immedate capillary fill time. There is no pain with calf compression, swelling, warmth, erythema.   Neruologic: Grossly intact via light touch bilateral. Vibratory intact via tuning fork bilateral. Protective threshold with Semmes Wienstein monofilament intact to all pedal sites bilateral.   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 There is no pain or crepitation with MPJ range of motion. There is slight irritation on the medial aspect of the first metatarsal head on the bunion site from irritation shoe gear. There is no hypermobility present the first ray.Muscular strength 5/5 in all groups tested bilateral.  Gait: Unassisted, Nonantalgic.      Assessment & Plan:  61 year old female with right soft tissue mass  second plantar fibroma; HAV bilaterally -Treatment options discussed including all alternatives, risks, and complications -Etiology of symptoms were discussed -X-rays were obtained and reviewed with the patient.  -hyperkeratotic tissue debrided along the soft tissue mass. No underlying foreign body identified or puncture site. There is not painful. However given her history we will order an ultrasound to further evaluate the area. Discussed treatment options including verapamil cream and/or possible steroid injections. -Discussed conservative and surgical treatment options for the bunion. She states that she likely like to have bunion correction done the future. Discussed possible next 2 screw fixation including the surgical's postop of course. The meantime continue shoe gear modifications and offloading pads.  Celesta Gentile, DPM

## 2017-01-20 NOTE — Telephone Encounter (Addendum)
-----   Message from Trula Slade, DPM sent at 01/20/2017 10:04 AM EST ----- Can you order a diagnostic ultrasound of her right foot for plantar fibroma? Can this be done at the [facility]? 01/20/2017-Faxed orders to [facility].

## 2017-01-30 ENCOUNTER — Ambulatory Visit (HOSPITAL_BASED_OUTPATIENT_CLINIC_OR_DEPARTMENT_OTHER): Admission: RE | Admit: 2017-01-30 | Payer: BLUE CROSS/BLUE SHIELD | Source: Ambulatory Visit

## 2017-01-30 ENCOUNTER — Ambulatory Visit (HOSPITAL_BASED_OUTPATIENT_CLINIC_OR_DEPARTMENT_OTHER)
Admission: RE | Admit: 2017-01-30 | Discharge: 2017-01-30 | Disposition: A | Discharge status: Home / Self Care | Payer: BLUE CROSS/BLUE SHIELD | Source: Ambulatory Visit | Attending: Podiatry | Admitting: Podiatry

## 2017-01-30 DIAGNOSIS — M722 Plantar fascial fibromatosis: Secondary | ICD-10-CM | POA: Insufficient documentation

## 2017-02-02 MED ORDER — NONFORMULARY OR COMPOUNDED ITEM: 2 refills | Status: DC

## 2017-02-02 NOTE — Telephone Encounter (Addendum)
-----   Message from Trula Slade, DPM sent at 02/01/2017  8:44 AM EST ----- Ultrasound shows plantar fibroma. Please order the scar cream which includes vermapil through [facility] for her. Follow-up in 4-6 weeks. Thanks. 02/02/2017-I informed pt of Dr. Leigh Aurora orders and [facility] information. Faxed orders to [facility].

## 2017-02-25 ENCOUNTER — Other Ambulatory Visit: Payer: Self-pay | Admitting: *Deleted

## 2017-02-25 MED ORDER — RIVAROXABAN 20 MG PO TABS: 20.0000 mg | ORAL_TABLET | Freq: Every day | ORAL | 3 refills | Status: DC

## 2017-03-13 ENCOUNTER — Other Ambulatory Visit (HOSPITAL_BASED_OUTPATIENT_CLINIC_OR_DEPARTMENT_OTHER): Payer: BLUE CROSS/BLUE SHIELD

## 2017-03-13 ENCOUNTER — Ambulatory Visit (HOSPITAL_BASED_OUTPATIENT_CLINIC_OR_DEPARTMENT_OTHER): Payer: BLUE CROSS/BLUE SHIELD | Admitting: Hematology & Oncology

## 2017-03-13 ENCOUNTER — Telehealth: Payer: Self-pay | Admitting: *Deleted

## 2017-03-13 VITALS — BP 135/88 | HR 77 | Temp 98.3°F | Resp 16 | Wt 182.0 lb

## 2017-03-13 DIAGNOSIS — C186 Malignant neoplasm of descending colon: Secondary | ICD-10-CM | POA: Diagnosis not present

## 2017-03-13 DIAGNOSIS — R300 Dysuria: Secondary | ICD-10-CM

## 2017-03-13 DIAGNOSIS — D509 Iron deficiency anemia, unspecified: Secondary | ICD-10-CM | POA: Diagnosis not present

## 2017-03-13 DIAGNOSIS — Z85038 Personal history of other malignant neoplasm of large intestine: Secondary | ICD-10-CM

## 2017-03-13 DIAGNOSIS — M6282 Rhabdomyolysis: Secondary | ICD-10-CM

## 2017-03-13 DIAGNOSIS — Z7901 Long term (current) use of anticoagulants: Secondary | ICD-10-CM | POA: Diagnosis not present

## 2017-03-13 DIAGNOSIS — I82621 Acute embolism and thrombosis of deep veins of right upper extremity: Secondary | ICD-10-CM | POA: Insufficient documentation

## 2017-03-13 LAB — COMPREHENSIVE METABOLIC PANEL
ALBUMIN: 4 g/dL (ref 3.5–5.0)
ALK PHOS: 70 U/L (ref 40–150)
ALT: 18 U/L (ref 0–55)
AST: 16 U/L (ref 5–34)
Anion Gap: 8 mEq/L (ref 3–11)
BUN: 12.4 mg/dL (ref 7.0–26.0)
CALCIUM: 9.6 mg/dL (ref 8.4–10.4)
CHLORIDE: 106 meq/L (ref 98–109)
CO2: 27 mEq/L (ref 22–29)
Creatinine: 0.8 mg/dL (ref 0.6–1.1)
Glucose: 114 mg/dl (ref 70–140)
POTASSIUM: 3.9 meq/L (ref 3.5–5.1)
Sodium: 141 mEq/L (ref 136–145)
Total Bilirubin: 0.93 mg/dL (ref 0.20–1.20)
Total Protein: 6.9 g/dL (ref 6.4–8.3)

## 2017-03-13 LAB — CBC WITH DIFFERENTIAL (CANCER CENTER ONLY)
BASO#: 0 10*3/uL (ref 0.0–0.2)
BASO%: 0.3 % (ref 0.0–2.0)
EOS%: 1.7 % (ref 0.0–7.0)
Eosinophils Absolute: 0.1 10*3/uL (ref 0.0–0.5)
HCT: 39.5 % (ref 34.8–46.6)
HGB: 14.2 g/dL (ref 11.6–15.9)
LYMPH#: 1.6 10*3/uL (ref 0.9–3.3)
LYMPH%: 46 % (ref 14.0–48.0)
MCH: 31.6 pg (ref 26.0–34.0)
MCHC: 35.9 g/dL (ref 32.0–36.0)
MCV: 88 fL (ref 81–101)
MONO#: 0.3 10*3/uL (ref 0.1–0.9)
MONO%: 7.1 % (ref 0.0–13.0)
NEUT#: 1.6 10*3/uL (ref 1.5–6.5)
NEUT%: 44.9 % (ref 39.6–80.0)
PLATELETS: 116 10*3/uL — AB (ref 145–400)
RBC: 4.49 10*6/uL (ref 3.70–5.32)
RDW: 12.8 % (ref 11.1–15.7)
WBC: 3.5 10*3/uL — ABNORMAL LOW (ref 3.9–10.0)

## 2017-03-13 LAB — URINALYSIS, MICROSCOPIC (CHCC SATELLITE)
Bilirubin (Urine): NEGATIVE
GLUCOSE UR: NEGATIVE mg/dL
KETONES: NEGATIVE mg/dL
NITRITE: NEGATIVE
PH: 7 (ref 4.60–8.00)
Specific Gravity, Urine: 1.015 (ref 1.003–1.035)
UROBILINOGEN UR: 0.2 mg/dL (ref 0.2–1)

## 2017-03-13 LAB — CEA (IN HOUSE-CHCC): CEA (CHCC-In House): 4.87 ng/mL (ref 0.00–5.00)

## 2017-03-13 MED ORDER — SULFAMETHOXAZOLE-TRIMETHOPRIM 800-160 MG PO TABS: 1.0000 | ORAL_TABLET | Freq: Two times a day (BID) | ORAL | 0 refills | Status: DC

## 2017-03-13 NOTE — Telephone Encounter (Addendum)
Patient aware of results and new prescription. Pharmacy confirmed   ----- Message from [name], MD sent at 03/13/2017  2:11 PM EDT ----- Call - tumor marker for colon cancer is ok!!!  Dierdre Searles, MD  P Onc Nurse Hp        Call - may have a UTI. plese call in Bactrim DS 1 po BID x 5 days. Thanks!! Laurey Arrow

## 2017-03-13 NOTE — Progress Notes (Signed)
Hematology and Oncology Follow Up Visit  Mallory Morales [id number] [birth year] 61 y.o. 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 is back for followup. She actually was hospitalized over the Christmas and new year holiday. She had rhabdomyolysis. This probably was from being on Zocor.  While hospital, she also was found have a thrombus in the right arm. She now is on Xarelto. This is her second episode of thrombus. She had a postoperative thrombus back in 2013. At that time, her proteins S level was slightly depressed. I will have to recheck this.  She's complaining of some pain in the left flank. She has not noted any obvious dysuria. His been no urine discoloration. She's had no fever.  Her last CEA was 5.0.  We did check a follow-up CT scan on her back in September last year. This did not show any evidence of recurrent colon cancer.  She is still working. She is planning on going to [place] and May. She likes to travel. I want to make sure that she is healthy for traveling.  She's had no rashes.  Overall, her performance status is ECOG 1  Medications:  Current Outpatient Prescriptions:  .  hydrochlorothiazide (HYDRODIURIL) 25 MG tablet, 12.5 mg daily. Takes 1/2 tab in the morning, Disp: , Rfl:  .  NONFORMULARY OR COMPOUNDED ITEM, [facility]: Scar Cream - Verapamil 10%, Pentoxifylline 5%, apply to affected area 3-4 times daily., Disp: 120 each, Rfl: 2 .  rivaroxaban (XARELTO) 20 MG TABS tablet, Take 1 tablet (20 mg total) by mouth daily with supper., Disp: 30 tablet, Rfl: 3  Allergies:  Allergies  Allergen Reactions  . Simvastatin     Severe rhabdomyolysis.  . Strawberry Extract Swelling      Face only. Breathing not affected.    Past Medical History, Surgical history, Social history, and Family History were reviewed and updated.  Review of Systems: As above  Physical Exam:  weight is 182 lb (82.6 kg). Her oral temperature is 98.3 F (36.8 C). Her blood pressure is 135/88 and her pulse is 77. Her respiration is 16 and oxygen saturation is 99%.   Well-developed and well-nourished African American female. Her abdominal exam shows a soft abdomen she has of abdominal wounds are well-healed. She has some keloid formation. She has no fluid wave. There is some slight guarding. She has no obvious liver or spleen tip. Lungs are clear. Cardiac exam regular rate and rhythm. She has a 1/6 systolic murmur. Extremities shows no clubbing, cyanosis or edema. Back exam is shows no tenderness over the spine. No spasms noted in the paravertebral muscles. Skin exam shows no rashes, ecchymosis or petechia. Head and neck exam shows no ocular or oral lesions. She has no palpable cervical or supraclavicular lymph nodes.  Lab Results  Component Value Date   WBC 3.5 (L) 03/13/2017   HGB 14.2 03/13/2017   HCT 39.5 03/13/2017   MCV 88 03/13/2017   PLT 116 (L) 03/13/2017     Chemistry      Component Value Date/Time   NA 138 12/23/2016 0355   NA 143 09/12/2016 0751   K 4.1 12/23/2016 0355   K  3.7 09/12/2016 0751   CL 108 12/23/2016 0355   CL 104 03/01/2015 1026   CO2 21 (L) 12/23/2016 0355   CO2 27 09/12/2016 0751   BUN 8 12/23/2016 0355   BUN 11.9 09/12/2016 0751   CREATININE 0.58 12/23/2016 0355   CREATININE 0.9 09/12/2016 0751      Component Value Date/Time   CALCIUM 8.6 (L) 12/23/2016 0355   CALCIUM 9.6 09/12/2016 0751   ALKPHOS 51 12/23/2016 0355   ALKPHOS 73 09/12/2016 0751   AST 130 (H) 12/23/2016 0355   AST 17 09/12/2016 0751   ALT 115 (H) 12/23/2016 0355   ALT 18 09/12/2016 0751   BILITOT 0.7 12/23/2016 0355   BILITOT 1.15 09/12/2016 0751         Impression and  Plan: Mallory Morales is 61 year-old Afro-American female. She has a history of stage II adenocarcinoma of the sigmoid colon. She had an Oncotype score of 20. She had 36 lymph nodes that were negative. She has surgery back in January 2013. She did not require any adjuvant therapy.  I'm absolutely shocked that she had rhabdomyolysis back in December. This had to be from her being on Zocor. She now is off Zocor.  We will check a urinalysis on her. I am not sure why she's having this left flank pain.  She is very worried about the rhabdomyolysis coming back. I will go ahead and check an aldolase on her and also check her creatinine kinase.  We will see what her CEA level is. I would not think that recurrent colon cancer is a problem. I think the fact that she had 36 negative lymph nodes is clearly a good indicator.  When I see her back, I would like to get a Doppler of her right arm to see how the blood clot has responded. I think that she, for some reason, will need long-term anticoagulation, just at a low-dose.  I probably will plan to get her back in 3 months for follow-up. I think we have to have close follow-up for right now.   Mallory Napoleon, MD 3/23/20188:38 AM

## 2017-03-14 LAB — ALDOLASE: Aldolase: 4.1 U/L (ref 3.3–10.3)

## 2017-03-14 LAB — CK TOTAL AND CKMB (NOT AT ARMC)
CK-MB Index: 3.2 ng/mL (ref 0.0–5.3)
Creatine Kinase Total: 130 U/L (ref 24–173)

## 2017-03-14 LAB — CEA: CEA1: 5.1 ng/mL — AB (ref 0.0–4.7)

## 2017-03-16 ENCOUNTER — Encounter: Payer: Self-pay | Admitting: *Deleted

## 2017-04-08 ENCOUNTER — Ambulatory Visit (HOSPITAL_BASED_OUTPATIENT_CLINIC_OR_DEPARTMENT_OTHER)
Admission: RE | Admit: 2017-04-08 | Discharge: 2017-04-08 | Disposition: A | Discharge status: Home / Self Care | Payer: BLUE CROSS/BLUE SHIELD | Source: Ambulatory Visit | Attending: Hematology & Oncology | Admitting: Hematology & Oncology

## 2017-04-08 DIAGNOSIS — M6282 Rhabdomyolysis: Secondary | ICD-10-CM | POA: Diagnosis present

## 2017-04-08 DIAGNOSIS — R300 Dysuria: Secondary | ICD-10-CM

## 2017-04-08 DIAGNOSIS — I82621 Acute embolism and thrombosis of deep veins of right upper extremity: Secondary | ICD-10-CM | POA: Diagnosis present

## 2017-04-08 DIAGNOSIS — Z86718 Personal history of other venous thrombosis and embolism: Secondary | ICD-10-CM | POA: Insufficient documentation

## 2017-04-15 DIAGNOSIS — R42 Dizziness and giddiness: Secondary | ICD-10-CM | POA: Diagnosis not present

## 2017-06-19 ENCOUNTER — Ambulatory Visit (HOSPITAL_BASED_OUTPATIENT_CLINIC_OR_DEPARTMENT_OTHER): Payer: BLUE CROSS/BLUE SHIELD | Admitting: Hematology & Oncology

## 2017-06-19 ENCOUNTER — Ambulatory Visit: Payer: BLUE CROSS/BLUE SHIELD | Admitting: Hematology & Oncology

## 2017-06-19 ENCOUNTER — Other Ambulatory Visit (HOSPITAL_BASED_OUTPATIENT_CLINIC_OR_DEPARTMENT_OTHER): Payer: BLUE CROSS/BLUE SHIELD

## 2017-06-19 ENCOUNTER — Other Ambulatory Visit: Payer: BLUE CROSS/BLUE SHIELD

## 2017-06-19 VITALS — BP 141/82 | HR 81 | Temp 98.3°F | Resp 18 | Wt 184.0 lb

## 2017-06-19 DIAGNOSIS — R300 Dysuria: Secondary | ICD-10-CM | POA: Diagnosis not present

## 2017-06-19 DIAGNOSIS — Z86718 Personal history of other venous thrombosis and embolism: Secondary | ICD-10-CM | POA: Diagnosis not present

## 2017-06-19 DIAGNOSIS — M6282 Rhabdomyolysis: Secondary | ICD-10-CM | POA: Diagnosis not present

## 2017-06-19 DIAGNOSIS — Z85038 Personal history of other malignant neoplasm of large intestine: Secondary | ICD-10-CM | POA: Diagnosis not present

## 2017-06-19 DIAGNOSIS — C186 Malignant neoplasm of descending colon: Secondary | ICD-10-CM

## 2017-06-19 DIAGNOSIS — I82413 Acute embolism and thrombosis of femoral vein, bilateral: Secondary | ICD-10-CM

## 2017-06-19 DIAGNOSIS — I82621 Acute embolism and thrombosis of deep veins of right upper extremity: Secondary | ICD-10-CM | POA: Diagnosis not present

## 2017-06-19 DIAGNOSIS — D501 Sideropenic dysphagia: Secondary | ICD-10-CM

## 2017-06-19 LAB — CMP (CANCER CENTER ONLY)
ALT(SGPT): 23 U/L (ref 10–47)
AST: 24 U/L (ref 11–38)
Albumin: 3.9 g/dL (ref 3.3–5.5)
Alkaline Phosphatase: 64 U/L (ref 26–84)
BUN: 12 mg/dL (ref 7–22)
CALCIUM: 9.4 mg/dL (ref 8.0–10.3)
CO2: 30 meq/L (ref 18–33)
Chloride: 104 mEq/L (ref 98–108)
Creat: 0.8 mg/dl (ref 0.6–1.2)
GLUCOSE: 108 mg/dL (ref 73–118)
POTASSIUM: 3.5 meq/L (ref 3.3–4.7)
Sodium: 139 mEq/L (ref 128–145)
Total Bilirubin: 1.2 mg/dl (ref 0.20–1.60)
Total Protein: 7.1 g/dL (ref 6.4–8.1)

## 2017-06-19 LAB — CBC WITH DIFFERENTIAL (CANCER CENTER ONLY)
BASO#: 0 10*3/uL (ref 0.0–0.2)
BASO%: 0.5 % (ref 0.0–2.0)
EOS%: 2 % (ref 0.0–7.0)
Eosinophils Absolute: 0.1 10*3/uL (ref 0.0–0.5)
HEMATOCRIT: 38 % (ref 34.8–46.6)
HGB: 13.8 g/dL (ref 11.6–15.9)
LYMPH#: 1.8 10*3/uL (ref 0.9–3.3)
LYMPH%: 45.3 % (ref 14.0–48.0)
MCH: 32.2 pg (ref 26.0–34.0)
MCHC: 36.3 g/dL — AB (ref 32.0–36.0)
MCV: 89 fL (ref 81–101)
MONO#: 0.3 10*3/uL (ref 0.1–0.9)
MONO%: 6.8 % (ref 0.0–13.0)
NEUT#: 1.8 10*3/uL (ref 1.5–6.5)
NEUT%: 45.4 % (ref 39.6–80.0)
PLATELETS: 125 10*3/uL — AB (ref 145–400)
RBC: 4.29 10*6/uL (ref 3.70–5.32)
RDW: 12.4 % (ref 11.1–15.7)
WBC: 4 10*3/uL (ref 3.9–10.0)

## 2017-06-19 LAB — CEA (IN HOUSE-CHCC): CEA (CHCC-IN HOUSE): 4.72 ng/mL (ref 0.00–5.00)

## 2017-06-19 MED ORDER — RIVAROXABAN 10 MG PO TABS: 10.0000 mg | ORAL_TABLET | Freq: Every day | ORAL | 12 refills | Status: DC

## 2017-06-19 NOTE — Progress Notes (Signed)
Hematology and Oncology Follow Up Visit  Mallory Morales [id number] 1956-02-03 61 y.o. 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     Interim History:  [name] is back for followup. She looks fantastic. She had a [place] cruise in May. This was 3 weeks. She had a great time. She ate a lot of good food. She went to see a lot of wonderful places.  She has recovered from the rhabdomyolysis. She still has some pain in the right flank. I told her she may always have something like this.  She is on Xarelto. We will decrease her dose down to 10 mg daily.   Her last CEA was 5.1. This is holding steady.  She's had no change in bowel or bladder habits. There's been no hematuria.  She's had no fever. There's been no cough. She did have some bronchitis on her [place] cruise.  Overall, her performance status is ECOG 1  Medications:  Current Outpatient Prescriptions:  .  hydrochlorothiazide (HYDRODIURIL) 25 MG tablet, 12.5 mg daily. Takes 1/2 tab in the morning, Disp: , Rfl:  .  NONFORMULARY OR COMPOUNDED ITEM, [facility]: Scar Cream - Verapamil 10%, Pentoxifylline 5%, apply to affected area 3-4 times daily., Disp: 120 each, Rfl: 2 .  rivaroxaban (XARELTO) 20 MG TABS tablet, Take 1 tablet (20 mg total) by mouth daily with supper., Disp: 30 tablet, Rfl: 3  Allergies:  Allergies  Allergen Reactions  . Simvastatin     Severe rhabdomyolysis.  . Strawberry Extract Swelling    Face only. Breathing not affected.    Past Medical History, Surgical history, Social history, and Family History were reviewed and updated.  Review of Systems: As above  Physical Exam:  weight is 184 lb  (83.5 kg). Her oral temperature is 98.3 F (36.8 C). Her blood pressure is 141/82 (abnormal) and her pulse is 81. Her respiration is 18 and oxygen saturation is 100%.   Well-developed and well-nourished African American Morales. Her abdominal exam shows a soft abdomen she has of abdominal wounds are well-healed. She has some keloid formation. She has no fluid wave. There is some slight guarding. She has no obvious liver or spleen tip. Lungs are clear. Cardiac exam regular rate and rhythm. She has a 1/6 systolic murmur. Extremities shows no clubbing, cyanosis or edema. Back exam is shows no tenderness over the spine. No spasms noted in the paravertebral muscles. Skin exam shows no rashes, ecchymosis or petechia. Head and neck exam shows no ocular or oral lesions. She has no palpable cervical or supraclavicular lymph nodes.  Lab Results  Component Value Date   WBC 4.0 06/19/2017   HGB 13.8 06/19/2017   HCT 38.0 06/19/2017   MCV 89 06/19/2017   PLT 125 (L) 06/19/2017     Chemistry      Component Value Date/Time   NA 139 06/19/2017 0955   NA 141 03/13/2017 0807   K 3.5 06/19/2017 0955   K 3.9 03/13/2017 0807   CL 104 06/19/2017 0955   CO2 30 06/19/2017 0955   CO2 27 03/13/2017 0807   BUN 12 06/19/2017 0955   BUN 12.4 03/13/2017 0807  CREATININE 0.8 06/19/2017 0955   CREATININE 0.8 03/13/2017 0807      Component Value Date/Time   CALCIUM 9.4 06/19/2017 0955   CALCIUM 9.6 03/13/2017 0807   ALKPHOS 64 06/19/2017 0955   ALKPHOS 70 03/13/2017 0807   AST 24 06/19/2017 0955   AST 16 03/13/2017 0807   ALT 23 06/19/2017 0955   ALT 18 03/13/2017 0807   BILITOT 1.20 06/19/2017 0955   BILITOT 0.93 03/13/2017 0807         Impression and Plan: Mallory Morales is 61 year-old Mallory Morales. She has a history of stage II adenocarcinoma of the sigmoid colon. She had an Oncotype score of 20. She had 36 lymph nodes that were negative. She has surgery back in January 2013. She did not require  any adjuvant therapy.  She's doing quite well. The Doppler thankfully, did not show any residual thrombus in the deep veins. There is still a little swelling in the right arm. She may always have this. We may have to consider a sleeve.  I will like to see her back in 4 months. I think this would be reasonable.  I told her to make sure that she brings back pictures of her [place] cruise that she went on in May.  [name], MD 6/29/201810:31 AM

## 2017-06-20 LAB — PROTEIN S ACTIVITY: PROTEIN S ACTIVITY: 135 % (ref 63–140)

## 2017-06-20 LAB — PROTEIN S, ANTIGEN, FREE: PROTEIN S AG FREE: 107 % (ref 57–157)

## 2017-06-20 LAB — PROTEIN S, TOTAL: PROTEIN S AG TOTAL: 89 % (ref 60–150)

## 2017-06-20 LAB — D-DIMER, QUANTITATIVE (NOT AT ARMC)

## 2017-07-08 DIAGNOSIS — Z1231 Encounter for screening mammogram for malignant neoplasm of breast: Secondary | ICD-10-CM | POA: Diagnosis not present

## 2017-07-27 ENCOUNTER — Encounter: Payer: Self-pay | Admitting: Hematology & Oncology

## 2017-07-29 DIAGNOSIS — I10 Essential (primary) hypertension: Secondary | ICD-10-CM | POA: Diagnosis not present

## 2017-07-29 DIAGNOSIS — Z85038 Personal history of other malignant neoplasm of large intestine: Secondary | ICD-10-CM | POA: Diagnosis not present

## 2017-07-29 DIAGNOSIS — R8290 Unspecified abnormal findings in urine: Secondary | ICD-10-CM | POA: Diagnosis not present

## 2017-07-29 DIAGNOSIS — M6282 Rhabdomyolysis: Secondary | ICD-10-CM | POA: Diagnosis not present

## 2017-07-29 DIAGNOSIS — E7800 Pure hypercholesterolemia, unspecified: Secondary | ICD-10-CM | POA: Diagnosis not present

## 2017-07-29 DIAGNOSIS — R7303 Prediabetes: Secondary | ICD-10-CM | POA: Diagnosis not present

## 2017-09-22 DIAGNOSIS — Z79899 Other long term (current) drug therapy: Secondary | ICD-10-CM | POA: Diagnosis not present

## 2017-09-22 DIAGNOSIS — E7800 Pure hypercholesterolemia, unspecified: Secondary | ICD-10-CM | POA: Diagnosis not present

## 2017-09-28 DIAGNOSIS — R1012 Left upper quadrant pain: Secondary | ICD-10-CM | POA: Diagnosis not present

## 2017-09-28 DIAGNOSIS — M940 Chondrocostal junction syndrome [Tietze]: Secondary | ICD-10-CM | POA: Diagnosis not present

## 2017-10-16 ENCOUNTER — Ambulatory Visit (HOSPITAL_BASED_OUTPATIENT_CLINIC_OR_DEPARTMENT_OTHER): Payer: BLUE CROSS/BLUE SHIELD | Admitting: Family

## 2017-10-16 ENCOUNTER — Other Ambulatory Visit (HOSPITAL_BASED_OUTPATIENT_CLINIC_OR_DEPARTMENT_OTHER): Payer: BLUE CROSS/BLUE SHIELD

## 2017-10-16 VITALS — BP 138/89 | HR 80 | Temp 98.1°F | Resp 18 | Wt 184.0 lb

## 2017-10-16 DIAGNOSIS — I82413 Acute embolism and thrombosis of femoral vein, bilateral: Secondary | ICD-10-CM

## 2017-10-16 DIAGNOSIS — Z86718 Personal history of other venous thrombosis and embolism: Secondary | ICD-10-CM

## 2017-10-16 DIAGNOSIS — Z7901 Long term (current) use of anticoagulants: Secondary | ICD-10-CM | POA: Diagnosis not present

## 2017-10-16 DIAGNOSIS — Z862 Personal history of diseases of the blood and blood-forming organs and certain disorders involving the immune mechanism: Secondary | ICD-10-CM

## 2017-10-16 DIAGNOSIS — C186 Malignant neoplasm of descending colon: Secondary | ICD-10-CM

## 2017-10-16 DIAGNOSIS — D501 Sideropenic dysphagia: Secondary | ICD-10-CM

## 2017-10-16 DIAGNOSIS — Z85038 Personal history of other malignant neoplasm of large intestine: Secondary | ICD-10-CM

## 2017-10-16 DIAGNOSIS — D509 Iron deficiency anemia, unspecified: Secondary | ICD-10-CM

## 2017-10-16 LAB — FERRITIN: Ferritin: 349 ng/ml — ABNORMAL HIGH (ref 9–269)

## 2017-10-16 LAB — CMP (CANCER CENTER ONLY)
ALT(SGPT): 28 U/L (ref 10–47)
AST: 24 U/L (ref 11–38)
Albumin: 3.8 g/dL (ref 3.3–5.5)
Alkaline Phosphatase: 72 U/L (ref 26–84)
BUN, Bld: 10 mg/dL (ref 7–22)
CO2: 29 meq/L (ref 18–33)
Calcium: 9.3 mg/dL (ref 8.0–10.3)
Chloride: 106 meq/L (ref 98–108)
Creat: 1 mg/dL (ref 0.6–1.2)
Glucose, Bld: 116 mg/dL (ref 73–118)
Potassium: 3.8 meq/L (ref 3.3–4.7)
Sodium: 146 meq/L — ABNORMAL HIGH (ref 128–145)
Total Bilirubin: 0.8 mg/dL (ref 0.20–1.60)
Total Protein: 6.8 g/dL (ref 6.4–8.1)

## 2017-10-16 LAB — CBC WITH DIFFERENTIAL (CANCER CENTER ONLY)
BASO#: 0 10*3/uL (ref 0.0–0.2)
BASO%: 0.5 % (ref 0.0–2.0)
EOS%: 2.6 % (ref 0.0–7.0)
Eosinophils Absolute: 0.1 10*3/uL (ref 0.0–0.5)
HCT: 38.6 % (ref 34.8–46.6)
HGB: 14.1 g/dL (ref 11.6–15.9)
LYMPH#: 1.7 10*3/uL (ref 0.9–3.3)
LYMPH%: 39.9 % (ref 14.0–48.0)
MCH: 32 pg (ref 26.0–34.0)
MCHC: 36.5 g/dL — ABNORMAL HIGH (ref 32.0–36.0)
MCV: 88 fL (ref 81–101)
MONO#: 0.3 10*3/uL (ref 0.1–0.9)
MONO%: 6.7 % (ref 0.0–13.0)
NEUT#: 2.1 10*3/uL (ref 1.5–6.5)
NEUT%: 50.3 % (ref 39.6–80.0)
Platelets: 121 10*3/uL — ABNORMAL LOW (ref 145–400)
RBC: 4.41 10*6/uL (ref 3.70–5.32)
RDW: 12.3 % (ref 11.1–15.7)
WBC: 4.2 10*3/uL (ref 3.9–10.0)

## 2017-10-16 LAB — CEA (IN HOUSE-CHCC): CEA (CHCC-In House): 4.97 ng/mL (ref 0.00–5.00)

## 2017-10-16 LAB — IRON AND TIBC
%SAT: 22 % (ref 21–57)
IRON: 74 ug/dL (ref 41–142)
TIBC: 331 ug/dL (ref 236–444)
UIBC: 258 ug/dL (ref 120–384)

## 2017-10-16 NOTE — Progress Notes (Signed)
Hematology and Oncology Follow Up Visit  Mallory Morales [id number] 12-May-1956 61 y.o. 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   Interim History:  Mallory Morales is here today for follow-up. She is doing well and verbalized that she is taking her Xarelto 10 mg PO daily as prescribed.  She is staying busy with work and having fun traveling. She is also hoping to find her another Mallory Morales since her beloved Mallory Morales passed last year. I gave her the names of some rescues that deal with Corg[employer]. She is excited to start her search.  Her counts today are stable. No anemia at this time. CEA and iron studies are pending.   No fever, chills, n/v, cough, rash, dizziness, SOB, chest pain, palpitations, abdominal pain or changes in bowel or bladder habits. She has occasional puffiness in her ankles when she is on her feet for an extended period of time. This resolves when she props up her feet.  She states that she completed a medrol dose pack for for what was felt to be a bruised rib. She does bruise easily on the xarelto but not in excess. She denies any episodes of bleeding, no petechiae.  No numbness or tingling in her extremities.  She has maintained a good appetite and is staying well hydrated. Her weight is stable.   ECOG Performance Status: 0 - Asymptomatic  Medications:  Allergies as of 10/16/2017      Reactions   Simvastatin    Severe rhabdomyolysis.   Strawberry Extract Swelling   Face only. Breathing not affected.      Medication List       Accurate as of 10/16/17 11:11 AM. Always use your most recent med list.          hydrochlorothiazide 25 MG tablet Commonly known as:  HYDRODIURIL 12.5 mg daily. Takes  1/2 tab in the morning   NONFORMULARY OR COMPOUNDED [place]: Scar Cream - Verapamil 10%, Pentoxifylline 5%, apply to affected area 3-4 times daily.   rivaroxaban 10 MG Tabs tablet Commonly known as:  XARELTO Take 1 tablet (10 mg total) by mouth daily with supper.   rosuvastatin 10 MG tablet Commonly known as:  CRESTOR       Allergies:  Allergies  Allergen Reactions  . Simvastatin     Severe rhabdomyolysis.  . Strawberry Extract Swelling    Face only. Breathing not affected.    Past Medical History, Surgical history, Social history, and Family History were reviewed and updated.  Review of Systems: All other 10 point review of systems is negative.   Physical Exam:  weight is 184 lb (83.5 kg). Her oral temperature is 98.1 F (36.7 C). Her blood pressure is 138/89 and her pulse is 80. Her respiration is 18 and oxygen saturation is 99%.   Wt Readings from Last 3 Encounters:  10/16/17 184 lb (83.5 kg)  06/19/17 184 lb (83.5 kg)  03/13/17 182 lb (82.6 kg)    Ocular: Sclerae unicteric, pupils equal, round and reactive to light Ear-nose-throat: Oropharynx clear, dentition fair Lymphatic: No cervical, supraclavicular or axillary adenopathy Lungs no rales or rhonchi, good excursion bilaterally Heart regular rate and rhythm, no murmur appreciated Abd soft, nontender,  positive bowel sounds, no liver or spleen tip palpated on exam, no fluid wave  MSK no focal spinal tenderness, no joint edema Neuro: non-focal, well-oriented, appropriate affect Breasts: Deferred   Lab Results  Component Value Date   WBC 4.2 10/16/2017   HGB 14.1 10/16/2017   HCT 38.6 10/16/2017   MCV 88 10/16/2017   PLT 121 (L) 10/16/2017   Lab Results  Component Value Date   FERRITIN 289 (H) 09/12/2016   IRON 105 09/12/2016   TIBC 339 09/12/2016   UIBC 234 09/12/2016   IRONPCTSAT 31 09/12/2016   Lab Results  Component Value Date   RETICCTPCT 1.2 03/01/2015   RBC 4.41 10/16/2017    RETICCTABS 52.1 03/01/2015   No results found for: KPAFRELGTCHN, LAMBDASER, KAPLAMBRATIO No results found for: IGGSERUM, IGA, IGMSERUM No results found for: Kathrynn Ducking, MSPIKE, SPEI   Chemistry      Component Value Date/Time   NA 146 (H) 10/16/2017 0804   NA 141 03/13/2017 0807   K 3.8 10/16/2017 0804   K 3.9 03/13/2017 0807   CL 106 10/16/2017 0804   CO2 29 10/16/2017 0804   CO2 27 03/13/2017 0807   BUN 10 10/16/2017 0804   BUN 12.4 03/13/2017 0807   CREATININE 1.0 10/16/2017 0804   CREATININE 0.8 03/13/2017 0807      Component Value Date/Time   CALCIUM 9.3 10/16/2017 0804   CALCIUM 9.6 03/13/2017 0807   ALKPHOS 72 10/16/2017 0804   ALKPHOS 70 03/13/2017 0807   AST 24 10/16/2017 0804   AST 16 03/13/2017 0807   ALT 28 10/16/2017 0804   ALT 18 03/13/2017 0807   BILITOT 0.80 10/16/2017 0804   BILITOT 0.93 03/13/2017 0807      Impression and Plan: Mallory Morales is a lovely 61 yo African American female with history of stage II adenocarcinoma of the sigmoid colon, oncotype score of 20, 36 lymph nodes were negative. She had her surgery in January of 2013 and did not require adjuvant treatment.  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 She is doing well on Xarelto 10 mg PO daily and has had no evidence of recurrence.  CEA in June was 4.72. Today's result is pending.  We will see what her iron studies show and bring her back in for an infusion if needed.  We will plan to see her back in another 4 months for follow-up and repeat lab work Hopefully she will have a new Corgi by then!  She will contact our office with any questions or concerns. We can certainly see her sooner if need be.   Eliezer Bottom, NP 10/26/201811:11 AM

## 2017-10-22 ENCOUNTER — Encounter: Payer: Self-pay | Admitting: Hematology & Oncology

## 2018-01-20 IMAGING — US US ABDOMEN LIMITED
1 series · 14 of 25 positions shown · non-contrast
Comparison: CT abdomen and pelvis of 09/13/2016

CLINICAL DATA: Elevated liver function tests, hypertension

EXAM:
US ABDOMEN LIMITED - RIGHT UPPER QUADRANT

[Series 1: us abdomen limited · 0.19mm/px · 14 of 37 slices shown]
[im 1/37]
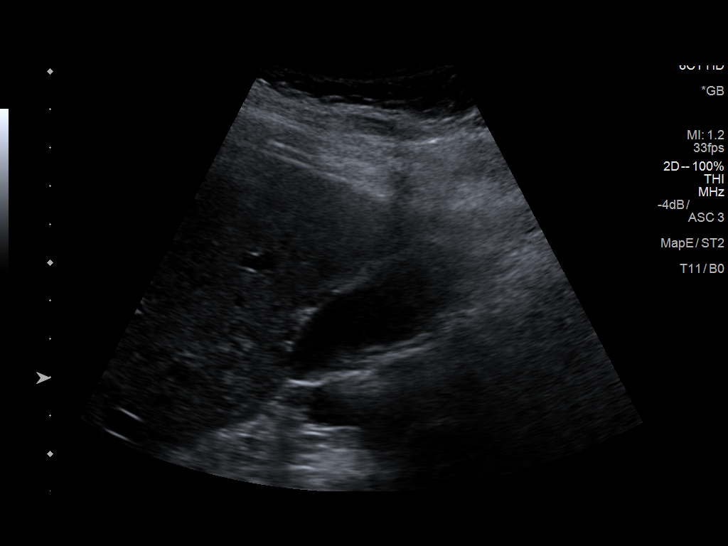
[im 4/37]
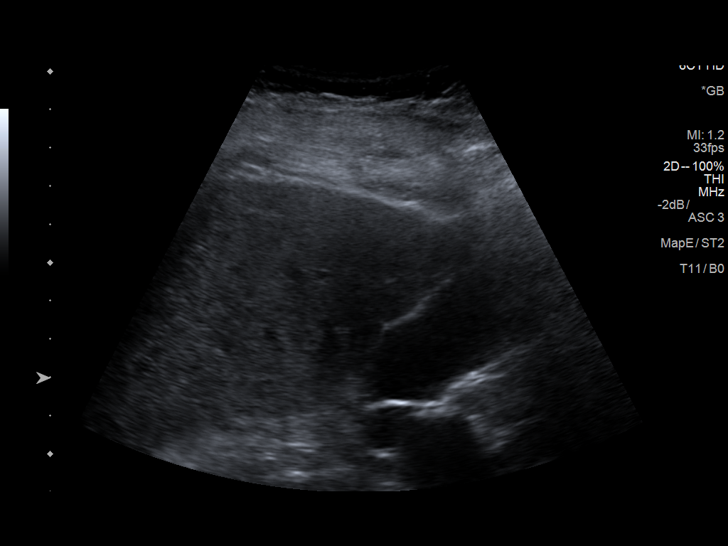
[im 7/37]
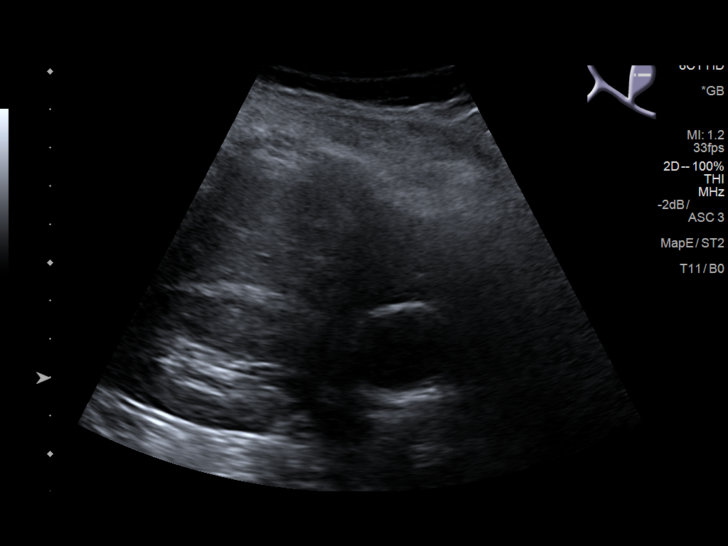
[im 10/37]
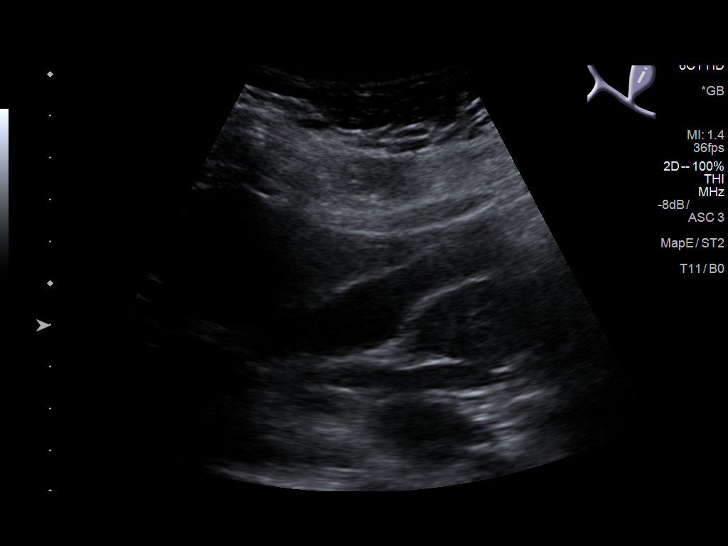
[im 13/37]
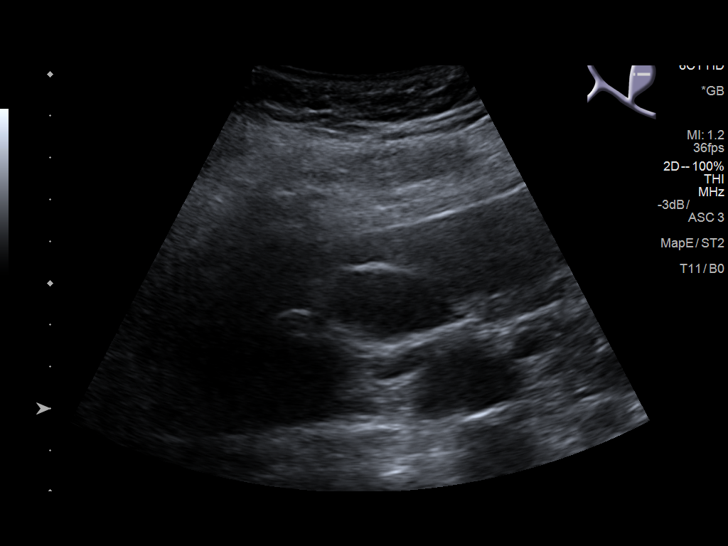
[im 14/37]
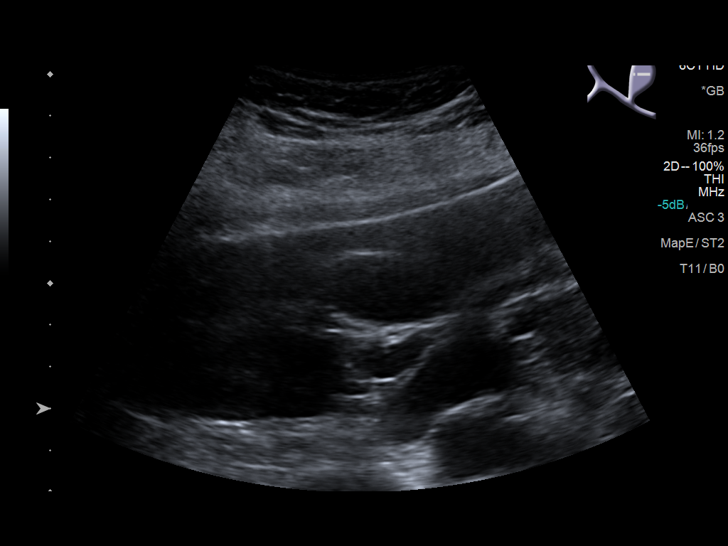
[im 17/37]
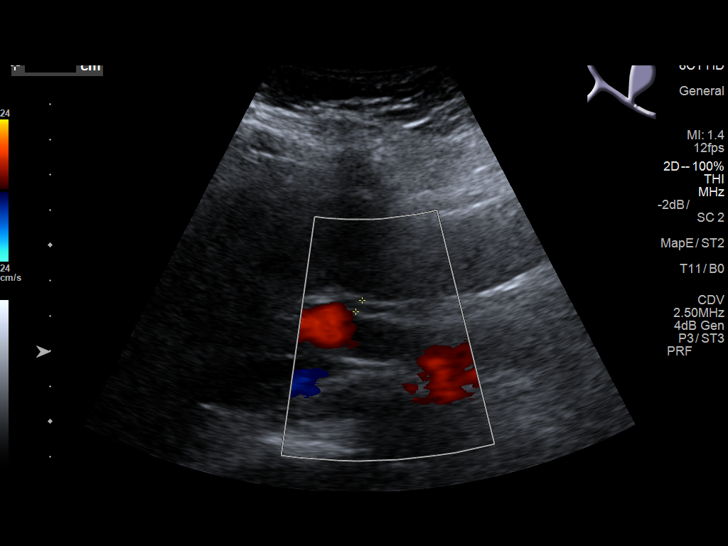
[im 20/37]
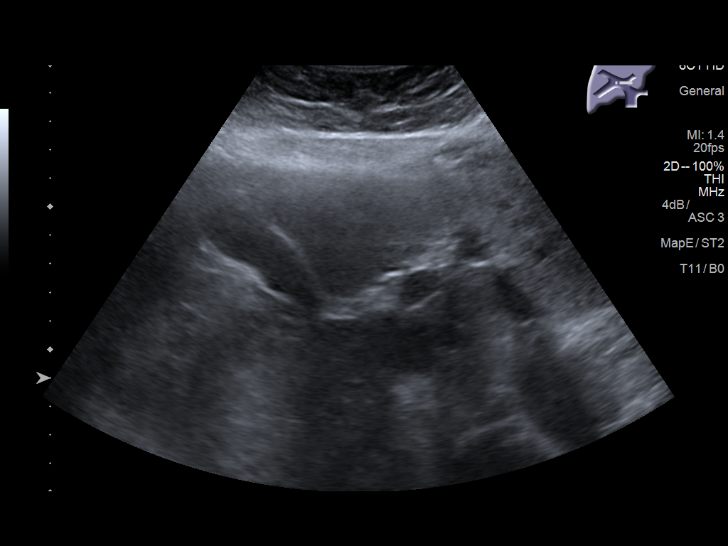
[im 23/37]
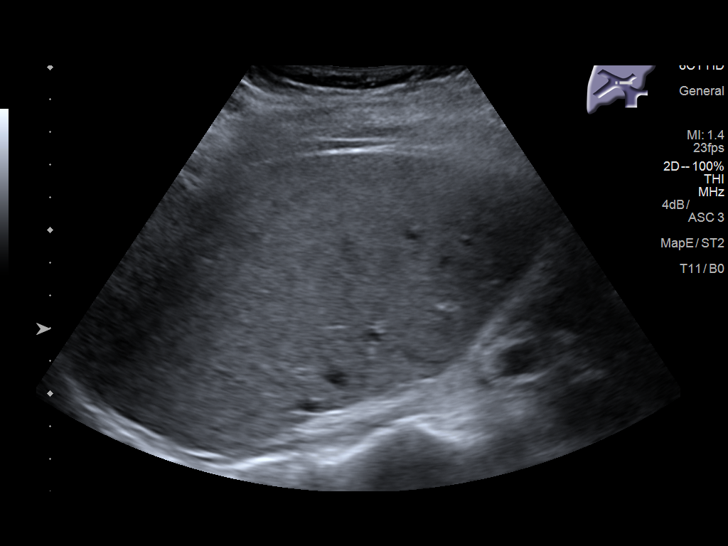
[im 25/37]
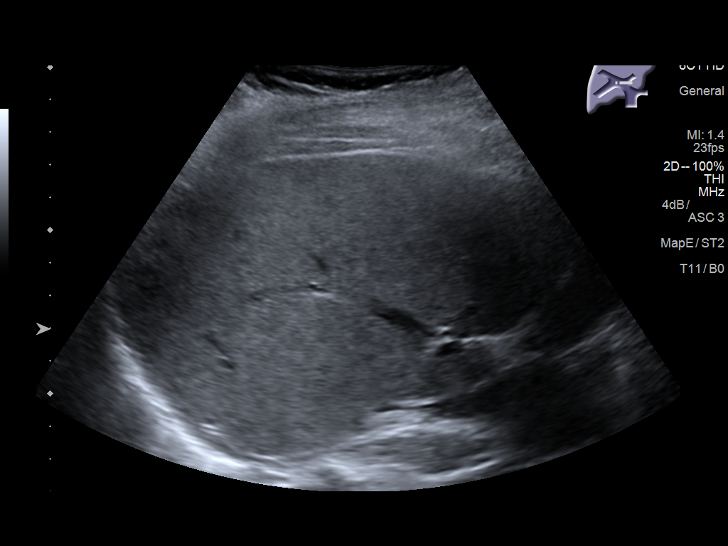
[im 28/37]
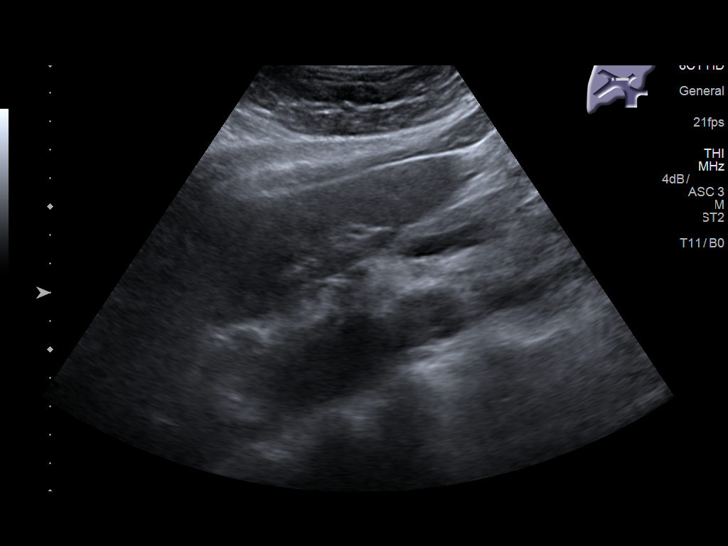
[im 31/37]
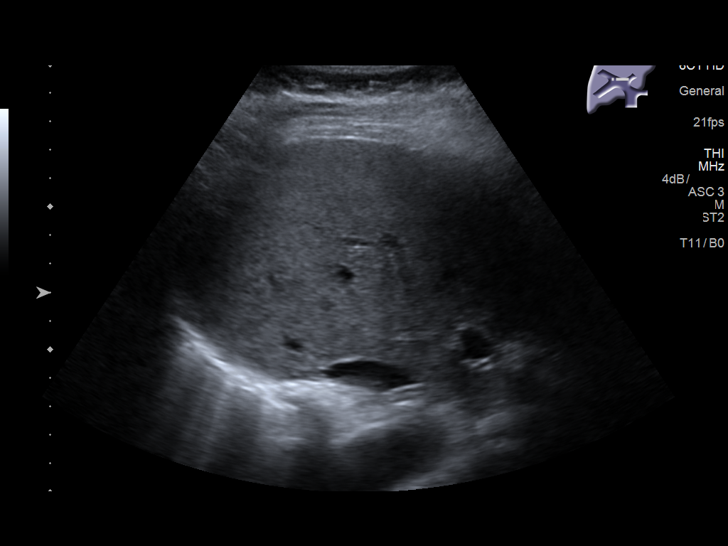
[im 34/37]
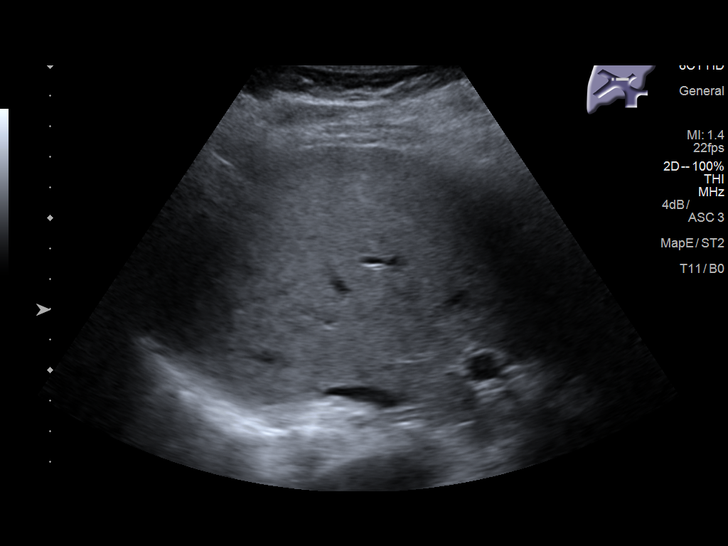
[im 37/37]
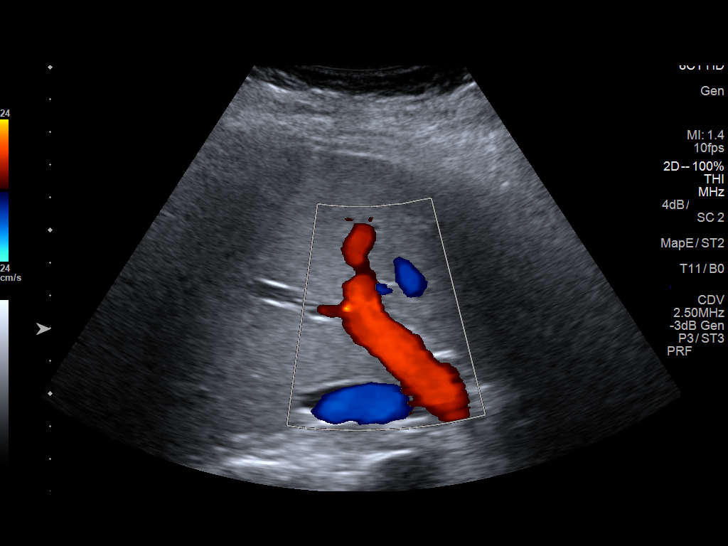

[14 of 25 positions shown; findings below may reference images not displayed]

FINDINGS: Gallbladder:

The gallbladder is visualized and no gallstones are noted. There is
no pain over the gallbladder with compression.

Common bile duct:

Diameter: The common bile duct is normal measuring 3.7 mm in
diameter.

Liver:

The liver is somewhat inhomogeneous but no focal abnormality is
noted.
IMPRESSION: Negative limited ultrasound of the right upper quadrant.

## 2018-01-29 DIAGNOSIS — R7303 Prediabetes: Secondary | ICD-10-CM | POA: Diagnosis not present

## 2018-01-29 DIAGNOSIS — I10 Essential (primary) hypertension: Secondary | ICD-10-CM | POA: Diagnosis not present

## 2018-01-29 DIAGNOSIS — Z85038 Personal history of other malignant neoplasm of large intestine: Secondary | ICD-10-CM | POA: Diagnosis not present

## 2018-01-29 DIAGNOSIS — E7800 Pure hypercholesterolemia, unspecified: Secondary | ICD-10-CM | POA: Diagnosis not present

## 2018-02-12 ENCOUNTER — Encounter: Payer: Self-pay | Admitting: Hematology & Oncology

## 2018-02-12 ENCOUNTER — Inpatient Hospital Stay: Payer: BLUE CROSS/BLUE SHIELD

## 2018-02-12 ENCOUNTER — Inpatient Hospital Stay: Payer: BLUE CROSS/BLUE SHIELD | Attending: Hematology & Oncology | Admitting: Hematology & Oncology

## 2018-02-12 ENCOUNTER — Other Ambulatory Visit: Payer: Self-pay

## 2018-02-12 VITALS — BP 131/87 | HR 82 | Temp 98.4°F | Resp 16 | Wt 185.0 lb

## 2018-02-12 DIAGNOSIS — D509 Iron deficiency anemia, unspecified: Secondary | ICD-10-CM

## 2018-02-12 DIAGNOSIS — C186 Malignant neoplasm of descending colon: Secondary | ICD-10-CM

## 2018-02-12 DIAGNOSIS — Z85038 Personal history of other malignant neoplasm of large intestine: Secondary | ICD-10-CM | POA: Diagnosis not present

## 2018-02-12 DIAGNOSIS — Z86718 Personal history of other venous thrombosis and embolism: Secondary | ICD-10-CM

## 2018-02-12 DIAGNOSIS — Z7901 Long term (current) use of anticoagulants: Secondary | ICD-10-CM | POA: Diagnosis not present

## 2018-02-12 DIAGNOSIS — Z79899 Other long term (current) drug therapy: Secondary | ICD-10-CM | POA: Diagnosis not present

## 2018-02-12 DIAGNOSIS — D500 Iron deficiency anemia secondary to blood loss (chronic): Secondary | ICD-10-CM

## 2018-02-12 LAB — CMP (CANCER CENTER ONLY)
ALBUMIN: 4.2 g/dL (ref 3.5–5.0)
ALK PHOS: 78 U/L (ref 40–150)
ALT: 16 U/L (ref 0–55)
ANION GAP: 10 (ref 3–11)
AST: 17 U/L (ref 5–34)
BILIRUBIN TOTAL: 0.7 mg/dL (ref 0.2–1.2)
BUN: 12 mg/dL (ref 7–26)
CO2: 26 mmol/L (ref 22–29)
CREATININE: 0.85 mg/dL (ref 0.60–1.10)
Calcium: 9.8 mg/dL (ref 8.4–10.4)
Chloride: 106 mmol/L (ref 98–109)
GFR, Est AFR Am: >60 mL/min (ref >60)
GFR, Estimated: >60 mL/min (ref >60)
GLUCOSE: 113 mg/dL (ref 70–140)
Potassium: 3.9 mmol/L (ref 3.5–5.1)
SODIUM: 142 mmol/L (ref 136–145)
TOTAL PROTEIN: 7.2 g/dL (ref 6.4–8.3)

## 2018-02-12 LAB — IRON AND TIBC
Iron: 85 ug/dL (ref 41–142)
Saturation Ratios: 27 % (ref 21–57)
TIBC: 316 ug/dL (ref 236–444)
UIBC: 231 ug/dL

## 2018-02-12 LAB — CBC WITH DIFFERENTIAL (CANCER CENTER ONLY)
BASOS ABS: 0 10*3/uL (ref 0.0–0.1)
BASOS PCT: 0 %
EOS ABS: 0.1 10*3/uL (ref 0.0–0.5)
Eosinophils Relative: 3 %
HEMATOCRIT: 38.5 % (ref 34.8–46.6)
HEMOGLOBIN: 13.7 g/dL (ref 11.6–15.9)
Lymphocytes Relative: 45 %
Lymphs Abs: 1.5 10*3/uL (ref 0.9–3.3)
MCH: 31.1 pg (ref 26.0–34.0)
MCHC: 35.6 g/dL (ref 32.0–36.0)
MCV: 87.3 fL (ref 81.0–101.0)
Monocytes Absolute: 0.2 10*3/uL (ref 0.1–0.9)
Monocytes Relative: 6 %
NEUTROS ABS: 1.5 10*3/uL (ref 1.5–6.5)
NEUTROS PCT: 46 %
Platelet Count: 129 10*3/uL — ABNORMAL LOW (ref 145–400)
RBC: 4.41 MIL/uL (ref 3.70–5.32)
RDW: 12.5 % (ref 11.1–15.7)
WBC: 3.3 10*3/uL — AB (ref 3.9–10.0)

## 2018-02-12 LAB — CEA (IN HOUSE-CHCC): CEA (CHCC-IN HOUSE): 4.63 ng/mL (ref 0.00–5.00)

## 2018-02-12 LAB — FERRITIN: Ferritin: 289 ng/mL — ABNORMAL HIGH (ref 9–269)

## 2018-02-12 NOTE — Progress Notes (Signed)
Hematology and Oncology Follow Up Visit  Mallory Morales [id number] 1956-09-02 62 y.o. 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 is here today for follow-up.  She is doing quite well.  We last saw her back in October.  Everything is going well.  She got a new dog in January.  She is very excited about this.  She is on Xarelto.  She is on 10 mg a day.  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  This was in the context of her being hospitalized with rhabdomyolysis.  I will keep her on the 10 mg dose until we see her back.  I would probably would then favor the 5 mg dose chronically.  She is had no abdominal pain.  She has had no nausea or vomiting.  She is had no change in bowel or bladder habits.  She is had no bleeding.  Her last iron studies back in October 2018 showed a ferritin of 350 with iron saturation of 22%.  Her last CEA level was 5 back in October.  This is stable.  Overall, her performance status is ECOG 0.  She is working.  Patient will be going to [place] on a river cruise in August.    Medications:  Allergies as of 02/12/2018      Reactions   Simvastatin    Severe rhabdomyolysis.   Strawberry Extract Swelling   Face only. Breathing not affected.      Medication List        Accurate as of 02/12/18  8:24 AM. Always use your most recent med list.          hydrochlorothiazide 25 MG tablet Commonly known as:  HYDRODIURIL 12.5 mg daily. Takes 1/2 tab in the morning   NONFORMULARY OR COMPOUNDED [place]: Scar Cream - Verapamil 10%, Pentoxifylline 5%, apply to affected area 3-4 times daily.   rivaroxaban 10  MG Tabs tablet Commonly known as:  XARELTO Take 1 tablet (10 mg total) by mouth daily with supper.   rosuvastatin 10 MG tablet Commonly known as:  CRESTOR       Allergies:  Allergies  Allergen Reactions  . Simvastatin     Severe rhabdomyolysis.  . Strawberry Extract Swelling    Face only. Breathing not affected.    Past Medical History, Surgical history, Social history, and Family History were reviewed and updated.  Review of Systems: Review of Systems  Constitutional: Negative.   HENT: Negative.   Eyes: Negative.   Respiratory: Negative.   Cardiovascular: Negative.   Gastrointestinal: Negative.   Genitourinary: Negative.   Musculoskeletal: Negative.   Skin: Negative.   Neurological: Negative.   Endo/Heme/Allergies: Negative.   Psychiatric/Behavioral: Negative.    Physical Exam:  weight is 185 lb (83.9 kg). Her oral temperature is 98.4 F (36.9 C). Her blood pressure is 131/87 and her pulse is 82. Her respiration is 16 and oxygen saturation is 100%.   Wt Readings from Last 3 Encounters:  02/12/18 185 lb (83.9 kg)  10/16/17 184 lb (83.5 kg)  06/19/17 184 lb (83.5 kg)    Physical Exam  Constitutional: She is oriented  to person, place, and time.  HENT:  Head: Normocephalic and atraumatic.  Mouth/Throat: Oropharynx is clear and moist.  Eyes: EOM are normal. Pupils are equal, round, and reactive to light.  Neck: Normal range of motion.  Cardiovascular: Normal rate, regular rhythm and normal heart sounds.  Pulmonary/Chest: Effort normal and breath sounds normal.  Abdominal: Soft. Bowel sounds are normal.  Musculoskeletal: Normal range of motion. She exhibits no edema, tenderness or deformity.  Lymphadenopathy:    She has no cervical adenopathy.  Neurological: She is alert and oriented to person, place, and time.  Skin: Skin is warm and dry. No rash noted. No erythema.  Psychiatric: She has a normal mood and affect. Her behavior is normal. Judgment and thought  content normal.  Vitals reviewed.   Lab Results  Component Value Date   WBC 3.3 (L) 02/12/2018   HGB 14.1 10/16/2017   HCT 38.5 02/12/2018   MCV 87.3 02/12/2018   PLT 129 (L) 02/12/2018   Lab Results  Component Value Date   FERRITIN 349 (H) 10/16/2017   IRON 74 10/16/2017   TIBC 331 10/16/2017   UIBC 258 10/16/2017   IRONPCTSAT 22 10/16/2017   Lab Results  Component Value Date   RETICCTPCT 1.2 03/01/2015   RBC 4.41 02/12/2018   RETICCTABS 52.1 03/01/2015   No results found for: KPAFRELGTCHN, LAMBDASER, KAPLAMBRATIO No results found for: IGGSERUM, IGA, IGMSERUM No results found for: Kathrynn Ducking, MSPIKE, SPEI   Chemistry      Component Value Date/Time   NA 146 (H) 10/16/2017 0804   NA 141 03/13/2017 0807   K 3.8 10/16/2017 0804   K 3.9 03/13/2017 0807   CL 106 10/16/2017 0804   CO2 29 10/16/2017 0804   CO2 27 03/13/2017 0807   BUN 10 10/16/2017 0804   BUN 12.4 03/13/2017 0807   CREATININE 1.0 10/16/2017 0804   CREATININE 0.8 03/13/2017 0807      Component Value Date/Time   CALCIUM 9.3 10/16/2017 0804   CALCIUM 9.6 03/13/2017 0807   ALKPHOS 72 10/16/2017 0804   ALKPHOS 70 03/13/2017 0807   AST 24 10/16/2017 0804   AST 16 03/13/2017 0807   ALT 28 10/16/2017 0804   ALT 18 03/13/2017 0807   BILITOT 0.80 10/16/2017 0804   BILITOT 0.93 03/13/2017 0807      Impression and Plan: [name] is a lovely 62 yo African American female with history of stage II adenocarcinoma of the sigmoid colon.  Her Oncotype score was 20.  She had 36 lymph nodes that were negative. She had her surgery in January of 2013 and did not require adjuvant treatment.   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  All of her hypercoagulable studies were normal.    She is doing well on Xarelto 10 mg PO daily and has had no evidence of recurrence.  Her last Doppler back in April 2018 showed no residual thrombus.  I  probably would consider her for a 5 mg chronic dose.  I will talk to her about this when we see her back in 6 months.  We will have to see what her iron studies show.  We will plan to get her back in 6 months.  [name], MD 2/22/20198:24 AM

## 2018-02-16 ENCOUNTER — Ambulatory Visit: Payer: BLUE CROSS/BLUE SHIELD | Admitting: Hematology & Oncology

## 2018-02-16 ENCOUNTER — Other Ambulatory Visit: Payer: BLUE CROSS/BLUE SHIELD

## 2018-03-06 IMAGING — US US EXTREM LOW*R* LIMITED
1 series · 14 of 19 positions shown · non-contrast
Comparison: Radiographs of the right foot dated 01/20/2017

CLINICAL DATA: Plantar fibromatosis.  Soft tissue mass.

EXAM:
ULTRASOUND OF THE RIGHT LOWER EXTREMITY LIMITED
TECHNIQUE: Ultrasound examination of the lower extremity soft tissues was
performed in the area of clinical concern.

[Series 1: us extrem low*right* limited · 0.04mm/px · 19 acquisitions, 14 frames shown]
[im 1/19]
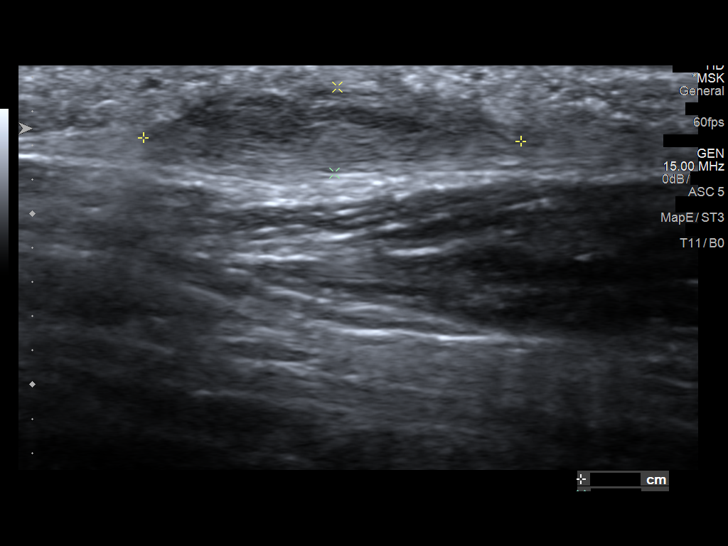
[im 3/19]
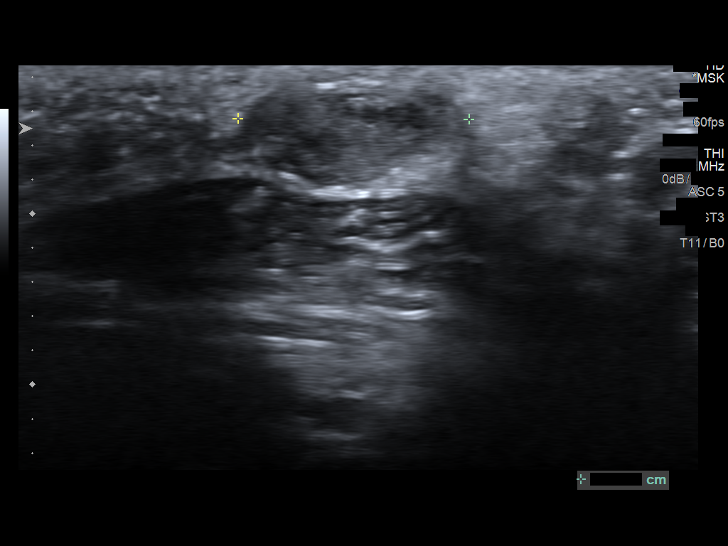
[im 4/19]
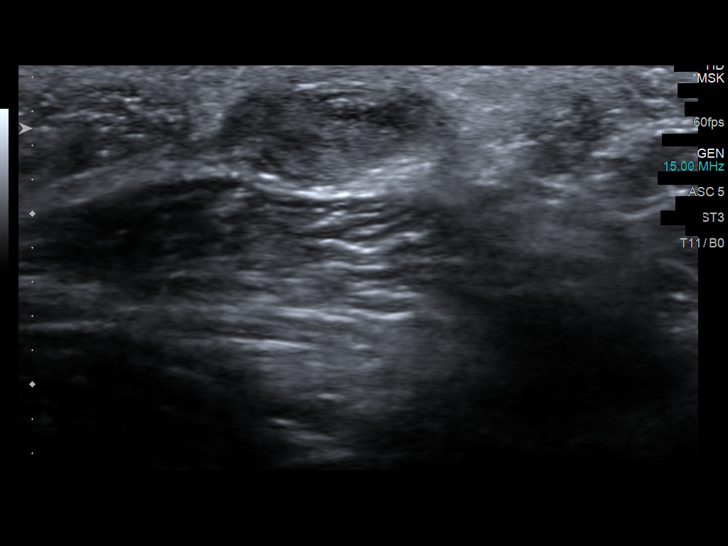
[im 5/19]
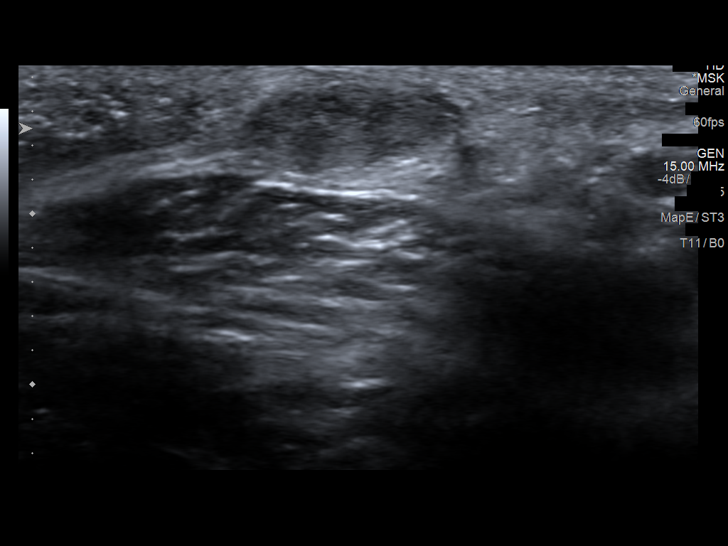
[im 7/19]
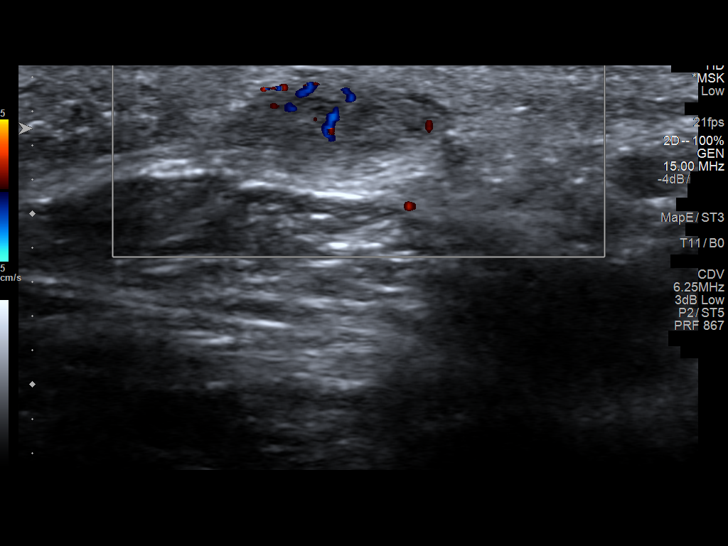
[im 8/19]
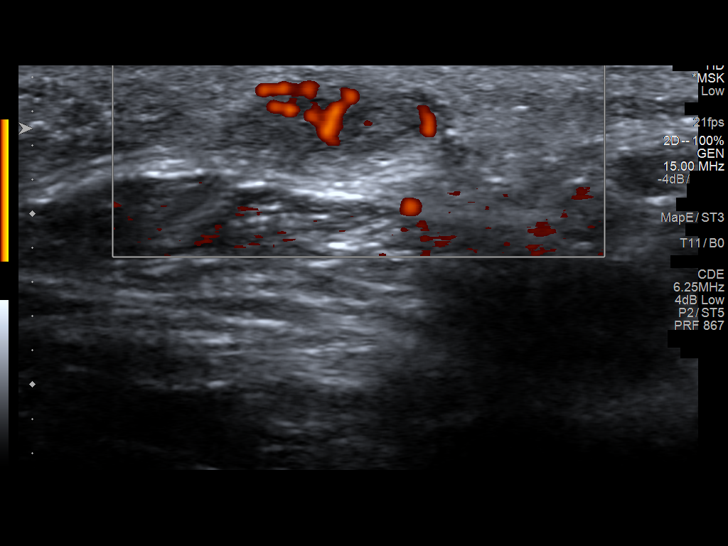
[im 9/19]
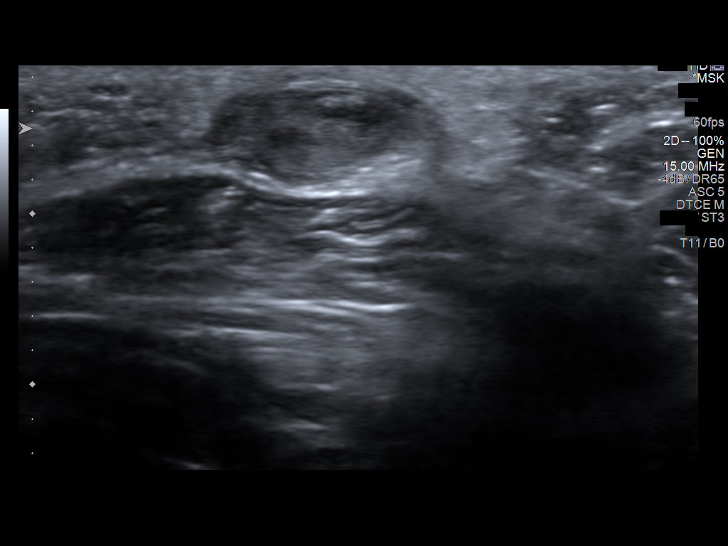
[im 11/19]
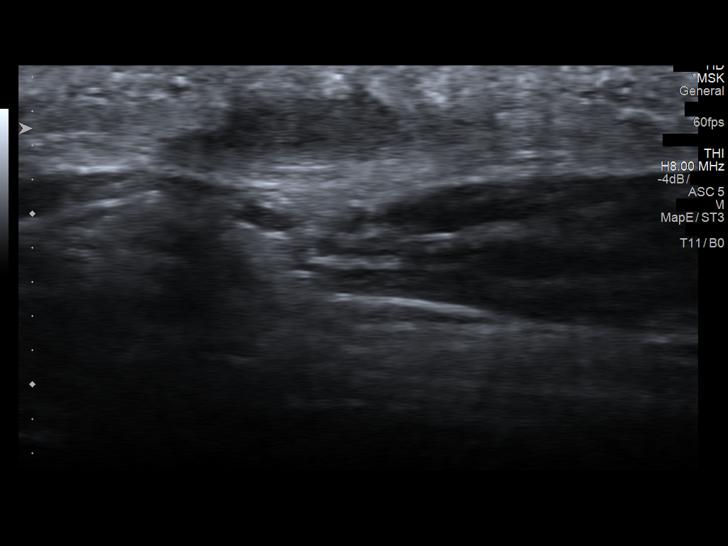
[im 12/19]
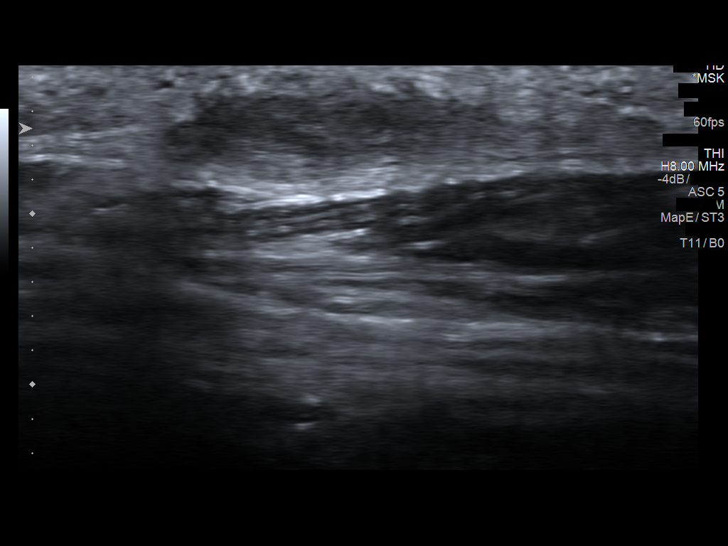
[im 13/19]
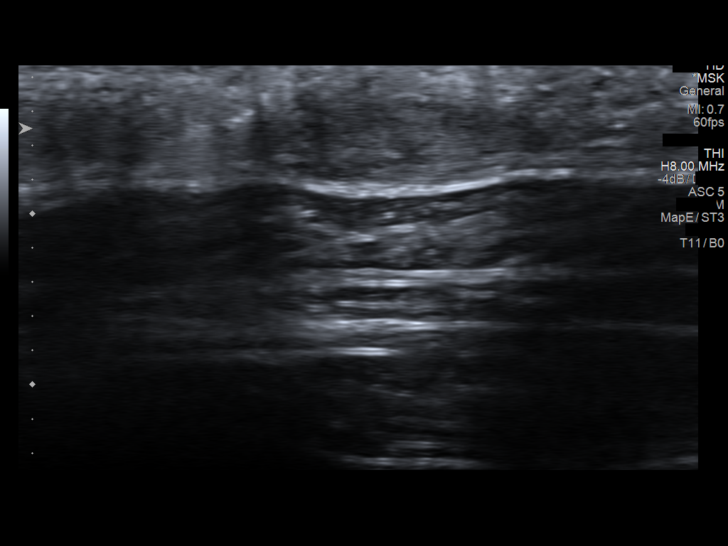
[im 15/19]
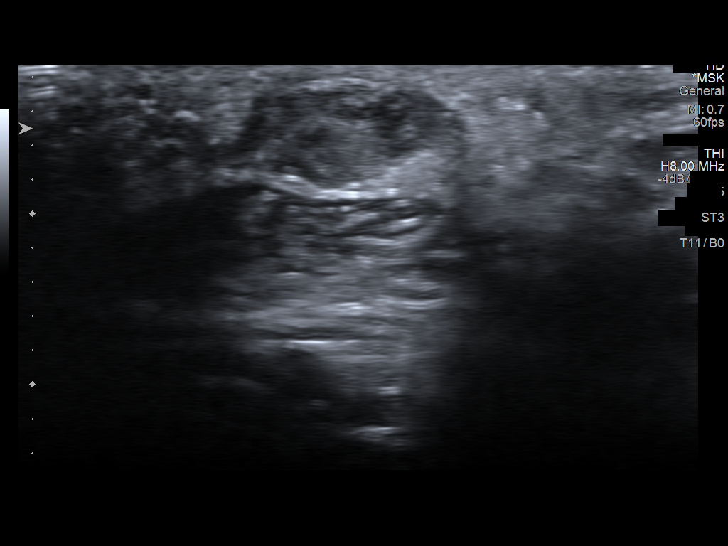
[im 16/19]
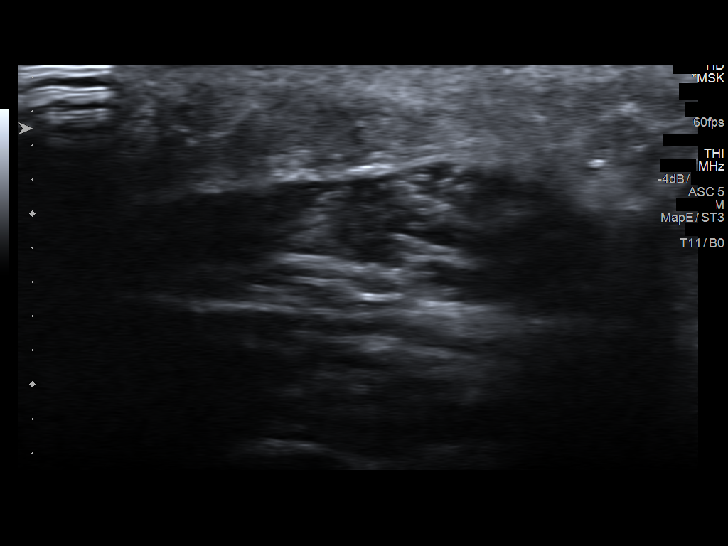
[im 17/19]
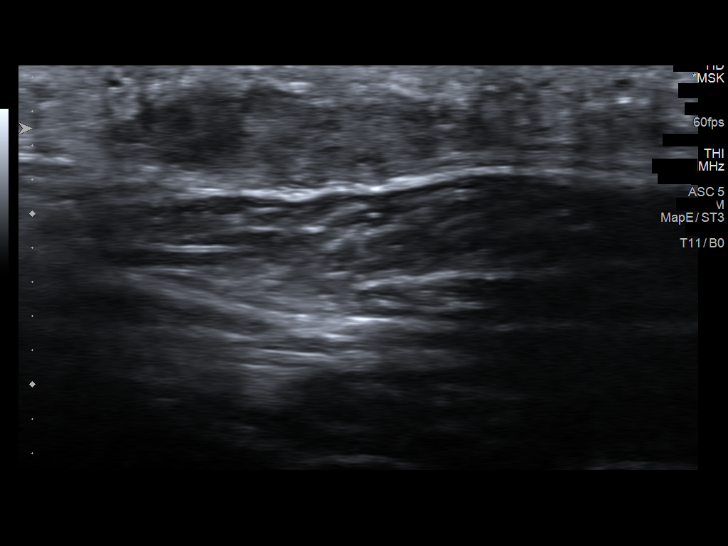
[im 19/19]
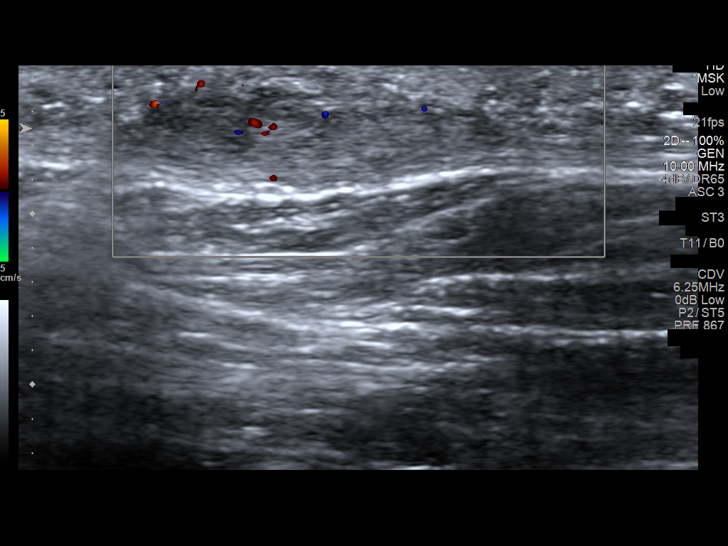

[14 of 19 positions shown; findings below may reference images not displayed]

FINDINGS: There is a 2.2 x 1.4 x 0.6 cm inhomogeneous solid mass involving the
plantar fascia consistent with plantar fibromatosis. There is some
of blood flow within this lesion, which is typical. The underlying
muscle structures are normal. No abnormal fluid collections.
IMPRESSION: Focal plantar fibromatosis.

## 2018-04-19 DIAGNOSIS — E7800 Pure hypercholesterolemia, unspecified: Secondary | ICD-10-CM | POA: Diagnosis not present

## 2018-06-20 ENCOUNTER — Other Ambulatory Visit: Payer: Self-pay | Admitting: Hematology & Oncology

## 2018-06-20 DIAGNOSIS — C186 Malignant neoplasm of descending colon: Secondary | ICD-10-CM

## 2018-06-20 DIAGNOSIS — I82413 Acute embolism and thrombosis of femoral vein, bilateral: Secondary | ICD-10-CM

## 2018-06-20 DIAGNOSIS — D501 Sideropenic dysphagia: Secondary | ICD-10-CM

## 2018-07-09 DIAGNOSIS — Z1231 Encounter for screening mammogram for malignant neoplasm of breast: Secondary | ICD-10-CM | POA: Diagnosis not present

## 2018-07-29 DIAGNOSIS — R7303 Prediabetes: Secondary | ICD-10-CM | POA: Diagnosis not present

## 2018-07-29 DIAGNOSIS — I10 Essential (primary) hypertension: Secondary | ICD-10-CM | POA: Diagnosis not present

## 2018-07-29 DIAGNOSIS — E7800 Pure hypercholesterolemia, unspecified: Secondary | ICD-10-CM | POA: Diagnosis not present

## 2018-08-13 ENCOUNTER — Inpatient Hospital Stay: Payer: BLUE CROSS/BLUE SHIELD | Attending: Hematology & Oncology | Admitting: Hematology & Oncology

## 2018-08-13 ENCOUNTER — Other Ambulatory Visit: Payer: Self-pay

## 2018-08-13 ENCOUNTER — Encounter: Payer: Self-pay | Admitting: Hematology & Oncology

## 2018-08-13 ENCOUNTER — Inpatient Hospital Stay: Payer: BLUE CROSS/BLUE SHIELD

## 2018-08-13 VITALS — BP 146/88 | HR 80 | Temp 98.3°F | Resp 18 | Wt 186.0 lb

## 2018-08-13 DIAGNOSIS — Z7901 Long term (current) use of anticoagulants: Secondary | ICD-10-CM | POA: Diagnosis not present

## 2018-08-13 DIAGNOSIS — Z86718 Personal history of other venous thrombosis and embolism: Secondary | ICD-10-CM | POA: Diagnosis not present

## 2018-08-13 DIAGNOSIS — D500 Iron deficiency anemia secondary to blood loss (chronic): Secondary | ICD-10-CM

## 2018-08-13 DIAGNOSIS — D501 Sideropenic dysphagia: Secondary | ICD-10-CM

## 2018-08-13 DIAGNOSIS — I82413 Acute embolism and thrombosis of femoral vein, bilateral: Secondary | ICD-10-CM

## 2018-08-13 DIAGNOSIS — C186 Malignant neoplasm of descending colon: Secondary | ICD-10-CM

## 2018-08-13 DIAGNOSIS — Z85038 Personal history of other malignant neoplasm of large intestine: Secondary | ICD-10-CM | POA: Diagnosis not present

## 2018-08-13 LAB — CBC WITH DIFFERENTIAL (CANCER CENTER ONLY)
BASOS ABS: 0 10*3/uL (ref 0.0–0.1)
Basophils Relative: 1 %
EOS ABS: 0.1 10*3/uL (ref 0.0–0.5)
Eosinophils Relative: 3 %
HEMATOCRIT: 40.3 % (ref 34.8–46.6)
Hemoglobin: 14.1 g/dL (ref 11.6–15.9)
Lymphocytes Relative: 44 %
Lymphs Abs: 1.5 10*3/uL (ref 0.9–3.3)
MCH: 30.9 pg (ref 26.0–34.0)
MCHC: 35 g/dL (ref 32.0–36.0)
MCV: 88.2 fL (ref 81.0–101.0)
MONO ABS: 0.2 10*3/uL (ref 0.1–0.9)
Monocytes Relative: 7 %
NEUTROS ABS: 1.6 10*3/uL (ref 1.5–6.5)
NEUTROS PCT: 45 %
Platelet Count: 133 10*3/uL — ABNORMAL LOW (ref 145–400)
RBC: 4.57 MIL/uL (ref 3.70–5.32)
RDW: 12.5 % (ref 11.1–15.7)
WBC Count: 3.4 10*3/uL — ABNORMAL LOW (ref 3.9–10.0)

## 2018-08-13 LAB — CMP (CANCER CENTER ONLY)
ALK PHOS: 77 U/L (ref 38–126)
ALT: 20 U/L (ref 0–44)
ANION GAP: 8 (ref 5–15)
AST: 20 U/L (ref 15–41)
Albumin: 4.4 g/dL (ref 3.5–5.0)
BUN: 14 mg/dL (ref 6–20)
CALCIUM: 10 mg/dL (ref 8.9–10.3)
CO2: 31 mmol/L (ref 22–32)
CREATININE: 0.85 mg/dL (ref 0.44–1.00)
Chloride: 106 mmol/L (ref 98–111)
GFR, Estimated: >60 mL/min (ref >60)
GLUCOSE: 119 mg/dL — AB (ref 70–99)
Potassium: 4.3 mmol/L (ref 3.5–5.1)
Sodium: 145 mmol/L (ref 135–145)
Total Bilirubin: 0.6 mg/dL (ref 0.3–1.2)
Total Protein: 7.5 g/dL (ref 6.5–8.1)

## 2018-08-13 LAB — IRON AND TIBC
Iron: 74 ug/dL (ref 41–142)
Saturation Ratios: 20 % — ABNORMAL LOW (ref 21–57)
TIBC: 378 ug/dL (ref 236–444)
UIBC: 304 ug/dL

## 2018-08-13 LAB — FERRITIN: Ferritin: 260 ng/mL (ref 11–307)

## 2018-08-13 LAB — CEA (IN HOUSE-CHCC): CEA (CHCC-In House): 5.23 ng/mL — ABNORMAL HIGH (ref 0.00–5.00)

## 2018-08-13 MED ORDER — RIVAROXABAN 2.5 MG PO TABS: 5.0000 mg | ORAL_TABLET | Freq: Every day | ORAL | 4 refills | Status: DC

## 2018-08-13 NOTE — Progress Notes (Signed)
Hematology and Oncology Follow Up Visit  DILAN FULLENWIDER [id number] 1956-01-23 62 y.o. 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 Seguin is here today for follow-up.  She is doing quite well.  She still working quite a bit.  She is getting ready to go on her liver cruise in September.  She will leave in the middle of September and come back in early October.  She is had no issues with the Xarelto.  She is on 10 mg a day.  I will get on 5 mg a day after she gets back from [place].   Her last CEA was doing well.  The last CEA was 4.3.  She is had no problems with abdominal pain.  She has had no change in bowel or bladder habits.  She is had no rashes.  She is had no cough or shortness of breath.  There is been no fever.  Overall, her performance status is ECOG 0.    Medications:  Allergies as of 08/13/2018      Reactions   Simvastatin    Severe rhabdomyolysis.   Strawberry Extract Swelling   Face only. Breathing not affected.      Medication List        Accurate as of 08/13/18  8:56 AM. Always use your most recent med list.          hydrochlorothiazide 25 MG tablet Commonly known as:  HYDRODIURIL 25 mg daily. Takes 1 tab in the morning   NONFORMULARY OR COMPOUNDED [place]: Scar Cream - Verapamil 10%, Pentoxifylline 5%, apply to affected area 3-4 times daily.   rosuvastatin 10 MG tablet Commonly known as:  CRESTOR   XARELTO 10 MG Tabs tablet Generic drug:  rivaroxaban TAKE ONE TABLET BY MOUTH DAILY WITH SUPPER       Allergies:  Allergies  Allergen Reactions  . Simvastatin     Severe rhabdomyolysis.  . Strawberry Extract Swelling   Face only. Breathing not affected.    Past Medical History, Surgical history, Social history, and Family History were reviewed and updated.  Review of Systems: Review of Systems  Constitutional: Negative.   HENT: Negative.   Eyes: Negative.   Respiratory: Negative.   Cardiovascular: Negative.   Gastrointestinal: Negative.   Genitourinary: Negative.   Musculoskeletal: Negative.   Skin: Negative.   Neurological: Negative.   Endo/Heme/Allergies: Negative.   Psychiatric/Behavioral: Negative.    Physical Exam:  vitals were not taken for this visit.   Wt Readings from Last 3 Encounters:  02/12/18 185 lb (83.9 kg)  10/16/17 184 lb (83.5 kg)  06/19/17 184 lb (83.5 kg)    Physical Exam  Constitutional: She is oriented to person, place, and time.  HENT:  Head: Normocephalic and atraumatic.  Mouth/Throat: Oropharynx is clear and moist.  Eyes: Pupils are equal, round, and reactive to light. EOM are normal.  Neck: Normal range of motion.  Cardiovascular: Normal rate, regular rhythm and normal heart sounds.  Pulmonary/Chest: Effort normal and breath sounds normal.  Abdominal: Soft. Bowel sounds are normal.  Musculoskeletal: Normal range of motion. She exhibits no edema, tenderness or deformity.  Lymphadenopathy:    She has no cervical  adenopathy.  Neurological: She is alert and oriented to person, place, and time.  Skin: Skin is warm and dry. No rash noted. No erythema.  Psychiatric: She has a normal mood and affect. Her behavior is normal. Judgment and thought content normal.  Vitals reviewed.   Lab Results  Component Value Date   WBC 3.4 (L) 08/13/2018   HGB 14.1 08/13/2018   HCT 40.3 08/13/2018   MCV 88.2 08/13/2018   PLT 133 (L) 08/13/2018   Lab Results  Component Value Date   FERRITIN 289 (H) 02/12/2018   IRON 85 02/12/2018   TIBC 316 02/12/2018   UIBC 231 02/12/2018   IRONPCTSAT 27 02/12/2018   Lab Results  Component Value Date   RETICCTPCT 1.2 03/01/2015    RBC 4.57 08/13/2018   RETICCTABS 52.1 03/01/2015   No results found for: KPAFRELGTCHN, LAMBDASER, KAPLAMBRATIO No results found for: IGGSERUM, IGA, IGMSERUM No results found for: Odetta Pink, SPEI   Chemistry      Component Value Date/Time   NA 142 02/12/2018 0752   NA 146 (H) 10/16/2017 0804   NA 141 03/13/2017 0807   K 3.9 02/12/2018 0752   K 3.8 10/16/2017 0804   K 3.9 03/13/2017 0807   CL 106 02/12/2018 0752   CL 106 10/16/2017 0804   CO2 26 02/12/2018 0752   CO2 29 10/16/2017 0804   CO2 27 03/13/2017 0807   BUN 12 02/12/2018 0752   BUN 10 10/16/2017 0804   BUN 12.4 03/13/2017 0807   CREATININE 0.85 02/12/2018 0752   CREATININE 1.0 10/16/2017 0804   CREATININE 0.8 03/13/2017 0807      Component Value Date/Time   CALCIUM 9.8 02/12/2018 0752   CALCIUM 9.3 10/16/2017 0804   CALCIUM 9.6 03/13/2017 0807   ALKPHOS 78 02/12/2018 0752   ALKPHOS 72 10/16/2017 0804   ALKPHOS 70 03/13/2017 0807   AST 17 02/12/2018 0752   AST 16 03/13/2017 0807   ALT 16 02/12/2018 0752   ALT 28 10/16/2017 0804   ALT 18 03/13/2017 0807   BILITOT 0.7 02/12/2018 0752   BILITOT 0.93 03/13/2017 0807      Impression and Plan: Ms. Cuthrell is a lovely 62 yo African American female with history of stage II adenocarcinoma of the sigmoid colon.  Her Oncotype score was 20.  She had 36 lymph nodes that were negative. She had her surgery in January of 2013 and did not require adjuvant treatment.   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  All of her hypercoagulable studies were normal.   I will keep her on the 10 mg of Xarelto until after she is back from her vacation in [place].  This will be in early October.  After that, we will then get her on the 5 mg daily dose.  I do not see a need for scans right now.  We will get her back in 6 months.    [name], MD 8/23/20198:56 AM

## 2018-09-28 ENCOUNTER — Encounter: Payer: Self-pay | Admitting: Hematology & Oncology

## 2018-10-19 ENCOUNTER — Encounter: Payer: Self-pay | Admitting: Hematology & Oncology

## 2018-10-23 IMAGING — CR DG CHEST 1V PORT
1 series · 1 of 1 positions shown · non-contrast
Comparison: Portable exam 3858 hours compared to 01/30/2012 and
correlated with interval CT chest of 09/07/2014

CLINICAL DATA: Dry cough for 5 days, history hypertension, former
smoker, colon cancer, pulmonary embolism

EXAM:
PORTABLE CHEST 1 VIEW

[AP]
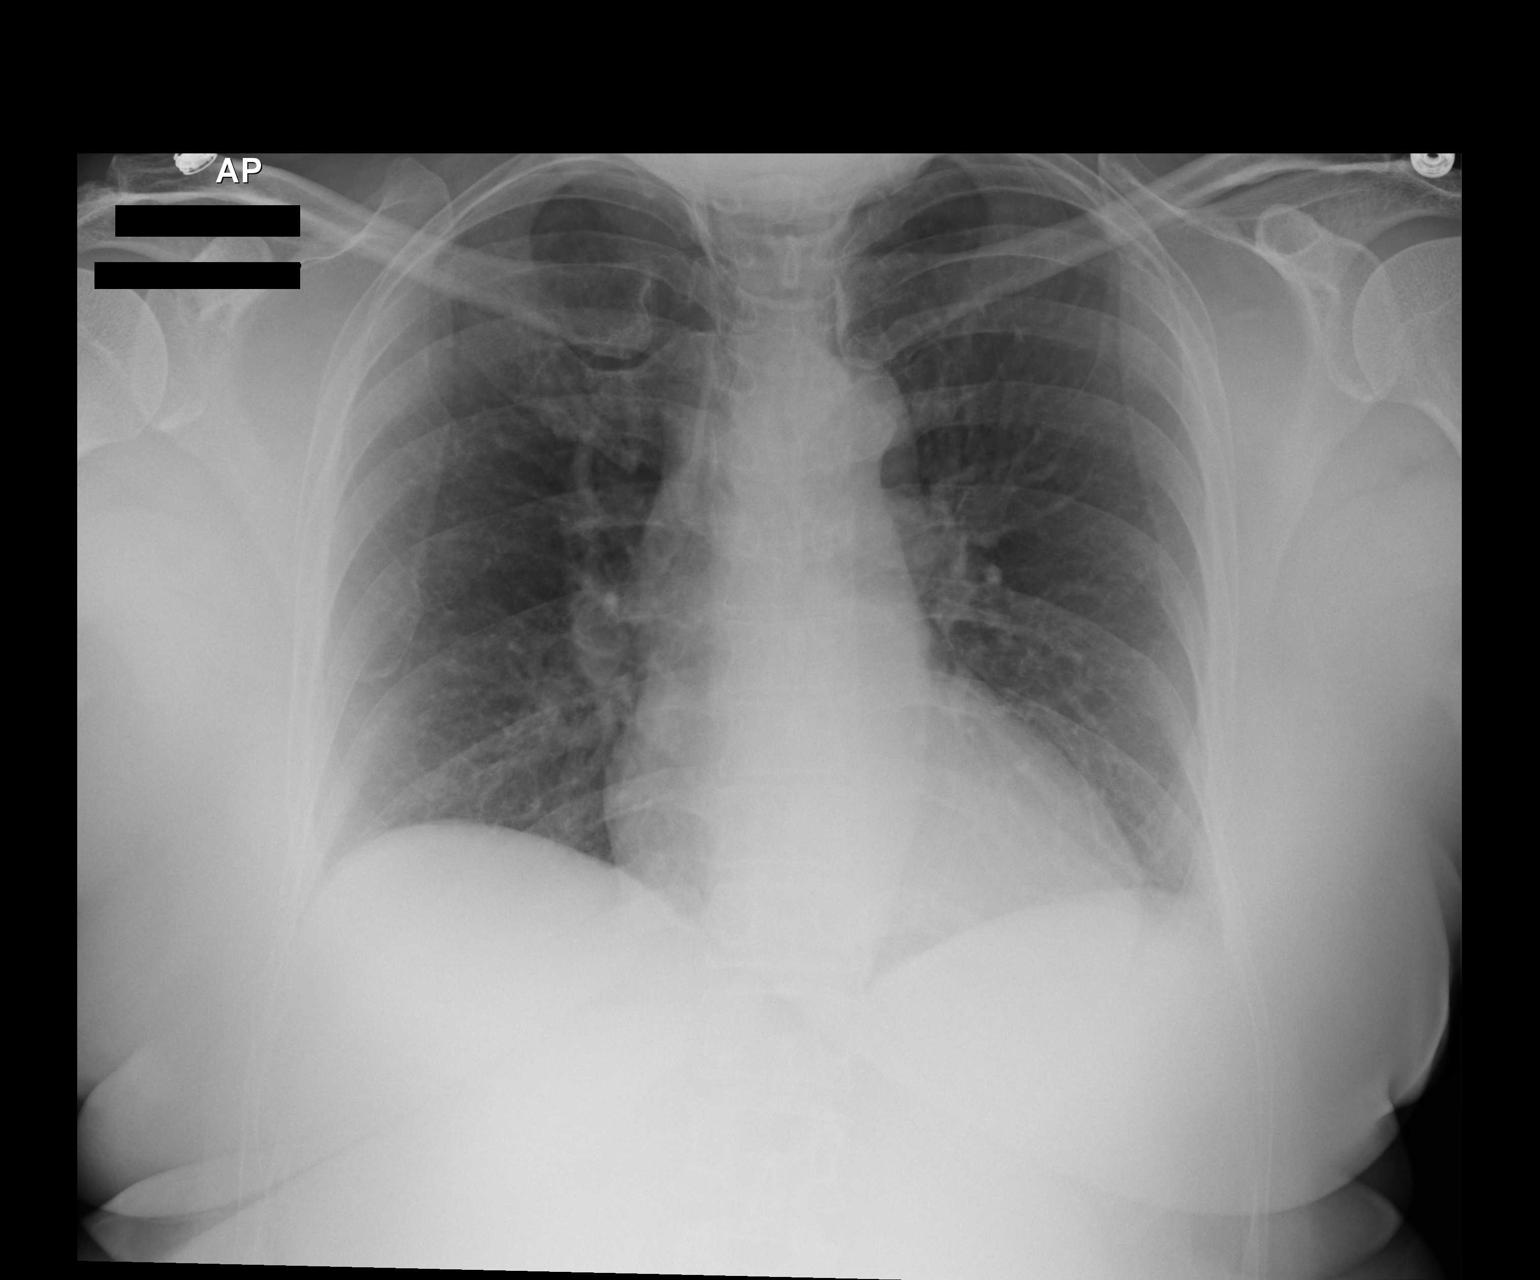

[1 of 1 positions shown; findings below may reference images not displayed]

FINDINGS: Normal heart size, mediastinal contours, and pulmonary vascularity.

Lungs clear.

No pleural effusion or pneumothorax.

Bones unremarkable.
IMPRESSION: No acute abnormalities.

## 2018-10-27 IMAGING — MR MR [PERSON_NAME] LOW W/O CM*L*
11 of 16 series · 29 of 40 positions shown · non-contrast
Comparison: None.

CLINICAL DATA: Bilateral lower extremity pain for 1 week with
rhabdomyolysis.

EXAM:
MR OF THE LEFT FEMUR WITHOUT CONTRAST; MRI OF LOWER RIGHT EXTREMITY
WITHOUT CONTRAST; MRI OF THE RIGHT FEMUR WITHOUT CONTRAST; MRI OF
LOWER LEFT EXTREMITY WITHOUT CONTRAST
TECHNIQUE: Multiplanar, multisequence MR imaging of the bilateral lower
extremities was performed. No intravenous contrast was administered.

[Series 3: T1 · axial · 5.0mm · 1.37mm/px · z∈[-287,+97]mm · 3 of 60 slices shown (1 of 8)]
[im 1/60]
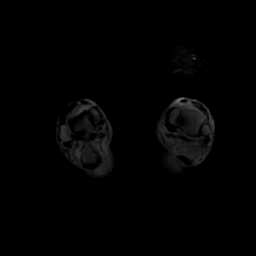
[im 30/60]
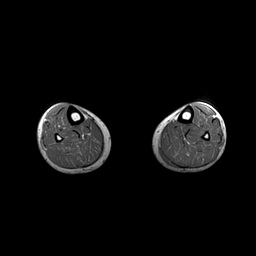
[im 60/60]
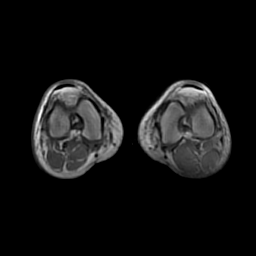

[Series 4: T2 fat-sat · axial · 5.0mm · 1.37mm/px · z∈[-287,+97]mm · 3 of 60 slices shown (1 of 2)]
[im 1/60]
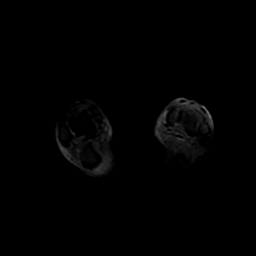
[im 30/60]
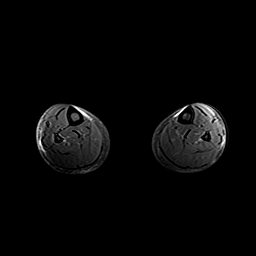
[im 60/60]
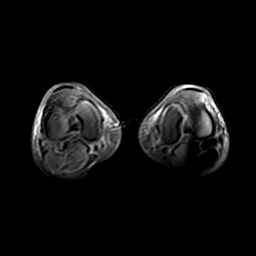

[Series 5: STIR · coronal · 5.0mm · 0.78mm/px · 2 of 28 slices shown]
[im 1/28]
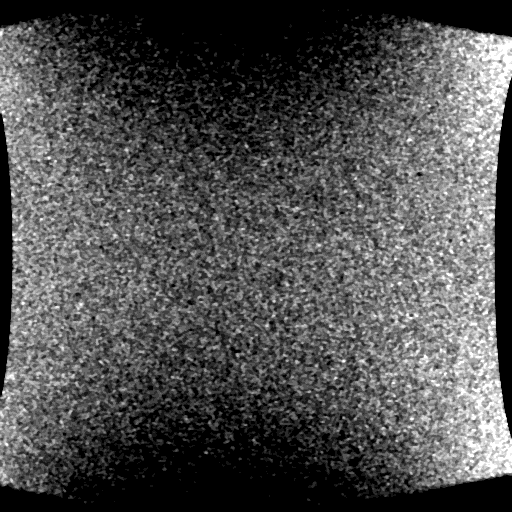
[im 28/28]
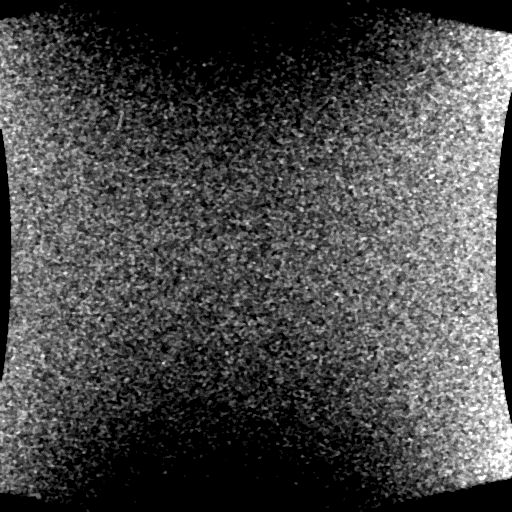

[Series 6: T1 · coronal · 5.0mm · 0.78mm/px · 2 of 28 slices shown (2 of 8)]
[im 1/28]
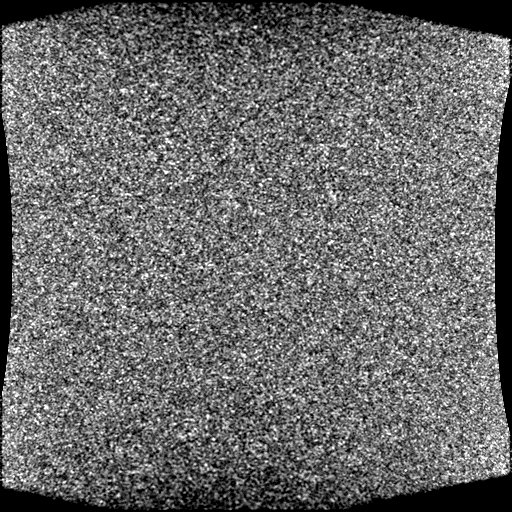
[im 28/28]
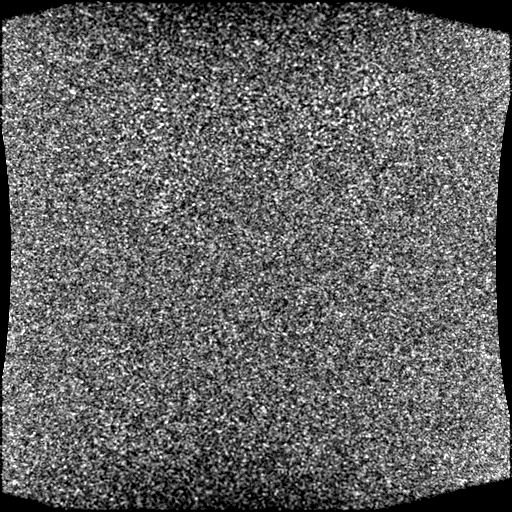

[Series 7: T1 · sagittal · 5.0mm · 0.78mm/px · 2 of 25 slices shown (3 of 8)]
[im 1/25]
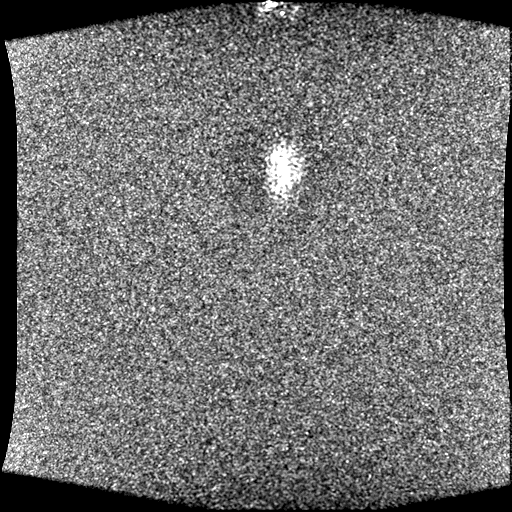
[im 25/25]
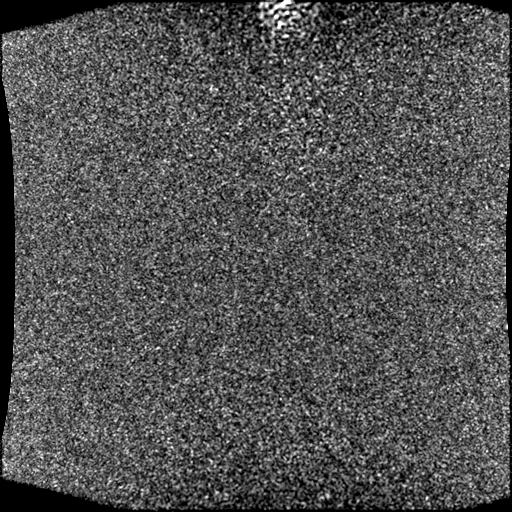

[Series 9: T1 · sagittal · 5.0mm · 0.78mm/px · 2 of 26 slices shown (4 of 8)]
[im 1/26]
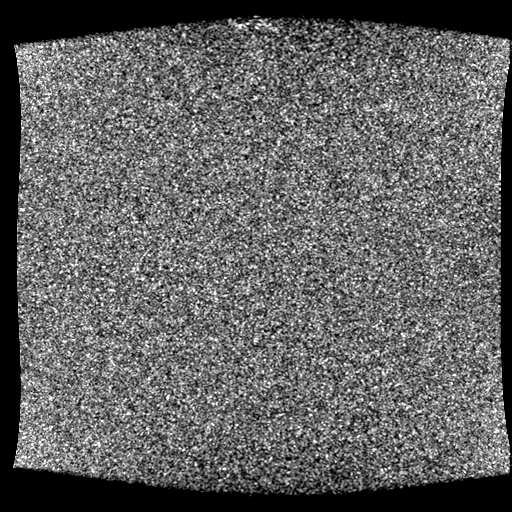
[im 26/26]
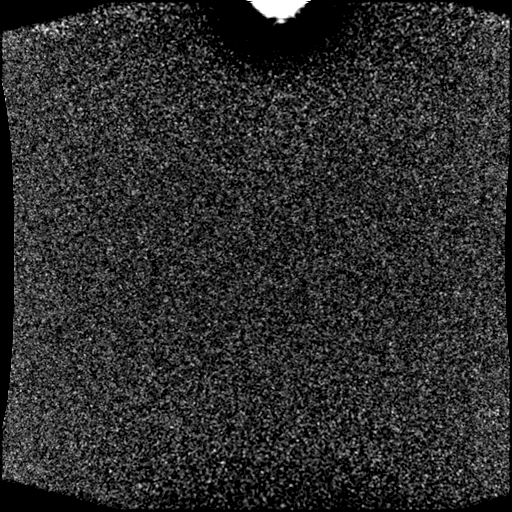

[Series 14: T1 · axial · 5.0mm · 1.88mm/px · z∈[-207,+222]mm · 4 of 67 slices shown (5 of 8)]
[im 1/67]
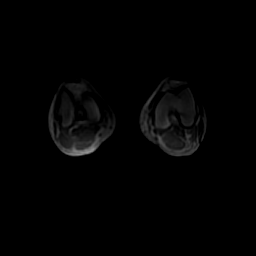
[im 23/67]
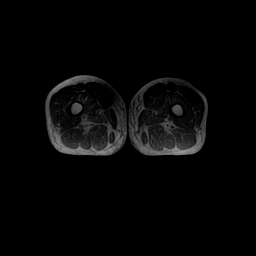
[im 45/67]
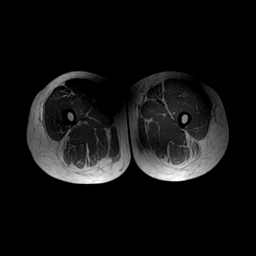
[im 67/67]
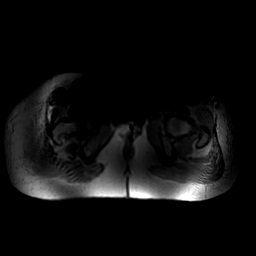

[Series 16: T2 fat-sat · axial · 5.0mm · 1.88mm/px · z∈[-207,+222]mm · 4 of 67 slices shown (2 of 2)]
[im 1/67]
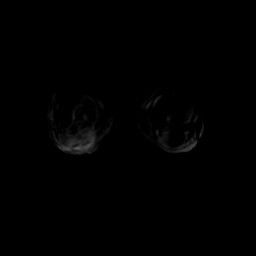
[im 23/67]
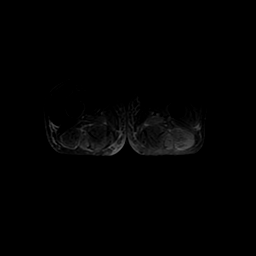
[im 45/67]
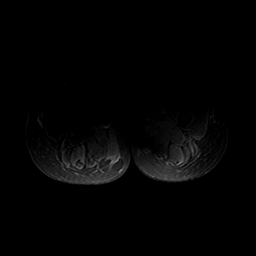
[im 67/67]
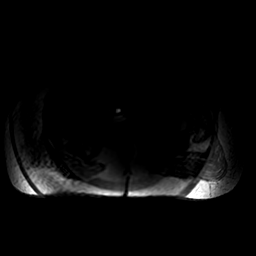

[Series 17: T1 · coronal · 5.0mm · 0.88mm/px · 3 of 43 slices shown (6 of 8)]
[im 1/43]
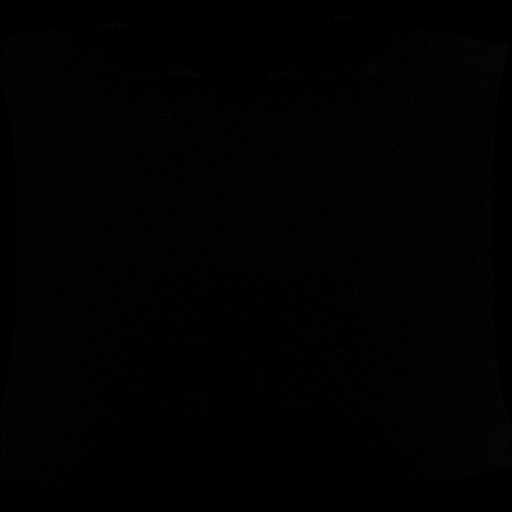
[im 22/43]
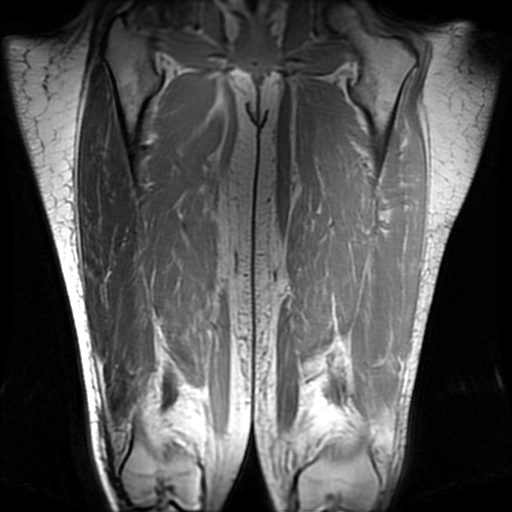
[im 43/43]
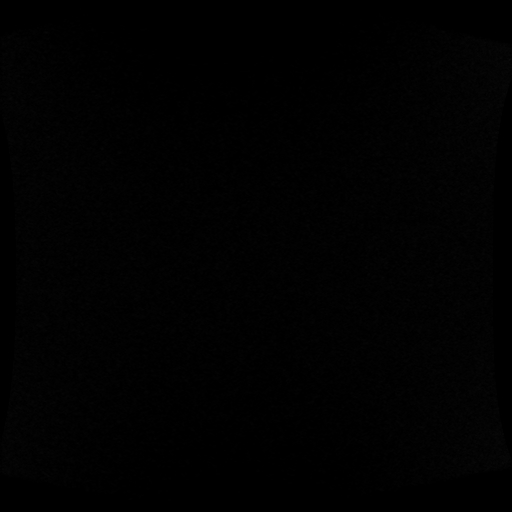

[Series 18: T1 · sagittal · 5.0mm · 0.96mm/px · 2 of 37 slices shown (7 of 8)]
[im 1/37]
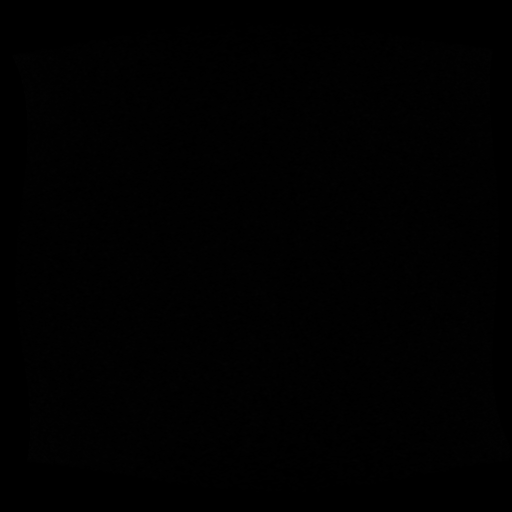
[im 37/37]
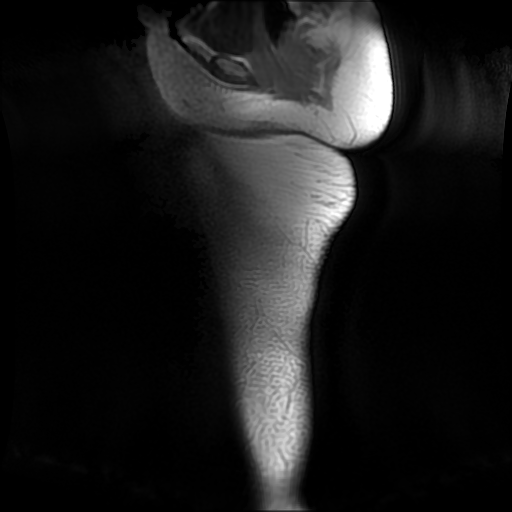

[Series 20: T1 · sagittal · 5.0mm · 0.96mm/px · 2 of 37 slices shown (8 of 8)]
[im 1/37]
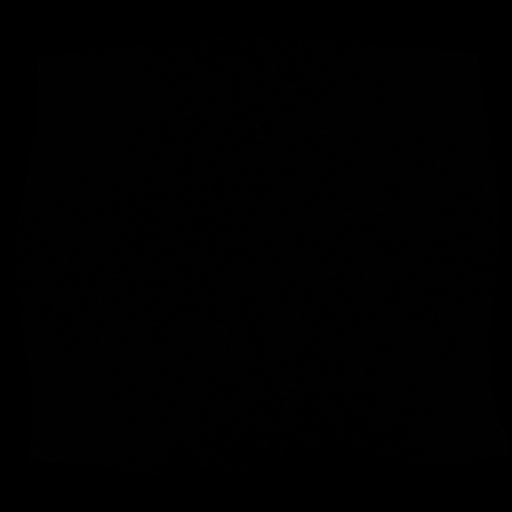
[im 37/37]
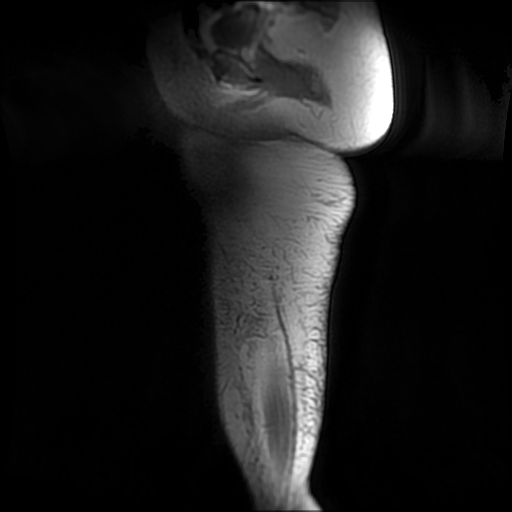

[29 of 40 positions shown; findings below may reference images not displayed]

FINDINGS: Bilateral nonspecific edema like signal abnormality in the thigh and
calf musculature. This is most pronounced in the posterior
compartment of both thighs. It is fairly symmetric bilaterally. The
muscles are enlarged and demonstrate diffuse edema. There is also
some fluid surrounding the muscles.

Similar findings involving the upper calf musculature bilaterally
although not as significant as the thighs. This is mainly involving
the gastrocs muscles bilaterally. The soleus muscles are less in
all. Anterior compartment muscles are also less involved.

No significant bony findings. No findings for septic arthritis or
osteomyelitis.
IMPRESSION: 1. Nonspecific myositis involving the posterior compartments of the
thighs along with the calf musculature bilaterally. This is a
nonspecific finding and can be seen with trauma, overuse syndromes,
drug reaction, inflammatory myopathys such as polymyositis, etc.
2. No findings for cellulitis, septic arthritis or osteomyelitis.

## 2019-01-04 DIAGNOSIS — R079 Chest pain, unspecified: Secondary | ICD-10-CM | POA: Diagnosis not present

## 2019-01-04 DIAGNOSIS — R1012 Left upper quadrant pain: Secondary | ICD-10-CM | POA: Diagnosis not present

## 2019-01-05 ENCOUNTER — Ambulatory Visit
Admission: RE | Admit: 2019-01-05 | Discharge: 2019-01-05 | Disposition: A | Discharge status: Home / Self Care | Payer: BLUE CROSS/BLUE SHIELD | Source: Ambulatory Visit | Attending: Family Medicine | Admitting: Family Medicine

## 2019-01-05 ENCOUNTER — Other Ambulatory Visit: Payer: Self-pay | Admitting: Family Medicine

## 2019-01-05 DIAGNOSIS — R079 Chest pain, unspecified: Secondary | ICD-10-CM

## 2019-01-05 DIAGNOSIS — Z85038 Personal history of other malignant neoplasm of large intestine: Secondary | ICD-10-CM | POA: Diagnosis not present

## 2019-01-05 DIAGNOSIS — R1012 Left upper quadrant pain: Secondary | ICD-10-CM | POA: Diagnosis not present

## 2019-01-05 DIAGNOSIS — M940 Chondrocostal junction syndrome [Tietze]: Secondary | ICD-10-CM | POA: Diagnosis not present

## 2019-02-04 DIAGNOSIS — R7303 Prediabetes: Secondary | ICD-10-CM | POA: Diagnosis not present

## 2019-02-04 DIAGNOSIS — Z85038 Personal history of other malignant neoplasm of large intestine: Secondary | ICD-10-CM | POA: Diagnosis not present

## 2019-02-04 DIAGNOSIS — I10 Essential (primary) hypertension: Secondary | ICD-10-CM | POA: Diagnosis not present

## 2019-02-04 DIAGNOSIS — E7800 Pure hypercholesterolemia, unspecified: Secondary | ICD-10-CM | POA: Diagnosis not present

## 2019-02-11 ENCOUNTER — Inpatient Hospital Stay: Payer: BLUE CROSS/BLUE SHIELD

## 2019-02-11 ENCOUNTER — Telehealth: Payer: Self-pay | Admitting: Family

## 2019-02-11 ENCOUNTER — Inpatient Hospital Stay: Payer: BLUE CROSS/BLUE SHIELD | Attending: Hematology & Oncology | Admitting: Family

## 2019-02-11 DIAGNOSIS — C186 Malignant neoplasm of descending colon: Secondary | ICD-10-CM

## 2019-02-11 DIAGNOSIS — Z85038 Personal history of other malignant neoplasm of large intestine: Secondary | ICD-10-CM | POA: Diagnosis not present

## 2019-02-11 DIAGNOSIS — D500 Iron deficiency anemia secondary to blood loss (chronic): Secondary | ICD-10-CM

## 2019-02-11 DIAGNOSIS — I82413 Acute embolism and thrombosis of femoral vein, bilateral: Secondary | ICD-10-CM

## 2019-02-11 DIAGNOSIS — Z86718 Personal history of other venous thrombosis and embolism: Secondary | ICD-10-CM | POA: Diagnosis not present

## 2019-02-11 DIAGNOSIS — Z7901 Long term (current) use of anticoagulants: Secondary | ICD-10-CM | POA: Diagnosis not present

## 2019-02-11 LAB — CMP (CANCER CENTER ONLY)
ALT: 19 U/L (ref 0–44)
AST: 17 U/L (ref 15–41)
Albumin: 4.7 g/dL (ref 3.5–5.0)
Alkaline Phosphatase: 67 U/L (ref 38–126)
Anion gap: 8 (ref 5–15)
BUN: 13 mg/dL (ref 6–20)
CO2: 30 mmol/L (ref 22–32)
Calcium: 9.4 mg/dL (ref 8.9–10.3)
Chloride: 103 mmol/L (ref 98–111)
Creatinine: 0.9 mg/dL (ref 0.44–1.00)
GFR, Estimated: >60 mL/min (ref >60)
Glucose, Bld: 127 mg/dL — ABNORMAL HIGH (ref 70–99)
Potassium: 4.1 mmol/L (ref 3.5–5.1)
Sodium: 141 mmol/L (ref 135–145)
Total Bilirubin: 0.7 mg/dL (ref 0.3–1.2)
Total Protein: 6.6 g/dL (ref 6.5–8.1)

## 2019-02-11 LAB — CBC WITH DIFFERENTIAL (CANCER CENTER ONLY)
Abs Immature Granulocytes: 0.01 10*3/uL (ref 0.00–0.07)
Basophils Absolute: 0 10*3/uL (ref 0.0–0.1)
Basophils Relative: 1 %
Eosinophils Absolute: 0.1 10*3/uL (ref 0.0–0.5)
Eosinophils Relative: 3 %
HCT: 38.9 % (ref 36.0–46.0)
Hemoglobin: 13.3 g/dL (ref 12.0–15.0)
Immature Granulocytes: 0 %
Lymphocytes Relative: 39 %
Lymphs Abs: 1.6 10*3/uL (ref 0.7–4.0)
MCH: 30.4 pg (ref 26.0–34.0)
MCHC: 34.2 g/dL (ref 30.0–36.0)
MCV: 89 fL (ref 80.0–100.0)
Monocytes Absolute: 0.3 10*3/uL (ref 0.1–1.0)
Monocytes Relative: 7 %
Neutro Abs: 2 10*3/uL (ref 1.7–7.7)
Neutrophils Relative %: 50 %
Platelet Count: 161 10*3/uL (ref 150–400)
RBC: 4.37 MIL/uL (ref 3.87–5.11)
RDW: 12.5 % (ref 11.5–15.5)
WBC: 4 10*3/uL (ref 4.0–10.5)
nRBC: 0 % (ref 0.0–0.2)

## 2019-02-11 LAB — FERRITIN: Ferritin: 301 ng/mL (ref 11–307)

## 2019-02-11 LAB — IRON AND TIBC
Iron: 62 ug/dL (ref 41–142)
Saturation Ratios: 18 % — ABNORMAL LOW (ref 21–57)
TIBC: 348 ug/dL (ref 236–444)
UIBC: 285 ug/dL (ref 120–384)

## 2019-02-11 LAB — CEA (IN HOUSE-CHCC): CEA (CHCC-In House): 4.59 ng/mL (ref 0.00–5.00)

## 2019-02-11 NOTE — Telephone Encounter (Signed)
Appointments scheduled avs/calendar printed  per 2/21 los °

## 2019-02-11 NOTE — Progress Notes (Signed)
Hematology and Oncology Follow Up Visit  Mallory Morales [id number] November 17, 1956 63 y.o. 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 She is doing well and has no complaints at this time.  Her CEA is 4.59.  She will be having her colonoscopy next week. We will have her hold her Xarelto 2 days before and restart 1 day after the procedure if no biopsy or polypectomy performed. If she has a biopsy or polypectomy she will restart 2 days after the procedure. She verbalized understanding and I did write these directions down for her to take home.  No episodes of bleeding, no bruising or petechiae.  No fever, chills, n/v, cough, rash, dizziness, SOB, chest pain, palpitations, abdominal pain or changes in bowel or bladder habits.  No swelling, tenderness, numbness or tingling in her extremities.  No lymphadenopathy noted on exam.  She has maintained a good appetite and is staying well hydrated. Her weight is stable.   ECOG Performance Status: 0 - Asymptomatic  Medications:  Allergies as of 02/11/2019      Reactions   Simvastatin    Severe rhabdomyolysis.   Strawberry Extract Swelling   Face only. Breathing not affected.      Medication List       Accurate as of February 11, 2019  9:22 AM. Always use your most recent med list.        hydrochlorothiazide 25 MG tablet Commonly known as:  HYDRODIURIL 25 mg daily. Takes 1 tab in the morning   NONFORMULARY OR COMPOUNDED [place]: Scar Cream - Verapamil 10%, Pentoxifylline 5%, apply to affected area 3-4 times daily.   rivaroxaban 2.5 MG Tabs tablet Commonly known as:  XARELTO Take 2 tablets (5 mg total) by  mouth daily.   rosuvastatin 10 MG tablet Commonly known as:  CRESTOR       Allergies:  Allergies  Allergen Reactions  . Simvastatin     Severe rhabdomyolysis.  . Strawberry Extract Swelling    Face only. Breathing not affected.    Past Medical History, Surgical history, Social history, and Family History were reviewed and updated.  Review of Systems: All other 10 point review of systems is negative.   Physical Exam:  vitals were not taken for this visit.   Wt Readings from Last 3 Encounters:  08/13/18 186 lb (84.4 kg)  02/12/18 185 lb (83.9 kg)  10/16/17 184 lb (83.5 kg)    Ocular: Sclerae unicteric, pupils equal, round and reactive to light Ear-nose-throat: Oropharynx clear, dentition fair Lymphatic: No cervical, supraclavicular or axillary adenopathy Lungs no rales or rhonchi, good excursion bilaterally Heart regular rate and rhythm, no murmur appreciated Abd soft, nontender, positive bowel sounds, no liver or spleen tip palpated on exam, no fluid wave  MSK no focal spinal tenderness, no joint edema Neuro: non-focal, well-oriented, appropriate affect Breasts: Deferred   Lab Results  Component Value Date   WBC 4.0 02/11/2019   HGB 13.3 02/11/2019   HCT 38.9 02/11/2019   MCV 89.0 02/11/2019   PLT 161 02/11/2019   Lab Results  Component Value Date   FERRITIN 260 08/13/2018   IRON 74 08/13/2018   TIBC 378 08/13/2018  UIBC 304 08/13/2018   IRONPCTSAT 20 (L) 08/13/2018   Lab Results  Component Value Date   RETICCTPCT 1.2 03/01/2015   RBC 4.37 02/11/2019   RETICCTABS 52.1 03/01/2015   No results found for: KPAFRELGTCHN, LAMBDASER, KAPLAMBRATIO No results found for: IGGSERUM, IGA, IGMSERUM No results found for: Odetta Pink, SPEI   Chemistry      Component Value Date/Time   NA 145 08/13/2018 0820   NA 146 (H) 10/16/2017 0804   NA 141 03/13/2017 0807   K 4.3 08/13/2018 0820   K 3.8 10/16/2017 0804    K 3.9 03/13/2017 0807   CL 106 08/13/2018 0820   CL 106 10/16/2017 0804   CO2 31 08/13/2018 0820   CO2 29 10/16/2017 0804   CO2 27 03/13/2017 0807   BUN 14 08/13/2018 0820   BUN 10 10/16/2017 0804   BUN 12.4 03/13/2017 0807   CREATININE 0.85 08/13/2018 0820   CREATININE 1.0 10/16/2017 0804   CREATININE 0.8 03/13/2017 0807      Component Value Date/Time   CALCIUM 10.0 08/13/2018 0820   CALCIUM 9.3 10/16/2017 0804   CALCIUM 9.6 03/13/2017 0807   ALKPHOS 77 08/13/2018 0820   ALKPHOS 72 10/16/2017 0804   ALKPHOS 70 03/13/2017 0807   AST 20 08/13/2018 0820   AST 16 03/13/2017 0807   ALT 20 08/13/2018 0820   ALT 28 10/16/2017 0804   ALT 18 03/13/2017 0807   BILITOT 0.6 08/13/2018 0820   BILITOT 0.93 03/13/2017 0807       Impression and Plan: Mallory Morales is a very pleasant 63 yo African American female with history of stage II adenocarcinoma of the sigmoid colon. She had surgery in January 2013 and did not require adjuvant treatment. 36 lymph nodes were negative. Her oncotype score was 20.   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 Her hypercoag work-up was negative.  She will continue her same regimen with Xarelto 5 mg PO daily.  We will see what her iron studies show and bring her back in for infusion if needed.  We will plan to see her back in another 6 months.  She will contact our office with any questions or concerns. We can certainly see her sooner if need be.   Mallory Peace, NP 2/21/20209:22 AM

## 2019-02-24 DIAGNOSIS — Z1211 Encounter for screening for malignant neoplasm of colon: Secondary | ICD-10-CM | POA: Diagnosis not present

## 2019-02-24 DIAGNOSIS — D122 Benign neoplasm of ascending colon: Secondary | ICD-10-CM | POA: Diagnosis not present

## 2019-02-24 DIAGNOSIS — D123 Benign neoplasm of transverse colon: Secondary | ICD-10-CM | POA: Diagnosis not present

## 2019-02-24 DIAGNOSIS — K635 Polyp of colon: Secondary | ICD-10-CM | POA: Diagnosis not present

## 2019-02-24 DIAGNOSIS — D124 Benign neoplasm of descending colon: Secondary | ICD-10-CM | POA: Diagnosis not present

## 2019-02-24 DIAGNOSIS — D120 Benign neoplasm of cecum: Secondary | ICD-10-CM | POA: Diagnosis not present

## 2019-08-12 ENCOUNTER — Other Ambulatory Visit: Payer: Self-pay

## 2019-08-12 ENCOUNTER — Inpatient Hospital Stay (HOSPITAL_BASED_OUTPATIENT_CLINIC_OR_DEPARTMENT_OTHER): Payer: 59 | Admitting: Hematology & Oncology

## 2019-08-12 ENCOUNTER — Encounter: Payer: Self-pay | Admitting: Hematology & Oncology

## 2019-08-12 ENCOUNTER — Inpatient Hospital Stay: Payer: 59 | Attending: Hematology & Oncology

## 2019-08-12 VITALS — BP 148/92 | HR 80 | Temp 97.6°F | Resp 18 | Ht 66.0 in | Wt 192.4 lb

## 2019-08-12 DIAGNOSIS — I82413 Acute embolism and thrombosis of femoral vein, bilateral: Secondary | ICD-10-CM

## 2019-08-12 DIAGNOSIS — C187 Malignant neoplasm of sigmoid colon: Secondary | ICD-10-CM | POA: Diagnosis not present

## 2019-08-12 DIAGNOSIS — Z79899 Other long term (current) drug therapy: Secondary | ICD-10-CM | POA: Insufficient documentation

## 2019-08-12 DIAGNOSIS — Z86718 Personal history of other venous thrombosis and embolism: Secondary | ICD-10-CM | POA: Diagnosis not present

## 2019-08-12 DIAGNOSIS — Z7901 Long term (current) use of anticoagulants: Secondary | ICD-10-CM | POA: Insufficient documentation

## 2019-08-12 DIAGNOSIS — C186 Malignant neoplasm of descending colon: Secondary | ICD-10-CM

## 2019-08-12 DIAGNOSIS — I82411 Acute embolism and thrombosis of right femoral vein: Secondary | ICD-10-CM

## 2019-08-12 DIAGNOSIS — D500 Iron deficiency anemia secondary to blood loss (chronic): Secondary | ICD-10-CM

## 2019-08-12 LAB — CBC WITH DIFFERENTIAL (CANCER CENTER ONLY)
Abs Immature Granulocytes: 0.01 10*3/uL (ref 0.00–0.07)
Basophils Absolute: 0 10*3/uL (ref 0.0–0.1)
Basophils Relative: 1 %
Eosinophils Absolute: 0.1 10*3/uL (ref 0.0–0.5)
Eosinophils Relative: 3 %
HCT: 38.9 % (ref 36.0–46.0)
Hemoglobin: 13.5 g/dL (ref 12.0–15.0)
Immature Granulocytes: 0 %
Lymphocytes Relative: 42 %
Lymphs Abs: 1.8 10*3/uL (ref 0.7–4.0)
MCH: 31 pg (ref 26.0–34.0)
MCHC: 34.7 g/dL (ref 30.0–36.0)
MCV: 89.4 fL (ref 80.0–100.0)
Monocytes Absolute: 0.3 10*3/uL (ref 0.1–1.0)
Monocytes Relative: 7 %
Neutro Abs: 2.1 10*3/uL (ref 1.7–7.7)
Neutrophils Relative %: 47 %
Platelet Count: 149 10*3/uL — ABNORMAL LOW (ref 150–400)
RBC: 4.35 MIL/uL (ref 3.87–5.11)
RDW: 12.6 % (ref 11.5–15.5)
WBC Count: 4.3 10*3/uL (ref 4.0–10.5)
nRBC: 0 % (ref 0.0–0.2)

## 2019-08-12 LAB — CMP (CANCER CENTER ONLY)
ALT: 24 U/L (ref 0–44)
AST: 21 U/L (ref 15–41)
Albumin: 4.4 g/dL (ref 3.5–5.0)
Alkaline Phosphatase: 60 U/L (ref 38–126)
Anion gap: 9 (ref 5–15)
BUN: 13 mg/dL (ref 6–20)
CO2: 31 mmol/L (ref 22–32)
Calcium: 9 mg/dL (ref 8.9–10.3)
Chloride: 103 mmol/L (ref 98–111)
Creatinine: 0.86 mg/dL (ref 0.44–1.00)
GFR, Est AFR Am: >60 mL/min (ref >60)
GFR, Estimated: >60 mL/min (ref >60)
Glucose, Bld: 125 mg/dL — ABNORMAL HIGH (ref 70–99)
Potassium: 4.1 mmol/L (ref 3.5–5.1)
Sodium: 143 mmol/L (ref 135–145)
Total Bilirubin: 0.6 mg/dL (ref 0.3–1.2)
Total Protein: 7 g/dL (ref 6.5–8.1)

## 2019-08-12 LAB — IRON AND TIBC
Iron: 89 ug/dL (ref 41–142)
Saturation Ratios: 26 % (ref 21–57)
TIBC: 346 ug/dL (ref 236–444)
UIBC: 258 ug/dL (ref 120–384)

## 2019-08-12 LAB — CEA (IN HOUSE-CHCC): CEA (CHCC-In House): 4.46 ng/mL (ref 0.00–5.00)

## 2019-08-12 LAB — FERRITIN: Ferritin: 283 ng/mL (ref 11–307)

## 2019-08-12 NOTE — Progress Notes (Signed)
Hematology and Oncology Follow Up Visit  Mallory Morales:[id number] 1956/11/06 63 y.o. 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  So far, she is doing fairly well.  Unfortunately, she got furloughed from her job.  Hopefully she will be able to go back to work.  She really misses going on her cruises.  She had one plan for June.  She was goes over to [place].  She totally misses this.  Hopefully, she will be able to go on a cruise next April.  She is doing well on the Xarelto.  Having no problems with leg swelling.  She is having no bleeding.  Her last CEA level back in February was 4.59.  She has had no cough.  There is been no rashes.  She has had no headache.  She has had no obvious change in bowel or bladder habits.    Overall, her performance status is ECOG 0.    Medications:  Allergies as of 08/12/2019      Reactions   Simvastatin Other (See Comments)   Severe rhabdomyolysis.   Strawberry Extract Swelling   Face only. Breathing not affected.      Medication List       Accurate as of August 12, 2019  9:18 AM. If you have any questions, ask your nurse or doctor.        STOP taking these medications   rosuvastatin 10 MG tablet Commonly known as: CRESTOR Stopped by: [name], MD     TAKE these medications   fluvastatin XL 80 MG 24 hr tablet Commonly known as: LESCOL XL TAKE 1 TABLET BY MOUTH EVERY DAY FOR CHOLESTEROL   hydrochlorothiazide 25 MG tablet Commonly known as: HYDRODIURIL 25 mg daily. Takes 1 tab in the morning   NONFORMULARY OR COMPOUNDED [place]: Scar Cream - Verapamil 10%,  Pentoxifylline 5%, apply to affected area 3-4 times daily.   rivaroxaban 2.5 MG Tabs tablet Commonly known as: Xarelto Take 2 tablets (5 mg total) by mouth daily.       Allergies:  Allergies  Allergen Reactions  . Simvastatin Other (See Comments)    Severe rhabdomyolysis.  . Strawberry Extract Swelling    Face only. Breathing not affected.    Past Medical History, Surgical history, Social history, and Family History were reviewed and updated.  Review of Systems: Review of Systems  Constitutional: Negative.   HENT: Negative.   Eyes: Negative.   Respiratory: Negative.   Cardiovascular: Negative.   Gastrointestinal: Negative.   Genitourinary: Negative.   Musculoskeletal: Negative.   Skin: Negative.   Neurological: Negative.   Endo/Heme/Allergies: Negative.   Psychiatric/Behavioral: Negative.    Physical Exam:  height is 5\' 6"  (1.676 m) and weight is 192 lb 6.4 oz (87.3 kg). Her temporal temperature is 97.6 F (36.4 C). Her blood pressure is 148/92 (abnormal) and her pulse is 80. Her respiration is 18 and oxygen saturation is 100%.   Wt Readings from Last 3 Encounters:  08/12/19 192 lb 6.4 oz (87.3 kg)  08/13/18 186 lb (84.4 kg)  02/12/18 185 lb (83.9 kg)    Physical Exam Vitals signs  reviewed.  HENT:     Head: Normocephalic and atraumatic.  Eyes:     Pupils: Pupils are equal, round, and reactive to light.  Neck:     Musculoskeletal: Normal range of motion.  Cardiovascular:     Rate and Rhythm: Normal rate and regular rhythm.     Heart sounds: Normal heart sounds.  Pulmonary:     Effort: Pulmonary effort is normal.     Breath sounds: Normal breath sounds.  Abdominal:     General: Bowel sounds are normal.     Palpations: Abdomen is soft.  Musculoskeletal: Normal range of motion.        General: No tenderness or deformity.  Lymphadenopathy:     Cervical: No cervical adenopathy.  Skin:    General: Skin is warm and dry.     Findings: No erythema or rash.   Neurological:     Mental Status: She is alert and oriented to person, place, and time.  Psychiatric:        Behavior: Behavior normal.        Thought Content: Thought content normal.        Judgment: Judgment normal.     Lab Results  Component Value Date   WBC 4.0 02/11/2019   HGB 13.3 02/11/2019   HCT 38.9 02/11/2019   MCV 89.0 02/11/2019   PLT 161 02/11/2019   Lab Results  Component Value Date   FERRITIN 301 02/11/2019   IRON 62 02/11/2019   TIBC 348 02/11/2019   UIBC 285 02/11/2019   IRONPCTSAT 18 (L) 02/11/2019   Lab Results  Component Value Date   RETICCTPCT 1.2 03/01/2015   RBC 4.37 02/11/2019   RETICCTABS 52.1 03/01/2015   No results found for: KPAFRELGTCHN, LAMBDASER, KAPLAMBRATIO No results found for: IGGSERUM, IGA, IGMSERUM No results found for: Odetta Pink, SPEI   Chemistry      Component Value Date/Time   NA 141 02/11/2019 0901   NA 146 (H) 10/16/2017 0804   NA 141 03/13/2017 0807   K 4.1 02/11/2019 0901   K 3.8 10/16/2017 0804   K 3.9 03/13/2017 0807   CL 103 02/11/2019 0901   CL 106 10/16/2017 0804   CO2 30 02/11/2019 0901   CO2 29 10/16/2017 0804   CO2 27 03/13/2017 0807   BUN 13 02/11/2019 0901   BUN 10 10/16/2017 0804   BUN 12.4 03/13/2017 0807   CREATININE 0.90 02/11/2019 0901   CREATININE 1.0 10/16/2017 0804   CREATININE 0.8 03/13/2017 0807      Component Value Date/Time   CALCIUM 9.4 02/11/2019 0901   CALCIUM 9.3 10/16/2017 0804   CALCIUM 9.6 03/13/2017 0807   ALKPHOS 67 02/11/2019 0901   ALKPHOS 72 10/16/2017 0804   ALKPHOS 70 03/13/2017 0807   AST 17 02/11/2019 0901   AST 16 03/13/2017 0807   ALT 19 02/11/2019 0901   ALT 28 10/16/2017 0804   ALT 18 03/13/2017 0807   BILITOT 0.7 02/11/2019 0901   BILITOT 0.93 03/13/2017 0807      Impression and Plan: Mallory Morales is a lovely 63 yo African American female with history of stage II adenocarcinoma of the sigmoid colon.  Her  Oncotype score was 20.  She had 36 lymph nodes that were negative. She had her surgery in January of 2013 and did not require adjuvant treatment.   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  All of her hypercoagulable  studies were normal.   I will keep her on the 5 mg of Xarelto indefinitely.  Particularly, since she travels, I think this Xarelto will be helpful.  We will plan to have her come back in 6 more months.  We will try to see her back before she goes on her [place] vacation in April.   [name], MD 8/21/20209:18 AM

## 2019-08-15 ENCOUNTER — Telehealth: Payer: Self-pay | Admitting: *Deleted

## 2019-08-15 NOTE — Telephone Encounter (Signed)
As noted below by Dr. Marin Olp, I informed the patient of the iron and CEA tumor marker levels. She verbalized understanding.

## 2019-08-15 NOTE — Telephone Encounter (Signed)
-----   Message from [name], MD sent at 08/12/2019  5:29 PM EDT ----- Call - the iron and CEA tumor level looks great!  Mallory Morales

## 2019-09-29 ENCOUNTER — Other Ambulatory Visit: Payer: Self-pay | Admitting: Hematology & Oncology

## 2019-09-29 DIAGNOSIS — D501 Sideropenic dysphagia: Secondary | ICD-10-CM

## 2019-09-29 DIAGNOSIS — C186 Malignant neoplasm of descending colon: Secondary | ICD-10-CM

## 2019-09-29 DIAGNOSIS — I82413 Acute embolism and thrombosis of femoral vein, bilateral: Secondary | ICD-10-CM

## 2020-02-03 ENCOUNTER — Encounter: Payer: Self-pay | Admitting: *Deleted

## 2020-02-03 ENCOUNTER — Inpatient Hospital Stay: Payer: 59 | Attending: Hematology & Oncology | Admitting: Hematology & Oncology

## 2020-02-03 ENCOUNTER — Inpatient Hospital Stay: Payer: 59

## 2020-02-03 ENCOUNTER — Other Ambulatory Visit: Payer: Self-pay

## 2020-02-03 ENCOUNTER — Encounter: Payer: Self-pay | Admitting: Hematology & Oncology

## 2020-02-03 VITALS — BP 144/80 | HR 72 | Temp 96.4°F | Resp 18 | Wt 193.0 lb

## 2020-02-03 DIAGNOSIS — C186 Malignant neoplasm of descending colon: Secondary | ICD-10-CM

## 2020-02-03 DIAGNOSIS — I82411 Acute embolism and thrombosis of right femoral vein: Secondary | ICD-10-CM

## 2020-02-03 DIAGNOSIS — Z85038 Personal history of other malignant neoplasm of large intestine: Secondary | ICD-10-CM | POA: Insufficient documentation

## 2020-02-03 DIAGNOSIS — D500 Iron deficiency anemia secondary to blood loss (chronic): Secondary | ICD-10-CM | POA: Diagnosis not present

## 2020-02-03 DIAGNOSIS — Z7901 Long term (current) use of anticoagulants: Secondary | ICD-10-CM | POA: Diagnosis not present

## 2020-02-03 DIAGNOSIS — D509 Iron deficiency anemia, unspecified: Secondary | ICD-10-CM | POA: Insufficient documentation

## 2020-02-03 DIAGNOSIS — Z86718 Personal history of other venous thrombosis and embolism: Secondary | ICD-10-CM | POA: Diagnosis not present

## 2020-02-03 DIAGNOSIS — Z79899 Other long term (current) drug therapy: Secondary | ICD-10-CM | POA: Insufficient documentation

## 2020-02-03 LAB — LACTATE DEHYDROGENASE: LDH: 205 U/L — ABNORMAL HIGH (ref 98–192)

## 2020-02-03 LAB — CBC WITH DIFFERENTIAL (CANCER CENTER ONLY)
Abs Immature Granulocytes: 0.01 10*3/uL (ref 0.00–0.07)
Basophils Absolute: 0 10*3/uL (ref 0.0–0.1)
Basophils Relative: 1 %
Eosinophils Absolute: 0.1 10*3/uL (ref 0.0–0.5)
Eosinophils Relative: 2 %
HCT: 38.3 % (ref 36.0–46.0)
Hemoglobin: 13.2 g/dL (ref 12.0–15.0)
Immature Granulocytes: 0 %
Lymphocytes Relative: 46 %
Lymphs Abs: 2 10*3/uL (ref 0.7–4.0)
MCH: 31.1 pg (ref 26.0–34.0)
MCHC: 34.5 g/dL (ref 30.0–36.0)
MCV: 90.3 fL (ref 80.0–100.0)
Monocytes Absolute: 0.3 10*3/uL (ref 0.1–1.0)
Monocytes Relative: 7 %
Neutro Abs: 1.9 10*3/uL (ref 1.7–7.7)
Neutrophils Relative %: 44 %
Platelet Count: 127 10*3/uL — ABNORMAL LOW (ref 150–400)
RBC: 4.24 MIL/uL (ref 3.87–5.11)
RDW: 12.3 % (ref 11.5–15.5)
WBC Count: 4.3 10*3/uL (ref 4.0–10.5)
nRBC: 0 % (ref 0.0–0.2)

## 2020-02-03 LAB — CMP (CANCER CENTER ONLY)
ALT: 21 U/L (ref 0–44)
AST: 19 U/L (ref 15–41)
Albumin: 4.6 g/dL (ref 3.5–5.0)
Alkaline Phosphatase: 64 U/L (ref 38–126)
Anion gap: 7 (ref 5–15)
BUN: 14 mg/dL (ref 8–23)
CO2: 30 mmol/L (ref 22–32)
Calcium: 9.6 mg/dL (ref 8.9–10.3)
Chloride: 106 mmol/L (ref 98–111)
Creatinine: 0.91 mg/dL (ref 0.44–1.00)
GFR, Est AFR Am: >60 mL/min (ref >60)
GFR, Estimated: >60 mL/min (ref >60)
Glucose, Bld: 114 mg/dL — ABNORMAL HIGH (ref 70–99)
Potassium: 4.1 mmol/L (ref 3.5–5.1)
Sodium: 143 mmol/L (ref 135–145)
Total Bilirubin: 0.7 mg/dL (ref 0.3–1.2)
Total Protein: 6.8 g/dL (ref 6.5–8.1)

## 2020-02-03 LAB — IRON AND TIBC
Iron: 78 ug/dL (ref 41–142)
Saturation Ratios: 23 % (ref 21–57)
TIBC: 336 ug/dL (ref 236–444)
UIBC: 258 ug/dL (ref 120–384)

## 2020-02-03 LAB — CEA (IN HOUSE-CHCC): CEA (CHCC-In House): 4.29 ng/mL (ref 0.00–5.00)

## 2020-02-03 LAB — FERRITIN: Ferritin: 453 ng/mL — ABNORMAL HIGH (ref 11–307)

## 2020-02-03 NOTE — Progress Notes (Signed)
Hematology and Oncology Follow Up Visit  ALLISA JURY [id number] 1956-10-09 64 y.o. 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 Freiberg is here today for follow-up.  Everything seems to going pretty well with her.  Her main problem is that she is not able to go on her cruises.  She had a cruise set up for April for the [place].  This was canceled.  She will try to go on a cruise to the [place] in May 2022.  She is feels well.  She has had no problems since we last saw her 6 months ago.  She has done well over the holidays.  She has had no problems with fever.  There is no cough.  She wanted know if I thought the COVID vaccine was safe.  I told her that it was safe.  I had no problems when I had my vaccine.  She has had no change in bowel or bladder habits.  She has had no nausea or vomiting.  She is doing well on the Xarelto.  She is on 5 mg a day.  She has had no problems with bleeding.  Overall, her performance status is ECOG 0.    Medications:  Allergies as of 02/03/2020      Reactions   Simvastatin Other (See Comments)   Severe rhabdomyolysis.   Strawberry Extract Swelling   Face only. Breathing not affected.      Medication List       Accurate as of February 03, 2020  9:23 AM. If you have any questions, ask your nurse or doctor.        STOP taking these medications   NONFORMULARY OR COMPOUNDED ITEM Stopped by: [name], MD     TAKE these medications   fluvastatin XL 80 MG 24 hr tablet Commonly known as: LESCOL XL TAKE 1 TABLET BY MOUTH EVERY DAY FOR CHOLESTEROL   hydrochlorothiazide 25 MG tablet Commonly known as: HYDRODIURIL 25  mg daily. Takes 1 tab in the morning   lisinopril 10 MG tablet Commonly known as: ZESTRIL Take 10 mg by mouth daily.   Xarelto 2.5 MG Tabs tablet Generic drug: rivaroxaban TAKE 2 TABLETS BY MOUTH EVERY DAY       Allergies:  Allergies  Allergen Reactions  . Simvastatin Other (See Comments)    Severe rhabdomyolysis.  . Strawberry Extract Swelling    Face only. Breathing not affected.    Past Medical History, Surgical history, Social history, and Family History were reviewed and updated.  Review of Systems: Review of Systems  Constitutional: Negative.   HENT: Negative.   Eyes: Negative.   Respiratory: Negative.   Cardiovascular: Negative.   Gastrointestinal: Negative.   Genitourinary: Negative.   Musculoskeletal: Negative.   Skin: Negative.   Neurological: Negative.   Endo/Heme/Allergies: Negative.   Psychiatric/Behavioral: Negative.    Physical Exam:  weight is 193 lb (87.5 kg). Her temporal temperature is 96.4 F (35.8 C) (abnormal). Her blood pressure is 144/80 (abnormal) and her pulse is 72. Her respiration is 18 and oxygen saturation is 100%.   Wt Readings from Last 3 Encounters:  02/03/20 193 lb (87.5 kg)  08/12/19 192 lb  6.4 oz (87.3 kg)  08/13/18 186 lb (84.4 kg)    Physical Exam Vitals reviewed.  HENT:     Head: Normocephalic and atraumatic.  Eyes:     Pupils: Pupils are equal, round, and reactive to light.  Cardiovascular:     Rate and Rhythm: Normal rate and regular rhythm.     Heart sounds: Normal heart sounds.  Pulmonary:     Effort: Pulmonary effort is normal.     Breath sounds: Normal breath sounds.  Abdominal:     General: Bowel sounds are normal.     Palpations: Abdomen is soft.  Musculoskeletal:        General: No tenderness or deformity. Normal range of motion.     Cervical back: Normal range of motion.  Lymphadenopathy:     Cervical: No cervical adenopathy.  Skin:    General: Skin is warm and dry.     Findings: No erythema or  rash.  Neurological:     Mental Status: She is alert and oriented to person, place, and time.  Psychiatric:        Behavior: Behavior normal.        Thought Content: Thought content normal.        Judgment: Judgment normal.     Lab Results  Component Value Date   WBC 4.3 02/03/2020   HGB 13.2 02/03/2020   HCT 38.3 02/03/2020   MCV 90.3 02/03/2020   PLT 127 (L) 02/03/2020   Lab Results  Component Value Date   FERRITIN 283 08/12/2019   IRON 89 08/12/2019   TIBC 346 08/12/2019   UIBC 258 08/12/2019   IRONPCTSAT 26 08/12/2019   Lab Results  Component Value Date   RETICCTPCT 1.2 03/01/2015   RBC 4.24 02/03/2020   RETICCTABS 52.1 03/01/2015   No results found for: KPAFRELGTCHN, LAMBDASER, KAPLAMBRATIO No results found for: IGGSERUM, IGA, IGMSERUM No results found for: Odetta Pink, SPEI   Chemistry      Component Value Date/Time   NA 143 08/12/2019 0839   NA 146 (H) 10/16/2017 0804   NA 141 03/13/2017 0807   K 4.1 08/12/2019 0839   K 3.8 10/16/2017 0804   K 3.9 03/13/2017 0807   CL 103 08/12/2019 0839   CL 106 10/16/2017 0804   CO2 31 08/12/2019 0839   CO2 29 10/16/2017 0804   CO2 27 03/13/2017 0807   BUN 13 08/12/2019 0839   BUN 10 10/16/2017 0804   BUN 12.4 03/13/2017 0807   CREATININE 0.86 08/12/2019 0839   CREATININE 1.0 10/16/2017 0804   CREATININE 0.8 03/13/2017 0807      Component Value Date/Time   CALCIUM 9.0 08/12/2019 0839   CALCIUM 9.3 10/16/2017 0804   CALCIUM 9.6 03/13/2017 0807   ALKPHOS 60 08/12/2019 0839   ALKPHOS 72 10/16/2017 0804   ALKPHOS 70 03/13/2017 0807   AST 21 08/12/2019 0839   AST 16 03/13/2017 0807   ALT 24 08/12/2019 0839   ALT 28 10/16/2017 0804   ALT 18 03/13/2017 0807   BILITOT 0.6 08/12/2019 0839   BILITOT 0.93 03/13/2017 0807      Impression and Plan: Ms. Zumbo is a lovely 64 yo African American female with history of stage II adenocarcinoma of the sigmoid colon.   Her Oncotype score was 20.  She had 36 lymph nodes that were negative. She had her surgery in January of 2013 and did not require adjuvant treatment.   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  All of her hypercoagulable studies were normal.   I will keep her on the 5 mg of Xarelto indefinitely.  Particularly, since she travels, I think this Xarelto will be helpful.  We will plan to have her come back in 6 more months.  We will try to see her back before she goes on her [place] vacation in April.   [name], MD 2/12/20219:23 AM

## 2020-03-09 ENCOUNTER — Encounter: Payer: Self-pay | Admitting: Hematology & Oncology

## 2020-08-03 ENCOUNTER — Inpatient Hospital Stay (HOSPITAL_BASED_OUTPATIENT_CLINIC_OR_DEPARTMENT_OTHER): Payer: 59 | Admitting: Hematology & Oncology

## 2020-08-03 ENCOUNTER — Encounter: Payer: Self-pay | Admitting: Hematology & Oncology

## 2020-08-03 ENCOUNTER — Inpatient Hospital Stay: Payer: 59 | Attending: Hematology & Oncology

## 2020-08-03 ENCOUNTER — Other Ambulatory Visit: Payer: Self-pay

## 2020-08-03 VITALS — BP 155/97 | HR 84 | Temp 98.9°F | Resp 17 | Wt 190.8 lb

## 2020-08-03 DIAGNOSIS — Z7901 Long term (current) use of anticoagulants: Secondary | ICD-10-CM | POA: Insufficient documentation

## 2020-08-03 DIAGNOSIS — D509 Iron deficiency anemia, unspecified: Secondary | ICD-10-CM | POA: Diagnosis not present

## 2020-08-03 DIAGNOSIS — Z86718 Personal history of other venous thrombosis and embolism: Secondary | ICD-10-CM | POA: Insufficient documentation

## 2020-08-03 DIAGNOSIS — C186 Malignant neoplasm of descending colon: Secondary | ICD-10-CM

## 2020-08-03 DIAGNOSIS — Z79899 Other long term (current) drug therapy: Secondary | ICD-10-CM | POA: Diagnosis not present

## 2020-08-03 DIAGNOSIS — I82411 Acute embolism and thrombosis of right femoral vein: Secondary | ICD-10-CM

## 2020-08-03 DIAGNOSIS — D500 Iron deficiency anemia secondary to blood loss (chronic): Secondary | ICD-10-CM

## 2020-08-03 DIAGNOSIS — Z85038 Personal history of other malignant neoplasm of large intestine: Secondary | ICD-10-CM | POA: Diagnosis not present

## 2020-08-03 LAB — CMP (CANCER CENTER ONLY)
ALT: 32 U/L (ref 0–44)
AST: 56 U/L — ABNORMAL HIGH (ref 15–41)
Albumin: 4.4 g/dL (ref 3.5–5.0)
Alkaline Phosphatase: 67 U/L (ref 38–126)
Anion gap: 7 (ref 5–15)
BUN: 14 mg/dL (ref 8–23)
CO2: 31 mmol/L (ref 22–32)
Calcium: 9.7 mg/dL (ref 8.9–10.3)
Chloride: 103 mmol/L (ref 98–111)
Creatinine: 0.85 mg/dL (ref 0.44–1.00)
GFR, Est AFR Am: >60 mL/min (ref >60)
GFR, Estimated: >60 mL/min (ref >60)
Glucose, Bld: 127 mg/dL — ABNORMAL HIGH (ref 70–99)
Potassium: 4 mmol/L (ref 3.5–5.1)
Sodium: 141 mmol/L (ref 135–145)
Total Bilirubin: 0.7 mg/dL (ref 0.3–1.2)
Total Protein: 6.6 g/dL (ref 6.5–8.1)

## 2020-08-03 LAB — CBC WITH DIFFERENTIAL (CANCER CENTER ONLY)
Abs Immature Granulocytes: 0.02 10*3/uL (ref 0.00–0.07)
Basophils Absolute: 0 10*3/uL (ref 0.0–0.1)
Basophils Relative: 1 %
Eosinophils Absolute: 0.1 10*3/uL (ref 0.0–0.5)
Eosinophils Relative: 2 %
HCT: 38.2 % (ref 36.0–46.0)
Hemoglobin: 13.2 g/dL (ref 12.0–15.0)
Immature Granulocytes: 0 %
Lymphocytes Relative: 39 %
Lymphs Abs: 1.7 10*3/uL (ref 0.7–4.0)
MCH: 30.9 pg (ref 26.0–34.0)
MCHC: 34.6 g/dL (ref 30.0–36.0)
MCV: 89.5 fL (ref 80.0–100.0)
Monocytes Absolute: 0.3 10*3/uL (ref 0.1–1.0)
Monocytes Relative: 6 %
Neutro Abs: 2.4 10*3/uL (ref 1.7–7.7)
Neutrophils Relative %: 52 %
Platelet Count: 174 10*3/uL (ref 150–400)
RBC: 4.27 MIL/uL (ref 3.87–5.11)
RDW: 11.9 % (ref 11.5–15.5)
WBC Count: 4.5 10*3/uL (ref 4.0–10.5)
nRBC: 0 % (ref 0.0–0.2)

## 2020-08-03 LAB — IRON AND TIBC
Iron: 107 ug/dL (ref 41–142)
Saturation Ratios: 32 % (ref 21–57)
TIBC: 333 ug/dL (ref 236–444)
UIBC: 226 ug/dL (ref 120–384)

## 2020-08-03 LAB — CEA (IN HOUSE-CHCC): CEA (CHCC-In House): 4.15 ng/mL (ref 0.00–5.00)

## 2020-08-03 LAB — FERRITIN: Ferritin: 392 ng/mL — ABNORMAL HIGH (ref 11–307)

## 2020-08-03 NOTE — Progress Notes (Signed)
Hematology and Oncology Follow Up Visit  Mallory Morales [id number] 08/30/1956 64 y.o. 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  So far, she is doing quite well.  She is back to work.  She really enjoys going to work.  As always, the big goal for her is the [place] cruise next May.  She will be gone all month.  Hopefully, she will have the cruise set sail and not be affected by the coronavirus.  She has had no problems with the Xarelto.  She is on 5 mg a day and doing well with this.  There is no bleeding.  She has had no change in bowel or bladder habits.  She has had no leg swelling.  She has had no nausea or vomiting.  She has had no headache.  Her last CEA level was holding steady at 4.2.  Overall, her performance status is ECOG 0.    Medications:  Allergies as of 08/03/2020      Reactions   Simvastatin Other (See Comments)   Severe rhabdomyolysis.   Strawberry Extract Swelling   Face only. Breathing not affected.      Medication List       Accurate as of August 03, 2020  9:57 AM. If you have any questions, ask your nurse or doctor.        fluvastatin 40 MG capsule Commonly known as: LESCOL Take 40 mg by mouth at bedtime. Take 2 capsules by mouth daily What changed: Another medication with the same name was removed. Continue taking this medication, and follow the directions you see here. Changed by: [name], MD   hydrochlorothiazide 25 MG tablet Commonly known as: HYDRODIURIL 25 mg daily. Takes 1 tab in the morning   lisinopril 10 MG tablet Commonly known as: ZESTRIL Take 10 mg by mouth daily.     Xarelto 2.5 MG Tabs tablet Generic drug: rivaroxaban TAKE 2 TABLETS BY MOUTH EVERY DAY       Allergies:  Allergies  Allergen Reactions  . Simvastatin Other (See Comments)    Severe rhabdomyolysis.  . Strawberry Extract Swelling    Face only. Breathing not affected.    Past Medical History, Surgical history, Social history, and Family History were reviewed and updated.  Review of Systems: Review of Systems  Constitutional: Negative.   HENT: Negative.   Eyes: Negative.   Respiratory: Negative.   Cardiovascular: Negative.   Gastrointestinal: Negative.   Genitourinary: Negative.   Musculoskeletal: Negative.   Skin: Negative.   Neurological: Negative.   Endo/Heme/Allergies: Negative.   Psychiatric/Behavioral: Negative.    Physical Exam:  weight is 190 lb 12 oz (86.5 kg). Her oral temperature is 98.9 F (37.2 C). Her blood pressure is 155/97 (abnormal) and her pulse is 84. Her respiration is 17 and oxygen saturation is 100%.   Wt Readings from Last 3 Encounters:  08/03/20 190 lb 12 oz (86.5 kg)  02/03/20 193 lb (87.5 kg)  08/12/19 192 lb 6.4 oz (87.3 kg)    Physical Exam Vitals reviewed.  HENT:     Head: Normocephalic and atraumatic.  Eyes:     Pupils: Pupils  are equal, round, and reactive to light.  Cardiovascular:     Rate and Rhythm: Normal rate and regular rhythm.     Heart sounds: Normal heart sounds.  Pulmonary:     Effort: Pulmonary effort is normal.     Breath sounds: Normal breath sounds.  Abdominal:     General: Bowel sounds are normal.     Palpations: Abdomen is soft.  Musculoskeletal:        General: No tenderness or deformity. Normal range of motion.     Cervical back: Normal range of motion.  Lymphadenopathy:     Cervical: No cervical adenopathy.  Skin:    General: Skin is warm and dry.     Findings: No erythema or rash.  Neurological:     Mental Status: She is alert and oriented to person, place, and time.  Psychiatric:        Behavior:  Behavior normal.        Thought Content: Thought content normal.        Judgment: Judgment normal.     Lab Results  Component Value Date   WBC 4.5 08/03/2020   HGB 13.2 08/03/2020   HCT 38.2 08/03/2020   MCV 89.5 08/03/2020   PLT 174 08/03/2020   Lab Results  Component Value Date   FERRITIN 453 (H) 02/03/2020   IRON 78 02/03/2020   TIBC 336 02/03/2020   UIBC 258 02/03/2020   IRONPCTSAT 23 02/03/2020   Lab Results  Component Value Date   RETICCTPCT 1.2 03/01/2015   RBC 4.27 08/03/2020   RETICCTABS 52.1 03/01/2015   No results found for: KPAFRELGTCHN, LAMBDASER, KAPLAMBRATIO No results found for: IGGSERUM, IGA, IGMSERUM No results found for: Kathrynn Ducking, MSPIKE, SPEI   Chemistry      Component Value Date/Time   NA 141 08/03/2020 0858   NA 146 (H) 10/16/2017 0804   NA 141 03/13/2017 0807   K 4.0 08/03/2020 0858   K 3.8 10/16/2017 0804   K 3.9 03/13/2017 0807   CL 103 08/03/2020 0858   CL 106 10/16/2017 0804   CO2 31 08/03/2020 0858   CO2 29 10/16/2017 0804   CO2 27 03/13/2017 0807   BUN 14 08/03/2020 0858   BUN 10 10/16/2017 0804   BUN 12.4 03/13/2017 0807   CREATININE 0.85 08/03/2020 0858   CREATININE 1.0 10/16/2017 0804   CREATININE 0.8 03/13/2017 0807      Component Value Date/Time   CALCIUM 9.7 08/03/2020 0858   CALCIUM 9.3 10/16/2017 0804   CALCIUM 9.6 03/13/2017 0807   ALKPHOS 67 08/03/2020 0858   ALKPHOS 72 10/16/2017 0804   ALKPHOS 70 03/13/2017 0807   AST 56 (H) 08/03/2020 0858   AST 16 03/13/2017 0807   ALT 32 08/03/2020 0858   ALT 28 10/16/2017 0804   ALT 18 03/13/2017 0807   BILITOT 0.7 08/03/2020 0858   BILITOT 0.93 03/13/2017 0807      Impression and Plan: Mallory Morales is a lovely 65 yo African American female with history of stage II adenocarcinoma of the sigmoid colon.  Her Oncotype score was 20.  She had 36 lymph nodes that were negative. She had her surgery in January of 2013 and did not  require adjuvant treatment.   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  All of her hypercoagulable studies were normal.   I will keep her on the 5 mg of Xarelto indefinitely.  Particularly, since  she travels, I think this Xarelto will be helpful.  We will plan to have her come back in 6 more months.  I want to make sure we see her before she goes on her cruise in May.  Caryl Pina may increase the dose of Xarelto to 10 mg while she is on her cruise.  [name], MD 8/13/20219:57 AM

## 2020-12-03 ENCOUNTER — Other Ambulatory Visit (HOSPITAL_BASED_OUTPATIENT_CLINIC_OR_DEPARTMENT_OTHER): Payer: Self-pay | Admitting: Internal Medicine

## 2020-12-03 ENCOUNTER — Ambulatory Visit: Payer: 59 | Attending: Internal Medicine

## 2020-12-03 DIAGNOSIS — Z23 Encounter for immunization: Secondary | ICD-10-CM

## 2020-12-10 MED FILL — PFIZER-BIONTECH COVID-19 VA: 30 | 1 days supply | Qty: 0 | Fill #0

## 2020-12-28 ENCOUNTER — Other Ambulatory Visit: Payer: Self-pay | Admitting: Hematology & Oncology

## 2020-12-28 DIAGNOSIS — C186 Malignant neoplasm of descending colon: Secondary | ICD-10-CM

## 2020-12-28 DIAGNOSIS — D501 Sideropenic dysphagia: Secondary | ICD-10-CM

## 2020-12-28 DIAGNOSIS — I82413 Acute embolism and thrombosis of femoral vein, bilateral: Secondary | ICD-10-CM

## 2021-02-01 ENCOUNTER — Inpatient Hospital Stay: Payer: 59 | Attending: Hematology & Oncology | Admitting: Family

## 2021-02-01 ENCOUNTER — Inpatient Hospital Stay: Payer: 59

## 2021-02-01 ENCOUNTER — Telehealth: Payer: Self-pay | Admitting: Family

## 2021-02-01 ENCOUNTER — Encounter: Payer: Self-pay | Admitting: Family

## 2021-02-01 ENCOUNTER — Other Ambulatory Visit: Payer: Self-pay

## 2021-02-01 VITALS — BP 151/76 | HR 90 | Temp 98.5°F | Resp 18 | Ht 65.0 in | Wt 192.0 lb

## 2021-02-01 DIAGNOSIS — K922 Gastrointestinal hemorrhage, unspecified: Secondary | ICD-10-CM | POA: Diagnosis not present

## 2021-02-01 DIAGNOSIS — Z7901 Long term (current) use of anticoagulants: Secondary | ICD-10-CM | POA: Insufficient documentation

## 2021-02-01 DIAGNOSIS — I82621 Acute embolism and thrombosis of deep veins of right upper extremity: Secondary | ICD-10-CM | POA: Diagnosis not present

## 2021-02-01 DIAGNOSIS — Z86718 Personal history of other venous thrombosis and embolism: Secondary | ICD-10-CM | POA: Diagnosis not present

## 2021-02-01 DIAGNOSIS — D509 Iron deficiency anemia, unspecified: Secondary | ICD-10-CM | POA: Insufficient documentation

## 2021-02-01 DIAGNOSIS — D500 Iron deficiency anemia secondary to blood loss (chronic): Secondary | ICD-10-CM | POA: Diagnosis not present

## 2021-02-01 DIAGNOSIS — I82411 Acute embolism and thrombosis of right femoral vein: Secondary | ICD-10-CM

## 2021-02-01 DIAGNOSIS — C186 Malignant neoplasm of descending colon: Secondary | ICD-10-CM

## 2021-02-01 DIAGNOSIS — C187 Malignant neoplasm of sigmoid colon: Secondary | ICD-10-CM | POA: Diagnosis not present

## 2021-02-01 LAB — CBC WITH DIFFERENTIAL (CANCER CENTER ONLY)
Abs Immature Granulocytes: 0.01 10*3/uL (ref 0.00–0.07)
Basophils Absolute: 0 10*3/uL (ref 0.0–0.1)
Basophils Relative: 1 %
Eosinophils Absolute: 0.1 10*3/uL (ref 0.0–0.5)
Eosinophils Relative: 3 %
HCT: 37.4 % (ref 36.0–46.0)
Hemoglobin: 13.3 g/dL (ref 12.0–15.0)
Immature Granulocytes: 0 %
Lymphocytes Relative: 44 %
Lymphs Abs: 1.5 10*3/uL (ref 0.7–4.0)
MCH: 31.1 pg (ref 26.0–34.0)
MCHC: 35.6 g/dL (ref 30.0–36.0)
MCV: 87.6 fL (ref 80.0–100.0)
Monocytes Absolute: 0.2 10*3/uL (ref 0.1–1.0)
Monocytes Relative: 5 %
Neutro Abs: 1.6 10*3/uL — ABNORMAL LOW (ref 1.7–7.7)
Neutrophils Relative %: 47 %
Platelet Count: 151 10*3/uL (ref 150–400)
RBC: 4.27 MIL/uL (ref 3.87–5.11)
RDW: 11.9 % (ref 11.5–15.5)
WBC Count: 3.3 10*3/uL — ABNORMAL LOW (ref 4.0–10.5)
nRBC: 0 % (ref 0.0–0.2)

## 2021-02-01 LAB — CMP (CANCER CENTER ONLY)
ALT: 20 U/L (ref 0–44)
AST: 17 U/L (ref 15–41)
Albumin: 4.7 g/dL (ref 3.5–5.0)
Alkaline Phosphatase: 63 U/L (ref 38–126)
Anion gap: 8 (ref 5–15)
BUN: 13 mg/dL (ref 8–23)
CO2: 30 mmol/L (ref 22–32)
Calcium: 9.7 mg/dL (ref 8.9–10.3)
Chloride: 105 mmol/L (ref 98–111)
Creatinine: 0.79 mg/dL (ref 0.44–1.00)
GFR, Estimated: >60 mL/min (ref >60)
Glucose, Bld: 109 mg/dL — ABNORMAL HIGH (ref 70–99)
Potassium: 3.9 mmol/L (ref 3.5–5.1)
Sodium: 143 mmol/L (ref 135–145)
Total Bilirubin: 0.5 mg/dL (ref 0.3–1.2)
Total Protein: 6.8 g/dL (ref 6.5–8.1)

## 2021-02-01 LAB — CEA (IN HOUSE-CHCC): CEA (CHCC-In House): 4.31 ng/mL (ref 0.00–5.00)

## 2021-02-01 NOTE — Progress Notes (Signed)
Hematology and Oncology Follow Up Visit  Mallory Morales [id number] Apr 30, 1956 65 y.o. 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 She is doing well and has no complaints at this time.  She is so very excited for [place]! She has not been in 3 years so this is going to be a wonderful getaway for her and her friends.  CEA last August was stable at 4.15.  No blood loss, bruising or petechia on Xarelto. Today's result is pending.  No fever, chills, n/v, cough, rash, dizziness, SOB, chest pain, palpitations, abdominal pain or changes in bowel or bladder habits.  No swelling, tenderness, numbness or tingling in her extremities.  No falls or syncope.  She has maintained a good appetite and is staying well hydrated. Her weight is stable at 192 lbs.   ECOG Performance Status: 1 - Symptomatic but completely ambulatory  Medications:  Allergies as of 02/01/2021      Reactions   Simvastatin Other (See Comments)   Severe rhabdomyolysis.   Strawberry Extract Swelling   Face only. Breathing not affected.      Medication List       Accurate as of February 01, 2021  9:46 AM. If you have any questions, ask your nurse or doctor.        fluvastatin 40 MG capsule Commonly known as: LESCOL Take 40 mg by mouth at bedtime. Take 2 capsules by mouth daily   hydrochlorothiazide 25 MG tablet Commonly known as: HYDRODIURIL 25 mg daily. Takes 1 tab in the morning   lisinopril 10 MG tablet Commonly known as: ZESTRIL Take 10 mg by mouth daily.   Xarelto 2.5 MG Tabs tablet Generic drug: rivaroxaban TAKE 2 TABLETS BY MOUTH EVERY DAY        Allergies:  Allergies  Allergen Reactions  . Simvastatin Other (See Comments)    Severe rhabdomyolysis.  . Strawberry Extract Swelling    Face only. Breathing not affected.    Past Medical History, Surgical history, Social history, and Family History were reviewed and updated.  Review of Systems: All other 10 point review of systems is negative.   Physical Exam:  height is 5\' 5"  (1.651 m) and weight is 192 lb 0.6 oz (87.1 kg). Her oral temperature is 98.5 F (36.9 C). Her blood pressure is 151/76 (abnormal) and her pulse is 90. Her respiration is 18 and oxygen saturation is 100%.   Wt Readings from Last 3 Encounters:  02/01/21 192 lb 0.6 oz (87.1 kg)  08/03/20 190 lb 12 oz (86.5 kg)  02/03/20 193 lb (87.5 kg)    Ocular: Sclerae unicteric, pupils equal, round and reactive to light Ear-nose-throat: Oropharynx clear, dentition fair Lymphatic: No cervical or supraclavicular adenopathy Lungs no rales or rhonchi, good excursion bilaterally Heart regular rate and rhythm, no murmur appreciated Abd soft, nontender, positive bowel sounds MSK no focal spinal tenderness, no joint edema Neuro: non-focal, well-oriented, appropriate affect Breasts: Deferred   Lab Results  Component Value Date   WBC 3.3 (L) 02/01/2021   HGB 13.3 02/01/2021   HCT 37.4 02/01/2021   MCV 87.6 02/01/2021  PLT 151 02/01/2021   Lab Results  Component Value Date   FERRITIN 392 (H) 08/03/2020   IRON 107 08/03/2020   TIBC 333 08/03/2020   UIBC 226 08/03/2020   IRONPCTSAT 32 08/03/2020   Lab Results  Component Value Date   RETICCTPCT 1.2 03/01/2015   RBC 4.27 02/01/2021   RETICCTABS 52.1 03/01/2015   No results found for: KPAFRELGTCHN, LAMBDASER, KAPLAMBRATIO No results found for: IGGSERUM, IGA, IGMSERUM No results found for: Odetta Pink, SPEI   Chemistry      Component Value Date/Time   NA 143 02/01/2021 0903   NA 146 (H) 10/16/2017  0804   NA 141 03/13/2017 0807   K 3.9 02/01/2021 0903   K 3.8 10/16/2017 0804   K 3.9 03/13/2017 0807   CL 105 02/01/2021 0903   CL 106 10/16/2017 0804   CO2 30 02/01/2021 0903   CO2 29 10/16/2017 0804   CO2 27 03/13/2017 0807   BUN 13 02/01/2021 0903   BUN 10 10/16/2017 0804   BUN 12.4 03/13/2017 0807   CREATININE 0.79 02/01/2021 0903   CREATININE 1.0 10/16/2017 0804   CREATININE 0.8 03/13/2017 0807      Component Value Date/Time   CALCIUM 9.7 02/01/2021 0903   CALCIUM 9.3 10/16/2017 0804   CALCIUM 9.6 03/13/2017 0807   ALKPHOS 63 02/01/2021 0903   ALKPHOS 72 10/16/2017 0804   ALKPHOS 70 03/13/2017 0807   AST 17 02/01/2021 0903   AST 16 03/13/2017 0807   ALT 20 02/01/2021 0903   ALT 28 10/16/2017 0804   ALT 18 03/13/2017 0807   BILITOT 0.5 02/01/2021 0903   BILITOT 0.93 03/13/2017 0807       Impression and Plan: Mallory Morales is a very pleasant 65 yo African American female with history of stage II adenocarcinoma of the sigmoid colon.  Her Oncotype score was 20. She had surgery in January of 2013. She had 36 lymph nodes that were negative and did not require adjuvant treatment.  She continues to do well and there has been no evidence of recurrence.  She is tolerating maintenance Xarelto nicely and will remain on her same regimen.  Follow-up in 6 months.  She was encouraged to contact our office with any questions or concerns.   Laverna Peace, NP 2/11/20229:46 AM

## 2021-02-01 NOTE — Telephone Encounter (Signed)
Appointments scheduled calendar printed per 2/11 los

## 2021-07-26 ENCOUNTER — Encounter: Payer: Self-pay | Admitting: Family

## 2021-07-26 NOTE — Telephone Encounter (Signed)
Spoke with Mallory Morales who states the Xarelto should not be affecting this issue and that swelling can occur when there is injury to the inside of the cheek. If pt is having trouble breathing or an airway is blocked then she would recommend urgent care. Pt advised and states that her cheek does not hurt and it is not affecting her airway. Pt just wanted to make sure it was not due to the medication due to the amount of swelling that occurred from this injury, compared to when this has occurred before.

## 2021-08-02 ENCOUNTER — Telehealth: Payer: Self-pay | Admitting: *Deleted

## 2021-08-02 ENCOUNTER — Inpatient Hospital Stay: Payer: 59 | Attending: Hematology & Oncology

## 2021-08-02 ENCOUNTER — Other Ambulatory Visit: Payer: Self-pay

## 2021-08-02 ENCOUNTER — Inpatient Hospital Stay: Payer: 59 | Admitting: Hematology & Oncology

## 2021-08-02 ENCOUNTER — Encounter: Payer: Self-pay | Admitting: Hematology & Oncology

## 2021-08-02 VITALS — BP 118/71 | HR 80 | Temp 98.2°F | Resp 18 | Wt 190.0 lb

## 2021-08-02 DIAGNOSIS — D500 Iron deficiency anemia secondary to blood loss (chronic): Secondary | ICD-10-CM

## 2021-08-02 DIAGNOSIS — C187 Malignant neoplasm of sigmoid colon: Secondary | ICD-10-CM | POA: Diagnosis present

## 2021-08-02 DIAGNOSIS — Z7901 Long term (current) use of anticoagulants: Secondary | ICD-10-CM | POA: Insufficient documentation

## 2021-08-02 DIAGNOSIS — I82411 Acute embolism and thrombosis of right femoral vein: Secondary | ICD-10-CM

## 2021-08-02 DIAGNOSIS — Z79899 Other long term (current) drug therapy: Secondary | ICD-10-CM | POA: Diagnosis not present

## 2021-08-02 DIAGNOSIS — Z86718 Personal history of other venous thrombosis and embolism: Secondary | ICD-10-CM | POA: Insufficient documentation

## 2021-08-02 DIAGNOSIS — C186 Malignant neoplasm of descending colon: Secondary | ICD-10-CM

## 2021-08-02 LAB — CMP (CANCER CENTER ONLY)
ALT: 23 U/L (ref 0–44)
AST: 20 U/L (ref 15–41)
Albumin: 4.6 g/dL (ref 3.5–5.0)
Alkaline Phosphatase: 62 U/L (ref 38–126)
Anion gap: 11 (ref 5–15)
BUN: 12 mg/dL (ref 8–23)
CO2: 28 mmol/L (ref 22–32)
Calcium: 9.7 mg/dL (ref 8.9–10.3)
Chloride: 105 mmol/L (ref 98–111)
Creatinine: 0.85 mg/dL (ref 0.44–1.00)
GFR, Estimated: >60 mL/min (ref >60)
Glucose, Bld: 108 mg/dL — ABNORMAL HIGH (ref 70–99)
Potassium: 4.3 mmol/L (ref 3.5–5.1)
Sodium: 144 mmol/L (ref 135–145)
Total Bilirubin: 0.5 mg/dL (ref 0.3–1.2)
Total Protein: 6.7 g/dL (ref 6.5–8.1)

## 2021-08-02 LAB — CBC WITH DIFFERENTIAL (CANCER CENTER ONLY)
Abs Immature Granulocytes: 0 10*3/uL (ref 0.00–0.07)
Basophils Absolute: 0 10*3/uL (ref 0.0–0.1)
Basophils Relative: 1 %
Eosinophils Absolute: 0.1 10*3/uL (ref 0.0–0.5)
Eosinophils Relative: 3 %
HCT: 38.8 % (ref 36.0–46.0)
Hemoglobin: 13.8 g/dL (ref 12.0–15.0)
Immature Granulocytes: 0 %
Lymphocytes Relative: 51 %
Lymphs Abs: 2.1 10*3/uL (ref 0.7–4.0)
MCH: 31.6 pg (ref 26.0–34.0)
MCHC: 35.6 g/dL (ref 30.0–36.0)
MCV: 88.8 fL (ref 80.0–100.0)
Monocytes Absolute: 0.3 10*3/uL (ref 0.1–1.0)
Monocytes Relative: 6 %
Neutro Abs: 1.6 10*3/uL — ABNORMAL LOW (ref 1.7–7.7)
Neutrophils Relative %: 39 %
Platelet Count: 144 10*3/uL — ABNORMAL LOW (ref 150–400)
RBC: 4.37 MIL/uL (ref 3.87–5.11)
RDW: 12.1 % (ref 11.5–15.5)
WBC Count: 4.1 10*3/uL (ref 4.0–10.5)
nRBC: 0 % (ref 0.0–0.2)

## 2021-08-02 LAB — CEA (IN HOUSE-CHCC): CEA (CHCC-In House): 4.86 ng/mL (ref 0.00–5.00)

## 2021-08-02 NOTE — Progress Notes (Signed)
Hematology and Oncology Follow Up Visit  Mallory Morales [id number] 1956-07-22 65 y.o. 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  As always, she went on a cruise.  This is a big time cruise in the [place].  She had a wonderful time.  She really enjoyed it.  She will be calling back in April 2023.  Otherwise she is working.  She works quite a bit.  She works so that she can go on her cruises.  I am so happy that she can do both.  She has had no problems with the Xarelto.  She did have a little swelling of her gums when she bit her gum recently.  This seemed to go down after a couple of days.  She has had no abdominal pain.  There has been no change in bowel or bladder habits.  She has had no cough or shortness of breath.  She has avoided the coronavirus.  Her last CEA level back in February was 4.3.  Overall, I would say performance status is ECOG 0.    Medications:  Allergies as of 08/02/2021       Reactions   Simvastatin Other (See Comments)   Severe rhabdomyolysis.   Strawberry Extract Swelling   Face only. Breathing not affected.        Medication List        Accurate as of August 02, 2021  9:01 AM. If you have any questions, ask your nurse or doctor.          fluvastatin 40 MG capsule Commonly known as: LESCOL Take 40 mg by mouth at bedtime. Take 2 capsules by mouth daily   hydrochlorothiazide 25 MG tablet Commonly known as: HYDRODIURIL 25 mg daily. Takes 1 tab in the morning   lisinopril 10 MG tablet Commonly known as: ZESTRIL Take 10 mg by mouth daily.   Pfizer-BioNTech COVID-19 Vacc 30 MCG/0.3ML injection Generic  drug: COVID-19 mRNA vaccine (Pfizer) INJECT AS DIRECTED   Xarelto 2.5 MG Tabs tablet Generic drug: rivaroxaban TAKE 2 TABLETS BY MOUTH EVERY DAY        Allergies:  Allergies  Allergen Reactions   Simvastatin Other (See Comments)    Severe rhabdomyolysis.   Strawberry Extract Swelling    Face only. Breathing not affected.    Past Medical History, Surgical history, Social history, and Family History were reviewed and updated.  Review of Systems: Review of Systems  Constitutional: Negative.   HENT: Negative.    Eyes: Negative.   Respiratory: Negative.    Cardiovascular: Negative.   Gastrointestinal: Negative.   Genitourinary: Negative.   Musculoskeletal: Negative.   Skin: Negative.   Neurological: Negative.   Endo/Heme/Allergies: Negative.   Psychiatric/Behavioral: Negative.    Physical Exam:  weight is 190 lb (86.2 kg). Her oral temperature is 98.2 F (36.8 C). Her blood pressure is 118/71 and her pulse is 80. Her respiration is 18 and oxygen saturation is 98%.   Wt Readings from Last 3 Encounters:  08/02/21 190 lb (86.2 kg)  02/01/21 192 lb 0.6 oz (87.1 kg)  08/03/20 190 lb 12 oz (86.5 kg)    Physical Exam Vitals reviewed.  HENT:     Head: Normocephalic and atraumatic.  Eyes:     Pupils: Pupils are equal, round, and reactive to light.  Cardiovascular:     Rate and Rhythm: Normal rate and regular rhythm.     Heart sounds: Normal heart sounds.  Pulmonary:     Effort: Pulmonary effort is normal.     Breath sounds: Normal breath sounds.  Abdominal:     General: Bowel sounds are normal.     Palpations: Abdomen is soft.  Musculoskeletal:        General: No tenderness or deformity. Normal range of motion.     Cervical back: Normal range of motion.  Lymphadenopathy:     Cervical: No cervical adenopathy.  Skin:    General: Skin is warm and dry.     Findings: No erythema or rash.  Neurological:     Mental Status: She is alert and oriented to person, place,  and time.  Psychiatric:        Behavior: Behavior normal.        Thought Content: Thought content normal.        Judgment: Judgment normal.    Lab Results  Component Value Date   WBC 4.1 08/02/2021   HGB 13.8 08/02/2021   HCT 38.8 08/02/2021   MCV 88.8 08/02/2021   PLT 144 (L) 08/02/2021   Lab Results  Component Value Date   FERRITIN 392 (H) 08/03/2020   IRON 107 08/03/2020   TIBC 333 08/03/2020   UIBC 226 08/03/2020   IRONPCTSAT 32 08/03/2020   Lab Results  Component Value Date   RETICCTPCT 1.2 03/01/2015   RBC 4.37 08/02/2021   RETICCTABS 52.1 03/01/2015   No results found for: KPAFRELGTCHN, LAMBDASER, KAPLAMBRATIO No results found for: IGGSERUM, IGA, IGMSERUM No results found for: Odetta Pink, SPEI   Chemistry      Component Value Date/Time   NA 144 08/02/2021 0801   NA 146 (H) 10/16/2017 0804   NA 141 03/13/2017 0807   K 4.3 08/02/2021 0801   K 3.8 10/16/2017 0804   K 3.9 03/13/2017 0807   CL 105 08/02/2021 0801   CL 106 10/16/2017 0804   CO2 28 08/02/2021 0801   CO2 29 10/16/2017 0804   CO2 27 03/13/2017 0807   BUN 12 08/02/2021 0801   BUN 10 10/16/2017 0804   BUN 12.4 03/13/2017 0807   CREATININE 0.85 08/02/2021 0801   CREATININE 1.0 10/16/2017 0804   CREATININE 0.8 03/13/2017 0807      Component Value Date/Time   CALCIUM 9.7 08/02/2021 0801   CALCIUM 9.3 10/16/2017 0804   CALCIUM 9.6 03/13/2017 0807   ALKPHOS 62 08/02/2021 0801   ALKPHOS 72 10/16/2017 0804   ALKPHOS 70 03/13/2017 0807   AST 20 08/02/2021 0801   AST 16 03/13/2017 0807   ALT 23 08/02/2021 0801   ALT 28 10/16/2017 0804   ALT 18 03/13/2017 0807   BILITOT 0.5 08/02/2021 0801   BILITOT 0.93 03/13/2017 0807      Impression and Plan: Mallory Morales is a lovely 65 yo African American female with history of stage II adenocarcinoma of the sigmoid colon.  Her Oncotype score was 20.  She had 36 lymph nodes that were negative. She had  her surgery in January of 2013 and did not require adjuvant treatment.   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  All of her hypercoagulable studies were normal.  I will keep her on the 5 mg of Xarelto indefinitely.  Particularly, since she travels, I think this Xarelto will be helpful.  We will plan to have her come back in 6 more months.  I know we can get her through the holidays.  She was let us know if there is a problem.    [name], MD 8/12/20229:01 AM

## 2021-08-02 NOTE — Telephone Encounter (Signed)
Per 08/02/21 los gave upcoming appointments - confirmed - print calendar

## 2021-08-28 ENCOUNTER — Encounter: Payer: Self-pay | Admitting: Hematology & Oncology

## 2021-09-04 ENCOUNTER — Ambulatory Visit: Payer: 59 | Admitting: Podiatry

## 2021-09-04 ENCOUNTER — Ambulatory Visit (INDEPENDENT_AMBULATORY_CARE_PROVIDER_SITE_OTHER): Payer: 59

## 2021-09-04 ENCOUNTER — Other Ambulatory Visit: Payer: Self-pay

## 2021-09-04 DIAGNOSIS — R600 Localized edema: Secondary | ICD-10-CM

## 2021-09-04 NOTE — Progress Notes (Signed)
   HPI: 65 y.o. female presenting today as a new patient for evaluation of idiopathic swelling to the left foot and ankle for the past 1-2 months.  She denies a history of injury.  She does have history of DVT and is on anticoagulant.  She presents for further treatment evaluation  Past Medical History:  Diagnosis Date   Anemia, iron deficiency 01/27/2012   Clotting disorder ([place])    Colon cancer ([place])    T3N0M0, emergency surgery/with colostomy done-no further tx.   Complication of anesthesia    "hard time getting breath" post op   DVT (deep venous thrombosis) ([place])    Hyperlipemia    Hypertension    Pulmonary embolism ([place])    taking injections -last done 1'14(Arixtra)     Physical Exam: General: The patient is alert and oriented x3 in no acute distress.  Dermatology: Skin is warm, dry and supple bilateral lower extremities. Negative for open lesions or macerations.  There is some dark discoloration to the left foot with slight callus and hyperpigmentation along the dorsum of the foot.  Apparently the patient states that she was diagnosed with eczema in the past  Vascular: Palpable pedal pulses bilaterally.  Idiopathic edema noted left foot and ankle over the past 1-2 months.  Neurological: Epicritic and protective threshold grossly intact bilaterally.   Musculoskeletal Exam: Range of motion within normal limits to all pedal and ankle joints bilateral. Muscle strength 5/5 in all groups bilateral.   Radiographic Exam:  Normal osseous mineralization. Joint spaces preserved. No fracture/dislocation/boney destruction.    Assessment: 1.  Idiopathic edema left foot and ankle   Plan of Care:  1. Patient evaluated. X-Rays reviewed.  2.  Since the patient has a history of DVT we are going to order a venous Doppler to rule out DVT.  If the venous Doppler is negative, simply recommend compression daily until edema resolves 3.  Explained to the patient I am not quite sure why the patient  developed idiopathic edema other than concern for DVT. 4.  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      Edrick Kins, DPM [facility]  Dr. Edrick Kins, DPM    [address], [place] [address]                Office [phone]  Fax [phone]

## 2021-09-05 ENCOUNTER — Encounter: Payer: Self-pay | Admitting: Hematology & Oncology

## 2021-09-05 ENCOUNTER — Ambulatory Visit
Admission: RE | Admit: 2021-09-05 | Discharge: 2021-09-05 | Disposition: A | Discharge status: Home / Self Care | Payer: 59 | Source: Ambulatory Visit | Attending: Podiatry | Admitting: Podiatry

## 2021-09-05 DIAGNOSIS — R600 Localized edema: Secondary | ICD-10-CM

## 2021-11-25 DIAGNOSIS — Z0289 Encounter for other administrative examinations: Secondary | ICD-10-CM

## 2021-12-18 ENCOUNTER — Ambulatory Visit (INDEPENDENT_AMBULATORY_CARE_PROVIDER_SITE_OTHER): Payer: 59 | Admitting: Family Medicine

## 2021-12-18 ENCOUNTER — Other Ambulatory Visit: Payer: Self-pay

## 2021-12-18 ENCOUNTER — Encounter (INDEPENDENT_AMBULATORY_CARE_PROVIDER_SITE_OTHER): Payer: Self-pay | Admitting: Family Medicine

## 2021-12-18 VITALS — BP 136/80 | HR 86 | Temp 98.3°F | Ht 64.0 in | Wt 191.0 lb

## 2021-12-18 DIAGNOSIS — Z1331 Encounter for screening for depression: Secondary | ICD-10-CM | POA: Diagnosis not present

## 2021-12-18 DIAGNOSIS — R0602 Shortness of breath: Secondary | ICD-10-CM

## 2021-12-18 DIAGNOSIS — I10 Essential (primary) hypertension: Secondary | ICD-10-CM | POA: Diagnosis not present

## 2021-12-18 DIAGNOSIS — E669 Obesity, unspecified: Secondary | ICD-10-CM | POA: Diagnosis not present

## 2021-12-18 DIAGNOSIS — R5383 Other fatigue: Secondary | ICD-10-CM | POA: Diagnosis not present

## 2021-12-18 DIAGNOSIS — Z86711 Personal history of pulmonary embolism: Secondary | ICD-10-CM

## 2021-12-18 DIAGNOSIS — Z85038 Personal history of other malignant neoplasm of large intestine: Secondary | ICD-10-CM

## 2021-12-18 DIAGNOSIS — R7303 Prediabetes: Secondary | ICD-10-CM

## 2021-12-18 DIAGNOSIS — E785 Hyperlipidemia, unspecified: Secondary | ICD-10-CM

## 2021-12-18 DIAGNOSIS — Z9189 Other specified personal risk factors, not elsewhere classified: Secondary | ICD-10-CM

## 2021-12-19 LAB — COMPREHENSIVE METABOLIC PANEL
ALT: 23 IU/L (ref 0–32)
AST: 20 IU/L (ref 0–40)
Albumin/Globulin Ratio: 2.3 — ABNORMAL HIGH (ref 1.2–2.2)
Albumin: 4.8 g/dL (ref 3.8–4.8)
Alkaline Phosphatase: 76 IU/L (ref 44–121)
BUN/Creatinine Ratio: 15 (ref 12–28)
BUN: 12 mg/dL (ref 8–27)
Bilirubin Total: 0.7 mg/dL (ref 0.0–1.2)
CO2: 25 mmol/L (ref 20–29)
Calcium: 9.7 mg/dL (ref 8.7–10.3)
Chloride: 102 mmol/L (ref 96–106)
Creatinine, Ser: 0.78 mg/dL (ref 0.57–1.00)
Globulin, Total: 2.1 g/dL (ref 1.5–4.5)
Glucose: 125 mg/dL — ABNORMAL HIGH (ref 70–99)
Potassium: 4.4 mmol/L (ref 3.5–5.2)
Sodium: 141 mmol/L (ref 134–144)
Total Protein: 6.9 g/dL (ref 6.0–8.5)
eGFR: 85 mL/min/{1.73_m2} (ref >59)

## 2021-12-19 LAB — T4, FREE: Free T4: 1.1 ng/dL (ref 0.82–1.77)

## 2021-12-19 LAB — CBC WITH DIFFERENTIAL/PLATELET
Basophils Absolute: 0 10*3/uL (ref 0.0–0.2)
Basos: 1 %
EOS (ABSOLUTE): 0.1 10*3/uL (ref 0.0–0.4)
Eos: 2 %
Hematocrit: 38.6 % (ref 34.0–46.6)
Hemoglobin: 14 g/dL (ref 11.1–15.9)
Immature Grans (Abs): 0 10*3/uL (ref 0.0–0.1)
Immature Granulocytes: 0 %
Lymphocytes Absolute: 1.4 10*3/uL (ref 0.7–3.1)
Lymphs: 36 %
MCH: 31.3 pg (ref 26.6–33.0)
MCHC: 36.3 g/dL — ABNORMAL HIGH (ref 31.5–35.7)
MCV: 86 fL (ref 79–97)
Monocytes Absolute: 0.3 10*3/uL (ref 0.1–0.9)
Monocytes: 6 %
Neutrophils Absolute: 2.1 10*3/uL (ref 1.4–7.0)
Neutrophils: 55 %
Platelets: 135 10*3/uL — ABNORMAL LOW (ref 150–450)
RBC: 4.47 x10E6/uL (ref 3.77–5.28)
RDW: 12.7 % (ref 11.7–15.4)
WBC: 3.9 10*3/uL (ref 3.4–10.8)

## 2021-12-19 LAB — HEMOGLOBIN A1C
Est. average glucose Bld gHb Est-mCnc: 137 mg/dL
Hgb A1c MFr Bld: 6.4 % — ABNORMAL HIGH (ref 4.8–5.6)

## 2021-12-19 LAB — LIPID PANEL
Chol/HDL Ratio: 3.1 ratio (ref 0.0–4.4)
Cholesterol, Total: 262 mg/dL — ABNORMAL HIGH (ref 100–199)
HDL: 84 mg/dL (ref >39)
LDL Chol Calc (NIH): 165 mg/dL — ABNORMAL HIGH (ref 0–99)
Triglycerides: 79 mg/dL (ref 0–149)
VLDL Cholesterol Cal: 13 mg/dL (ref 5–40)

## 2021-12-19 LAB — T3, FREE: T3, Free: 3.4 pg/mL (ref 2.0–4.4)

## 2021-12-19 LAB — VITAMIN B12: Vitamin B-12: 1047 pg/mL (ref 232–1245)

## 2021-12-19 LAB — INSULIN, RANDOM: INSULIN: 18.3 u[IU]/mL (ref 2.6–24.9)

## 2021-12-19 LAB — VITAMIN D 25 HYDROXY (VIT D DEFICIENCY, FRACTURES): Vit D, 25-Hydroxy: 6.7 ng/mL — ABNORMAL LOW (ref 30.0–100.0)

## 2021-12-19 LAB — FOLATE: Folate: 9.8 ng/mL (ref >3.0)

## 2021-12-19 LAB — TSH: TSH: 1.27 u[IU]/mL (ref 0.450–4.500)

## 2021-12-19 NOTE — Progress Notes (Signed)
Chief Complaint:   OBESITY Mallory Morales (MR# [id number]) is a 65 y.o. female who presents for evaluation and treatment of obesity and related comorbidities. Current BMI is Body mass index is 32.79 kg/m. Mallory Morales has been struggling with her weight for many years and has been unsuccessful in either losing weight, maintaining weight loss, or reaching her healthy weight goal.  Mallory Morales is currently in the action stage of change and ready to dedicate time achieving and maintaining a healthier weight. Mallory Morales is interested in becoming our patient and working on intensive lifestyle modifications including (but not limited to) diet and exercise for weight loss.  Mallory Morales's habits were reviewed today and are as follows: her desired weight loss is 41 lbs, she started gaining weight at age 6, her heaviest weight ever was 195 pounds, she has significant food cravings issues, she is frequently drinking liquids with calories, she frequently makes poor food choices, she frequently eats larger portions than normal, and she struggles with emotional eating.  Depression Screen Mallory Morales's Food and Mood (modified PHQ-9) score was 5.  Depression screen PHQ 2/9 12/18/2021  Decreased Interest 1  Down, Depressed, Hopeless 1  PHQ - 2 Score 2  Altered sleeping 0  Tired, decreased energy 1  Change in appetite 0  Feeling bad or failure about yourself  1  Trouble concentrating 1  Moving slowly or fidgety/restless 0  Suicidal thoughts 0  PHQ-9 Score 5  Difficult doing work/chores Not difficult at all   Subjective:   1. Other fatigue Mallory Morales admits to daytime somnolence and denies waking up still tired. Patent has a history of symptoms of daytime fatigue. Mallory Morales generally gets 7 or 8 hours of sleep per night, and states that she has generally restful sleep. Snoring is present. Apneic episodes are not present. Epworth Sleepiness Score is 7.  2. SOB (shortness of breath) Mallory Morales notes increasing shortness  of breath with exercising and seems to be worsening over time with weight gain. She notes getting out of breath sooner with activity than she used to. This has not gotten worse recently. Mallory Morales denies shortness of breath at rest or orthopnea.  3. Pre-diabetes Mallory Morales has a history of pre-diabetes and she notes polyphagia.  4. Essential hypertention Mallory Morales's blood pressure is stable on her medications. She is ready to work on diet, exercise, and weight loss.  5. Hyperlipidemia, unspecified hyperlipidemia type Mallory Morales is not on statin, and she is ready to work on diet and exercise.  6. History of pulmonary embolus (PE) Mallory Morales has a history of pulmonary embolus with DVT after colon cancer surgery.   7. History of colon cancer Mallory Morales was diagnosed in 2013, and had a colostomy reversal in 2014. She has no GI issues, and she has a colonoscopy every 5 years. She may be due for one soon.  8. At risk for diabetes mellitus Mallory Morales is at higher than average risk for developing diabetes due to obesity.   Assessment/Plan:   1. Other fatigue Mallory Morales does feel that her weight is causing her energy to be lower than it should be. Fatigue may be related to obesity, depression or many other causes. Labs will be ordered, and in the meanwhile, Mallory Morales will focus on self care including making healthy food choices, increasing physical activity and focusing on stress reduction.  - CBC with Differential/Platelet - EKG 12-Lead - Folate - T3, free - T4, free - TSH - Vitamin B12 - VITAMIN D 25 Hydroxy (Vit-D Deficiency, Fractures)  2. SOB (  shortness of breath) Mallory Morales does feel that she gets out of breath more easily that she used to when she exercises. Mallory Morales's shortness of breath appears to be obesity related and exercise induced. She has agreed to work on weight loss and gradually increase exercise to treat her exercise induced shortness of breath. Will continue to monitor closely.  3.  Pre-diabetes We will check labs today. Mallory Morales will start her Category 2 plan, and will continue to work on weight loss, exercise, and decreasing simple carbohydrates to help decrease the risk of diabetes.   - Hemoglobin A1c - Insulin, random  4. Essential hypertention We will check labs today. Mallory Morales will work on healthy weight loss and exercise to improve blood pressure control. We will watch for signs of hypotension as she continues her lifestyle modifications.  - Comprehensive metabolic panel  5. Hyperlipidemia, unspecified hyperlipidemia type Cardiovascular risk and specific lipid/LDL goals reviewed.  We discussed several lifestyle modifications today. We will check labs today. Mallory Morales will continue to work on diet, exercise and weight loss efforts. Orders and follow up as documented in patient record.   - Lipid panel  6. History of pulmonary embolus (PE) We will continue to monitor and factor this history into our treatment plan.  7. History of colon cancer We will continue to monitor, and will factor this into our medical decision making.  8. Screening for depression Mallory Morales had a positive depression screening. Depression is commonly associated with obesity and often results in emotional eating behaviors. We will monitor this closely and work on CBT to help improve the non-hunger eating patterns. Referral to Psychology may be required if no improvement is seen as she continues in our clinic.  9. At risk for diabetes mellitus Mallory Morales was given approximately 30 minutes of diabetes education and counseling today. We discussed intensive lifestyle modifications today with an emphasis on weight loss as well as increasing exercise and decreasing simple carbohydrates in her diet. We also reviewed medication options with an emphasis on risk versus benefit of those discussed.   Repetitive spaced learning was employed today to elicit superior memory formation and behavioral change.  10.  Current BMI 32.9 Mallory Morales is currently in the action stage of change and her goal is to continue with weight loss efforts. I recommend Mallory Morales begin the structured treatment plan as follows:  She has agreed to the Category 2 Plan + 100 calories.  Exercise goals: No exercise has been prescribed for now, while we concentrate on nutritional changes.  Behavioral modification strategies: no skipping meals and meal planning and cooking strategies.  She was informed of the importance of frequent follow-up visits to maximize her success with intensive lifestyle modifications for her multiple health conditions. She was informed we would discuss her lab results at her next visit unless there is a critical issue that needs to be addressed sooner. Mallory Morales agreed to keep her next visit at the agreed upon time to discuss these results.  Objective:   Blood pressure 136/80, pulse 86, temperature 98.3 F (36.8 C), height 5\' 4"  (1.626 m), weight 191 lb (86.6 kg), SpO2 100 %. Body mass index is 32.79 kg/m.  EKG: Normal sinus rhythm, rate 83 BPM.  Indirect Calorimeter completed today shows a VO2 of 272 and a REE of 1872.  Her calculated basal metabolic rate is 7124 thus her basal metabolic rate is better than expected.  General: Cooperative, alert, well developed, in no acute distress. HEENT: Conjunctivae and lids unremarkable. Cardiovascular: Regular rhythm.  Lungs: Normal  work of breathing. Neurologic: No focal deficits.   Lab Results  Component Value Date   CREATININE 0.78 12/18/2021   BUN 12 12/18/2021   NA 141 12/18/2021   K 4.4 12/18/2021   CL 102 12/18/2021   CO2 25 12/18/2021   Lab Results  Component Value Date   ALT 23 12/18/2021   AST 20 12/18/2021   ALKPHOS 76 12/18/2021   BILITOT 0.7 12/18/2021   Lab Results  Component Value Date   HGBA1C 6.4 (H) 12/18/2021   Lab Results  Component Value Date   INSULIN 18.3 12/18/2021   Lab Results  Component Value Date   TSH 1.270  12/18/2021   Lab Results  Component Value Date   CHOL 262 (H) 12/18/2021   HDL 84 12/18/2021   LDLCALC 165 (H) 12/18/2021   TRIG 79 12/18/2021   CHOLHDL 3.1 12/18/2021   Lab Results  Component Value Date   WBC 3.9 12/18/2021   HGB 14.0 12/18/2021   HCT 38.6 12/18/2021   MCV 86 12/18/2021   PLT 135 (L) 12/18/2021   Lab Results  Component Value Date   IRON 107 08/03/2020   TIBC 333 08/03/2020   FERRITIN 392 (H) 08/03/2020   Attestation Statements:   Reviewed by clinician on day of visit: allergies, medications, problem list, medical history, surgical history, family history, social history, and previous encounter notes.   I, Trixie Dredge, am acting as transcriptionist for Dennard Nip, MD.  I have reviewed the above documentation for accuracy and completeness, and I agree with the above. - Dennard Nip, MD

## 2021-12-24 ENCOUNTER — Encounter (INDEPENDENT_AMBULATORY_CARE_PROVIDER_SITE_OTHER): Payer: Self-pay | Admitting: Family Medicine

## 2021-12-25 NOTE — Telephone Encounter (Signed)
Please see message and advise.  Thank you. ° °

## 2022-01-01 ENCOUNTER — Ambulatory Visit (INDEPENDENT_AMBULATORY_CARE_PROVIDER_SITE_OTHER): Payer: 59 | Admitting: Family Medicine

## 2022-01-01 ENCOUNTER — Other Ambulatory Visit: Payer: Self-pay

## 2022-01-01 ENCOUNTER — Encounter (INDEPENDENT_AMBULATORY_CARE_PROVIDER_SITE_OTHER): Payer: Self-pay | Admitting: Family Medicine

## 2022-01-01 VITALS — BP 119/73 | HR 79 | Temp 98.0°F | Ht 64.0 in | Wt 189.0 lb

## 2022-01-01 DIAGNOSIS — Z6832 Body mass index (BMI) 32.0-32.9, adult: Secondary | ICD-10-CM

## 2022-01-01 DIAGNOSIS — R7303 Prediabetes: Secondary | ICD-10-CM | POA: Diagnosis not present

## 2022-01-01 DIAGNOSIS — E559 Vitamin D deficiency, unspecified: Secondary | ICD-10-CM | POA: Diagnosis not present

## 2022-01-01 DIAGNOSIS — E669 Obesity, unspecified: Secondary | ICD-10-CM | POA: Diagnosis not present

## 2022-01-01 DIAGNOSIS — Z9189 Other specified personal risk factors, not elsewhere classified: Secondary | ICD-10-CM

## 2022-01-01 MED ORDER — METFORMIN HCL 500 MG PO TABS: 500.0000 mg | ORAL_TABLET | Freq: Every day | ORAL | 0 refills | Status: DC

## 2022-01-01 MED ORDER — VITAMIN D (ERGOCALCIFEROL) 1.25 MG (50000 UNIT) PO CAPS: 50000.0000 [IU] | ORAL_CAPSULE | ORAL | 0 refills | Status: DC

## 2022-01-02 NOTE — Progress Notes (Signed)
Chief Complaint:   OBESITY Mallory Morales is here to discuss her progress with her obesity treatment plan along with follow-up of her obesity related diagnoses. Mallory Morales is on the Category 2 Plan + 100 calories and states she is following her eating plan approximately 80% of the time. Mallory Morales states she is doing 0 minutes 0 times per week.  Today's visit was #: 2 Starting weight: 191 lbs Starting date: 12/18/2021 Today's weight: 189 lbs Today's date: 01/01/2022 Total lbs lost to date: 2 Total lbs lost since last in-office visit: 2  Interim History: Mallory Morales has done well with weight loss even over New Years. She made some deviations to her plan, but she followed it most of the time.  Subjective:   1. Vitamin D deficiency Mallory Morales's Vit D level is especially low. She started OTC Vit D last week.  2. Pre-diabetes Mallory Morales's A1c was very close to diabetes mellitus, and she notes polyphagia. She has done well with her Category 2 plan.   3. At risk for diabetes mellitus Mallory Morales is at higher than average risk for developing diabetes due to obesity.   Assessment/Plan:   1. Vitamin D deficiency Low Vitamin D level contributes to fatigue and are associated with obesity, breast, and colon cancer. Glynda agreed to start prescription Vitamin D 50,000 IU every week with no refills. She will follow-up for routine testing of Vitamin D, at least 2-3 times per year to avoid over-replacement.  - Vitamin D, Ergocalciferol, (DRISDOL) 1.25 MG (50000 UNIT) CAPS capsule; Take 1 capsule (50,000 Units total) by mouth every 7 (seven) days.  Dispense: 4 capsule; Refill: 0  2. Pre-diabetes Mallory Morales agreed to start metformin 500 mg q Am with no refills. She will continue diet, exercise, and decreasing simple carbohydrates to help decrease the risk of diabetes.   - metFORMIN (GLUCOPHAGE) 500 MG tablet; Take 1 tablet (500 mg total) by mouth daily with breakfast.  Dispense: 30 tablet; Refill: 0  3. At risk for  diabetes mellitus Mallory Morales was given approximately 30 minutes of diabetes education and counseling today. We discussed intensive lifestyle modifications today with an emphasis on weight loss as well as increasing exercise and decreasing simple carbohydrates in her diet. We also reviewed medication options with an emphasis on risk versus benefit of those discussed.   Repetitive spaced learning was employed today to elicit superior memory formation and behavioral change.  4. Obesity wit Current BMI of 32.5 Mallory Morales is currently in the action stage of change. As such, her goal is to continue with weight loss efforts. She has agreed to the Category 2 Plan + 100 calories.   Behavioral modification strategies: increasing lean protein intake.  Mallory Morales has agreed to follow-up with our clinic in 2 weeks. She was informed of the importance of frequent follow-up visits to maximize her success with intensive lifestyle modifications for her multiple health conditions.   Objective:   Blood pressure 119/73, pulse 79, temperature 98 F (36.7 C), temperature source Oral, height 5\' 4"  (1.626 m), weight 189 lb (85.7 kg), SpO2 99 %. Body mass index is 32.44 kg/m.  General: Cooperative, alert, well developed, in no acute distress. HEENT: Conjunctivae and lids unremarkable. Cardiovascular: Regular rhythm.  Lungs: Normal work of breathing. Neurologic: No focal deficits.   Lab Results  Component Value Date   CREATININE 0.78 12/18/2021   BUN 12 12/18/2021   NA 141 12/18/2021   K 4.4 12/18/2021   CL 102 12/18/2021   CO2 25 12/18/2021   Lab Results  Component Value Date   ALT 23 12/18/2021   AST 20 12/18/2021   ALKPHOS 76 12/18/2021   BILITOT 0.7 12/18/2021   Lab Results  Component Value Date   HGBA1C 6.4 (H) 12/18/2021   Lab Results  Component Value Date   INSULIN 18.3 12/18/2021   Lab Results  Component Value Date   TSH 1.270 12/18/2021   Lab Results  Component Value Date   CHOL 262 (H)  12/18/2021   HDL 84 12/18/2021   LDLCALC 165 (H) 12/18/2021   TRIG 79 12/18/2021   CHOLHDL 3.1 12/18/2021   Lab Results  Component Value Date   VD25OH 6.7 (L) 12/18/2021   Lab Results  Component Value Date   WBC 3.9 12/18/2021   HGB 14.0 12/18/2021   HCT 38.6 12/18/2021   MCV 86 12/18/2021   PLT 135 (L) 12/18/2021   Lab Results  Component Value Date   IRON 107 08/03/2020   TIBC 333 08/03/2020   FERRITIN 392 (H) 08/03/2020   Attestation Statements:   Reviewed by clinician on day of visit: allergies, medications, problem list, medical history, surgical history, family history, social history, and previous encounter notes.   I, Trixie Dredge, am acting as transcriptionist for Dennard Nip, MD.  I have reviewed the above documentation for accuracy and completeness, and I agree with the above. -  Dennard Nip, MD

## 2022-01-16 ENCOUNTER — Encounter (INDEPENDENT_AMBULATORY_CARE_PROVIDER_SITE_OTHER): Payer: Self-pay | Admitting: Physician Assistant

## 2022-01-16 ENCOUNTER — Ambulatory Visit (INDEPENDENT_AMBULATORY_CARE_PROVIDER_SITE_OTHER): Payer: 59 | Admitting: Physician Assistant

## 2022-01-16 ENCOUNTER — Other Ambulatory Visit: Payer: Self-pay

## 2022-01-16 VITALS — BP 143/88 | HR 78 | Temp 98.2°F | Ht 64.0 in | Wt 187.0 lb

## 2022-01-16 DIAGNOSIS — R7303 Prediabetes: Secondary | ICD-10-CM | POA: Diagnosis not present

## 2022-01-16 DIAGNOSIS — E669 Obesity, unspecified: Secondary | ICD-10-CM

## 2022-01-16 DIAGNOSIS — E559 Vitamin D deficiency, unspecified: Secondary | ICD-10-CM | POA: Diagnosis not present

## 2022-01-16 DIAGNOSIS — Z6832 Body mass index (BMI) 32.0-32.9, adult: Secondary | ICD-10-CM

## 2022-01-16 DIAGNOSIS — Z9189 Other specified personal risk factors, not elsewhere classified: Secondary | ICD-10-CM

## 2022-01-16 MED ORDER — VITAMIN D (ERGOCALCIFEROL) 1.25 MG (50000 UNIT) PO CAPS: 50000.0000 [IU] | ORAL_CAPSULE | ORAL | 0 refills | Status: DC

## 2022-01-16 MED ORDER — METFORMIN HCL 500 MG PO TABS: 500.0000 mg | ORAL_TABLET | Freq: Every day | ORAL | 0 refills | Status: DC

## 2022-01-16 NOTE — Progress Notes (Signed)
Chief Complaint:   OBESITY Mallory Morales is here to discuss her progress with her obesity treatment plan along with follow-up of her obesity related diagnoses. Mallory Morales is on the Category 2 Plan plus 100 calories and states she is following her eating plan approximately 80% of the time. Mallory Morales states she is doing 0 minutes 0 times per week.  Today's visit was #: 3 Starting weight: 191 lbs Starting date: 12/18/2021 Today's weight: 187 lbs Today's date: 01/16/2022 Total lbs lost to date: 4 lbs Total lbs lost since last in-office visit: 2 lbs  Interim History: Mallory Morales did well with weight loss. She has a hard time eating all of the protein at dinner as she states she is not a "big meat eater". Protein equivalent sheet given and discussed today.   Subjective:   1. Pre-diabetes Mallory Morales's last A1C was 6.4. She is on Metformin daily.   2. Vitamin D deficiency Mallory Morales is currently on Vitamin D and tolerating it well.  3. At risk for diabetes mellitus Mallory Morales is at higher than average risk for developing diabetes due to obesity.    Assessment/Plan:   1. Pre-diabetes We will refill Metformin 500 mg for 1 month with no refills. Mallory Morales will continue to work on weight loss, exercise, and decreasing simple carbohydrates to help decrease the risk of diabetes.   - metFORMIN (GLUCOPHAGE) 500 MG tablet; Take 1 tablet (500 mg total) by mouth daily with breakfast.  Dispense: 30 tablet; Refill: 0  2. Vitamin D deficiency Low Vitamin D level contributes to fatigue and are associated with obesity, breast, and colon cancer. We will refill prescription Vitamin D 50,000 IU every week for 1 month with no refills and Mallory Morales will follow-up for routine testing of Vitamin D, at least 2-3 times per year to avoid over-replacement.  - Vitamin D, Ergocalciferol, (DRISDOL) 1.25 MG (50000 UNIT) CAPS capsule; Take 1 capsule (50,000 Units total) by mouth every 7 (seven) days.  Dispense: 4 capsule; Refill: 0  3.  At risk for diabetes mellitus Mallory Morales was given approximately 15 minutes of diabetes education and counseling today. We discussed intensive lifestyle modifications today with an emphasis on weight loss as well as increasing exercise and decreasing simple carbohydrates in her diet. We also reviewed medication options with an emphasis on risk versus benefit of those discussed.   Repetitive spaced learning was employed today to elicit superior memory formation and behavioral change.   4. Obesity wit Current BMI of 32.08 Mallory Morales is currently in the action stage of change. As such, her goal is to continue with weight loss efforts. She has agreed to the Category 2 Plan plus 100 calories.   Exercise goals: No exercise has been prescribed at this time.  Behavioral modification strategies: meal planning and cooking strategies and keeping healthy foods in the home.  Mallory Morales has agreed to follow-up with our clinic in 2 weeks. She was informed of the importance of frequent follow-up visits to maximize her success with intensive lifestyle modifications for her multiple health conditions.   Objective:   Blood pressure (!) 143/88, pulse 78, temperature 98.2 F (36.8 C), height 5\' 4"  (1.626 m), weight 187 lb (84.8 kg), SpO2 100 %. Body mass index is 32.1 kg/m.  General: Cooperative, alert, well developed, in no acute distress. HEENT: Conjunctivae and lids unremarkable. Cardiovascular: Regular rhythm.  Lungs: Normal work of breathing. Neurologic: No focal deficits.   Lab Results  Component Value Date   CREATININE 0.78 12/18/2021   BUN 12 12/18/2021  NA 141 12/18/2021   K 4.4 12/18/2021   CL 102 12/18/2021   CO2 25 12/18/2021   Lab Results  Component Value Date   ALT 23 12/18/2021   AST 20 12/18/2021   ALKPHOS 76 12/18/2021   BILITOT 0.7 12/18/2021   Lab Results  Component Value Date   HGBA1C 6.4 (H) 12/18/2021   Lab Results  Component Value Date   INSULIN 18.3 12/18/2021   Lab  Results  Component Value Date   TSH 1.270 12/18/2021   Lab Results  Component Value Date   CHOL 262 (H) 12/18/2021   HDL 84 12/18/2021   LDLCALC 165 (H) 12/18/2021   TRIG 79 12/18/2021   CHOLHDL 3.1 12/18/2021   Lab Results  Component Value Date   VD25OH 6.7 (L) 12/18/2021   Lab Results  Component Value Date   WBC 3.9 12/18/2021   HGB 14.0 12/18/2021   HCT 38.6 12/18/2021   MCV 86 12/18/2021   PLT 135 (L) 12/18/2021   Lab Results  Component Value Date   IRON 107 08/03/2020   TIBC 333 08/03/2020   FERRITIN 392 (H) 08/03/2020   Attestation Statements:   Reviewed by clinician on day of visit: allergies, medications, problem list, medical history, surgical history, family history, social history, and previous encounter notes.  I, Tonye Pearson, am acting as Location manager for [employer], PA-C.  I have reviewed the above documentation for accuracy and completeness, and I agree with the above. Abby Potash, PA-C

## 2022-01-30 ENCOUNTER — Encounter (INDEPENDENT_AMBULATORY_CARE_PROVIDER_SITE_OTHER): Payer: Self-pay | Admitting: Family Medicine

## 2022-01-30 ENCOUNTER — Other Ambulatory Visit: Payer: Self-pay

## 2022-01-30 ENCOUNTER — Ambulatory Visit (INDEPENDENT_AMBULATORY_CARE_PROVIDER_SITE_OTHER): Payer: 59 | Admitting: Family Medicine

## 2022-01-30 VITALS — BP 115/73 | HR 83 | Temp 98.1°F | Ht 64.0 in | Wt 184.0 lb

## 2022-01-30 DIAGNOSIS — Z6831 Body mass index (BMI) 31.0-31.9, adult: Secondary | ICD-10-CM | POA: Diagnosis not present

## 2022-01-30 DIAGNOSIS — E559 Vitamin D deficiency, unspecified: Secondary | ICD-10-CM | POA: Diagnosis not present

## 2022-01-30 DIAGNOSIS — R7303 Prediabetes: Secondary | ICD-10-CM | POA: Diagnosis not present

## 2022-01-30 DIAGNOSIS — E669 Obesity, unspecified: Secondary | ICD-10-CM | POA: Diagnosis not present

## 2022-01-30 DIAGNOSIS — Z9189 Other specified personal risk factors, not elsewhere classified: Secondary | ICD-10-CM

## 2022-01-30 MED ORDER — VITAMIN D (ERGOCALCIFEROL) 1.25 MG (50000 UNIT) PO CAPS: 50000.0000 [IU] | ORAL_CAPSULE | ORAL | 0 refills | Status: DC

## 2022-01-31 ENCOUNTER — Inpatient Hospital Stay: Payer: 59 | Attending: Hematology & Oncology

## 2022-01-31 ENCOUNTER — Inpatient Hospital Stay: Payer: 59 | Admitting: Hematology & Oncology

## 2022-01-31 ENCOUNTER — Encounter: Payer: Self-pay | Admitting: Hematology & Oncology

## 2022-01-31 VITALS — BP 127/79 | HR 91 | Temp 98.3°F | Resp 18 | Wt 187.0 lb

## 2022-01-31 DIAGNOSIS — Z86718 Personal history of other venous thrombosis and embolism: Secondary | ICD-10-CM | POA: Insufficient documentation

## 2022-01-31 DIAGNOSIS — D500 Iron deficiency anemia secondary to blood loss (chronic): Secondary | ICD-10-CM | POA: Diagnosis not present

## 2022-01-31 DIAGNOSIS — C186 Malignant neoplasm of descending colon: Secondary | ICD-10-CM

## 2022-01-31 DIAGNOSIS — Z7901 Long term (current) use of anticoagulants: Secondary | ICD-10-CM | POA: Diagnosis not present

## 2022-01-31 DIAGNOSIS — I82411 Acute embolism and thrombosis of right femoral vein: Secondary | ICD-10-CM

## 2022-01-31 DIAGNOSIS — Z85038 Personal history of other malignant neoplasm of large intestine: Secondary | ICD-10-CM | POA: Insufficient documentation

## 2022-01-31 LAB — CBC WITH DIFFERENTIAL (CANCER CENTER ONLY)
Abs Immature Granulocytes: 0 10*3/uL (ref 0.00–0.07)
Basophils Absolute: 0 10*3/uL (ref 0.0–0.1)
Basophils Relative: 1 %
Eosinophils Absolute: 0.1 10*3/uL (ref 0.0–0.5)
Eosinophils Relative: 3 %
HCT: 37.4 % (ref 36.0–46.0)
Hemoglobin: 13.1 g/dL (ref 12.0–15.0)
Immature Granulocytes: 0 %
Lymphocytes Relative: 38 %
Lymphs Abs: 1.5 10*3/uL (ref 0.7–4.0)
MCH: 31.1 pg (ref 26.0–34.0)
MCHC: 35 g/dL (ref 30.0–36.0)
MCV: 88.8 fL (ref 80.0–100.0)
Monocytes Absolute: 0.3 10*3/uL (ref 0.1–1.0)
Monocytes Relative: 7 %
Neutro Abs: 2.1 10*3/uL (ref 1.7–7.7)
Neutrophils Relative %: 51 %
Platelet Count: 144 10*3/uL — ABNORMAL LOW (ref 150–400)
RBC: 4.21 MIL/uL (ref 3.87–5.11)
RDW: 12 % (ref 11.5–15.5)
WBC Count: 4 10*3/uL (ref 4.0–10.5)
nRBC: 0 % (ref 0.0–0.2)

## 2022-01-31 LAB — CMP (CANCER CENTER ONLY)
ALT: 16 U/L (ref 0–44)
AST: 18 U/L (ref 15–41)
Albumin: 4.4 g/dL (ref 3.5–5.0)
Alkaline Phosphatase: 55 U/L (ref 38–126)
Anion gap: 8 (ref 5–15)
BUN: 12 mg/dL (ref 8–23)
CO2: 30 mmol/L (ref 22–32)
Calcium: 9.6 mg/dL (ref 8.9–10.3)
Chloride: 104 mmol/L (ref 98–111)
Creatinine: 0.84 mg/dL (ref 0.44–1.00)
GFR, Estimated: >60 mL/min (ref >60)
Glucose, Bld: 93 mg/dL (ref 70–99)
Potassium: 4.4 mmol/L (ref 3.5–5.1)
Sodium: 142 mmol/L (ref 135–145)
Total Bilirubin: 0.9 mg/dL (ref 0.3–1.2)
Total Protein: 7 g/dL (ref 6.5–8.1)

## 2022-01-31 LAB — FERRITIN: Ferritin: 457 ng/mL — ABNORMAL HIGH (ref 11–307)

## 2022-01-31 LAB — IRON AND IRON BINDING CAPACITY (CC-WL,HP ONLY)
Iron: 121 ug/dL (ref 28–170)
Saturation Ratios: 32 % — ABNORMAL HIGH (ref 10.4–31.8)
TIBC: 375 ug/dL (ref 250–450)
UIBC: 254 ug/dL (ref 148–442)

## 2022-01-31 LAB — CEA (IN HOUSE-CHCC): CEA (CHCC-In House): 4.76 ng/mL (ref 0.00–5.00)

## 2022-01-31 NOTE — Progress Notes (Signed)
Hematology and Oncology Follow Up Visit  KAWTHAR ENNEN [id number] 1956/11/12 66 y.o. 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 Bueso is here today for follow-up.  She is getting ready to go on a [place] cruise in April.  She really is looking forward to this.  Her next cruise will be I think in July and that she will be going to [place] and other parts of [place].  This will be a longer cruise.  She feels well.  She is having no problems with the Xarelto.  She is on low-dose Xarelto.  Her last CEA level back in August was 8.9.  This is holding steady.  She did have Dopplers done back in September.  Everything looked fine without any evidence of thromboembolic disease.  She has had no problems with COVID.  She has had no cough or shortness of breath.  She has had no rashes.  There is been no change in bowel or bladder habits.  Her last mammogram was done back in I think August of last year and everything looked fine.  Currently, I would say performance status is probably ECOG 0.    Medications:  Allergies as of 01/31/2022       Reactions   Simvastatin Other (See Comments)   Severe rhabdomyolysis.   Strawberry Extract Swelling   Face only. Breathing not affected.        Medication List        Accurate as of January 31, 2022 10:20 AM. If you have any questions, ask your nurse or doctor.          fluvastatin 40 MG capsule Commonly known as: LESCOL Take 40 mg by mouth at bedtime. Take 2 capsules by mouth daily   hydrochlorothiazide 25 MG tablet Commonly known as: HYDRODIURIL 25 mg daily. Takes 1 tab in the morning   lisinopril 10 MG tablet Commonly known as:  ZESTRIL Take 10 mg by mouth daily.   metFORMIN 500 MG tablet Commonly known as: GLUCOPHAGE Take 1 tablet (500 mg total) by mouth daily with breakfast.   Vitamin D (Ergocalciferol) 1.25 MG (50000 UNIT) Caps capsule Commonly known as: DRISDOL Take 1 capsule (50,000 Units total) by mouth every 7 (seven) days.   Xarelto 2.5 MG Tabs tablet Generic drug: rivaroxaban TAKE 2 TABLETS BY MOUTH EVERY DAY        Allergies:  Allergies  Allergen Reactions   Simvastatin Other (See Comments)    Severe rhabdomyolysis.   Strawberry Extract Swelling    Face only. Breathing not affected.    Past Medical History, Surgical history, Social history, and Family History were reviewed and updated.  Review of Systems: Review of Systems  Constitutional: Negative.   HENT: Negative.    Eyes: Negative.   Respiratory: Negative.    Cardiovascular: Negative.   Gastrointestinal: Negative.   Genitourinary: Negative.   Musculoskeletal: Negative.   Skin: Negative.   Neurological: Negative.   Endo/Heme/Allergies: Negative.   Psychiatric/Behavioral: Negative.    Physical Exam:  weight is 187 lb (84.8 kg). Her oral temperature is 98.3 F (36.8 C). Her blood pressure is 127/79 and her pulse is 91. Her respiration is 18 and oxygen saturation is  97%.   Wt Readings from Last 3 Encounters:  01/31/22 187 lb (84.8 kg)  01/30/22 184 lb (83.5 kg)  01/16/22 187 lb (84.8 kg)    Physical Exam Vitals reviewed.  HENT:     Head: Normocephalic and atraumatic.  Eyes:     Pupils: Pupils are equal, round, and reactive to light.  Cardiovascular:     Rate and Rhythm: Normal rate and regular rhythm.     Heart sounds: Normal heart sounds.  Pulmonary:     Effort: Pulmonary effort is normal.     Breath sounds: Normal breath sounds.  Abdominal:     General: Bowel sounds are normal.     Palpations: Abdomen is soft.  Musculoskeletal:        General: No tenderness or deformity. Normal range of motion.     Cervical  back: Normal range of motion.  Lymphadenopathy:     Cervical: No cervical adenopathy.  Skin:    General: Skin is warm and dry.     Findings: No erythema or rash.  Neurological:     Mental Status: She is alert and oriented to person, place, and time.  Psychiatric:        Behavior: Behavior normal.        Thought Content: Thought content normal.        Judgment: Judgment normal.    Lab Results  Component Value Date   WBC 4.0 01/31/2022   HGB 13.1 01/31/2022   HCT 37.4 01/31/2022   MCV 88.8 01/31/2022   PLT 144 (L) 01/31/2022   Lab Results  Component Value Date   FERRITIN 392 (H) 08/03/2020   IRON 107 08/03/2020   TIBC 333 08/03/2020   UIBC 226 08/03/2020   IRONPCTSAT 32 08/03/2020   Lab Results  Component Value Date   RETICCTPCT 1.2 03/01/2015   RBC 4.21 01/31/2022   RETICCTABS 52.1 03/01/2015   No results found for: KPAFRELGTCHN, LAMBDASER, KAPLAMBRATIO No results found for: IGGSERUM, IGA, IGMSERUM No results found for: Odetta Pink, SPEI   Chemistry      Component Value Date/Time   NA 142 01/31/2022 0904   NA 141 12/18/2021 1038   NA 146 (H) 10/16/2017 0804   NA 141 03/13/2017 0807   K 4.4 01/31/2022 0904   K 3.8 10/16/2017 0804   K 3.9 03/13/2017 0807   CL 104 01/31/2022 0904   CL 106 10/16/2017 0804   CO2 30 01/31/2022 0904   CO2 29 10/16/2017 0804   CO2 27 03/13/2017 0807   BUN 12 01/31/2022 0904   BUN 12 12/18/2021 1038   BUN 10 10/16/2017 0804   BUN 12.4 03/13/2017 0807   CREATININE 0.84 01/31/2022 0904   CREATININE 1.0 10/16/2017 0804   CREATININE 0.8 03/13/2017 0807      Component Value Date/Time   CALCIUM 9.6 01/31/2022 0904   CALCIUM 9.3 10/16/2017 0804   CALCIUM 9.6 03/13/2017 0807   ALKPHOS 55 01/31/2022 0904   ALKPHOS 72 10/16/2017 0804   ALKPHOS 70 03/13/2017 0807   AST 18 01/31/2022 0904   AST 16 03/13/2017 0807   ALT 16 01/31/2022 0904   ALT 28 10/16/2017 0804   ALT 18  03/13/2017 0807   BILITOT 0.9 01/31/2022 0904   BILITOT 0.93 03/13/2017 0807      Impression and Plan: Ms. Woodrow is a lovely 66 yo African American female with history of stage II adenocarcinoma of the sigmoid colon.  Her Oncotype score was 20.  She had 36 lymph nodes that were negative. She had her surgery in January of 2013 and did not require adjuvant treatment.   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  All of her hypercoagulable studies were normal.   I will keep her on the 5 mg of Xarelto indefinitely.  Particularly, since she travels, I think this Xarelto will be helpful.  We will plan to have her come back in 6 more months.  I would like to see her back after she has her big cruise.  I think the next big cruise will be next year actually.    [name], MD 2/10/202310:20 AM

## 2022-02-02 NOTE — Progress Notes (Signed)
Chief Complaint:   OBESITY Mallory Morales is here to discuss her progress with her obesity treatment plan along with follow-up of her obesity related diagnoses. Mallory Morales is on the Category 2 Plan + 100 calories and states she is following her eating plan approximately 80% of the time. Mallory Morales states she is doing 0 minutes 0 times per week.  Today's visit was #: 4 Starting weight: 191 lbs Starting date: 12/18/2021 Today's weight: 184 lbs Today's date: 01/30/2022 Total lbs lost to date: 7 Total lbs lost since last in-office visit: 3  Interim History: Mallory Morales continues to do well with weight loss. She struggles with eating all of her protein especially at dinner. She would like more dinner options.  Subjective:   1. Vitamin D deficiency Mallory Morales is stable on Vitamin D, but her level is not yet at goal.  2. Pre-diabetes Mallory Morales is on metformin, and she denies nausea or vomiting. Constipation was noted and improved with miralax.  3. At risk for diabetes mellitus Mallory Morales is at higher than average risk for developing diabetes due to her obesity.  Assessment/Plan:   1. Vitamin D deficiency We will refill prescription Vitamin D 50,000 IU every week for 1 month. Shalon will follow-up for routine testing of Vitamin D, at least 2-3 times per year to avoid over-replacement.  - Vitamin D, Ergocalciferol, (DRISDOL) 1.25 MG (50000 UNIT) CAPS capsule; Take 1 capsule (50,000 Units total) by mouth every 7 (seven) days.  Dispense: 4 capsule; Refill: 0  2. Pre-diabetes Mallory Morales will continue with metformin as is, and will continue with diet and exercise to help decrease the risk of diabetes.   3. At risk for diabetes mellitus Mallory Morales was given approximately 15 minutes of diabetic education and counseling today. We discussed intensive lifestyle modifications today with an emphasis on weight loss as well as increasing exercise and decreasing simple carbohydrates in her diet. We also reviewed medication  options with an emphasis on risk versus benefits of those discussed.  Repetitive spaced learning was employed today to elicit superior memory formation and behavioral change.  4. Obesity wit Current BMI of 31.7 Mallory Morales is currently in the action stage of change. As such, her goal is to continue with weight loss efforts. She has agreed to the Category 2 Plan and keeping a food journal and adhering to recommended goals of 400-550 calories and 40+ grams of protein at supper daily.   Soup recipes were given.  Behavioral modification strategies: increasing lean protein intake and meal planning and cooking strategies.  Mallory Morales has agreed to follow-up with our clinic in 2 weeks. She was informed of the importance of frequent follow-up visits to maximize her success with intensive lifestyle modifications for her multiple health conditions.   Objective:   Blood pressure 115/73, pulse 83, temperature 98.1 F (36.7 C), height 5\' 4"  (1.626 m), weight 184 lb (83.5 kg), SpO2 99 %. Body mass index is 31.58 kg/m.  General: Cooperative, alert, well developed, in no acute distress. HEENT: Conjunctivae and lids unremarkable. Cardiovascular: Regular rhythm.  Lungs: Normal work of breathing. Neurologic: No focal deficits.   Lab Results  Component Value Date   CREATININE 0.84 01/31/2022   BUN 12 01/31/2022   NA 142 01/31/2022   K 4.4 01/31/2022   CL 104 01/31/2022   CO2 30 01/31/2022   Lab Results  Component Value Date   ALT 16 01/31/2022   AST 18 01/31/2022   ALKPHOS 55 01/31/2022   BILITOT 0.9 01/31/2022   Lab Results  Component Value Date   HGBA1C 6.4 (H) 12/18/2021   Lab Results  Component Value Date   INSULIN 18.3 12/18/2021   Lab Results  Component Value Date   TSH 1.270 12/18/2021   Lab Results  Component Value Date   CHOL 262 (H) 12/18/2021   HDL 84 12/18/2021   LDLCALC 165 (H) 12/18/2021   TRIG 79 12/18/2021   CHOLHDL 3.1 12/18/2021   Lab Results  Component Value  Date   VD25OH 6.7 (L) 12/18/2021   Lab Results  Component Value Date   WBC 4.0 01/31/2022   HGB 13.1 01/31/2022   HCT 37.4 01/31/2022   MCV 88.8 01/31/2022   PLT 144 (L) 01/31/2022   Lab Results  Component Value Date   IRON 121 01/31/2022   TIBC 375 01/31/2022   FERRITIN 457 (H) 01/31/2022   Attestation Statements:   Reviewed by clinician on day of visit: allergies, medications, problem list, medical history, surgical history, family history, social history, and previous encounter notes.   I, Trixie Dredge, am acting as transcriptionist for Dennard Nip, MD.  I have reviewed the above documentation for accuracy and completeness, and I agree with the above. -  Dennard Nip, MD

## 2022-02-03 ENCOUNTER — Encounter: Payer: Self-pay | Admitting: *Deleted

## 2022-02-12 ENCOUNTER — Encounter (INDEPENDENT_AMBULATORY_CARE_PROVIDER_SITE_OTHER): Payer: Self-pay | Admitting: Physician Assistant

## 2022-02-12 ENCOUNTER — Ambulatory Visit (INDEPENDENT_AMBULATORY_CARE_PROVIDER_SITE_OTHER): Payer: 59 | Admitting: Physician Assistant

## 2022-02-12 ENCOUNTER — Other Ambulatory Visit: Payer: Self-pay

## 2022-02-12 VITALS — BP 128/77 | HR 86 | Temp 97.8°F | Ht 64.0 in | Wt 182.0 lb

## 2022-02-12 DIAGNOSIS — E669 Obesity, unspecified: Secondary | ICD-10-CM

## 2022-02-12 DIAGNOSIS — Z9189 Other specified personal risk factors, not elsewhere classified: Secondary | ICD-10-CM

## 2022-02-12 DIAGNOSIS — E559 Vitamin D deficiency, unspecified: Secondary | ICD-10-CM | POA: Diagnosis not present

## 2022-02-12 DIAGNOSIS — E66811 Obesity, class 1: Secondary | ICD-10-CM

## 2022-02-12 DIAGNOSIS — R7303 Prediabetes: Secondary | ICD-10-CM | POA: Diagnosis not present

## 2022-02-12 DIAGNOSIS — Z6831 Body mass index (BMI) 31.0-31.9, adult: Secondary | ICD-10-CM

## 2022-02-12 MED ORDER — METFORMIN HCL 500 MG PO TABS: 500.0000 mg | ORAL_TABLET | Freq: Every day | ORAL | 0 refills | Status: DC

## 2022-02-12 MED ORDER — VITAMIN D (ERGOCALCIFEROL) 1.25 MG (50000 UNIT) PO CAPS: 50000.0000 [IU] | ORAL_CAPSULE | ORAL | 0 refills | Status: DC

## 2022-02-12 NOTE — Progress Notes (Signed)
Chief Complaint:   OBESITY Mallory Morales is here to discuss her progress with her obesity treatment plan along with follow-up of her obesity related diagnoses. Mallory Morales is on the Category 2 Plan and keeping a food journal and adhering to recommended goals of 400-550 calories and 40+ grams of protein at supper daily and states she is following her eating plan approximately 70% of the time. Mallory Morales states she is walking for 20-25 minutes 2 times per week.  Today's visit was #: 5 Starting weight: 191 lbs Starting date: 12/18/2021 Today's weight: 182 lbs Today's date: 02/12/2022 Total lbs lost to date: 9 Total lbs lost since last in-office visit: 2  Interim History: Mallory Morales did well with weight loss. She continues to struggle with getting protein at dinner because she doesn't eat a lot of meat. Reviewed protein substitutions today. She is not weighing her protein.  Subjective:   1. Pre-diabetes Mallory Morales takes metformin, and she is tolerating it well. Last A1c was 6.4.  2. Vitamin D deficiency Mallory Morales is on Vit D weekly., and she is tolerating it well. She notes that her back pain is resolved since starting Vit D.  3. At risk for impaired metabolic function Mallory Morales is at increased risk for impaired metabolic function due to not getting enough protein daily.  Assessment/Plan:   1. Pre-diabetes We will refill metformin for 1 month. Mallory Morales will continue to work on weight loss, exercise, and decreasing simple carbohydrates to help decrease the risk of diabetes.   - metFORMIN (GLUCOPHAGE) 500 MG tablet; Take 1 tablet (500 mg total) by mouth daily with breakfast.  Dispense: 30 tablet; Refill: 0  2. Vitamin D deficiency We will refill prescription Vitamin D for 1 month. Mallory Morales will follow-up for routine testing of Vitamin D, at least 2-3 times per year to avoid over-replacement.  - Vitamin D, Ergocalciferol, (DRISDOL) 1.25 MG (50000 UNIT) CAPS capsule; Take 1 capsule (50,000 Units total) by  mouth every 7 (seven) days.  Dispense: 4 capsule; Refill: 0  3. At risk for impaired metabolic function Mallory Morales was given approximately 15 minutes of impaired  metabolic function prevention counseling today. We discussed intensive lifestyle modifications today with an emphasis on specific nutrition and exercise instructions and strategies.   Repetitive spaced learning was employed today to elicit superior memory formation and behavioral change.  4. Obesity wit Current BMI of 31.22 Mallory Morales is currently in the action stage of change. As such, her goal is to continue with weight loss efforts. She has agreed to the Category 2 Plan and keeping a food journal and adhering to recommended goals of 400-550 calories and 40+ grams of protein at supper daily.   Exercise goals: As is.  Behavioral modification strategies: meal planning and cooking strategies and keeping healthy foods in the home.  Mallory Morales has agreed to follow-up with our clinic in 3 weeks. She was informed of the importance of frequent follow-up visits to maximize her success with intensive lifestyle modifications for her multiple health conditions.   Objective:   Blood pressure 128/77, pulse 86, temperature 97.8 F (36.6 C), height 5\' 4"  (1.626 m), weight 182 lb (82.6 kg), SpO2 98 %. Body mass index is 31.24 kg/m.  General: Cooperative, alert, well developed, in no acute distress. HEENT: Conjunctivae and lids unremarkable. Cardiovascular: Regular rhythm.  Lungs: Normal work of breathing. Neurologic: No focal deficits.   Lab Results  Component Value Date   CREATININE 0.84 01/31/2022   BUN 12 01/31/2022   NA 142 01/31/2022  K 4.4 01/31/2022   CL 104 01/31/2022   CO2 30 01/31/2022   Lab Results  Component Value Date   ALT 16 01/31/2022   AST 18 01/31/2022   ALKPHOS 55 01/31/2022   BILITOT 0.9 01/31/2022   Lab Results  Component Value Date   HGBA1C 6.4 (H) 12/18/2021   Lab Results  Component Value Date   INSULIN  18.3 12/18/2021   Lab Results  Component Value Date   TSH 1.270 12/18/2021   Lab Results  Component Value Date   CHOL 262 (H) 12/18/2021   HDL 84 12/18/2021   LDLCALC 165 (H) 12/18/2021   TRIG 79 12/18/2021   CHOLHDL 3.1 12/18/2021   Lab Results  Component Value Date   VD25OH 6.7 (L) 12/18/2021   Lab Results  Component Value Date   WBC 4.0 01/31/2022   HGB 13.1 01/31/2022   HCT 37.4 01/31/2022   MCV 88.8 01/31/2022   PLT 144 (L) 01/31/2022   Lab Results  Component Value Date   IRON 121 01/31/2022   TIBC 375 01/31/2022   FERRITIN 457 (H) 01/31/2022   Attestation Statements:   Reviewed by clinician on day of visit: allergies, medications, problem list, medical history, surgical history, family history, social history, and previous encounter notes.   Mallory Morales, am acting as transcriptionist for [employer], PA-C.  I have reviewed the above documentation for accuracy and completeness, and I agree with the above. Mallory Potash, PA-C

## 2022-03-03 ENCOUNTER — Encounter (INDEPENDENT_AMBULATORY_CARE_PROVIDER_SITE_OTHER): Payer: Self-pay | Admitting: Family Medicine

## 2022-03-03 ENCOUNTER — Other Ambulatory Visit: Payer: Self-pay

## 2022-03-03 ENCOUNTER — Ambulatory Visit (INDEPENDENT_AMBULATORY_CARE_PROVIDER_SITE_OTHER): Payer: 59 | Admitting: Family Medicine

## 2022-03-03 VITALS — BP 123/77 | HR 85 | Temp 98.3°F | Ht 64.0 in | Wt 184.0 lb

## 2022-03-03 DIAGNOSIS — Z6831 Body mass index (BMI) 31.0-31.9, adult: Secondary | ICD-10-CM

## 2022-03-03 DIAGNOSIS — E559 Vitamin D deficiency, unspecified: Secondary | ICD-10-CM

## 2022-03-03 DIAGNOSIS — Z9189 Other specified personal risk factors, not elsewhere classified: Secondary | ICD-10-CM

## 2022-03-03 DIAGNOSIS — E669 Obesity, unspecified: Secondary | ICD-10-CM

## 2022-03-03 DIAGNOSIS — R7303 Prediabetes: Secondary | ICD-10-CM

## 2022-03-04 MED ORDER — METFORMIN HCL 500 MG PO TABS: 500.0000 mg | ORAL_TABLET | Freq: Every day | ORAL | 0 refills | Status: DC

## 2022-03-04 MED ORDER — VITAMIN D (ERGOCALCIFEROL) 1.25 MG (50000 UNIT) PO CAPS: 50000.0000 [IU] | ORAL_CAPSULE | ORAL | 0 refills | Status: DC

## 2022-03-05 NOTE — Progress Notes (Signed)
? ? ? ?Chief Complaint:  ? ?OBESITY ?Mallory Morales is here to discuss her progress with her obesity treatment plan along with follow-up of her obesity related diagnoses. Mallory Morales is on the Category 2 Plan and keeping a food journal and adhering to recommended goals of 400-550 calories and 40+ grams of protein at supper daily and states she is following her eating plan approximately 40% of the time. Mallory Morales states she is doing 0 minutes 0 times per week. ? ?Today's visit was #: 6 ?Starting weight: 191 lbs ?Starting date: 12/18/2021 ?Today's weight: 184 lbs ?Today's date: 03/03/2022 ?Total lbs lost to date: 7 ?Total lbs lost since last in-office visit: 0 ? ?Interim History: Mallory Morales has overall done well. She notes she has gotten off track since her last visit. She plans to get back on track and follow her Category 2 plan. ? ?Subjective:  ? ?1. Pre-diabetes ?Mallory Morales is taking metformin 500 mg, and she denies side effects. Last A1c was 6.4. ? ?2. Vitamin D deficiency ?Mallory Morales is taking Vit D 50,000 IU weekly. She denies nausea, vomiting, or muscle weakness. ? ?3. At high risk for fluid overload ?Mallory Morales is at a higher than average risk for fluid retention due to increase in simple carbohydrates recently. ? ?Assessment/Plan:  ? ?1. Pre-diabetes ?We will refill metformin 500 mg for 1 month. [name] will continue working dietary changes, weight loss, and decreasing simple carbohydrates to help decrease the risk of diabetes.  ? ?- metFORMIN (GLUCOPHAGE) 500 MG tablet; Take 1 tablet (500 mg total) by mouth daily with breakfast.  Dispense: 30 tablet; Refill: 0 ? ?2. Vitamin D deficiency ?Mallory Morales will continue prescription Vitamin D 50,000 IU every week, and we will refill for 1 month. She will follow-up for routine testing of Vitamin D, at least 2-3 times per year to avoid over-replacement. ? ?- Vitamin D, Ergocalciferol, (DRISDOL) 1.25 MG (50000 UNIT) CAPS capsule; Take 1 capsule (50,000 Units total) by mouth every 7 (seven) days.   Dispense: 4 capsule; Refill: 0 ? ?3. At high risk for fluid overload ?Mallory Morales was given approximately 15 minutes of fluid retention prevention counseling today. She is 66 y.o. female and has risk factors for fluid retention including obesity. We discussed intensive lifestyle modifications today with an emphasis on specific weight loss instructions, proper nutrition and exercise strategies. Mallory Morales is to increase her water intake and will continue to monitor. ? ?Repetitive spaced learning was employed today to elicit superior memory formation and behavioral change. ? ?4. Obesity wit Current BMI of 31.7 ?Mallory Morales is currently in the action stage of change. As such, her goal is to continue with weight loss efforts. She has agreed to the Category 2 Plan.  ? ?Behavioral modification strategies: increasing lean protein intake, increasing water intake, no skipping meals, and meal planning and cooking strategies. ? ?Mallory Morales has agreed to follow-up with our clinic in 3 weeks. She was informed of the importance of frequent follow-up visits to maximize her success with intensive lifestyle modifications for her multiple health conditions.  ? ?Objective:  ? ?Blood pressure 123/77, pulse 85, temperature 98.3 ?F (36.8 ?C), height '5\' 4"'$  (1.626 m), weight 184 lb (83.5 kg), SpO2 98 %. ?Body mass index is 31.58 kg/m?. ? ?General: Cooperative, alert, well developed, in no acute distress. ?HEENT: Conjunctivae and lids unremarkable. ?Cardiovascular: Regular rhythm.  ?Lungs: Normal work of breathing. ?Neurologic: No focal deficits.  ? ?Lab Results  ?Component Value Date  ? CREATININE 0.84 01/31/2022  ? BUN 12 01/31/2022  ? NA 142 01/31/2022  ?  K 4.4 01/31/2022  ? CL 104 01/31/2022  ? CO2 30 01/31/2022  ? ?Lab Results  ?Component Value Date  ? ALT 16 01/31/2022  ? AST 18 01/31/2022  ? ALKPHOS 55 01/31/2022  ? BILITOT 0.9 01/31/2022  ? ?Lab Results  ?Component Value Date  ? HGBA1C 6.4 (H) 12/18/2021  ? ?Lab Results  ?Component Value Date  ?  INSULIN 18.3 12/18/2021  ? ?Lab Results  ?Component Value Date  ? TSH 1.270 12/18/2021  ? ?Lab Results  ?Component Value Date  ? CHOL 262 (H) 12/18/2021  ? HDL 84 12/18/2021  ? LDLCALC 165 (H) 12/18/2021  ? TRIG 79 12/18/2021  ? CHOLHDL 3.1 12/18/2021  ? ?Lab Results  ?Component Value Date  ? VD25OH 6.7 (L) 12/18/2021  ? ?Lab Results  ?Component Value Date  ? WBC 4.0 01/31/2022  ? HGB 13.1 01/31/2022  ? HCT 37.4 01/31/2022  ? MCV 88.8 01/31/2022  ? PLT 144 (L) 01/31/2022  ? ?Lab Results  ?Component Value Date  ? IRON 121 01/31/2022  ? TIBC 375 01/31/2022  ? FERRITIN 457 (H) 01/31/2022  ? ?Attestation Statements:  ? ?Reviewed by clinician on day of visit: allergies, medications, problem list, medical history, surgical history, family history, social history, and previous encounter notes. ? ? ?I, Trixie Dredge, am acting as transcriptionist for Dennard Nip, MD. ? ?I have reviewed the above documentation for accuracy and completeness, and I agree with the above. -  Dennard Nip, MD ? ? ?

## 2022-03-19 ENCOUNTER — Other Ambulatory Visit: Payer: Self-pay

## 2022-03-19 ENCOUNTER — Encounter (INDEPENDENT_AMBULATORY_CARE_PROVIDER_SITE_OTHER): Payer: Self-pay | Admitting: Physician Assistant

## 2022-03-19 ENCOUNTER — Ambulatory Visit (INDEPENDENT_AMBULATORY_CARE_PROVIDER_SITE_OTHER): Payer: 59 | Admitting: Physician Assistant

## 2022-03-19 VITALS — BP 133/79 | HR 86 | Temp 97.9°F | Ht 64.0 in | Wt 184.0 lb

## 2022-03-19 DIAGNOSIS — E559 Vitamin D deficiency, unspecified: Secondary | ICD-10-CM

## 2022-03-19 DIAGNOSIS — Z9189 Other specified personal risk factors, not elsewhere classified: Secondary | ICD-10-CM

## 2022-03-19 DIAGNOSIS — E669 Obesity, unspecified: Secondary | ICD-10-CM

## 2022-03-19 DIAGNOSIS — Z6831 Body mass index (BMI) 31.0-31.9, adult: Secondary | ICD-10-CM

## 2022-03-19 DIAGNOSIS — R7303 Prediabetes: Secondary | ICD-10-CM

## 2022-03-19 MED ORDER — VITAMIN D (ERGOCALCIFEROL) 1.25 MG (50000 UNIT) PO CAPS: 50000.0000 [IU] | ORAL_CAPSULE | ORAL | 0 refills | Status: DC

## 2022-03-19 MED ORDER — METFORMIN HCL 500 MG PO TABS: 500.0000 mg | ORAL_TABLET | Freq: Every day | ORAL | 0 refills | Status: DC

## 2022-03-19 NOTE — Progress Notes (Signed)
? ? ? ?Chief Complaint:  ? ?OBESITY ?Mallory Morales is here to discuss her progress with her obesity treatment plan along with follow-up of her obesity related diagnoses. Mallory Morales is on the Category 2 Plan and states she is following her eating plan approximately 40% of the time. Mallory Morales states she is walking for 15 minutes 2 times per week. ? ?Today's visit was #: 7 ?Starting weight: 191 lbs ?Starting date: 12/18/2021 ?Today's weight: 184 lbs ?Today's date: 03/19/2022 ?Total lbs lost to date: 7 lbs ?Total lbs lost since last in-office visit: 0 ? ?Interim History: Mallory Morales feels she has been over drinking her calories with champagne. She is eating a bowl of Crispex cereal with Lactacid milk at night. She is going on a cruise in 4 weeks.  ? ?Subjective:  ? ?1. Pre-diabetes ?Mallory Morales is on currently on Metformin 500 mg once daily and she is taking Metformin as prescribed. She denies nausea, vomiting and diarrhea.  ? ?2. Vitamin D deficiency ?Mallory Morales is on Vitamin D 50,000 units weekly.  ? ?3. At risk for diabetes mellitus ?Mallory Morales is at higher than average risk for developing diabetes due to her obesity.  ? ?Assessment/Plan:  ? ?1. Pre-diabetes ?We will refill Metformin 500 mg for 1 month with no refills. Mallory Morales will continue to work on weight loss, exercise, and decreasing simple carbohydrates to help decrease the risk of diabetes.  ? ?- metFORMIN (GLUCOPHAGE) 500 MG tablet; Take 1 tablet (500 mg total) by mouth daily with breakfast.  Dispense: 30 tablet; Refill: 0 ? ?2. Vitamin D deficiency ?Low Vitamin D level contributes to fatigue and are associated with obesity, breast, and colon cancer. We will refill prescription Vitamin D 50,000 IU every week for 1 month with no refills and Mallory Morales will follow-up for routine testing of Vitamin D, at least 2-3 times per year to avoid over-replacement. ? ?- Vitamin D, Ergocalciferol, (DRISDOL) 1.25 MG (50000 UNIT) CAPS capsule; Take 1 capsule (50,000 Units total) by mouth every 7  (seven) days.  Dispense: 4 capsule; Refill: 0 ? ?3. At risk for diabetes mellitus ?Mallory Morales was given approximately 15 minutes of diabetic education and counseling today. We discussed intensive lifestyle modifications today with an emphasis on weight loss as well as increasing exercise and decreasing simple carbohydrates in her diet. We also reviewed medication options with an emphasis on risk versus benefits of those discussed. ? ?Repetitive spaced learning was employed today to elicit superior memory formation and behavioral change.  ? ?4. Obesity wit Current BMI of 31.7 ?Mallory Morales is currently in the action stage of change. As such, her goal is to continue with weight loss efforts. She has agreed to the Category 2 Plan.  ? ?Exercise goals:  As is. ? ?Behavioral modification strategies: meal planning and cooking strategies and travel eating strategies. ? ?Mallory Morales has agreed to follow-up with our clinic in 3 weeks. She was informed of the importance of frequent follow-up visits to maximize her success with intensive lifestyle modifications for her multiple health conditions.  ? ?Objective:  ? ?Blood pressure 133/79, pulse 86, temperature 97.9 ?F (36.6 ?C), height '5\' 4"'$  (1.626 m), weight 184 lb (83.5 kg), SpO2 99 %. ?Body mass index is 31.58 kg/m?. ? ?General: Cooperative, alert, well developed, in no acute distress. ?HEENT: Conjunctivae and lids unremarkable. ?Cardiovascular: Regular rhythm.  ?Lungs: Normal work of breathing. ?Neurologic: No focal deficits.  ? ?Lab Results  ?Component Value Date  ? CREATININE 0.84 01/31/2022  ? BUN 12 01/31/2022  ? NA 142 01/31/2022  ?  K 4.4 01/31/2022  ? CL 104 01/31/2022  ? CO2 30 01/31/2022  ? ?Lab Results  ?Component Value Date  ? ALT 16 01/31/2022  ? AST 18 01/31/2022  ? ALKPHOS 55 01/31/2022  ? BILITOT 0.9 01/31/2022  ? ?Lab Results  ?Component Value Date  ? HGBA1C 6.4 (H) 12/18/2021  ? ?Lab Results  ?Component Value Date  ? INSULIN 18.3 12/18/2021  ? ?Lab Results  ?Component  Value Date  ? TSH 1.270 12/18/2021  ? ?Lab Results  ?Component Value Date  ? CHOL 262 (H) 12/18/2021  ? HDL 84 12/18/2021  ? LDLCALC 165 (H) 12/18/2021  ? TRIG 79 12/18/2021  ? CHOLHDL 3.1 12/18/2021  ? ?Lab Results  ?Component Value Date  ? VD25OH 6.7 (L) 12/18/2021  ? ?Lab Results  ?Component Value Date  ? WBC 4.0 01/31/2022  ? HGB 13.1 01/31/2022  ? HCT 37.4 01/31/2022  ? MCV 88.8 01/31/2022  ? PLT 144 (L) 01/31/2022  ? ?Lab Results  ?Component Value Date  ? IRON 121 01/31/2022  ? TIBC 375 01/31/2022  ? FERRITIN 457 (H) 01/31/2022  ? ?Attestation Statements:  ? ?Reviewed by clinician on day of visit: allergies, medications, problem list, medical history, surgical history, family history, social history, and previous encounter notes. ? ?I, Tonye Pearson, am acting as Location manager for [employer], PA-C. ? ?I have reviewed the above documentation for accuracy and completeness, and I agree with the above. Abby Potash, PA-C ? ?

## 2022-03-26 ENCOUNTER — Ambulatory Visit
Admission: RE | Admit: 2022-03-26 | Discharge: 2022-03-26 | Disposition: A | Discharge status: Home / Self Care | Payer: 59 | Source: Ambulatory Visit | Attending: Family Medicine | Admitting: Family Medicine

## 2022-03-26 VITALS — BP 144/79 | HR 92 | Temp 98.0°F | Resp 18

## 2022-03-26 DIAGNOSIS — B029 Zoster without complications: Secondary | ICD-10-CM

## 2022-03-26 MED ORDER — VALACYCLOVIR HCL 1 G PO TABS: 1000.0000 mg | ORAL_TABLET | Freq: Three times a day (TID) | ORAL | 0 refills | Status: AC

## 2022-03-26 MED ORDER — TRAMADOL HCL 50 MG PO TABS: 50.0000 mg | ORAL_TABLET | Freq: Four times a day (QID) | ORAL | 0 refills | Status: DC | PRN

## 2022-03-26 NOTE — Discharge Instructions (Addendum)
Take valacyclovir 1000 mg--1 tablet 3 times daily for 7 days ? ?Take tramadol 50 mg--1 tablet every 6 hours as needed for pain ?

## 2022-03-26 NOTE — ED Triage Notes (Signed)
Pt is present today with a rash on the right side of her groin, top of her buttocks, and on the back of her right leg. Pt states that she noticed it Monday.  ?

## 2022-03-26 NOTE — ED Provider Notes (Addendum)
[facility]    CSN: [id number] Arrival date & time: 03/26/22  1749      History   Chief Complaint Chief Complaint  Patient presents with   Rash    Itching and burning rash in groin area on right side of body only.Marland Kitchenlower buttocks area also. - Entered by patient    HPI Mallory Morales is a 66 y.o. female.    Rash Here for a burning and itching rash in her perineal area on the right and in her right upper buttock region.  This began the evening of April 3.  No fever or chills.  No dysuria or frequency.  No trouble having a bowel movement.  Past Medical History:  Diagnosis Date   Anemia, iron deficiency 01/27/2012   Back pain    Cancer (HCC)    Clotting disorder (HCC)    Colon cancer (HCC)    T3N0M0, emergency surgery/with colostomy done-no further tx.   Complication of anesthesia    "hard time getting breath" post op   Constipation    DVT (deep venous thrombosis) (HCC)    Edema of both lower extremities    Fatigue    History of stomach ulcers    Hyperlipemia    Hypertension    Joint pain    Lactose intolerance    Prediabetes    Pulmonary embolism (HCC)    taking injections -last done 1'14(Arixtra)   SOB (shortness of breath)     Patient Active Problem List   Diagnosis Date Noted   Arm DVT (deep venous thromboembolism), acute, right (HCC) 03/13/2017   Personal history of DVT (deep vein thrombosis) 12/14/2016   Rhabdomyolysis 12/13/2016   Lower GI bleeding 03/28/2013   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   COLOSTOMY  01/14/2012   COLOSTOMY TAKEDOWN N/A 02/24/2013   Procedure: Laparoscopic Assisted Colostomy Closure;  Surgeon: Adolph Pollack, MD;  Location: [facility];  Service: General;  Laterality: N/A;  Laparoscopic Assisted Colostomy Closure   FLEXIBLE SIGMOIDOSCOPY N/A 03/17/2014   Procedure: FLEXIBLE SIGMOIDOSCOPY;  Surgeon: Theda Belfast, MD;  Location: [facility];  Service: Endoscopy;  Laterality: N/A;   FLEXIBLE SIGMOIDOSCOPY N/A 04/07/2014   Procedure: FLEXIBLE SIGMOIDOSCOPY;  Surgeon: Theda Belfast, MD;  Location: [facility];  Service: Endoscopy;  Laterality: N/A;  no sedation   HERNIA REPAIR     HOT HEMOSTASIS N/A 03/17/2014   Procedure: HOT HEMOSTASIS (ARGON PLASMA COAGULATION/BICAP);  Surgeon: Theda Belfast, MD;  Location: [facility];  Service: Endoscopy;  Laterality: N/A;   HOT HEMOSTASIS N/A 04/07/2014   Procedure: HOT HEMOSTASIS (ARGON PLASMA COAGULATION/BICAP);  Surgeon: Theda Belfast, MD;  Location: [facility];  Service: Endoscopy;  Laterality: N/A;    OB History     Gravida  0   Para  0   Term  0   Preterm  0   AB  0   Living  0      SAB  0  IAB  0   Ectopic  0   Multiple  0   Live Births  0            Home Medications    Prior to Admission medications   Medication Sig Start Date End Date Taking? Authorizing Provider  traMADol (ULTRAM) 50 MG tablet Take 1 tablet (50 mg total) by mouth every 6 (six) hours as needed. 03/26/22  Yes Athalene Kolle, Janace Aris, MD  valACYclovir (VALTREX) 1000 MG tablet Take 1 tablet (1,000 mg total) by mouth 3 (three) times daily for 7 days. 03/26/22 04/02/22 Yes Caterine Mcmeans, Janace Aris, MD  fluvastatin (LESCOL) 40 MG capsule Take 40 mg by mouth at bedtime. Take 2 capsules by mouth daily    [provider]  hydrochlorothiazide (HYDRODIURIL) 25 MG tablet 25 mg daily. Takes 1 tab in the morning 02/04/16   [provider]   lisinopril (ZESTRIL) 10 MG tablet Take 10 mg by mouth daily. 01/30/20   [provider]  metFORMIN (GLUCOPHAGE) 500 MG tablet Take 1 tablet (500 mg total) by mouth daily with breakfast. 03/19/22   Alois Cliche, PA-C  Vitamin D, Ergocalciferol, (DRISDOL) 1.25 MG (50000 UNIT) CAPS capsule Take 1 capsule (50,000 Units total) by mouth every 7 (seven) days. 03/19/22   Alois Cliche, PA-C  XARELTO 2.5 MG TABS tablet TAKE 2 TABLETS BY MOUTH EVERY DAY 12/28/20   Josph Macho, MD    Family History Family History  Problem Relation Age of Onset   Hypertension Mother    Alcoholism Mother    Obesity Mother    Alcoholism Father    Hyperlipidemia Father    Heart attack Father    Hypertension Father    Heart disease Father    Stomach cancer Sister    Pancreatic cancer Brother    Cancer Brother        pancreatic    Social History Social History   Tobacco Use   Smoking status: Former    Packs/day: 0.50    Years: 11.00    Pack years: 5.50    Types: Cigarettes    Start date: 08/26/1974    Quit date: 05/16/1985    Years since quitting: 36.8   Smokeless tobacco: Never   Tobacco comments:    quit 28 years ago  Vaping Use   Vaping Use: Never used  Substance Use Topics   Alcohol use: Yes    Alcohol/week: 1.0 standard drink    Types: 1 Glasses of wine per week    Comment: Occ. wine   Drug use: No     Allergies   Simvastatin   Review of Systems Review of Systems  Skin:  Positive for rash.    Physical Exam Triage Vital Signs ED Triage Vitals  Enc Vitals Group     BP 03/26/22 1812 (!) 144/79     Pulse Rate 03/26/22 1812 92     Resp 03/26/22 1812 18     Temp 03/26/22 1812 98 F (36.7 C)     Temp Source 03/26/22 1812 Oral     SpO2 03/26/22 1812 98 %     Weight --      Height --      Head Circumference --      Peak Flow --      Pain Score 03/26/22 1811 0     Pain Loc --      Pain Edu? --      Excl. in GC? --    No data found.  Updated Vital Signs BP (!)  144/79   Pulse 92   Temp 98 F (36.7 C) (Oral)   Resp 18   SpO2 98%   Visual Acuity Right Eye Distance:   Left Eye Distance:   Bilateral Distance:    Right Eye Near:   Left Eye Near:    Bilateral Near:     Physical Exam Vitals reviewed.  Constitutional:      General: She is not in acute distress.    Appearance: She is not toxic-appearing.  Cardiovascular:     Rate and Rhythm: Normal rate and regular rhythm.     Heart sounds: No murmur heard. Pulmonary:     Effort: Pulmonary effort is normal.     Breath sounds: Normal breath sounds.  Genitourinary:    Comments: There is a vesicular rash on her upper buttock on the right near the gluteal crevice.  There is also vesicular rash on her right posterior perineum.  No ulcerations Musculoskeletal:     Cervical back: Neck supple.  Lymphadenopathy:     Cervical: No cervical adenopathy.  Neurological:     General: No focal deficit present.     Mental Status: She is alert and oriented to person, place, and time.  Psychiatric:        Behavior: Behavior normal.     UC Treatments / Results  Labs (all labs ordered are listed, but only abnormal results are displayed) Labs Reviewed - No data to display  EKG   Radiology No results found.  Procedures Procedures (including critical care time)  Medications Ordered in UC Medications - No data to display  Initial Impression / Assessment and Plan / UC Course  I have reviewed the triage vital signs and the nursing notes.  Pertinent labs & imaging results that were available during my care of the patient were reviewed by me and considered in my medical decision making (see chart for details).     Her exam is consistent with shingles.  We will treat with Valtrex and tramadol for pain (is on xarelto for h/o clots; OK on pmp) Final Clinical Impressions(s) / UC Diagnoses   Final diagnoses:  Herpes zoster without complication     Discharge Instructions      Take  valacyclovir 1000 mg--1 tablet 3 times daily for 7 days  Take tramadol 50 mg--1 tablet every 6 hours as needed for pain     ED Prescriptions     Medication Sig Dispense Auth. Provider   valACYclovir (VALTREX) 1000 MG tablet Take 1 tablet (1,000 mg total) by mouth 3 (three) times daily for 7 days. 21 tablet Ardine Iacovelli, Janace Aris, MD   traMADol (ULTRAM) 50 MG tablet Take 1 tablet (50 mg total) by mouth every 6 (six) hours as needed. 10 tablet Marlinda Mike Janace Aris, MD      I have reviewed the PDMP during this encounter.   Zenia Resides, MD 03/26/22 Idamae Lusher, MD 03/26/22 [phone]

## 2022-04-04 ENCOUNTER — Other Ambulatory Visit (INDEPENDENT_AMBULATORY_CARE_PROVIDER_SITE_OTHER): Payer: Self-pay | Admitting: Physician Assistant

## 2022-04-04 DIAGNOSIS — E559 Vitamin D deficiency, unspecified: Secondary | ICD-10-CM

## 2022-04-10 ENCOUNTER — Ambulatory Visit (INDEPENDENT_AMBULATORY_CARE_PROVIDER_SITE_OTHER): Payer: 59 | Admitting: Family Medicine

## 2022-04-10 VITALS — BP 146/87 | HR 84 | Temp 97.5°F | Ht 64.0 in | Wt 180.0 lb

## 2022-04-10 DIAGNOSIS — E66811 Obesity, class 1: Secondary | ICD-10-CM

## 2022-04-10 DIAGNOSIS — R7303 Prediabetes: Secondary | ICD-10-CM

## 2022-04-10 DIAGNOSIS — E559 Vitamin D deficiency, unspecified: Secondary | ICD-10-CM

## 2022-04-10 DIAGNOSIS — Z6830 Body mass index (BMI) 30.0-30.9, adult: Secondary | ICD-10-CM | POA: Diagnosis not present

## 2022-04-10 DIAGNOSIS — Z9189 Other specified personal risk factors, not elsewhere classified: Secondary | ICD-10-CM

## 2022-04-10 DIAGNOSIS — E669 Obesity, unspecified: Secondary | ICD-10-CM | POA: Diagnosis not present

## 2022-04-10 MED ORDER — VITAMIN D (ERGOCALCIFEROL) 1.25 MG (50000 UNIT) PO CAPS: 50000.0000 [IU] | ORAL_CAPSULE | ORAL | 0 refills | Status: DC

## 2022-04-10 MED ORDER — METFORMIN HCL 500 MG PO TABS: 500.0000 mg | ORAL_TABLET | Freq: Every day | ORAL | 0 refills | Status: DC

## 2022-04-23 NOTE — Progress Notes (Signed)
? ? ? ?Chief Complaint:  ? ?OBESITY ?Mallory Morales is here to discuss her progress with her obesity treatment plan along with follow-up of her obesity related diagnoses. [name] is on the Category 2 Plan and states she is following her eating plan approximately 80% of the time. [name] states she is doing 0 minutes 0 times per week. ? ?Today's visit was #: 8 ?Starting weight: 191 lbs ?Starting date: 12/18/2021 ?Today's weight: 180 lbs ?Today's date: 04/10/2022 ?Total lbs lost to date: 11 ?Total lbs lost since last in-office visit: 4 ? ?Interim History: [name] continues to do well with weight loss. She is working on meeting her protein goals. She will be going on a cruise soon and she is looking for cruise strategies to minimize weight gain.  ? ?Subjective:  ? ?1. Pre-diabetes ?Mallory Morales continues to do well with her diet and weight loss.  ? ?2. Vitamin D deficiency ?Mallory Morales is doing well with her Vitamin D, but her level is not yet at goal.  ? ?3. At risk for heart disease ?Mallory Morales is at higher than average risk for cardiovascular disease due to obesity. ? ?Assessment/Plan:  ? ?1. Pre-diabetes ?We will refill metformin for 1 month. Sanjuana will continue to work on weight loss, exercise, and decreasing simple carbohydrates to help decrease the risk of diabetes.  ? ?- metFORMIN (GLUCOPHAGE) 500 MG tablet; Take 1 tablet (500 mg total) by mouth daily with breakfast.  Dispense: 30 tablet; Refill: 0 ? ?2. Vitamin D deficiency ?We will refill prescription Vitamin D for 1 month. Guelda will follow-up for routine testing of Vitamin D, at least 2-3 times per year to avoid over-replacement. ? ?- Vitamin D, Ergocalciferol, (DRISDOL) 1.25 MG (50000 UNIT) CAPS capsule; Take 1 capsule (50,000 Units total) by mouth every 7 (seven) days.  Dispense: 4 capsule; Refill: 0 ? ?3. At risk for heart disease ?[name] was given approximately 15 minutes of coronary artery disease prevention counseling today. She is 66 y.o. female and has risk  factors for heart disease including obesity. We discussed intensive lifestyle modifications today with an emphasis on specific weight loss instructions and strategies. ? ?Repetitive spaced learning was employed today to elicit superior memory formation and behavioral change.  ? ?4. Obesity wit Current BMI of 30.9 ?Mallory Morales is currently in the action stage of change. As such, her goal is to continue with weight loss efforts. She has agreed to the Category 2 Plan.  ? ?Behavioral modification strategies: increasing lean protein intake, travel eating strategies, and celebration eating strategies. ? ?Mallory Morales has agreed to follow-up with our clinic in 4 weeks. She was informed of the importance of frequent follow-up visits to maximize her success with intensive lifestyle modifications for her multiple health conditions.  ? ?Objective:  ? ?Blood pressure (!) 146/87, pulse 84, temperature (!) 97.5 ?F (36.4 ?C), height '5\' 4"'$  (1.626 m), weight 180 lb (81.6 kg), SpO2 100 %. ?Body mass index is 30.9 kg/m?. ? ?General: Cooperative, alert, well developed, in no acute distress. ?HEENT: Conjunctivae and lids unremarkable. ?Cardiovascular: Regular rhythm.  ?Lungs: Normal work of breathing. ?Neurologic: No focal deficits.  ? ?Lab Results  ?Component Value Date  ? CREATININE 0.84 01/31/2022  ? BUN 12 01/31/2022  ? NA 142 01/31/2022  ? K 4.4 01/31/2022  ? CL 104 01/31/2022  ? CO2 30 01/31/2022  ? ?Lab Results  ?Component Value Date  ? ALT 16 01/31/2022  ? AST 18 01/31/2022  ? ALKPHOS 55 01/31/2022  ? BILITOT 0.9 01/31/2022  ? ?Lab Results  ?  Component Value Date  ? HGBA1C 6.4 (H) 12/18/2021  ? ?Lab Results  ?Component Value Date  ? INSULIN 18.3 12/18/2021  ? ?Lab Results  ?Component Value Date  ? TSH 1.270 12/18/2021  ? ?Lab Results  ?Component Value Date  ? CHOL 262 (H) 12/18/2021  ? HDL 84 12/18/2021  ? LDLCALC 165 (H) 12/18/2021  ? TRIG 79 12/18/2021  ? CHOLHDL 3.1 12/18/2021  ? ?Lab Results  ?Component Value Date  ? VD25OH 6.7 (L)  12/18/2021  ? ?Lab Results  ?Component Value Date  ? WBC 4.0 01/31/2022  ? HGB 13.1 01/31/2022  ? HCT 37.4 01/31/2022  ? MCV 88.8 01/31/2022  ? PLT 144 (L) 01/31/2022  ? ?Lab Results  ?Component Value Date  ? IRON 121 01/31/2022  ? TIBC 375 01/31/2022  ? FERRITIN 457 (H) 01/31/2022  ? ?Attestation Statements:  ? ?Reviewed by clinician on day of visit: allergies, medications, problem list, medical history, surgical history, family history, social history, and previous encounter notes. ? ? ?I, Trixie Dredge, am acting as transcriptionist for Dennard Nip, MD. ? ?I have reviewed the above documentation for accuracy and completeness, and I agree with the above. -  Dennard Nip, MD ? ? ?

## 2022-05-12 ENCOUNTER — Ambulatory Visit (INDEPENDENT_AMBULATORY_CARE_PROVIDER_SITE_OTHER): Payer: 59 | Admitting: Family Medicine

## 2022-05-12 ENCOUNTER — Ambulatory Visit (INDEPENDENT_AMBULATORY_CARE_PROVIDER_SITE_OTHER): Payer: 59 | Admitting: Physician Assistant

## 2022-05-13 ENCOUNTER — Encounter (INDEPENDENT_AMBULATORY_CARE_PROVIDER_SITE_OTHER): Payer: Self-pay | Admitting: Family Medicine

## 2022-05-13 ENCOUNTER — Ambulatory Visit (INDEPENDENT_AMBULATORY_CARE_PROVIDER_SITE_OTHER): Payer: 59 | Admitting: Family Medicine

## 2022-05-13 VITALS — BP 145/81 | HR 79 | Temp 97.6°F | Ht 64.0 in | Wt 185.0 lb

## 2022-05-13 DIAGNOSIS — I10 Essential (primary) hypertension: Secondary | ICD-10-CM | POA: Diagnosis not present

## 2022-05-13 DIAGNOSIS — E559 Vitamin D deficiency, unspecified: Secondary | ICD-10-CM | POA: Diagnosis not present

## 2022-05-13 DIAGNOSIS — Z6831 Body mass index (BMI) 31.0-31.9, adult: Secondary | ICD-10-CM | POA: Diagnosis not present

## 2022-05-13 DIAGNOSIS — E669 Obesity, unspecified: Secondary | ICD-10-CM | POA: Diagnosis not present

## 2022-05-13 DIAGNOSIS — Z9189 Other specified personal risk factors, not elsewhere classified: Secondary | ICD-10-CM

## 2022-05-13 MED ORDER — VITAMIN D (ERGOCALCIFEROL) 1.25 MG (50000 UNIT) PO CAPS: 50000.0000 [IU] | ORAL_CAPSULE | ORAL | 0 refills | Status: DC

## 2022-05-26 NOTE — Progress Notes (Signed)
Chief Complaint:   OBESITY Mallory Morales is here to discuss her progress with her obesity treatment plan along with follow-up of her obesity related diagnoses. Mallory Morales is on the Category 2 Plan and states she is following her eating plan approximately 0% of the time. Mallory Morales states she is doing 0 minutes 0 times per week.  Today's visit was #: 9 Starting weight: 191 lbs Starting date: 12/18/2021 Today's weight: 185 lbs Today's date: 05/13/2022 Total lbs lost to date: 6 Total lbs lost since last in-office visit: 0  Interim History: Mallory Morales has been on vacation and she did some celebration eating. She is ready to get back on track and she is open to looking at other eating plan options.   Subjective:   1. Vitamin D deficiency Mallory Morales is on Vitamin D, and her last level was very low. No side effects were noted.   2. Essential hypertention Mallory Morales's blood pressure is elevated today, but is normally well controlled. She denies chest pain or headache.   3. At risk for heart disease Mallory Morales is at higher than average risk for cardiovascular disease due to obesity.  Assessment/Plan:   1. Vitamin D deficiency We will refill prescription Vitamin D for 1 month. Mallory Morales will follow-up for routine testing of Vitamin D, at least 2-3 times per year to avoid over-replacement.  - Vitamin D, Ergocalciferol, (DRISDOL) 1.25 MG (50000 UNIT) CAPS capsule; Take 1 capsule (50,000 Units total) by mouth every 7 (seven) days.  Dispense: 4 capsule; Refill: 0  2. Essential hypertention Mallory Morales will continue her diet and exercise to improve blood pressure control. We will recheck her blood pressure in 2-3 weeks.   3. At risk for heart disease Mallory Morales was given approximately 15 minutes of coronary artery disease prevention counseling today. She is 66 y.o. female and has risk factors for heart disease including obesity. We discussed intensive lifestyle modifications today with an emphasis on specific weight  loss instructions and strategies.  Repetitive spaced learning was employed today to elicit superior memory formation and behavioral change.   4. Obesity, Current BMI 31.8 Mallory Morales is currently in the action stage of change. As such, her goal is to continue with weight loss efforts. She has agreed to change to following a lower carbohydrate, vegetable and lean protein rich diet plan.   Behavioral modification strategies: increasing lean protein intake and increasing water intake.  Mallory Morales has agreed to follow-up with our clinic in 2 to 3 weeks. She was informed of the importance of frequent follow-up visits to maximize her success with intensive lifestyle modifications for her multiple health conditions.   Objective:   Blood pressure (!) 145/81, pulse 79, temperature 97.6 F (36.4 C), height '5\' 4"'$  (1.626 m), weight 185 lb (83.9 kg), SpO2 99 %. Body mass index is 31.76 kg/m.  General: Cooperative, alert, well developed, in no acute distress. HEENT: Conjunctivae and lids unremarkable. Cardiovascular: Regular rhythm.  Lungs: Normal work of breathing. Neurologic: No focal deficits.   Lab Results  Component Value Date   CREATININE 0.84 01/31/2022   BUN 12 01/31/2022   NA 142 01/31/2022   K 4.4 01/31/2022   CL 104 01/31/2022   CO2 30 01/31/2022   Lab Results  Component Value Date   ALT 16 01/31/2022   AST 18 01/31/2022   ALKPHOS 55 01/31/2022   BILITOT 0.9 01/31/2022   Lab Results  Component Value Date   HGBA1C 6.4 (H) 12/18/2021   Lab Results  Component Value Date   INSULIN  18.3 12/18/2021   Lab Results  Component Value Date   TSH 1.270 12/18/2021   Lab Results  Component Value Date   CHOL 262 (H) 12/18/2021   HDL 84 12/18/2021   LDLCALC 165 (H) 12/18/2021   TRIG 79 12/18/2021   CHOLHDL 3.1 12/18/2021   Lab Results  Component Value Date   VD25OH 6.7 (L) 12/18/2021   Lab Results  Component Value Date   WBC 4.0 01/31/2022   HGB 13.1 01/31/2022   HCT 37.4  01/31/2022   MCV 88.8 01/31/2022   PLT 144 (L) 01/31/2022   Lab Results  Component Value Date   IRON 121 01/31/2022   TIBC 375 01/31/2022   FERRITIN 457 (H) 01/31/2022   Attestation Statements:   Reviewed by clinician on day of visit: allergies, medications, problem list, medical history, surgical history, family history, social history, and previous encounter notes.   I, Trixie Dredge, am acting as transcriptionist for Dennard Nip, MD.  I have reviewed the above documentation for accuracy and completeness, and I agree with the above. -  Dennard Nip, MD

## 2022-05-30 ENCOUNTER — Other Ambulatory Visit: Payer: Self-pay | Admitting: Hematology & Oncology

## 2022-05-30 DIAGNOSIS — D501 Sideropenic dysphagia: Secondary | ICD-10-CM

## 2022-05-30 DIAGNOSIS — I82413 Acute embolism and thrombosis of femoral vein, bilateral: Secondary | ICD-10-CM

## 2022-05-30 DIAGNOSIS — C186 Malignant neoplasm of descending colon: Secondary | ICD-10-CM

## 2022-06-03 ENCOUNTER — Encounter (INDEPENDENT_AMBULATORY_CARE_PROVIDER_SITE_OTHER): Payer: Self-pay | Admitting: Family Medicine

## 2022-06-03 ENCOUNTER — Ambulatory Visit (INDEPENDENT_AMBULATORY_CARE_PROVIDER_SITE_OTHER): Payer: 59 | Admitting: Family Medicine

## 2022-06-03 VITALS — BP 120/74 | HR 84 | Temp 97.5°F | Ht 64.0 in | Wt 184.0 lb

## 2022-06-03 DIAGNOSIS — E559 Vitamin D deficiency, unspecified: Secondary | ICD-10-CM

## 2022-06-03 DIAGNOSIS — Z6831 Body mass index (BMI) 31.0-31.9, adult: Secondary | ICD-10-CM

## 2022-06-03 DIAGNOSIS — Z9189 Other specified personal risk factors, not elsewhere classified: Secondary | ICD-10-CM

## 2022-06-03 DIAGNOSIS — I10 Essential (primary) hypertension: Secondary | ICD-10-CM | POA: Diagnosis not present

## 2022-06-03 DIAGNOSIS — E669 Obesity, unspecified: Secondary | ICD-10-CM | POA: Diagnosis not present

## 2022-06-03 DIAGNOSIS — R7303 Prediabetes: Secondary | ICD-10-CM | POA: Diagnosis not present

## 2022-06-03 DIAGNOSIS — Z7984 Long term (current) use of oral hypoglycemic drugs: Secondary | ICD-10-CM

## 2022-06-03 MED ORDER — METFORMIN HCL 500 MG PO TABS: 500.0000 mg | ORAL_TABLET | Freq: Every day | ORAL | 0 refills | Status: DC

## 2022-06-03 MED ORDER — VITAMIN D (ERGOCALCIFEROL) 1.25 MG (50000 UNIT) PO CAPS: 50000.0000 [IU] | ORAL_CAPSULE | ORAL | 0 refills | Status: DC

## 2022-06-04 NOTE — Progress Notes (Signed)
Chief Complaint:   OBESITY Mallory Morales is here to discuss her progress with her obesity treatment plan along with follow-up of her obesity related diagnoses. Mallory Morales is on following a lower carbohydrate, vegetable and lean protein rich diet plan and states she is following her eating plan approximately 40% of the time. Mallory Morales states she is doing 0 minutes 0 times per week.  Today's visit was #: 10 Starting weight: 191 lbs Starting date: 12/18/2021 Today's weight: 184 lbs Today's date: 06/03/2022 Total lbs lost to date: 7 Total lbs lost since last in-office visit: 1  Interim History: Mallory Morales continues to work on her weight loss.  She was changed to the low carbohydrate plan but she struggled to follow this as closely.  She is open to other meal plan options.  Subjective:   1. Essential hypertention Mallory Morales's blood pressure is better controlled than her last 2 visits.  2. Vitamin D deficiency Mallory Morales's vitamin D level is not yet at goal.  She is on vitamin D prescription with no side effects.  3. Pre-diabetes Mallory Morales is working on decreasing simple carbohydrates.  She is stable on metformin, with no side effects noted.  4. At risk for heart disease Mallory Morales is at higher than average risk for cardiovascular disease due to obesity.  Assessment/Plan:   1. Essential hypertention Mallory Morales will continue her diet, exercise, and lisinopril, and will continue to follow-up.  2. Vitamin D deficiency We will refill prescription Vitamin D for 1 month.  Mallory Morales will follow-up for routine testing of Vitamin D, at least 2-3 times per year to avoid over-replacement.  - Vitamin D, Ergocalciferol, (DRISDOL) 1.25 MG (50000 UNIT) CAPS capsule; Take 1 capsule (50,000 Units total) by mouth every 7 (seven) days.  Dispense: 4 capsule; Refill: 0  3. Pre-diabetes We will refill metformin for 1 month.  Mallory Morales will continue to work on weight loss, exercise, and decreasing simple carbohydrates to help  decrease the risk of diabetes.   - metFORMIN (GLUCOPHAGE) 500 MG tablet; Take 1 tablet (500 mg total) by mouth daily with breakfast.  Dispense: 30 tablet; Refill: 0  4. At risk for heart disease Mallory Morales was given approximately 15 minutes of coronary artery disease prevention counseling today. She is 66 y.o. female and has risk factors for heart disease including obesity. We discussed intensive lifestyle modifications today with an emphasis on specific weight loss instructions and strategies.  Repetitive spaced learning was employed today to elicit superior memory formation and behavioral change.   5. Obesity, Current BMI 31.7 Mallory Morales is currently in the action stage of change. As such, her goal is to continue with weight loss efforts. She has agreed to change to the [employer].   Behavioral modification strategies: increasing lean protein intake.  Mallory Morales has agreed to follow-up with our clinic in 3 weeks. She was informed of the importance of frequent follow-up visits to maximize her success with intensive lifestyle modifications for her multiple health conditions.   Objective:   Blood pressure 120/74, pulse 84, temperature (!) 97.5 F (36.4 C), height '5\' 4"'$  (1.626 m), weight 184 lb (83.5 kg), SpO2 98 %. Body mass index is 31.58 kg/m.  General: Cooperative, alert, well developed, in no acute distress. HEENT: Conjunctivae and lids unremarkable. Cardiovascular: Regular rhythm.  Lungs: Normal work of breathing. Neurologic: No focal deficits.   Lab Results  Component Value Date   CREATININE 0.84 01/31/2022   BUN 12 01/31/2022   NA 142 01/31/2022   K 4.4 01/31/2022   CL  104 01/31/2022   CO2 30 01/31/2022   Lab Results  Component Value Date   ALT 16 01/31/2022   AST 18 01/31/2022   ALKPHOS 55 01/31/2022   BILITOT 0.9 01/31/2022   Lab Results  Component Value Date   HGBA1C 6.4 (H) 12/18/2021   Lab Results  Component Value Date   INSULIN 18.3 12/18/2021   Lab  Results  Component Value Date   TSH 1.270 12/18/2021   Lab Results  Component Value Date   CHOL 262 (H) 12/18/2021   HDL 84 12/18/2021   LDLCALC 165 (H) 12/18/2021   TRIG 79 12/18/2021   CHOLHDL 3.1 12/18/2021   Lab Results  Component Value Date   VD25OH 6.7 (L) 12/18/2021   Lab Results  Component Value Date   WBC 4.0 01/31/2022   HGB 13.1 01/31/2022   HCT 37.4 01/31/2022   MCV 88.8 01/31/2022   PLT 144 (L) 01/31/2022   Lab Results  Component Value Date   IRON 121 01/31/2022   TIBC 375 01/31/2022   FERRITIN 457 (H) 01/31/2022   Attestation Statements:   Reviewed by clinician on day of visit: allergies, medications, problem list, medical history, surgical history, family history, social history, and previous encounter notes.   I, Trixie Dredge, am acting as transcriptionist for Dennard Nip, MD.  I have reviewed the above documentation for accuracy and completeness, and I agree with the above. -  Dennard Nip, MD

## 2022-06-25 ENCOUNTER — Encounter (INDEPENDENT_AMBULATORY_CARE_PROVIDER_SITE_OTHER): Payer: Self-pay | Admitting: Family Medicine

## 2022-06-25 ENCOUNTER — Ambulatory Visit (INDEPENDENT_AMBULATORY_CARE_PROVIDER_SITE_OTHER): Payer: 59 | Admitting: Family Medicine

## 2022-06-25 VITALS — BP 129/83 | HR 88 | Temp 97.8°F | Ht 64.0 in | Wt 180.0 lb

## 2022-06-25 DIAGNOSIS — E7849 Other hyperlipidemia: Secondary | ICD-10-CM | POA: Diagnosis not present

## 2022-06-25 DIAGNOSIS — E559 Vitamin D deficiency, unspecified: Secondary | ICD-10-CM

## 2022-06-25 DIAGNOSIS — Z6830 Body mass index (BMI) 30.0-30.9, adult: Secondary | ICD-10-CM

## 2022-06-25 DIAGNOSIS — R7303 Prediabetes: Secondary | ICD-10-CM | POA: Diagnosis not present

## 2022-06-25 DIAGNOSIS — F3289 Other specified depressive episodes: Secondary | ICD-10-CM

## 2022-06-25 DIAGNOSIS — I10 Essential (primary) hypertension: Secondary | ICD-10-CM

## 2022-06-25 DIAGNOSIS — Z9189 Other specified personal risk factors, not elsewhere classified: Secondary | ICD-10-CM

## 2022-06-25 DIAGNOSIS — E669 Obesity, unspecified: Secondary | ICD-10-CM

## 2022-06-25 DIAGNOSIS — E66811 Obesity, class 1: Secondary | ICD-10-CM

## 2022-06-25 MED ORDER — BUPROPION HCL ER (SR) 150 MG PO TB12: 150.0000 mg | ORAL_TABLET | Freq: Every day | ORAL | 0 refills | Status: DC

## 2022-06-26 ENCOUNTER — Encounter (INDEPENDENT_AMBULATORY_CARE_PROVIDER_SITE_OTHER): Payer: Self-pay | Admitting: Family Medicine

## 2022-06-26 LAB — CMP14+EGFR
ALT: 17 IU/L (ref 0–32)
AST: 19 IU/L (ref 0–40)
Albumin/Globulin Ratio: 2.1 (ref 1.2–2.2)
Albumin: 4.8 g/dL (ref 3.8–4.8)
Alkaline Phosphatase: 70 IU/L (ref 44–121)
BUN/Creatinine Ratio: 16 (ref 12–28)
BUN: 12 mg/dL (ref 8–27)
Bilirubin Total: 0.4 mg/dL (ref 0.0–1.2)
CO2: 22 mmol/L (ref 20–29)
Calcium: 9.5 mg/dL (ref 8.7–10.3)
Chloride: 105 mmol/L (ref 96–106)
Creatinine, Ser: 0.76 mg/dL (ref 0.57–1.00)
Globulin, Total: 2.3 g/dL (ref 1.5–4.5)
Glucose: 96 mg/dL (ref 70–99)
Potassium: 3.9 mmol/L (ref 3.5–5.2)
Sodium: 143 mmol/L (ref 134–144)
Total Protein: 7.1 g/dL (ref 6.0–8.5)
eGFR: 88 mL/min/{1.73_m2} (ref >59)

## 2022-06-26 LAB — CBC WITH DIFFERENTIAL/PLATELET
Basophils Absolute: 0 10*3/uL (ref 0.0–0.2)
Basos: 1 %
EOS (ABSOLUTE): 0.1 10*3/uL (ref 0.0–0.4)
Eos: 3 %
Hematocrit: 38.5 % (ref 34.0–46.6)
Hemoglobin: 13.6 g/dL (ref 11.1–15.9)
Immature Grans (Abs): 0 10*3/uL (ref 0.0–0.1)
Immature Granulocytes: 0 %
Lymphocytes Absolute: 1.7 10*3/uL (ref 0.7–3.1)
Lymphs: 41 %
MCH: 31.8 pg (ref 26.6–33.0)
MCHC: 35.3 g/dL (ref 31.5–35.7)
MCV: 90 fL (ref 79–97)
Monocytes Absolute: 0.3 10*3/uL (ref 0.1–0.9)
Monocytes: 7 %
Neutrophils Absolute: 2 10*3/uL (ref 1.4–7.0)
Neutrophils: 48 %
Platelets: 132 10*3/uL — ABNORMAL LOW (ref 150–450)
RBC: 4.28 x10E6/uL (ref 3.77–5.28)
RDW: 12.9 % (ref 11.7–15.4)
WBC: 4 10*3/uL (ref 3.4–10.8)

## 2022-06-26 LAB — LIPID PANEL WITH LDL/HDL RATIO
Cholesterol, Total: 220 mg/dL — ABNORMAL HIGH (ref 100–199)
HDL: 78 mg/dL (ref >39)
LDL Chol Calc (NIH): 119 mg/dL — ABNORMAL HIGH (ref 0–99)
LDL/HDL Ratio: 1.5 ratio (ref 0.0–3.2)
Triglycerides: 132 mg/dL (ref 0–149)
VLDL Cholesterol Cal: 23 mg/dL (ref 5–40)

## 2022-06-26 LAB — VITAMIN B12: Vitamin B-12: 1114 pg/mL (ref 232–1245)

## 2022-06-26 LAB — HEMOGLOBIN A1C
Est. average glucose Bld gHb Est-mCnc: 123 mg/dL
Hgb A1c MFr Bld: 5.9 % — ABNORMAL HIGH (ref 4.8–5.6)

## 2022-06-26 LAB — VITAMIN D 25 HYDROXY (VIT D DEFICIENCY, FRACTURES): Vit D, 25-Hydroxy: 96.3 ng/mL (ref 30.0–100.0)

## 2022-06-26 LAB — INSULIN, RANDOM: INSULIN: 9.7 u[IU]/mL (ref 2.6–24.9)

## 2022-06-26 NOTE — Progress Notes (Signed)
Chief Complaint:   OBESITY Mallory Morales is here to discuss her progress with her obesity treatment plan along with follow-up of her obesity related diagnoses. Mallory Morales is on the [employer] and states she is following her eating plan approximately 40% of the time. Mallory Morales states she is doing 0 minutes 0 times per week.  Today's visit was #: 11 Starting weight: 191 lbs Starting date: 12/18/2021 Today's weight: 180 lbs Today's date: 06/25/2022 Total lbs lost to date: 11 Total lbs lost since last in-office visit: 4  Interim History: Mallory Morales continues to do well with diet and weight loss.  She is happy with her pescatarian plan, and her hunger is mostly controlled but she is struggling with cravings.  Subjective:   1. Pre-diabetes [name] is working on her diet, and she is due for labs.  2. Vitamin D deficiency Mallory Morales's last vitamin D level was very low.  She is due for labs, and she denies side effects.  3. Hyperlipidemia, pure Mallory Morales is on Lescol, and she is working on her diet and exercise.  4. Essential hypertention [name]'s blood pressure is stable on her medications, with no side effects noted.  She has no signs of hypotension.  5. Other depression with emotional eating Mallory Morales has a new diagnosis of depression with emotional eating.  She notes struggling with increased cravings and emotional eating behaviors.  6. At risk for diabetes mellitus Mallory Morales is at higher than average risk for developing diabetes due to her obesity.  Assessment/Plan:   1. Pre-diabetes We will check labs today. Mallory Morales will continue to work on weight loss, exercise, and decreasing simple carbohydrates to help decrease the risk of diabetes.  We will follow-up at her next visit.  - Insulin, random - Hemoglobin A1c - Vitamin B12  2. Vitamin D deficiency We will check labs today, and we will follow-up at [name] visit.  - VITAMIN D 25 Hydroxy (Vit-D Deficiency, Fractures)  3.  Hyperlipidemia, pure We will check labs today.  Mallory Morales will continue to work on her diet and exercise, and we will follow-up at her next visit.  - CBC with Differential/Platelet - Lipid Panel With LDL/HDL Ratio  4. Essential hypertention We will check labs today. [name] will continue working on her diet and exercise to improve blood pressure control. We will follow-up at her next visit.  - CMP14+EGFR  5. Other depression with emotional eating Mallory Morales agreed to start Wellbutrin SR 150 mg every morning, with no refills.  Behavior modification techniques were discussed today to help [name] deal with her emotional/non-hunger eating behaviors.  Orders and follow up as documented in patient record.   - buPROPion (WELLBUTRIN SR) 150 MG 12 hr tablet; Take 1 tablet (150 mg total) by mouth daily.  Dispense: 30 tablet; Refill: 0  6. At risk for diabetes mellitus Mallory Morales was given approximately 15 minutes of diabetic education and counseling today. We discussed intensive lifestyle modifications today with an emphasis on weight loss as well as increasing exercise and decreasing simple carbohydrates in her diet. We also reviewed medication options with an emphasis on risk versus benefits of those discussed.  Repetitive spaced learning was employed today to elicit superior memory formation and behavioral change.  7. Obesity, Current BMI 30.9 Mallory Morales is currently in the action stage of change. As such, her goal is to continue with weight loss efforts. She has agreed to the [employer].   Behavioral modification strategies: increasing lean protein intake.  Mallory Morales has agreed to follow-up with  our clinic in 3 weeks. She was informed of the importance of frequent follow-up visits to maximize her success with intensive lifestyle modifications for her multiple health conditions.   Mallory Morales was informed we would discuss her lab results at her next visit unless there is a critical issue that needs to be  addressed sooner. Mallory Morales agreed to keep her next visit at the agreed upon time to discuss these results.  Objective:   Blood pressure 129/83, pulse 88, temperature 97.8 F (36.6 C), height $RemoveBe'5\' 4"'FpkekpToK$  (1.626 m), weight 180 lb (81.6 kg), SpO2 96 %. Body mass index is 30.9 kg/m.  General: Cooperative, alert, well developed, in no acute distress. HEENT: Conjunctivae and lids unremarkable. Cardiovascular: Regular rhythm.  Lungs: Normal work of breathing. Neurologic: No focal deficits.   Lab Results  Component Value Date   CREATININE 0.84 01/31/2022   BUN 12 01/31/2022   NA 142 01/31/2022   K 4.4 01/31/2022   CL 104 01/31/2022   CO2 30 01/31/2022   Lab Results  Component Value Date   ALT 16 01/31/2022   AST 18 01/31/2022   ALKPHOS 55 01/31/2022   BILITOT 0.9 01/31/2022   Lab Results  Component Value Date   HGBA1C 6.4 (H) 12/18/2021   Lab Results  Component Value Date   INSULIN 18.3 12/18/2021   Lab Results  Component Value Date   TSH 1.270 12/18/2021   Lab Results  Component Value Date   CHOL 262 (H) 12/18/2021   HDL 84 12/18/2021   LDLCALC 165 (H) 12/18/2021   TRIG 79 12/18/2021   CHOLHDL 3.1 12/18/2021   Lab Results  Component Value Date   VD25OH 6.7 (L) 12/18/2021   Lab Results  Component Value Date   WBC 4.0 01/31/2022   HGB 13.1 01/31/2022   HCT 37.4 01/31/2022   MCV 88.8 01/31/2022   PLT 144 (L) 01/31/2022   Lab Results  Component Value Date   IRON 121 01/31/2022   TIBC 375 01/31/2022   FERRITIN 457 (H) 01/31/2022   Attestation Statements:   Reviewed by clinician on day of visit: allergies, medications, problem list, medical history, surgical history, family history, social history, and previous encounter notes.   I, Trixie Dredge, am acting as transcriptionist for Dennard Nip, MD.  I have reviewed the above documentation for accuracy and completeness, and I agree with the above. -  Dennard Nip, MD

## 2022-07-16 ENCOUNTER — Ambulatory Visit (INDEPENDENT_AMBULATORY_CARE_PROVIDER_SITE_OTHER): Payer: 59 | Admitting: Family Medicine

## 2022-07-16 ENCOUNTER — Encounter (INDEPENDENT_AMBULATORY_CARE_PROVIDER_SITE_OTHER): Payer: Self-pay | Admitting: Family Medicine

## 2022-07-16 VITALS — BP 138/80 | HR 85 | Temp 98.0°F | Ht 64.0 in | Wt 182.0 lb

## 2022-07-16 DIAGNOSIS — Z6831 Body mass index (BMI) 31.0-31.9, adult: Secondary | ICD-10-CM

## 2022-07-16 DIAGNOSIS — E669 Obesity, unspecified: Secondary | ICD-10-CM

## 2022-07-16 DIAGNOSIS — R7303 Prediabetes: Secondary | ICD-10-CM | POA: Insufficient documentation

## 2022-07-16 DIAGNOSIS — F3289 Other specified depressive episodes: Secondary | ICD-10-CM | POA: Diagnosis not present

## 2022-07-16 DIAGNOSIS — I10 Essential (primary) hypertension: Secondary | ICD-10-CM | POA: Diagnosis not present

## 2022-07-16 MED ORDER — METFORMIN HCL 500 MG PO TABS: 500.0000 mg | ORAL_TABLET | Freq: Every day | ORAL | 0 refills | Status: DC

## 2022-07-16 MED ORDER — HYDROCHLOROTHIAZIDE 25 MG PO TABS
25.0000 mg | ORAL_TABLET | Freq: Every day | ORAL | 0 refills | Status: DC
Start: 2022-07-16 — End: 2022-11-06

## 2022-07-18 ENCOUNTER — Encounter (INDEPENDENT_AMBULATORY_CARE_PROVIDER_SITE_OTHER): Payer: Self-pay | Admitting: Family Medicine

## 2022-07-23 NOTE — Progress Notes (Unsigned)
Chief Complaint:   OBESITY Mallory Morales is here to discuss her progress with her obesity treatment plan along with follow-up of her obesity related diagnoses. Mallory Morales is on the [employer] and states she is following her eating plan approximately 20% of the time. Mallory Morales states she is doing 0 minutes 0 times per week.  Today's visit was #: 12 Starting weight: 191 lbs Starting date: 12/18/2021 Today's weight: 182 lbs Today's date: 07/16/2022 Total lbs lost to date: 9 Total lbs lost since last in-office visit: 0  Interim History: Mallory Morales continues to struggle with weight loss, and she is still deviating from her plan.  She feels that the pescatarian plan is still good and she is ready to follow more closely.  Subjective:   1. Essential hypertention Mallory Morales's blood pressure was elevated today at the beginning of her visit, and improved with the recheck at the end of her visit.  2. Pre-diabetes Mallory Morales continues to work on decreasing simple carbohydrates.  She denies nausea, vomiting, or hypoglycemia.  3. Other depression with emotional eating Mallory Morales was prescribed Wellbutrin at her last visit, but she read the side effects on the Internet and was concerned about possible side effects.  Assessment/Plan:   1. Essential hypertention We will refill hydrochlorothiazide 25 mg once daily for 1 month.  We will follow-up on Mallory Morales's blood pressure at her next visit.  - hydrochlorothiazide (HYDRODIURIL) 25 MG tablet; Take 1 tablet (25 mg total) by mouth daily. Takes 1 tab in the morning  Dispense: 30 tablet; Refill: 0  2. Pre-diabetes Mallory Morales will continue metformin 500 mg every morning with breakfast, and we will refill for 1 month.  - metFORMIN (GLUCOPHAGE) 500 MG tablet; Take 1 tablet (500 mg total) by mouth daily with breakfast.  Dispense: 30 tablet; Refill: 0  3. Other depression with emotional eating We reviewed the actual side effects that are common and patient was  reassured.  Mallory Morales will start Wellbutrin tomorrow and we will follow-up at her next visit in 3 weeks.  4. Obesity, Current BMI 31.4 Mallory Morales is currently in the action stage of change. As such, her goal is to continue with weight loss efforts. She has agreed to the [employer].   Behavioral modification strategies: increasing lean protein intake.  Mallory Morales has agreed to follow-up with our clinic in 3 weeks. She was informed of the importance of frequent follow-up visits to maximize her success with intensive lifestyle modifications for her multiple health conditions.   Objective:   Blood pressure 138/80, pulse 85, temperature 98 F (36.7 C), height '5\' 4"'$  (1.626 m), weight 182 lb (82.6 kg), SpO2 98 %. Body mass index is 31.24 kg/m.  General: Cooperative, alert, well developed, in no acute distress. HEENT: Conjunctivae and lids unremarkable. Cardiovascular: Regular rhythm.  Lungs: Normal work of breathing. Neurologic: No focal deficits.   Lab Results  Component Value Date   CREATININE 0.76 06/25/2022   BUN 12 06/25/2022   NA 143 06/25/2022   K 3.9 06/25/2022   CL 105 06/25/2022   CO2 22 06/25/2022   Lab Results  Component Value Date   ALT 17 06/25/2022   AST 19 06/25/2022   ALKPHOS 70 06/25/2022   BILITOT 0.4 06/25/2022   Lab Results  Component Value Date   HGBA1C 5.9 (H) 06/25/2022   HGBA1C 6.4 (H) 12/18/2021   Lab Results  Component Value Date   INSULIN 9.7 06/25/2022   INSULIN 18.3 12/18/2021   Lab Results  Component Value Date  TSH 1.270 12/18/2021   Lab Results  Component Value Date   CHOL 220 (H) 06/25/2022   HDL 78 06/25/2022   LDLCALC 119 (H) 06/25/2022   TRIG 132 06/25/2022   CHOLHDL 3.1 12/18/2021   Lab Results  Component Value Date   VD25OH 96.3 06/25/2022   VD25OH 6.7 (L) 12/18/2021   Lab Results  Component Value Date   WBC 4.0 06/25/2022   HGB 13.6 06/25/2022   HCT 38.5 06/25/2022   MCV 90 06/25/2022   PLT 132 (L) 06/25/2022    Lab Results  Component Value Date   IRON 121 01/31/2022   TIBC 375 01/31/2022   FERRITIN 457 (H) 01/31/2022   Attestation Statements:   Reviewed by clinician on day of visit: allergies, medications, problem list, medical history, surgical history, family history, social history, and previous encounter notes.   I, Trixie Dredge, am acting as transcriptionist for Dennard Nip, MD.  I have reviewed the above documentation for accuracy and completeness, and I agree with the above. -  Dennard Nip, MD

## 2022-07-24 ENCOUNTER — Other Ambulatory Visit (INDEPENDENT_AMBULATORY_CARE_PROVIDER_SITE_OTHER): Payer: Self-pay | Admitting: Family Medicine

## 2022-07-24 DIAGNOSIS — R7303 Prediabetes: Secondary | ICD-10-CM

## 2022-07-30 ENCOUNTER — Encounter (INDEPENDENT_AMBULATORY_CARE_PROVIDER_SITE_OTHER): Payer: Self-pay

## 2022-08-01 ENCOUNTER — Inpatient Hospital Stay (HOSPITAL_BASED_OUTPATIENT_CLINIC_OR_DEPARTMENT_OTHER): Payer: 59 | Admitting: Family

## 2022-08-01 ENCOUNTER — Inpatient Hospital Stay: Payer: 59 | Attending: Hematology & Oncology

## 2022-08-01 ENCOUNTER — Other Ambulatory Visit: Payer: Self-pay

## 2022-08-01 ENCOUNTER — Encounter: Payer: Self-pay | Admitting: Family

## 2022-08-01 VITALS — BP 137/74 | HR 79 | Temp 98.2°F | Resp 18 | Ht 64.0 in | Wt 184.1 lb

## 2022-08-01 DIAGNOSIS — D500 Iron deficiency anemia secondary to blood loss (chronic): Secondary | ICD-10-CM

## 2022-08-01 DIAGNOSIS — C186 Malignant neoplasm of descending colon: Secondary | ICD-10-CM

## 2022-08-01 DIAGNOSIS — Z7901 Long term (current) use of anticoagulants: Secondary | ICD-10-CM | POA: Insufficient documentation

## 2022-08-01 DIAGNOSIS — Z86718 Personal history of other venous thrombosis and embolism: Secondary | ICD-10-CM | POA: Diagnosis not present

## 2022-08-01 DIAGNOSIS — I82411 Acute embolism and thrombosis of right femoral vein: Secondary | ICD-10-CM | POA: Diagnosis not present

## 2022-08-01 DIAGNOSIS — Z85038 Personal history of other malignant neoplasm of large intestine: Secondary | ICD-10-CM | POA: Diagnosis present

## 2022-08-01 LAB — CBC WITH DIFFERENTIAL (CANCER CENTER ONLY)
Abs Immature Granulocytes: 0.01 10*3/uL (ref 0.00–0.07)
Basophils Absolute: 0 10*3/uL (ref 0.0–0.1)
Basophils Relative: 1 %
Eosinophils Absolute: 0.1 10*3/uL (ref 0.0–0.5)
Eosinophils Relative: 2 %
HCT: 38.1 % (ref 36.0–46.0)
Hemoglobin: 13.2 g/dL (ref 12.0–15.0)
Immature Granulocytes: 0 %
Lymphocytes Relative: 36 %
Lymphs Abs: 1.5 10*3/uL (ref 0.7–4.0)
MCH: 30.9 pg (ref 26.0–34.0)
MCHC: 34.6 g/dL (ref 30.0–36.0)
MCV: 89.2 fL (ref 80.0–100.0)
Monocytes Absolute: 0.3 10*3/uL (ref 0.1–1.0)
Monocytes Relative: 6 %
Neutro Abs: 2.3 10*3/uL (ref 1.7–7.7)
Neutrophils Relative %: 55 %
Platelet Count: 143 10*3/uL — ABNORMAL LOW (ref 150–400)
RBC: 4.27 MIL/uL (ref 3.87–5.11)
RDW: 12.2 % (ref 11.5–15.5)
WBC Count: 4.2 10*3/uL (ref 4.0–10.5)
nRBC: 0 % (ref 0.0–0.2)

## 2022-08-01 LAB — IRON AND IRON BINDING CAPACITY (CC-WL,HP ONLY)
Iron: 104 ug/dL (ref 28–170)
Saturation Ratios: 27 % (ref 10.4–31.8)
TIBC: 386 ug/dL (ref 250–450)
UIBC: 282 ug/dL (ref 148–442)

## 2022-08-01 LAB — FERRITIN: Ferritin: 148 ng/mL (ref 11–307)

## 2022-08-01 LAB — CMP (CANCER CENTER ONLY)
ALT: 16 U/L (ref 0–44)
AST: 17 U/L (ref 15–41)
Albumin: 4.8 g/dL (ref 3.5–5.0)
Alkaline Phosphatase: 68 U/L (ref 38–126)
Anion gap: 8 (ref 5–15)
BUN: 15 mg/dL (ref 8–23)
CO2: 30 mmol/L (ref 22–32)
Calcium: 10.1 mg/dL (ref 8.9–10.3)
Chloride: 103 mmol/L (ref 98–111)
Creatinine: 0.79 mg/dL (ref 0.44–1.00)
GFR, Estimated: >60 mL/min (ref >60)
Glucose, Bld: 120 mg/dL — ABNORMAL HIGH (ref 70–99)
Potassium: 4 mmol/L (ref 3.5–5.1)
Sodium: 141 mmol/L (ref 135–145)
Total Bilirubin: 0.8 mg/dL (ref 0.3–1.2)
Total Protein: 6.8 g/dL (ref 6.5–8.1)

## 2022-08-01 LAB — LACTATE DEHYDROGENASE: LDH: 182 U/L (ref 98–192)

## 2022-08-01 LAB — CEA (IN HOUSE-CHCC): CEA (CHCC-In House): 4.84 ng/mL (ref 0.00–5.00)

## 2022-08-01 NOTE — Progress Notes (Signed)
Hematology and Oncology Follow Up Visit  Mallory Morales [id number] [birth year] 66 y.o. 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 She is doing well.  She states that she has noted some constipation with her high protein diet and will try Miralax and also add in a probiotic daily.  CEA in February was stable at 4.76.  She has noted noted andy blood loss. No bruising or petechiae.  No fever, chills, n/v, cough, rash, dizziness, SOB, chest pain or changes in bladder habits.  No swelling, tenderness, numbness or tingling in her extremities at this time.  No falls or syncope.  Appetite and hydration are good. Her weight is stable at 184 lbs.   ECOG Performance Status: 1 - Symptomatic but completely ambulatory  Medications:  Allergies as of 08/01/2022       Reactions   Simvastatin Other (See Comments)   Severe rhabdomyolysis.        Medication List        Accurate as of August 01, 2022  9:46 AM. If you have any questions, ask your nurse or doctor.          buPROPion 150 MG 12 hr tablet Commonly known as: Wellbutrin SR Take 1 tablet (150 mg total) by mouth daily.   fluvastatin 40 MG capsule Commonly known as: LESCOL Take 40 mg by mouth at bedtime. Take 2 capsules by mouth daily   hydrochlorothiazide 25 MG tablet Commonly known as: HYDRODIURIL Take 1 tablet (25 mg total) by mouth daily. Takes 1 tab in the morning   lisinopril 10 MG tablet Commonly known as: ZESTRIL Take 10 mg by mouth daily.   metFORMIN 500 MG tablet Commonly known as: GLUCOPHAGE Take 1 tablet (500 mg total) by mouth daily with breakfast.   Vitamin D  (Ergocalciferol) 1.25 MG (50000 UNIT) Caps capsule Commonly known as: DRISDOL Take 1 capsule (50,000 Units total) by mouth every 7 (seven) days.   Xarelto 2.5 MG Tabs tablet Generic drug: rivaroxaban TAKE 2 TABLETS BY MOUTH DAILY        Allergies:  Allergies  Allergen Reactions   Simvastatin Other (See Comments)    Severe rhabdomyolysis.    Past Medical History, Surgical history, Social history, and Family History were reviewed and updated.  Review of Systems: All other 10 point review of systems is negative.   Physical Exam:  vitals were not taken for this visit.   Wt Readings from Last 3 Encounters:  07/16/22 182 lb (82.6 kg)  06/25/22 180 lb (81.6 kg)  06/03/22 184 lb (83.5 kg)    Ocular: Sclerae unicteric, pupils equal, round and reactive to light Ear-nose-throat: Oropharynx clear, dentition fair Lymphatic: No cervical or supraclavicular adenopathy Lungs no rales or rhonchi, good excursion bilaterally Heart regular rate and rhythm, no murmur appreciated Abd soft, nontender, positive bowel sounds MSK no focal spinal tenderness, no joint edema Neuro: non-focal, well-oriented, appropriate affect Breasts: Deferred   Lab Results  Component Value Date   WBC 4.0 06/25/2022   HGB 13.6 06/25/2022   HCT 38.5 06/25/2022   MCV 90 06/25/2022   PLT 132 (L) 06/25/2022  Lab Results  Component Value Date   FERRITIN 457 (H) 01/31/2022   IRON 121 01/31/2022   TIBC 375 01/31/2022   UIBC 254 01/31/2022   IRONPCTSAT 32 (H) 01/31/2022   Lab Results  Component Value Date   RETICCTPCT 1.2 03/01/2015   RBC 4.28 06/25/2022   RETICCTABS 52.1 03/01/2015   No results found for: "KPAFRELGTCHN", "LAMBDASER", "KAPLAMBRATIO" No results found for: "IGGSERUM", "IGA", "IGMSERUM" No results found for: "TOTALPROTELP", "ALBUMINELP", "A1GS", "A2GS", "BETS", "BETA2SER", "GAMS", "MSPIKE", "SPEI"   Chemistry      Component Value Date/Time   NA 143 06/25/2022 0814   NA 146 (H)  10/16/2017 0804   NA 141 03/13/2017 0807   K 3.9 06/25/2022 0814   K 3.8 10/16/2017 0804   K 3.9 03/13/2017 0807   CL 105 06/25/2022 0814   CL 106 10/16/2017 0804   CO2 22 06/25/2022 0814   CO2 29 10/16/2017 0804   CO2 27 03/13/2017 0807   BUN 12 06/25/2022 0814   BUN 10 10/16/2017 0804   BUN 12.4 03/13/2017 0807   CREATININE 0.76 06/25/2022 0814   CREATININE 0.84 01/31/2022 0904   CREATININE 1.0 10/16/2017 0804   CREATININE 0.8 03/13/2017 0807      Component Value Date/Time   CALCIUM 9.5 06/25/2022 0814   CALCIUM 9.3 10/16/2017 0804   CALCIUM 9.6 03/13/2017 0807   ALKPHOS 70 06/25/2022 0814   ALKPHOS 72 10/16/2017 0804   ALKPHOS 70 03/13/2017 0807   AST 19 06/25/2022 0814   AST 18 01/31/2022 0904   AST 16 03/13/2017 0807   ALT 17 06/25/2022 0814   ALT 16 01/31/2022 0904   ALT 28 10/16/2017 0804   ALT 18 03/13/2017 0807   BILITOT 0.4 06/25/2022 0814   BILITOT 0.9 01/31/2022 0904   BILITOT 0.93 03/13/2017 0807       Impression and Plan: Mallory Morales is a lovely 66 yo African American female with history of stage II adenocarcinoma of the sigmoid colon, Oncotype score was 20. She had 36 lymph nodes that were negative. She had her surgery in January of 2013 and did not require adjuvant treatment.  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 All of her hypercoagulable studies were normal.   She will continue her same regimen with Xarelto 5 mg PO daily maintenance as she continues to enjoy travelling.  Follow-up in 6 months.   Lottie Dawson, NP 8/11/20239:46 AM

## 2022-08-04 ENCOUNTER — Telehealth: Payer: Self-pay | Admitting: *Deleted

## 2022-08-04 NOTE — Telephone Encounter (Signed)
Per 08/01/22 los - called and gave upcoming appointments - confirmed

## 2022-08-07 ENCOUNTER — Ambulatory Visit (INDEPENDENT_AMBULATORY_CARE_PROVIDER_SITE_OTHER): Payer: 59 | Admitting: Family Medicine

## 2022-08-07 ENCOUNTER — Encounter (INDEPENDENT_AMBULATORY_CARE_PROVIDER_SITE_OTHER): Payer: Self-pay | Admitting: Family Medicine

## 2022-08-07 VITALS — BP 123/78 | HR 73 | Temp 97.8°F | Ht 64.0 in | Wt 181.0 lb

## 2022-08-07 DIAGNOSIS — I10 Essential (primary) hypertension: Secondary | ICD-10-CM | POA: Diagnosis not present

## 2022-08-07 DIAGNOSIS — Z6831 Body mass index (BMI) 31.0-31.9, adult: Secondary | ICD-10-CM | POA: Diagnosis not present

## 2022-08-07 DIAGNOSIS — E669 Obesity, unspecified: Secondary | ICD-10-CM

## 2022-08-09 ENCOUNTER — Other Ambulatory Visit (INDEPENDENT_AMBULATORY_CARE_PROVIDER_SITE_OTHER): Payer: Self-pay | Admitting: Family Medicine

## 2022-08-09 DIAGNOSIS — E559 Vitamin D deficiency, unspecified: Secondary | ICD-10-CM

## 2022-08-18 NOTE — Progress Notes (Unsigned)
Chief Complaint:   OBESITY Mallory Morales is here to discuss her progress with her obesity treatment plan along with follow-up of her obesity related diagnoses. Mallory Morales is on the [employer] and states she is following her eating plan approximately 40% of the time. Mallory Morales states she is doing 0 minutes 0 times per week.  Today's visit was #: 13 Starting weight: 191 lbs Starting date: 12/18/2021 Today's weight: 181 lbs Today's date: 08/07/2022 Total lbs lost to date: 10 Total lbs lost since last in-office visit: 1  Interim History: Mallory Morales has had increased stress since her last visit, but she still did well with weight loss.  She is working on increasing her protein intake.  Subjective:   1. Essential hypertention Mallory Morales's blood pressures have mostly range at 120's/70's, much better than previously.  She denies chest pain, headache, or lightheadedness.  Assessment/Plan:   1. Essential hypertention Mallory Morales will continue with her diet and exercise, and we will continue to follow.   2. Obesity, Current BMI 31.1 Mallory Morales is currently in the action stage of change. As such, her goal is to continue with weight loss efforts. She has agreed to the [employer].   Behavioral modification strategies: increasing lean protein intake and meal planning and cooking strategies.  Mallory Morales has agreed to follow-up with our clinic in 3 to 4 weeks. She was informed of the importance of frequent follow-up visits to maximize her success with intensive lifestyle modifications for her multiple health conditions.   Objective:   Blood pressure 123/78, pulse 73, temperature 97.8 F (36.6 C), height '5\' 4"'$  (1.626 m), weight 181 lb (82.1 kg), SpO2 98 %. Body mass index is 31.07 kg/m.  General: Cooperative, alert, well developed, in no acute distress. HEENT: Conjunctivae and lids unremarkable. Cardiovascular: Regular rhythm.  Lungs: Normal work of breathing. Neurologic: No focal deficits.   Lab  Results  Component Value Date   CREATININE 0.79 08/01/2022   BUN 15 08/01/2022   NA 141 08/01/2022   K 4.0 08/01/2022   CL 103 08/01/2022   CO2 30 08/01/2022   Lab Results  Component Value Date   ALT 16 08/01/2022   AST 17 08/01/2022   ALKPHOS 68 08/01/2022   BILITOT 0.8 08/01/2022   Lab Results  Component Value Date   HGBA1C 5.9 (H) 06/25/2022   HGBA1C 6.4 (H) 12/18/2021   Lab Results  Component Value Date   INSULIN 9.7 06/25/2022   INSULIN 18.3 12/18/2021   Lab Results  Component Value Date   TSH 1.270 12/18/2021   Lab Results  Component Value Date   CHOL 220 (H) 06/25/2022   HDL 78 06/25/2022   LDLCALC 119 (H) 06/25/2022   TRIG 132 06/25/2022   CHOLHDL 3.1 12/18/2021   Lab Results  Component Value Date   VD25OH 96.3 06/25/2022   VD25OH 6.7 (L) 12/18/2021   Lab Results  Component Value Date   WBC 4.2 08/01/2022   HGB 13.2 08/01/2022   HCT 38.1 08/01/2022   MCV 89.2 08/01/2022   PLT 143 (L) 08/01/2022   Lab Results  Component Value Date   IRON 104 08/01/2022   TIBC 386 08/01/2022   FERRITIN 148 08/01/2022   Attestation Statements:   Reviewed by clinician on day of visit: allergies, medications, problem list, medical history, surgical history, family history, social history, and previous encounter notes.  Time spent on visit including pre-visit chart review and post-visit care and charting was 27 minutes.   Wilhemena Durie, am acting as  transcriptionist for Dennard Nip, MD.  I have reviewed the above documentation for accuracy and completeness, and I agree with the above. -  Dennard Nip, MD

## 2022-09-02 ENCOUNTER — Other Ambulatory Visit (INDEPENDENT_AMBULATORY_CARE_PROVIDER_SITE_OTHER): Payer: Self-pay | Admitting: Family Medicine

## 2022-09-02 DIAGNOSIS — R7303 Prediabetes: Secondary | ICD-10-CM

## 2022-09-08 ENCOUNTER — Ambulatory Visit (INDEPENDENT_AMBULATORY_CARE_PROVIDER_SITE_OTHER): Payer: 59 | Admitting: Family Medicine

## 2022-09-08 ENCOUNTER — Encounter (INDEPENDENT_AMBULATORY_CARE_PROVIDER_SITE_OTHER): Payer: Self-pay | Admitting: Family Medicine

## 2022-09-08 VITALS — BP 123/75 | HR 89 | Temp 98.1°F | Ht 64.0 in | Wt 183.0 lb

## 2022-09-08 DIAGNOSIS — E66811 Obesity, class 1: Secondary | ICD-10-CM | POA: Insufficient documentation

## 2022-09-08 DIAGNOSIS — Z6831 Body mass index (BMI) 31.0-31.9, adult: Secondary | ICD-10-CM

## 2022-09-08 DIAGNOSIS — E669 Obesity, unspecified: Secondary | ICD-10-CM | POA: Insufficient documentation

## 2022-09-08 DIAGNOSIS — R7303 Prediabetes: Secondary | ICD-10-CM | POA: Diagnosis not present

## 2022-09-08 DIAGNOSIS — I10 Essential (primary) hypertension: Secondary | ICD-10-CM | POA: Diagnosis not present

## 2022-09-08 DIAGNOSIS — E559 Vitamin D deficiency, unspecified: Secondary | ICD-10-CM

## 2022-09-08 DIAGNOSIS — Z6832 Body mass index (BMI) 32.0-32.9, adult: Secondary | ICD-10-CM | POA: Insufficient documentation

## 2022-09-08 MED ORDER — METFORMIN HCL 500 MG PO TABS: 500.0000 mg | ORAL_TABLET | Freq: Every day | ORAL | 0 refills | Status: DC

## 2022-09-09 NOTE — Progress Notes (Signed)
Chief Complaint:   OBESITY Mallory Morales is here to discuss her progress with her obesity treatment plan along with follow-up of her obesity related diagnoses. Mallory Morales is on the [employer] and states she is following her eating plan approximately 10% of the time. Mallory Morales states she is doing 0 minutes 0 times per week.  Today's visit was #: 14 Starting weight: 191 lbs Starting date: 12/18/2021 Today's weight: 183 lbs Today's date: 09/08/2022 Total lbs lost to date: 8 Total lbs lost since last in-office visit: 0  Interim History: Mallory Morales reports struggling with her diet as she has been eating out more. She is not always eating everything on her plan.   Subjective:   1. Vitamin D deficiency Mallory Morales's level has dramatically improved to 96.3 (from 6.7).  She is now taking vitamin D every other week.  2. Pre-diabetes Mallory Morales has not been taking metformin for the past 2 weeks as she ran out.  No side effects were noted.  Her renal function is normal.  3. Essential hypertention Mallory Morales's lisinopril was discontinued due to lip swelling.  She started on amlodipine 2.5 mg once daily.  Assessment/Plan:   1. Vitamin D deficiency Mallory Morales will continue vitamin D every other week, and we will plan to recheck labs at her next visit.  2. Pre-diabetes Mallory Morales will continue metformin 500 mg daily, and we will refill for 1 month.  - metFORMIN (GLUCOPHAGE) 500 MG tablet; Take 1 tablet (500 mg total) by mouth daily.  Dispense: 30 tablet; Refill: 0  3. Essential hypertention Mallory Morales was counseled on monitoring her salt intake, and she will continue her diet.  - amLODipine (NORVASC) 2.5 MG tablet; Take 2.5 mg by mouth daily.  4. Obesity, Current BMI 31.5 Mallory Morales is currently in the action stage of change. As such, her goal is to continue with weight loss efforts. She has agreed to the [employer].   We discussed that it is okay to do celebration eating periodically. We discussed  strategies to meeting her protein and calorie goals.   Behavioral modification strategies: increasing lean protein intake, decreasing simple carbohydrates, no skipping meals, meal planning and cooking strategies, and celebration eating strategies.  Mallory Morales has agreed to follow-up with our clinic in 3 to 4 weeks. She was informed of the importance of frequent follow-up visits to maximize her success with intensive lifestyle modifications for her multiple health conditions.   Objective:   Blood pressure 123/75, pulse 89, temperature 98.1 F (36.7 C), height '5\' 4"'$  (1.626 m), weight 183 lb (83 kg), SpO2 98 %. Body mass index is 31.41 kg/m.  General: Cooperative, alert, well developed, in no acute distress. HEENT: Conjunctivae and lids unremarkable. Cardiovascular: Regular rhythm.  Lungs: Normal work of breathing. Neurologic: No focal deficits.   Lab Results  Component Value Date   CREATININE 0.79 08/01/2022   BUN 15 08/01/2022   NA 141 08/01/2022   K 4.0 08/01/2022   CL 103 08/01/2022   CO2 30 08/01/2022   Lab Results  Component Value Date   ALT 16 08/01/2022   AST 17 08/01/2022   ALKPHOS 68 08/01/2022   BILITOT 0.8 08/01/2022   Lab Results  Component Value Date   HGBA1C 5.9 (H) 06/25/2022   HGBA1C 6.4 (H) 12/18/2021   Lab Results  Component Value Date   INSULIN 9.7 06/25/2022   INSULIN 18.3 12/18/2021   Lab Results  Component Value Date   TSH 1.270 12/18/2021   Lab Results  Component Value Date  CHOL 220 (H) 06/25/2022   HDL 78 06/25/2022   LDLCALC 119 (H) 06/25/2022   TRIG 132 06/25/2022   CHOLHDL 3.1 12/18/2021   Lab Results  Component Value Date   VD25OH 96.3 06/25/2022   VD25OH 6.7 (L) 12/18/2021   Lab Results  Component Value Date   WBC 4.2 08/01/2022   HGB 13.2 08/01/2022   HCT 38.1 08/01/2022   MCV 89.2 08/01/2022   PLT 143 (L) 08/01/2022   Lab Results  Component Value Date   IRON 104 08/01/2022   TIBC 386 08/01/2022   FERRITIN 148  08/01/2022   Attestation Statements:   Reviewed by clinician on day of visit: allergies, medications, problem list, medical history, surgical history, family history, social history, and previous encounter notes.  I have personally spent 61 minutes total time today in preparation, patient care, and documentation for this visit, including the following: review of clinical lab tests; review of medical tests/procedures/services.   I, Trixie Dredge, am acting as transcriptionist for Dennard Nip, MD.  I have reviewed the above documentation for accuracy and completeness, and I agree with the above. -  Dennard Nip, MD

## 2022-09-21 ENCOUNTER — Other Ambulatory Visit: Payer: Self-pay | Admitting: Hematology & Oncology

## 2022-09-21 DIAGNOSIS — I82413 Acute embolism and thrombosis of femoral vein, bilateral: Secondary | ICD-10-CM

## 2022-09-21 DIAGNOSIS — C186 Malignant neoplasm of descending colon: Secondary | ICD-10-CM

## 2022-09-21 DIAGNOSIS — D501 Sideropenic dysphagia: Secondary | ICD-10-CM

## 2022-09-22 ENCOUNTER — Encounter: Payer: Self-pay | Admitting: Hematology & Oncology

## 2022-09-28 ENCOUNTER — Other Ambulatory Visit (INDEPENDENT_AMBULATORY_CARE_PROVIDER_SITE_OTHER): Payer: Self-pay | Admitting: Family Medicine

## 2022-09-28 DIAGNOSIS — R7303 Prediabetes: Secondary | ICD-10-CM

## 2022-09-29 ENCOUNTER — Telehealth (INDEPENDENT_AMBULATORY_CARE_PROVIDER_SITE_OTHER): Payer: Self-pay | Admitting: Family Medicine

## 2022-09-29 NOTE — Telephone Encounter (Signed)
Called the [facility]. Per pharmacy staff the prescription for Metformin is scheduled to be picked up on the 11th of this month.

## 2022-09-29 NOTE — Telephone Encounter (Signed)
Patient called and stated that she is out of Metformin and does not have enough to last her until her appt on 10/12. Patient need a call from Dr. Leafy Ro CMA.

## 2022-09-29 NOTE — Telephone Encounter (Signed)
Called and spoke with patient, per patient the prescription for the Metformin was not at the pharmacy. Rx was sent on 09/01/2022 to [facility].

## 2022-09-29 NOTE — Telephone Encounter (Signed)
Called and spoke with patient, informed her the prescription is at the pharmacy. Told her the pharmacist said it was set to be picked up on the 11th of this month. Patient verbalized understanding.

## 2022-10-02 ENCOUNTER — Ambulatory Visit (INDEPENDENT_AMBULATORY_CARE_PROVIDER_SITE_OTHER): Payer: 59 | Admitting: Family Medicine

## 2022-10-05 ENCOUNTER — Other Ambulatory Visit (INDEPENDENT_AMBULATORY_CARE_PROVIDER_SITE_OTHER): Payer: Self-pay | Admitting: Family Medicine

## 2022-10-05 DIAGNOSIS — R7303 Prediabetes: Secondary | ICD-10-CM

## 2022-10-06 ENCOUNTER — Other Ambulatory Visit (INDEPENDENT_AMBULATORY_CARE_PROVIDER_SITE_OTHER): Payer: Self-pay | Admitting: Family Medicine

## 2022-10-06 DIAGNOSIS — R7303 Prediabetes: Secondary | ICD-10-CM

## 2022-10-06 NOTE — Telephone Encounter (Signed)
LAST APPOINTMENT DATE: 09/08/2022  NEXT APPOINTMENT DATE: 10/09/2022   [facility] [address] [place], [place] - [address] [place] [address] Phone: [phone] Fax: [phone]  Patient is requesting a refill of the following medications: Requested Prescriptions   Pending Prescriptions Disp Refills   metFORMIN (GLUCOPHAGE) 500 MG tablet [Pharmacy Med Name: metFORMIN HCl 500 MG Oral Tablet] 30 tablet 0    Sig: Take 1 tablet by mouth once daily    Date last filled: 09/08/2022 Previously prescribed by [facility]  Lab Results  Component Value Date   HGBA1C 5.9 (H) 06/25/2022   HGBA1C 6.4 (H) 12/18/2021   Lab Results  Component Value Date   LDLCALC 119 (H) 06/25/2022   CREATININE 0.79 08/01/2022   Lab Results  Component Value Date   VD25OH 96.3 06/25/2022   VD25OH 6.7 (L) 12/18/2021    BP Readings from Last 3 Encounters:  09/08/22 123/75  08/07/22 123/78  08/01/22 137/74

## 2022-10-09 ENCOUNTER — Ambulatory Visit (INDEPENDENT_AMBULATORY_CARE_PROVIDER_SITE_OTHER): Payer: 59 | Admitting: Family Medicine

## 2022-10-09 ENCOUNTER — Encounter (INDEPENDENT_AMBULATORY_CARE_PROVIDER_SITE_OTHER): Payer: Self-pay | Admitting: Family Medicine

## 2022-10-09 VITALS — BP 138/85 | HR 97 | Temp 97.8°F | Ht 64.0 in | Wt 182.0 lb

## 2022-10-09 DIAGNOSIS — E669 Obesity, unspecified: Secondary | ICD-10-CM

## 2022-10-09 DIAGNOSIS — R7303 Prediabetes: Secondary | ICD-10-CM

## 2022-10-09 DIAGNOSIS — F3289 Other specified depressive episodes: Secondary | ICD-10-CM | POA: Diagnosis not present

## 2022-10-09 DIAGNOSIS — E559 Vitamin D deficiency, unspecified: Secondary | ICD-10-CM

## 2022-10-09 DIAGNOSIS — Z6831 Body mass index (BMI) 31.0-31.9, adult: Secondary | ICD-10-CM

## 2022-10-09 MED ORDER — VITAMIN D (ERGOCALCIFEROL) 1.25 MG (50000 UNIT) PO CAPS: 50000.0000 [IU] | ORAL_CAPSULE | ORAL | 0 refills | Status: DC

## 2022-10-09 MED ORDER — METFORMIN HCL 500 MG PO TABS: 500.0000 mg | ORAL_TABLET | Freq: Every day | ORAL | 0 refills | Status: DC

## 2022-10-09 MED ORDER — TOPIRAMATE 50 MG PO TABS: 50.0000 mg | ORAL_TABLET | Freq: Every day | ORAL | 0 refills | Status: DC

## 2022-10-09 NOTE — Progress Notes (Signed)
Office: [phone]  /  Fax: [phone]    Total lbs lost to date: 9 Total lbs lost since last in-office visit: 1     BP 138/85   Pulse 97   Temp 97.8 F (36.6 C)   Ht '5\' 4"'$  (1.626 m)   Wt 182 lb (82.6 kg)   SpO2 99%   BMI 31.24 kg/m  She was weighed on the bioimpedance scale:  Body mass index is 31.24 kg/m.  General:  Alert, oriented and cooperative. Patient is in no acute distress.  Mental Status: Normal mood and affect. Normal behavior. Normal judgment and thought content.        Patient past medical history includes:   Past Medical History:  Diagnosis Date   Anemia, iron deficiency 01/27/2012   Back pain    Cancer ([facility]CC)    Clotting disorder ([place])    Colon cancer ([place])    T3N0M0, emergency surgery/with colostomy done-no further tx.   Complication of anesthesia    "hard time getting breath" post op   Constipation    DVT (deep venous thrombosis) (HCC)    Edema of both lower extremities    Fatigue    History of stomach ulcers    Hyperlipemia    Hypertension    Joint pain    Lactose intolerance    Prediabetes    Pulmonary embolism ([facility])    taking injections -last done 1'14(Arixtra)   SOB (shortness of breath)    History of Present Illness The patient presents with concerns about obesity and prediabetes. They report a recent weight loss of one pound but express concerns about their alcohol consumption, which they believe is contributing to their weight issues. The patient admits to drinking every day and has attempted to reduce their intake to 2 or 3 days per week but has been unsuccessful. They are interested in finding alternative ways to feel rewarded that involve fewer calories.  The patient has been taking metformin for prediabetes and reports issues with obtaining refills for the medication. Their last A1c was 5.9, which is an improvement from previous levels. The patient is also taking vitamin D supplements, which have been working well for them. They  have been instructed to take the vitamin D supplement every other week.  The patient also reports a history of feeling alone and experiencing depression, which may be contributing to their alcohol consumption. They live with their small dog and have been struggling with feelings of loneliness. The patient is interested in finding ways to change their routine and reduce their reliance on alcohol as a coping mechanism.  Overall, the patient is focused on addressing their obesity, prediabetes, and alcohol consumption concerns. They are open to exploring alternative ways to feel rewarded and are seeking support in managing their health and emotional well-being.  Assessment & Plan .  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 -Continue to monitor and address emotional health in future visits. -Encourage patient to modify drinking habits and rituals to decrease consumption -Start topiramate 50 mg 1/2 pill for the first week and increase to 1 pill thereafter  Obesity: Patient has lost 1 pound since last visit. -Encourage continued efforts in diet and exercise. -Pescatarian plan  Prediabetes: Last A1C was 5.9, indicating good control. -Continue current management and recheck A1C by the end of the year.  Vitamin D Deficiency: Patient is currently on supplementation and reports adherence. -Refill Vitamin D prescription and continue current  dosing regimen. Recheck levels at next visit.  Follow-up in 4 weeks.  Dennard Nip, MD  I have personally spent 44 minutes total time today in preparation, patient care, and documentation for this visit, including the following: review of clinical lab tests; review of medical tests/procedures/services.

## 2022-10-26 ENCOUNTER — Other Ambulatory Visit (INDEPENDENT_AMBULATORY_CARE_PROVIDER_SITE_OTHER): Payer: Self-pay | Admitting: Family Medicine

## 2022-11-06 ENCOUNTER — Ambulatory Visit (INDEPENDENT_AMBULATORY_CARE_PROVIDER_SITE_OTHER): Payer: 59 | Admitting: Family Medicine

## 2022-11-06 ENCOUNTER — Encounter (INDEPENDENT_AMBULATORY_CARE_PROVIDER_SITE_OTHER): Payer: Self-pay | Admitting: Family Medicine

## 2022-11-06 VITALS — BP 133/76 | HR 87 | Temp 97.6°F | Ht 64.0 in | Wt 180.0 lb

## 2022-11-06 DIAGNOSIS — F3289 Other specified depressive episodes: Secondary | ICD-10-CM

## 2022-11-06 DIAGNOSIS — R7303 Prediabetes: Secondary | ICD-10-CM | POA: Diagnosis not present

## 2022-11-06 DIAGNOSIS — F32A Depression, unspecified: Secondary | ICD-10-CM | POA: Insufficient documentation

## 2022-11-06 DIAGNOSIS — E559 Vitamin D deficiency, unspecified: Secondary | ICD-10-CM

## 2022-11-06 DIAGNOSIS — I10 Essential (primary) hypertension: Secondary | ICD-10-CM | POA: Diagnosis not present

## 2022-11-06 DIAGNOSIS — E669 Obesity, unspecified: Secondary | ICD-10-CM

## 2022-11-06 DIAGNOSIS — Z6831 Body mass index (BMI) 31.0-31.9, adult: Secondary | ICD-10-CM

## 2022-11-06 MED ORDER — TOPIRAMATE 50 MG PO TABS: 50.0000 mg | ORAL_TABLET | Freq: Every day | ORAL | 0 refills | Status: DC

## 2022-11-06 MED ORDER — METFORMIN HCL 500 MG PO TABS: 500.0000 mg | ORAL_TABLET | Freq: Every day | ORAL | 0 refills | Status: DC

## 2022-11-06 MED ORDER — HYDROCHLOROTHIAZIDE 25 MG PO TABS: 25.0000 mg | ORAL_TABLET | Freq: Every day | ORAL | 0 refills | Status: DC

## 2022-11-06 MED ORDER — VITAMIN D (ERGOCALCIFEROL) 1.25 MG (50000 UNIT) PO CAPS: 50000.0000 [IU] | ORAL_CAPSULE | ORAL | 0 refills | Status: DC

## 2022-11-16 ENCOUNTER — Other Ambulatory Visit (INDEPENDENT_AMBULATORY_CARE_PROVIDER_SITE_OTHER): Payer: Self-pay | Admitting: Family Medicine

## 2022-11-16 DIAGNOSIS — E559 Vitamin D deficiency, unspecified: Secondary | ICD-10-CM

## 2022-11-18 ENCOUNTER — Encounter (INDEPENDENT_AMBULATORY_CARE_PROVIDER_SITE_OTHER): Payer: Self-pay | Admitting: Family Medicine

## 2022-11-18 ENCOUNTER — Other Ambulatory Visit (INDEPENDENT_AMBULATORY_CARE_PROVIDER_SITE_OTHER): Payer: Self-pay | Admitting: Family Medicine

## 2022-11-18 DIAGNOSIS — E559 Vitamin D deficiency, unspecified: Secondary | ICD-10-CM

## 2022-11-18 NOTE — Progress Notes (Signed)
Chief Complaint:   OBESITY Mallory Morales is here to discuss her progress with her obesity treatment plan along with follow-up of her obesity related diagnoses. Mallory Morales is on the [employer] and states she is following her eating plan approximately 80% of the time. Mallory Morales states she is doing 0 minutes 0 times per week.  Today's visit was #: 58 Starting weight: 191 lbs Starting date: 12/18/2021 Today's weight: 180 lbs Today's date: 11/06/2022 Total lbs lost to date: 11 Total lbs lost since last in-office visit: 2  Interim History: Mallory Morales has done well with her weight loss, and she is pleased with her progress.  Subjective:   1. Pre-diabetes Mallory Morales is taking metformin with no side effects noted.  She is working on her healthy eating plan.  2. Vitamin D deficiency Mallory Morales is taking vitamin D prescription with no side effects noted.  3. Essential hypertention Mallory Morales is taking hydrochlorothiazide with no side effects noted.  4. Other depression with emotional eating Mallory Morales is taking Topamax, and she is having vivid bad dreams, but she feels the Topamax is really helping to decrease cravings and decrease his desires for EtOH.  Assessment/Plan:   1. Pre-diabetes Mallory Morales will continue metformin, and she will continue with her diet and exercise.  We will refill metformin for 1 month.  - metFORMIN (GLUCOPHAGE) 500 MG tablet; Take 1 tablet (500 mg total) by mouth daily.  Dispense: 30 tablet; Refill: 0  2. Vitamin D deficiency Mallory Morales will continue prescription vitamin D 50,000 units every 14 days, and we will refill for 90 days.  - Vitamin D, Ergocalciferol, (DRISDOL) 1.25 MG (50000 UNIT) CAPS capsule; Take 1 capsule (50,000 Units total) by mouth every 14 (fourteen) days.  Dispense: 7 capsule; Refill: 0  3. Essential hypertention Mallory Morales will continue hydrochlorothiazide 25 mg once daily, and we will refill for 1 month.  - hydrochlorothiazide (HYDRODIURIL) 25 MG tablet;  Take 1 tablet (25 mg total) by mouth daily. Takes 1 tab in the morning  Dispense: 30 tablet; Refill: 0  4. Other depression with emotional eating Mallory Morales will continue Topamax, and will continue with her diet and exercise.  We will refill Topamax for 1 month.  - topiramate (TOPAMAX) 50 MG tablet; Take 1 tablet (50 mg total) by mouth daily.  Dispense: 30 tablet; Refill: 0  5. Obesity, Current BMI 31.0 Mallory Morales is currently in the action stage of change. As such, her goal is to continue with weight loss efforts. She has agreed to the [employer].   We will recheck fasting labs at her next visit.   Exercise goals: All adults should avoid inactivity. Some physical activity is better than none, and adults who participate in any amount of physical activity gain some health benefits.  Behavioral modification strategies: increasing lean protein intake and holiday eating strategies .  Mallory Morales has agreed to follow-up with our clinic in 3 weeks. She was informed of the importance of frequent follow-up visits to maximize her success with intensive lifestyle modifications for her multiple health conditions.   Objective:   Blood pressure 133/76, pulse 87, temperature 97.6 F (36.4 C), height '5\' 4"'$  (1.626 m), weight 180 lb (81.6 kg), SpO2 99 %. Body mass index is 30.9 kg/m.  General: Cooperative, alert, well developed, in no acute distress. HEENT: Conjunctivae and lids unremarkable. Cardiovascular: Regular rhythm.  Lungs: Normal work of breathing. Neurologic: No focal deficits.   Lab Results  Component Value Date   CREATININE 0.79 08/01/2022   BUN 15 08/01/2022  NA 141 08/01/2022   K 4.0 08/01/2022   CL 103 08/01/2022   CO2 30 08/01/2022   Lab Results  Component Value Date   ALT 16 08/01/2022   AST 17 08/01/2022   ALKPHOS 68 08/01/2022   BILITOT 0.8 08/01/2022   Lab Results  Component Value Date   HGBA1C 5.9 (H) 06/25/2022   HGBA1C 6.4 (H) 12/18/2021   Lab Results   Component Value Date   INSULIN 9.7 06/25/2022   INSULIN 18.3 12/18/2021   Lab Results  Component Value Date   TSH 1.270 12/18/2021   Lab Results  Component Value Date   CHOL 220 (H) 06/25/2022   HDL 78 06/25/2022   LDLCALC 119 (H) 06/25/2022   TRIG 132 06/25/2022   CHOLHDL 3.1 12/18/2021   Lab Results  Component Value Date   VD25OH 96.3 06/25/2022   VD25OH 6.7 (L) 12/18/2021   Lab Results  Component Value Date   WBC 4.2 08/01/2022   HGB 13.2 08/01/2022   HCT 38.1 08/01/2022   MCV 89.2 08/01/2022   PLT 143 (L) 08/01/2022   Lab Results  Component Value Date   IRON 104 08/01/2022   TIBC 386 08/01/2022   FERRITIN 148 08/01/2022   Attestation Statements:   Reviewed by clinician on day of visit: allergies, medications, problem list, medical history, surgical history, family history, social history, and previous encounter notes.   I, Trixie Dredge, am acting as transcriptionist for Dennard Nip, MD.  I have reviewed the above documentation for accuracy and completeness, and I agree with the above. -  Dennard Nip, MD

## 2022-11-27 ENCOUNTER — Encounter (INDEPENDENT_AMBULATORY_CARE_PROVIDER_SITE_OTHER): Payer: Self-pay

## 2022-11-27 ENCOUNTER — Encounter (INDEPENDENT_AMBULATORY_CARE_PROVIDER_SITE_OTHER): Payer: Self-pay | Admitting: Family Medicine

## 2022-11-27 ENCOUNTER — Ambulatory Visit (INDEPENDENT_AMBULATORY_CARE_PROVIDER_SITE_OTHER): Payer: 59 | Admitting: Family Medicine

## 2022-11-27 VITALS — BP 123/72 | HR 73 | Temp 97.6°F | Ht 64.0 in | Wt 181.0 lb

## 2022-11-27 DIAGNOSIS — F3289 Other specified depressive episodes: Secondary | ICD-10-CM

## 2022-11-27 DIAGNOSIS — E7849 Other hyperlipidemia: Secondary | ICD-10-CM

## 2022-11-27 DIAGNOSIS — E559 Vitamin D deficiency, unspecified: Secondary | ICD-10-CM

## 2022-11-27 DIAGNOSIS — E7800 Pure hypercholesterolemia, unspecified: Secondary | ICD-10-CM

## 2022-11-27 DIAGNOSIS — I10 Essential (primary) hypertension: Secondary | ICD-10-CM | POA: Diagnosis not present

## 2022-11-27 DIAGNOSIS — E669 Obesity, unspecified: Secondary | ICD-10-CM

## 2022-11-27 DIAGNOSIS — R7303 Prediabetes: Secondary | ICD-10-CM | POA: Diagnosis not present

## 2022-11-27 DIAGNOSIS — E66811 Obesity, class 1: Secondary | ICD-10-CM

## 2022-11-27 MED ORDER — HYDROCHLOROTHIAZIDE 25 MG PO TABS: 25.0000 mg | ORAL_TABLET | Freq: Every day | ORAL | 0 refills | Status: DC

## 2022-11-27 MED ORDER — VITAMIN D (ERGOCALCIFEROL) 1.25 MG (50000 UNIT) PO CAPS: 50000.0000 [IU] | ORAL_CAPSULE | ORAL | 0 refills | Status: DC

## 2022-11-27 MED ORDER — TOPIRAMATE 50 MG PO TABS: 50.0000 mg | ORAL_TABLET | Freq: Every day | ORAL | 0 refills | Status: DC

## 2022-11-27 MED ORDER — METFORMIN HCL 500 MG PO TABS: 500.0000 mg | ORAL_TABLET | Freq: Every day | ORAL | 0 refills | Status: DC

## 2022-11-28 LAB — CMP14+EGFR
ALT: 16 IU/L (ref 0–32)
AST: 19 IU/L (ref 0–40)
Albumin/Globulin Ratio: 2.4 — ABNORMAL HIGH (ref 1.2–2.2)
Albumin: 4.8 g/dL (ref 3.9–4.9)
Alkaline Phosphatase: 75 IU/L (ref 44–121)
BUN/Creatinine Ratio: 18 (ref 12–28)
BUN: 13 mg/dL (ref 8–27)
Bilirubin Total: 0.8 mg/dL (ref 0.0–1.2)
CO2: 26 mmol/L (ref 20–29)
Calcium: 9.5 mg/dL (ref 8.7–10.3)
Chloride: 102 mmol/L (ref 96–106)
Creatinine, Ser: 0.74 mg/dL (ref 0.57–1.00)
Globulin, Total: 2 g/dL (ref 1.5–4.5)
Glucose: 120 mg/dL — ABNORMAL HIGH (ref 70–99)
Potassium: 4.2 mmol/L (ref 3.5–5.2)
Sodium: 143 mmol/L (ref 134–144)
Total Protein: 6.8 g/dL (ref 6.0–8.5)
eGFR: 91 mL/min/{1.73_m2} (ref >59)

## 2022-11-28 LAB — LIPID PANEL WITH LDL/HDL RATIO
Cholesterol, Total: 234 mg/dL — ABNORMAL HIGH (ref 100–199)
HDL: 83 mg/dL (ref >39)
LDL Chol Calc (NIH): 133 mg/dL — ABNORMAL HIGH (ref 0–99)
LDL/HDL Ratio: 1.6 ratio (ref 0.0–3.2)
Triglycerides: 107 mg/dL (ref 0–149)
VLDL Cholesterol Cal: 18 mg/dL (ref 5–40)

## 2022-11-28 LAB — HEMOGLOBIN A1C
Est. average glucose Bld gHb Est-mCnc: 123 mg/dL
Hgb A1c MFr Bld: 5.9 % — ABNORMAL HIGH (ref 4.8–5.6)

## 2022-11-28 LAB — INSULIN, RANDOM: INSULIN: 10.8 u[IU]/mL (ref 2.6–24.9)

## 2022-11-28 LAB — VITAMIN B12: Vitamin B-12: 1111 pg/mL (ref 232–1245)

## 2022-11-28 LAB — VITAMIN D 25 HYDROXY (VIT D DEFICIENCY, FRACTURES): Vit D, 25-Hydroxy: 47.7 ng/mL (ref 30.0–100.0)

## 2022-12-08 NOTE — Progress Notes (Unsigned)
Chief Complaint:   OBESITY Mallory Morales is here to discuss her progress with her obesity treatment plan along with follow-up of her obesity related diagnoses. Mallory Morales is on the [employer] and states she is following her eating plan approximately 30% of the time. Mallory Morales states she is doing 0 minutes 0 times per week.  Today's visit was #: 37 Starting weight: 191 lbs Starting date: 12/18/2021 Today's weight: 181 lbs Today's date: 11/27/2022 Total lbs lost to date: 10 Total lbs lost since last in-office visit: 0  Interim History: Martavia did very well with minimizing thanksgiving weight gain.  She is making good strategies for Christmas and her birthday.  Subjective:   1. Pre-diabetes Mallory Morales is doing well with her diet and weight loss.  She has no problems with metformin.  2. Essential hypertention Mallory Morales's blood pressure is stable on her medications, and she is due for labs.  She has no signs of hypotension.  3. Other hyperlipidemia Mallory Morales is on [place] and she is working on her diet.  She is due to have her labs done.  4. Vitamin D deficiency Mallory Morales is on vitamin D, and she is due for labs.  She denies nausea, vomiting, or muscle weakness.  5. Other depression with emotional eating Mallory Morales has some mild fingertip tingling on Topamax, but she is doing well otherwise.  She is not sure how long she wants to take this medication.  She is doing well with decreasing emotional eating behaviors.  Assessment/Plan:   1. Pre-diabetes We will check labs today, and we will refill metformin for 1 month. Mallory Morales will continue to work on weight loss, exercise, and decreasing simple carbohydrates to help decrease the risk of diabetes.   - metFORMIN (GLUCOPHAGE) 500 MG tablet; Take 1 tablet (500 mg total) by mouth daily.  Dispense: 30 tablet; Refill: 0 - CMP14+EGFR - Vitamin B12 - Hemoglobin A1c - Insulin, random  2. Essential hypertention We will check labs today.  Mallory Morales will  continue hydrochlorothiazide 25 mg once daily, and we will refill for 1 month.  - hydrochlorothiazide (HYDRODIURIL) 25 MG tablet; Take 1 tablet (25 mg total) by mouth daily. Takes 1 tab in the morning  Dispense: 30 tablet; Refill: 0  3. Other hyperlipidemia We will check labs today.  Mallory Morales will continue with her diet, exercise, and weight loss efforts.  - Lipid Panel With LDL/HDL Ratio  4. Vitamin D deficiency We will check labs today, and we will refill prescription vitamin D 50,000 IU every 14 days for 90 days.  - Vitamin D, Ergocalciferol, (DRISDOL) 1.25 MG (50000 UNIT) CAPS capsule; Take 1 capsule (50,000 Units total) by mouth every 14 (fourteen) days.  Dispense: 7 capsule; Refill: 0 - VITAMIN D 25 Hydroxy (Vit-D Deficiency, Fractures)  5. Other depression with emotional eating Mallory Morales will continue Topamax 50 mg once daily, and we will refill for 1 month.  She is okay to start when she no longer needs the medication.  - topiramate (TOPAMAX) 50 MG tablet; Take 1 tablet (50 mg total) by mouth daily.  Dispense: 30 tablet; Refill: 0  6. Obesity, Current BMI 31.0 Mallory Morales is currently in the action stage of change. As such, her goal is to continue with weight loss efforts. She has agreed to the [employer].   Behavioral modification strategies: increasing lean protein intake and holiday eating strategies .  Mallory Morales has agreed to follow-up with our clinic in 2 to 3 weeks. She was informed of the importance of frequent follow-up  visits to maximize her success with intensive lifestyle modifications for her multiple health conditions.   Mallory Morales was informed we would discuss her lab results at her next visit unless there is a critical issue that needs to be addressed sooner. Mallory Morales agreed to keep her next visit at the agreed upon time to discuss these results.  Objective:   Blood pressure 123/72, pulse 73, temperature 97.6 F (36.4 C), height 5' 4" (1.626 m), weight 181 lb (82.1  kg), SpO2 99 %. Body mass index is 31.07 kg/m.  General: Cooperative, alert, well developed, in no acute distress. HEENT: Conjunctivae and lids unremarkable. Cardiovascular: Regular rhythm.  Lungs: Normal work of breathing. Neurologic: No focal deficits.   Lab Results  Component Value Date   CREATININE 0.74 11/27/2022   BUN 13 11/27/2022   NA 143 11/27/2022   K 4.2 11/27/2022   CL 102 11/27/2022   CO2 26 11/27/2022   Lab Results  Component Value Date   ALT 16 11/27/2022   AST 19 11/27/2022   ALKPHOS 75 11/27/2022   BILITOT 0.8 11/27/2022   Lab Results  Component Value Date   HGBA1C 5.9 (H) 11/27/2022   HGBA1C 5.9 (H) 06/25/2022   HGBA1C 6.4 (H) 12/18/2021   Lab Results  Component Value Date   INSULIN 10.8 11/27/2022   INSULIN 9.7 06/25/2022   INSULIN 18.3 12/18/2021   Lab Results  Component Value Date   TSH 1.270 12/18/2021   Lab Results  Component Value Date   CHOL 234 (H) 11/27/2022   HDL 83 11/27/2022   LDLCALC 133 (H) 11/27/2022   TRIG 107 11/27/2022   CHOLHDL 3.1 12/18/2021   Lab Results  Component Value Date   VD25OH 47.7 11/27/2022   VD25OH 96.3 06/25/2022   VD25OH 6.7 (L) 12/18/2021   Lab Results  Component Value Date   WBC 4.2 08/01/2022   HGB 13.2 08/01/2022   HCT 38.1 08/01/2022   MCV 89.2 08/01/2022   PLT 143 (L) 08/01/2022   Lab Results  Component Value Date   IRON 104 08/01/2022   TIBC 386 08/01/2022   FERRITIN 148 08/01/2022   Attestation Statements:   Reviewed by clinician on day of visit: allergies, medications, problem list, medical history, surgical history, family history, social history, and previous encounter notes.   I, Trixie Dredge, am acting as transcriptionist for Dennard Nip, MD.  I have reviewed the above documentation for accuracy and completeness, and I agree with the above. -  Dennard Nip, MD

## 2022-12-10 ENCOUNTER — Encounter (INDEPENDENT_AMBULATORY_CARE_PROVIDER_SITE_OTHER): Payer: Self-pay | Admitting: Family Medicine

## 2022-12-10 ENCOUNTER — Ambulatory Visit (INDEPENDENT_AMBULATORY_CARE_PROVIDER_SITE_OTHER): Payer: 59 | Admitting: Family Medicine

## 2022-12-10 VITALS — BP 129/73 | HR 85 | Temp 98.3°F | Ht 64.0 in | Wt 183.0 lb

## 2022-12-10 DIAGNOSIS — E7849 Other hyperlipidemia: Secondary | ICD-10-CM | POA: Insufficient documentation

## 2022-12-10 DIAGNOSIS — E669 Obesity, unspecified: Secondary | ICD-10-CM | POA: Diagnosis not present

## 2022-12-10 DIAGNOSIS — E559 Vitamin D deficiency, unspecified: Secondary | ICD-10-CM

## 2022-12-10 DIAGNOSIS — R7303 Prediabetes: Secondary | ICD-10-CM

## 2022-12-10 DIAGNOSIS — Z6831 Body mass index (BMI) 31.0-31.9, adult: Secondary | ICD-10-CM

## 2022-12-27 ENCOUNTER — Other Ambulatory Visit: Payer: Self-pay | Admitting: Hematology & Oncology

## 2022-12-27 DIAGNOSIS — I82413 Acute embolism and thrombosis of femoral vein, bilateral: Secondary | ICD-10-CM

## 2022-12-27 DIAGNOSIS — C186 Malignant neoplasm of descending colon: Secondary | ICD-10-CM

## 2022-12-27 DIAGNOSIS — D501 Sideropenic dysphagia: Secondary | ICD-10-CM

## 2022-12-30 NOTE — Progress Notes (Unsigned)
Chief Complaint:   OBESITY Mallory Morales is here to discuss her progress with her obesity treatment plan along with follow-up of her obesity related diagnoses. Mallory Morales is on the [employer] and states she is following her eating plan approximately 20% of the time. Mallory Morales states she is doing 0 minutes 0 times per week.  Today's visit was #: 18 Starting weight: 191 lbs Starting date: 12/18/2021 Today's weight: 183 lbs Today's date: 12/10/2022 Total lbs lost to date: 8 Total lbs lost since last in-office visit: 0  Interim History: Marcie has been mindful of her portions and she is trying to increase her protein. Her goal is to maintain her weight over her birthday and Christmas.   Subjective:   1. Prediabetes Mallory Morales is stable on metformin, and she continues to work on her diet and weight loss. No side effects were noted. I discussed labs with the patient today.   2. Other hyperlipidemia Mallory Morales's LDL is worsening, and has increased. She is on fluvastatin 80 mg and she has a history of severe rhabdomyolysis on simvastatin. I discussed labs with the patient today.   3. Vitamin D deficiency Mallory Morales is stable on Vitamin D every other week. Her last level was higher than expected. I discussed labs with the patient today.   Assessment/Plan:   1. Prediabetes Mallory Morales will continue metformin 500 mg once daily #30, and we will refill for 1 month.   2. Other hyperlipidemia Ersie is to continue to work on decreasing high cholesterol foods.  She was educated on what foods to minimize.  3. Vitamin D deficiency Mallory Morales will continue vitamin D prescription every other week, and we will continue to monitor.  4. Obesity, Current BMI 31.4 Mallory Morales is currently in the action stage of change. As such, her goal is to continue with weight loss efforts. She has agreed to the [employer].   Exercise goals: Wall pilates.   Behavioral modification strategies: increasing lean protein  intake, holiday eating strategies , and celebration eating strategies.  Mallory Morales has agreed to follow-up with our clinic in 4 weeks. She was informed of the importance of frequent follow-up visits to maximize her success with intensive lifestyle modifications for her multiple health conditions.   Objective:   Blood pressure 129/73, pulse 85, temperature 98.3 F (36.8 C), height '5\' 4"'$  (1.626 m), weight 183 lb (83 kg), SpO2 97 %. Body mass index is 31.41 kg/m.  General: Cooperative, alert, well developed, in no acute distress. HEENT: Conjunctivae and lids unremarkable. Cardiovascular: Regular rhythm.  Lungs: Normal work of breathing. Neurologic: No focal deficits.   Lab Results  Component Value Date   CREATININE 0.74 11/27/2022   BUN 13 11/27/2022   NA 143 11/27/2022   K 4.2 11/27/2022   CL 102 11/27/2022   CO2 26 11/27/2022   Lab Results  Component Value Date   ALT 16 11/27/2022   AST 19 11/27/2022   ALKPHOS 75 11/27/2022   BILITOT 0.8 11/27/2022   Lab Results  Component Value Date   HGBA1C 5.9 (H) 11/27/2022   HGBA1C 5.9 (H) 06/25/2022   HGBA1C 6.4 (H) 12/18/2021   Lab Results  Component Value Date   INSULIN 10.8 11/27/2022   INSULIN 9.7 06/25/2022   INSULIN 18.3 12/18/2021   Lab Results  Component Value Date   TSH 1.270 12/18/2021   Lab Results  Component Value Date   CHOL 234 (H) 11/27/2022   HDL 83 11/27/2022   LDLCALC 133 (H) 11/27/2022   TRIG 107  11/27/2022   CHOLHDL 3.1 12/18/2021   Lab Results  Component Value Date   VD25OH 47.7 11/27/2022   VD25OH 96.3 06/25/2022   VD25OH 6.7 (L) 12/18/2021   Lab Results  Component Value Date   WBC 4.2 08/01/2022   HGB 13.2 08/01/2022   HCT 38.1 08/01/2022   MCV 89.2 08/01/2022   PLT 143 (L) 08/01/2022   Lab Results  Component Value Date   IRON 104 08/01/2022   TIBC 386 08/01/2022   FERRITIN 148 08/01/2022   Attestation Statements:   Reviewed by clinician on day of visit: allergies, medications,  problem list, medical history, surgical history, family history, social history, and previous encounter notes.  I have personally spent 40 minutes total time today in preparation, patient care, and documentation for this visit, including the following: review of clinical lab tests; review of medical tests/procedures/services.   I, Trixie Dredge, am acting as transcriptionist for Dennard Nip, MD.  I have reviewed the above documentation for accuracy and completeness, and I agree with the above. -  Dennard Nip, MD

## 2022-12-31 MED ORDER — METFORMIN HCL 500 MG PO TABS: 500.0000 mg | ORAL_TABLET | Freq: Every day | ORAL | 0 refills | Status: DC

## 2023-01-01 ENCOUNTER — Encounter (INDEPENDENT_AMBULATORY_CARE_PROVIDER_SITE_OTHER): Payer: Self-pay | Admitting: Family Medicine

## 2023-01-01 ENCOUNTER — Ambulatory Visit (INDEPENDENT_AMBULATORY_CARE_PROVIDER_SITE_OTHER): Payer: 59 | Admitting: Family Medicine

## 2023-01-01 VITALS — BP 143/84 | HR 99 | Temp 98.6°F | Ht 64.0 in | Wt 182.0 lb

## 2023-01-01 DIAGNOSIS — F3289 Other specified depressive episodes: Secondary | ICD-10-CM | POA: Diagnosis not present

## 2023-01-01 DIAGNOSIS — E559 Vitamin D deficiency, unspecified: Secondary | ICD-10-CM | POA: Diagnosis not present

## 2023-01-01 DIAGNOSIS — I10 Essential (primary) hypertension: Secondary | ICD-10-CM

## 2023-01-01 DIAGNOSIS — E66811 Obesity, class 1: Secondary | ICD-10-CM

## 2023-01-01 DIAGNOSIS — Z6831 Body mass index (BMI) 31.0-31.9, adult: Secondary | ICD-10-CM

## 2023-01-01 DIAGNOSIS — R7303 Prediabetes: Secondary | ICD-10-CM

## 2023-01-01 DIAGNOSIS — E669 Obesity, unspecified: Secondary | ICD-10-CM

## 2023-01-01 MED ORDER — VITAMIN D (ERGOCALCIFEROL) 1.25 MG (50000 UNIT) PO CAPS: 50000.0000 [IU] | ORAL_CAPSULE | ORAL | 0 refills | Status: DC

## 2023-01-01 MED ORDER — HYDROCHLOROTHIAZIDE 25 MG PO TABS: 25.0000 mg | ORAL_TABLET | Freq: Every day | ORAL | 0 refills | Status: DC

## 2023-01-01 MED ORDER — TOPIRAMATE 50 MG PO TABS: 50.0000 mg | ORAL_TABLET | Freq: Every day | ORAL | 0 refills | Status: DC

## 2023-01-01 MED ORDER — METFORMIN HCL 500 MG PO TABS: 500.0000 mg | ORAL_TABLET | Freq: Every day | ORAL | 0 refills | Status: DC

## 2023-01-12 NOTE — Progress Notes (Unsigned)
Chief Complaint:   OBESITY Mallory Morales is here to discuss her progress with her obesity treatment plan along with follow-up of her obesity related diagnoses. Mallory Morales is on the [employer] and states she is following her eating plan approximately 60% of the time. Mallory Morales states she is doing 0 minutes 0 times per week.  Today's visit was #: 36 Starting weight: 191 lbs Starting date: 12/18/2021 Today's weight: 182 lbs Today's date: 01/01/2023 Total lbs lost to date: 9 Total lbs lost since last in-office visit: 1  Interim History: Mallory Morales did well with avoiding holiday weight gain. She is working on getting back on track with her [place], and her hunger is mostly controlled.   Subjective:   1. Essential hypertention Mallory Morales's blood pressure is mildly elevated today, but normally well-controlled.  She is doing well with her diet and medications, and her GFR is within normal limits.  I discussed labs with the patient today.  2. Vitamin D deficiency Mallory Morales is stable on vitamin D, and she requests a refill today.  Her last vitamin D level was at goal.  I discussed labs with the patient today.  3. Prediabetes Mallory Morales is working on her diet and she is doing well on metformin.  She was very close to diabetes mellitus when she started and her A1c has improved to 5.9.  No GI upset was noted.  4. Emotional Eating Behavior Mallory Morales is working on decreasing emotional eating behaviors.  She is taking Topamax intermittently but is likely not getting much benefit with taking it this way.  Assessment/Plan:   1. Essential hypertention Mallory Morales will continue hydrochlorothiazide, and we will refill for 1 month.  We will recheck her blood pressure at her next visit in 1 month.  - hydrochlorothiazide (HYDRODIURIL) 25 MG tablet; Take 1 tablet (25 mg total) by mouth daily. Takes 1 tab in the morning  Dispense: 30 tablet; Refill: 0  2. Vitamin D deficiency Mallory Morales will continue prescription  vitamin D, and we will refill for 1 month.  - Vitamin D, Ergocalciferol, (DRISDOL) 1.25 MG (50000 UNIT) CAPS capsule; Take 1 capsule (50,000 Units total) by mouth every 14 (fourteen) days.  Dispense: 2 capsule; Refill: 0  3. Prediabetes Mallory Morales will continue with her diet and metformin, and we will refill metformin for 1 month.  - metFORMIN (GLUCOPHAGE) 500 MG tablet; Take 1 tablet (500 mg total) by mouth daily.  Dispense: 30 tablet; Refill: 0  4. Emotional Eating Behavior Mallory Morales will continue Topamax, and we will refill for 1 month.  She is to take the whole pill every night, and we will follow-up at her next visit in 1 month to reassess her progress.  - topiramate (TOPAMAX) 50 MG tablet; Take 1 tablet (50 mg total) by mouth daily.  Dispense: 30 tablet; Refill: 0  5. Obesity, Current BMI 31.3 Mallory Morales is currently in the action stage of change. As such, her goal is to continue with weight loss efforts. She has agreed to the [employer].   Behavioral modification strategies: decreasing simple carbohydrates, no skipping meals, and planning for success.  Mallory Morales has agreed to follow-up with our clinic in 4 weeks. She was informed of the importance of frequent follow-up visits to maximize her success with intensive lifestyle modifications for her multiple health conditions.   Objective:   Blood pressure (!) 143/84, pulse 99, temperature 98.6 F (37 C), height '5\' 4"'$  (1.626 m), weight 182 lb (82.6 kg), SpO2 99 %. Body mass index is 31.24 kg/m.  General: Cooperative, alert, well developed, in no acute distress. HEENT: Conjunctivae and lids unremarkable. Cardiovascular: Regular rhythm.  Lungs: Normal work of breathing. Neurologic: No focal deficits.   Lab Results  Component Value Date   CREATININE 0.74 11/27/2022   BUN 13 11/27/2022   NA 143 11/27/2022   K 4.2 11/27/2022   CL 102 11/27/2022   CO2 26 11/27/2022   Lab Results  Component Value Date   ALT 16 11/27/2022   AST  19 11/27/2022   ALKPHOS 75 11/27/2022   BILITOT 0.8 11/27/2022   Lab Results  Component Value Date   HGBA1C 5.9 (H) 11/27/2022   HGBA1C 5.9 (H) 06/25/2022   HGBA1C 6.4 (H) 12/18/2021   Lab Results  Component Value Date   INSULIN 10.8 11/27/2022   INSULIN 9.7 06/25/2022   INSULIN 18.3 12/18/2021   Lab Results  Component Value Date   TSH 1.270 12/18/2021   Lab Results  Component Value Date   CHOL 234 (H) 11/27/2022   HDL 83 11/27/2022   LDLCALC 133 (H) 11/27/2022   TRIG 107 11/27/2022   CHOLHDL 3.1 12/18/2021   Lab Results  Component Value Date   VD25OH 47.7 11/27/2022   VD25OH 96.3 06/25/2022   VD25OH 6.7 (L) 12/18/2021   Lab Results  Component Value Date   WBC 4.2 08/01/2022   HGB 13.2 08/01/2022   HCT 38.1 08/01/2022   MCV 89.2 08/01/2022   PLT 143 (L) 08/01/2022   Lab Results  Component Value Date   IRON 104 08/01/2022   TIBC 386 08/01/2022   FERRITIN 148 08/01/2022   Attestation Statements:   Reviewed by clinician on day of visit: allergies, medications, problem list, medical history, surgical history, family history, social history, and previous encounter notes.   I, Trixie Dredge, am acting as transcriptionist for Dennard Nip, MD.  I have reviewed the above documentation for accuracy and completeness, and I agree with the above. -  Dennard Nip, MD

## 2023-01-22 ENCOUNTER — Ambulatory Visit (INDEPENDENT_AMBULATORY_CARE_PROVIDER_SITE_OTHER): Payer: 59 | Admitting: Family Medicine

## 2023-02-02 ENCOUNTER — Other Ambulatory Visit: Payer: 59

## 2023-02-02 ENCOUNTER — Ambulatory Visit: Payer: 59 | Admitting: Family

## 2023-02-03 ENCOUNTER — Inpatient Hospital Stay (HOSPITAL_BASED_OUTPATIENT_CLINIC_OR_DEPARTMENT_OTHER): Payer: 59 | Admitting: Family

## 2023-02-03 ENCOUNTER — Inpatient Hospital Stay: Payer: 59 | Attending: Hematology & Oncology

## 2023-02-03 ENCOUNTER — Encounter: Payer: Self-pay | Admitting: Family

## 2023-02-03 VITALS — BP 138/85 | HR 88 | Temp 98.2°F | Resp 17 | Wt 189.0 lb

## 2023-02-03 DIAGNOSIS — I82411 Acute embolism and thrombosis of right femoral vein: Secondary | ICD-10-CM | POA: Diagnosis not present

## 2023-02-03 DIAGNOSIS — C186 Malignant neoplasm of descending colon: Secondary | ICD-10-CM

## 2023-02-03 DIAGNOSIS — Z86718 Personal history of other venous thrombosis and embolism: Secondary | ICD-10-CM | POA: Insufficient documentation

## 2023-02-03 DIAGNOSIS — E611 Iron deficiency: Secondary | ICD-10-CM | POA: Diagnosis not present

## 2023-02-03 DIAGNOSIS — D500 Iron deficiency anemia secondary to blood loss (chronic): Secondary | ICD-10-CM | POA: Diagnosis not present

## 2023-02-03 DIAGNOSIS — Z7901 Long term (current) use of anticoagulants: Secondary | ICD-10-CM | POA: Diagnosis not present

## 2023-02-03 DIAGNOSIS — Z85038 Personal history of other malignant neoplasm of large intestine: Secondary | ICD-10-CM | POA: Diagnosis present

## 2023-02-03 LAB — CMP (CANCER CENTER ONLY)
ALT: 13 U/L (ref 0–44)
AST: 14 U/L — ABNORMAL LOW (ref 15–41)
Albumin: 4.6 g/dL (ref 3.5–5.0)
Alkaline Phosphatase: 71 U/L (ref 38–126)
Anion gap: 10 (ref 5–15)
BUN: 11 mg/dL (ref 8–23)
CO2: 29 mmol/L (ref 22–32)
Calcium: 9.5 mg/dL (ref 8.9–10.3)
Chloride: 103 mmol/L (ref 98–111)
Creatinine: 0.8 mg/dL (ref 0.44–1.00)
GFR, Estimated: >60 mL/min (ref >60)
Glucose, Bld: 129 mg/dL — ABNORMAL HIGH (ref 70–99)
Potassium: 4 mmol/L (ref 3.5–5.1)
Sodium: 142 mmol/L (ref 135–145)
Total Bilirubin: 0.7 mg/dL (ref 0.3–1.2)
Total Protein: 7.3 g/dL (ref 6.5–8.1)

## 2023-02-03 LAB — CBC WITH DIFFERENTIAL (CANCER CENTER ONLY)
Abs Immature Granulocytes: 0.02 10*3/uL (ref 0.00–0.07)
Basophils Absolute: 0 10*3/uL (ref 0.0–0.1)
Basophils Relative: 1 %
Eosinophils Absolute: 0.2 10*3/uL (ref 0.0–0.5)
Eosinophils Relative: 3 %
HCT: 38.7 % (ref 36.0–46.0)
Hemoglobin: 13.6 g/dL (ref 12.0–15.0)
Immature Granulocytes: 0 %
Lymphocytes Relative: 28 %
Lymphs Abs: 1.5 10*3/uL (ref 0.7–4.0)
MCH: 31.3 pg (ref 26.0–34.0)
MCHC: 35.1 g/dL (ref 30.0–36.0)
MCV: 89 fL (ref 80.0–100.0)
Monocytes Absolute: 0.4 10*3/uL (ref 0.1–1.0)
Monocytes Relative: 8 %
Neutro Abs: 3.4 10*3/uL (ref 1.7–7.7)
Neutrophils Relative %: 60 %
Platelet Count: 160 10*3/uL (ref 150–400)
RBC: 4.35 MIL/uL (ref 3.87–5.11)
RDW: 12.2 % (ref 11.5–15.5)
WBC Count: 5.6 10*3/uL (ref 4.0–10.5)
nRBC: 0 % (ref 0.0–0.2)

## 2023-02-03 LAB — CEA (IN HOUSE-CHCC): CEA (CHCC-In House): 5.52 ng/mL — ABNORMAL HIGH (ref 0.00–5.00)

## 2023-02-03 LAB — LACTATE DEHYDROGENASE: LDH: 177 U/L (ref 98–192)

## 2023-02-03 LAB — IRON AND IRON BINDING CAPACITY (CC-WL,HP ONLY)
Iron: 66 ug/dL (ref 28–170)
Saturation Ratios: 17 % (ref 10.4–31.8)
TIBC: 388 ug/dL (ref 250–450)
UIBC: 322 ug/dL (ref 148–442)

## 2023-02-03 LAB — FERRITIN: Ferritin: 127 ng/mL (ref 11–307)

## 2023-02-03 NOTE — Progress Notes (Signed)
Hematology and Oncology Follow Up Visit  Mallory Morales [id number] [birth year] 67 y.o. 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 She is doing well but having a lot of joint aches and pains. She states that she has had an issue with this in the past with her statin. She plans to follow-up with her PCP later this month.  She is ding well on Eliquis. No issue with bleeding, bruising or petechiae. SO far there has been no new recurrence.   No fever, chills, n/v, cough, rash, dizziness, SOB, chest pain, palpitations, abdominal pain or changes in bowel or bladder habits.  No swelling, tenderness or tingling in her extremities at this time.  The numbness in her right thigh at night is unchanged from baseline.  No falls or syncope reported.  Appetite and hydration are good. Weight is stable at 189 lbs.   ECOG Performance Status: 1 - Symptomatic but completely ambulatory  Medications:  Allergies as of 02/03/2023       Reactions   Lisinopril Swelling   Lip swelling   Simvastatin Other (See Comments)   Severe rhabdomyolysis.   Wellbutrin [bupropion] Swelling   Facial Swelling        Medication List        Accurate as of February 03, 2023  8:37 AM. If you have any questions, ask your nurse or doctor.          amLODipine 2.5 MG tablet Commonly known as: NORVASC Take 2.5 mg by mouth daily.   fluvastatin 40 MG capsule Commonly known as: LESCOL Take 40 mg by mouth at bedtime. Take 2 capsules by mouth daily   hydrochlorothiazide 25 MG tablet Commonly known as: HYDRODIURIL Take 1 tablet (25 mg total) by mouth daily. Takes 1 tab in the  morning   metFORMIN 500 MG tablet Commonly known as: GLUCOPHAGE Take 1 tablet (500 mg total) by mouth daily.   topiramate 50 MG tablet Commonly known as: Topamax Take 1 tablet (50 mg total) by mouth daily.   Vitamin D (Ergocalciferol) 1.25 MG (50000 UNIT) Caps capsule Commonly known as: DRISDOL Take 1 capsule (50,000 Units total) by mouth every 14 (fourteen) days.   Xarelto 2.5 MG Tabs tablet Generic drug: rivaroxaban Take 2 tablets by mouth once daily        Allergies:  Allergies  Allergen Reactions   Lisinopril Swelling    Lip swelling   Simvastatin Other (See Comments)    Severe rhabdomyolysis.   Wellbutrin [Bupropion] Swelling    Facial Swelling    Past Medical History, Surgical history, Social history, and Family History were reviewed and updated.  Review of Systems: All other 10 point review of systems is negative.   Physical Exam:  weight is 189 lb (85.7 kg). Her oral temperature is 98.2 F (36.8 C). Her blood pressure is 138/85 and her pulse is 88. Her respiration is 17 and oxygen saturation is 100%.   Wt Readings from Last 3 Encounters:  02/03/23 189 lb (85.7 kg)  01/01/23 182 lb (82.6 kg)  12/10/22 183 lb (83 kg)    Ocular: Sclerae unicteric, pupils equal, round  and reactive to light Ear-nose-throat: Oropharynx clear, dentition fair Lymphatic: No cervical or supraclavicular adenopathy Lungs no rales or rhonchi, good excursion bilaterally Heart regular rate and rhythm, no murmur appreciated Abd soft, nontender, positive bowel sounds MSK no focal spinal tenderness, no joint edema Neuro: non-focal, well-oriented, appropriate affect Breasts: Deferred   Lab Results  Component Value Date   WBC 5.6 02/03/2023   HGB 13.6 02/03/2023   HCT 38.7 02/03/2023   MCV 89.0 02/03/2023   PLT 160 02/03/2023   Lab Results  Component Value Date   FERRITIN 148 08/01/2022   IRON 104 08/01/2022   TIBC 386 08/01/2022   UIBC 282 08/01/2022   IRONPCTSAT 27  08/01/2022   Lab Results  Component Value Date   RETICCTPCT 1.2 03/01/2015   RBC 4.35 02/03/2023   RETICCTABS 52.1 03/01/2015   No results found for: "KPAFRELGTCHN", "LAMBDASER", "KAPLAMBRATIO" No results found for: "IGGSERUM", "IGA", "IGMSERUM" No results found for: "TOTALPROTELP", "ALBUMINELP", "A1GS", "A2GS", "BETS", "BETA2SER", "GAMS", "MSPIKE", "SPEI"   Chemistry      Component Value Date/Time   NA 143 11/27/2022 0906   NA 146 (H) 10/16/2017 0804   NA 141 03/13/2017 0807   K 4.2 11/27/2022 0906   K 3.8 10/16/2017 0804   K 3.9 03/13/2017 0807   CL 102 11/27/2022 0906   CL 106 10/16/2017 0804   CO2 26 11/27/2022 0906   CO2 29 10/16/2017 0804   CO2 27 03/13/2017 0807   BUN 13 11/27/2022 0906   BUN 10 10/16/2017 0804   BUN 12.4 03/13/2017 0807   CREATININE 0.74 11/27/2022 0906   CREATININE 0.79 08/01/2022 0932   CREATININE 1.0 10/16/2017 0804   CREATININE 0.8 03/13/2017 0807      Component Value Date/Time   CALCIUM 9.5 11/27/2022 0906   CALCIUM 9.3 10/16/2017 0804   CALCIUM 9.6 03/13/2017 0807   ALKPHOS 75 11/27/2022 0906   ALKPHOS 72 10/16/2017 0804   ALKPHOS 70 03/13/2017 0807   AST 19 11/27/2022 0906   AST 17 08/01/2022 0932   AST 16 03/13/2017 0807   ALT 16 11/27/2022 0906   ALT 16 08/01/2022 0932   ALT 28 10/16/2017 0804   ALT 18 03/13/2017 0807   BILITOT 0.8 11/27/2022 0906   BILITOT 0.8 08/01/2022 0932   BILITOT 0.93 03/13/2017 0807       Impression and Plan: Mallory Morales is a lovely 67 yo African American female with history of stage II adenocarcinoma of the sigmoid colon, Oncotype score was 20. She had 36 lymph nodes that were negative. She had her surgery in January of 2013 and did not require adjuvant treatment.  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  Hyper coag panel was unremarkable.  She will continue her same regimen with maintenance Xarelto 5 mg PO daily.  CEA pending.  Iron studies are pending. We will replace  if needed.  Follow-up in 6 months.   Mallory Dawson, NP 2/13/20248:37 AM

## 2023-02-19 ENCOUNTER — Ambulatory Visit (INDEPENDENT_AMBULATORY_CARE_PROVIDER_SITE_OTHER): Payer: 59 | Admitting: Family Medicine

## 2023-02-19 ENCOUNTER — Encounter (INDEPENDENT_AMBULATORY_CARE_PROVIDER_SITE_OTHER): Payer: Self-pay | Admitting: Family Medicine

## 2023-02-19 VITALS — BP 121/74 | HR 99 | Temp 97.9°F | Ht 64.0 in | Wt 183.0 lb

## 2023-02-19 DIAGNOSIS — G4709 Other insomnia: Secondary | ICD-10-CM

## 2023-02-19 DIAGNOSIS — I10 Essential (primary) hypertension: Secondary | ICD-10-CM | POA: Diagnosis not present

## 2023-02-19 DIAGNOSIS — R7303 Prediabetes: Secondary | ICD-10-CM | POA: Diagnosis not present

## 2023-02-19 DIAGNOSIS — Z6831 Body mass index (BMI) 31.0-31.9, adult: Secondary | ICD-10-CM

## 2023-02-19 DIAGNOSIS — E669 Obesity, unspecified: Secondary | ICD-10-CM

## 2023-02-19 DIAGNOSIS — E559 Vitamin D deficiency, unspecified: Secondary | ICD-10-CM

## 2023-02-19 DIAGNOSIS — F3289 Other specified depressive episodes: Secondary | ICD-10-CM

## 2023-02-19 MED ORDER — VITAMIN D (ERGOCALCIFEROL) 1.25 MG (50000 UNIT) PO CAPS: 50000.0000 [IU] | ORAL_CAPSULE | ORAL | 0 refills | Status: AC

## 2023-02-19 MED ORDER — TOPIRAMATE 50 MG PO TABS: 50.0000 mg | ORAL_TABLET | Freq: Every day | ORAL | 0 refills | Status: DC

## 2023-02-19 MED ORDER — METFORMIN HCL 500 MG PO TABS: 500.0000 mg | ORAL_TABLET | Freq: Every day | ORAL | 0 refills | Status: AC

## 2023-02-19 MED ORDER — HYDROCHLOROTHIAZIDE 25 MG PO TABS: 25.0000 mg | ORAL_TABLET | Freq: Every day | ORAL | 0 refills | Status: AC

## 2023-03-09 NOTE — Progress Notes (Unsigned)
Chief Complaint:   OBESITY Mallory Morales is here to discuss her progress with her obesity treatment plan along with follow-up of her obesity related diagnoses. Mallory Morales is on the [employer] and states she is following her eating plan approximately 30% of the time. Mallory Morales states she is doing 0 minutes 0 times per week.  Today's visit was #: 20 Starting weight: 191 lbs Starting date: 12/18/2021 Today's weight: 183 lbs Today's date: 02/19/2023 Total lbs lost to date: 8 Total lbs lost since last in-office visit: 0  Interim History: Mallory Morales has been struggling with meal planning and with increased work. She expects the next 3 months to be especially hectic at her job. Her protein has decreased which is likely decreasing her RMR.    Subjective:   1. Other insomnia Mallory Morales is struggling with falling asleep and staying asleep.  This is worse with increased stress.  2. Vitamin D deficiency Mallory Morales's last vitamin D was close to goal.  She is due to have labs rechecked in 1 month.  3. Prediabetes Mallory Morales continues to work on her diet to control her prediabetes mellitus, but she has struggled more recently.  No side effects were noted with metformin.  4. Essential hypertention Mallory Morales's blood pressure is well-controlled despite extra stress.  She denies chest pain or headache.  5. Emotional Eating Behavior Mallory Morales is doing well on Topamax with no side effects mentioned.  Assessment/Plan:   1. Other insomnia Mallory Morales was advised to try melatonin and magnesium supplementations at bedtime.  Ways to help her not ruminate on her work stress while trying to fall asleep were discussed.  2. Vitamin D deficiency Mallory Morales will continue prescription vitamin D, and we will refill for 1 month.  We will recheck labs in 1 month.  - Vitamin D, Ergocalciferol, (DRISDOL) 1.25 MG (50000 UNIT) CAPS capsule; Take 1 capsule (50,000 Units total) by mouth every 14 (fourteen) days.  Dispense: 2 capsule;  Refill: 0  3. Prediabetes We will refill metformin for 1 month.  Mallory Morales will work on decreasing simple carbohydrates.  - metFORMIN (GLUCOPHAGE) 500 MG tablet; Take 1 tablet (500 mg total) by mouth daily.  Dispense: 30 tablet; Refill: 0  4. Essential hypertention We will refill hydrochlorothiazide for 1 month.  Mallory Morales will continue to work on her diet and exercise, and proper sleep.  - hydrochlorothiazide (HYDRODIURIL) 25 MG tablet; Take 1 tablet (25 mg total) by mouth daily. Takes 1 tab in the morning  Dispense: 30 tablet; Refill: 0  5. Emotional Eating Behavior Mallory Morales will continue Topamax, and we will refill for 1 month.  - topiramate (TOPAMAX) 50 MG tablet; Take 1 tablet (50 mg total) by mouth daily.  Dispense: 30 tablet; Refill: 0  6. BMI 31.0-31.9,adult  7. Obesity, Beginning BMI 32.79 Mallory Morales is currently in the action stage of change. As such, her goal is to continue with weight loss efforts. She has agreed to the [employer].   Mallory Morales is okay to use protein shakes instead of skipping meals.  Behavioral modification strategies: increasing lean protein intake.  Mallory Morales has agreed to follow-up with our clinic in 4 weeks. She was informed of the importance of frequent follow-up visits to maximize her success with intensive lifestyle modifications for her multiple health conditions.   Objective:   Blood pressure 121/74, pulse 99, temperature 97.9 F (36.6 C), height 5\' 4"  (1.626 m), weight 183 lb (83 kg), SpO2 97 %. Body mass index is 31.41 kg/m.  Lab Results  Component Value Date  CREATININE 0.80 02/03/2023   BUN 11 02/03/2023   NA 142 02/03/2023   K 4.0 02/03/2023   CL 103 02/03/2023   CO2 29 02/03/2023   Lab Results  Component Value Date   ALT 13 02/03/2023   AST 14 (L) 02/03/2023   ALKPHOS 71 02/03/2023   BILITOT 0.7 02/03/2023   Lab Results  Component Value Date   HGBA1C 5.9 (H) 11/27/2022   HGBA1C 5.9 (H) 06/25/2022   HGBA1C 6.4 (H) 12/18/2021    Lab Results  Component Value Date   INSULIN 10.8 11/27/2022   INSULIN 9.7 06/25/2022   INSULIN 18.3 12/18/2021   Lab Results  Component Value Date   TSH 1.270 12/18/2021   Lab Results  Component Value Date   CHOL 234 (H) 11/27/2022   HDL 83 11/27/2022   LDLCALC 133 (H) 11/27/2022   TRIG 107 11/27/2022   CHOLHDL 3.1 12/18/2021   Lab Results  Component Value Date   VD25OH 47.7 11/27/2022   VD25OH 96.3 06/25/2022   VD25OH 6.7 (L) 12/18/2021   Lab Results  Component Value Date   WBC 5.6 02/03/2023   HGB 13.6 02/03/2023   HCT 38.7 02/03/2023   MCV 89.0 02/03/2023   PLT 160 02/03/2023   Lab Results  Component Value Date   IRON 66 02/03/2023   TIBC 388 02/03/2023   FERRITIN 127 02/03/2023   Attestation Statements:   Reviewed by clinician on day of visit: allergies, medications, problem list, medical history, surgical history, family history, social history, and previous encounter notes.   I, Trixie Dredge, am acting as transcriptionist for Dennard Nip, MD.  I have reviewed the above documentation for accuracy and completeness, and I agree with the above. -  Dennard Nip, MD

## 2023-03-23 ENCOUNTER — Other Ambulatory Visit: Payer: Self-pay | Admitting: Hematology & Oncology

## 2023-03-23 DIAGNOSIS — I82413 Acute embolism and thrombosis of femoral vein, bilateral: Secondary | ICD-10-CM

## 2023-03-23 DIAGNOSIS — D501 Sideropenic dysphagia: Secondary | ICD-10-CM

## 2023-03-23 DIAGNOSIS — C186 Malignant neoplasm of descending colon: Secondary | ICD-10-CM

## 2023-03-31 ENCOUNTER — Encounter (HOSPITAL_BASED_OUTPATIENT_CLINIC_OR_DEPARTMENT_OTHER): Payer: Self-pay | Admitting: Internal Medicine

## 2023-03-31 ENCOUNTER — Ambulatory Visit (INDEPENDENT_AMBULATORY_CARE_PROVIDER_SITE_OTHER): Payer: 59 | Admitting: Internal Medicine

## 2023-03-31 ENCOUNTER — Telehealth: Payer: Self-pay | Admitting: Internal Medicine

## 2023-03-31 VITALS — BP 152/84 | HR 89 | Ht 65.0 in | Wt 189.2 lb

## 2023-03-31 DIAGNOSIS — Z8249 Family history of ischemic heart disease and other diseases of the circulatory system: Secondary | ICD-10-CM | POA: Diagnosis not present

## 2023-03-31 DIAGNOSIS — E785 Hyperlipidemia, unspecified: Secondary | ICD-10-CM | POA: Diagnosis not present

## 2023-03-31 DIAGNOSIS — I10 Essential (primary) hypertension: Secondary | ICD-10-CM

## 2023-03-31 NOTE — Patient Instructions (Signed)
Medication Instructions:  Dr. Rennis Golden recommends Repatha Sureclick 140mg /mL (PCSK9). This is an injectable cholesterol medication self-administered once every 14 days. This medication will likely need prior approval with your insurance company, which we will work on. If the medication is not approved initially, we may need to do an appeal with your insurance.   Administer medication in area of fatty tissue such as abdomen, outer thigh, back of upper arm - and rotate site with each injection Store medication in refrigerator until ready to administer - allow to sit at room temp for 30 mins - 1 hour prior to injection Dispose of medication in a SHARPS container - your pharmacy should be able to direct you on this and proper disposal   If you need a co-pay card for Repatha: Lawsponsor.fr If you need a co-pay card for Praluent: [email or web address]  Patient Assistance:    These foundations have funds at various times.   The PAN Foundation: https://www.panfoundation.org/disease-funds/hypercholesterolemia/ -- can sign up for wait list  The [facility] offers assistance to help pay for medication copays.  They will cover copays for all cholesterol lowering meds, including statins, fibrates, omega-3 fish oils like Vascepa, ezetimibe, Repatha, Praluent, Nexletol, Nexlizet.  The cards are usually good for $2,500 or 12 months, whichever comes first. Go to healthwellfoundation.org Click on "Apply Now" Answer questions as to whom is applying (patient or representative) Your disease fund will be "hypercholesterolemia - Medicare access" They will ask questions about finances and which medications you are taking for cholesterol When you submit, the approval is usually within minutes.  You will need to print the card information from the site You will need to show this information to your pharmacy, they will bill your Medicare Part D plan first -then bill  Health Well --for the copay.   You can also call them at [phone], although the hold times can be quite long.     *If you need a refill on your cardiac medications before your next appointment, please call your pharmacy*   Lab Work: FASTING lab work to check cholesterol in 4-5 months  If you have labs (blood work) drawn today and your tests are completely normal, you will receive your results only by: MyChart Message (if you have MyChart) OR A paper copy in the mail If you have any lab test that is abnormal or we need to change your treatment, we will call you to review the results.   Testing/Procedures: Genetic Test -- results available via email in 2-3 weeks and Dr. Rennis Golden will reach out to explain also   Follow-Up: At [facility], you and your health needs are our priority.  As part of our continuing mission to provide you with exceptional heart care, we have created designated Provider Care Teams.  These Care Teams include your primary Cardiologist (physician) and Advanced Practice Providers (APPs -  Physician Assistants and Nurse Practitioners) who all work together to provide you with the care you need, when you need it.  We recommend signing up for the patient portal called "MyChart".  Sign up information is provided on this After Visit Summary.  MyChart is used to connect with patients for Virtual Visits (Telemedicine).  Patients are able to view lab/test results, encounter notes, upcoming appointments, etc.  Non-urgent messages can be sent to your provider as well.   To learn more about what you can do with MyChart, go to [email or web address].    Your next appointment:    4-5 months with Dr. Rennis Golden

## 2023-03-31 NOTE — Progress Notes (Signed)
LIPID CLINIC CONSULT NOTE  Chief Complaint:  Manage dyslipidemia  Primary Care Physician: Blair HeysEhinger, Robert, MD  Primary Cardiologist:  None  HPI:  Mallory Morales is a 67 y.o. female who is being seen today for the evaluation of dyslipidemia at the request of Blair HeysEhinger, Robert, MD. this is a 67 year old female kindly referred for evaluation management of dyslipidemia.  She reports family history of heart disease including her father who had an MI in her sister who has heart failure.  She has another sister who has dementia and she is quite concerned that she may be a genetic risk for this.  I reviewed the connection with APO E regarding dementia and high cholesterol which may be an issue for her.  She has tried numerous statins in the past and had side effects but recently has been on fluvastatin which is tolerated.  That being said her cholesterol remains high with total 234, HDL 84, triglycerides 107 and LDL 133.  She says that the statins have not really lowered her cholesterol much in the past which makes me wonder if she has a high LP(a).  As far as I know this has not been assessed.  She is on Xarelto due to a history of recurrent blood clots which would also potentially be consistent with a high LP(a).  PMHx:  Past Medical History:  Diagnosis Date   Anemia, iron deficiency 01/27/2012   Back pain    Cancer    Clotting disorder    Colon cancer    T3N0M0, emergency surgery/with colostomy done-no further tx.   Complication of anesthesia    "hard time getting breath" post op   Constipation    DVT (deep venous thrombosis)    Edema of both lower extremities    Fatigue    History of stomach ulcers    Hyperlipemia    Hypertension    Joint pain    Lactose intolerance    Prediabetes    Pulmonary embolism    taking injections -last done 1'14(Arixtra)   SOB (shortness of breath)     Past Surgical History:  Procedure Laterality Date   ABDOMINAL HYSTERECTOMY     COLON RESECTION   01/14/2012   Procedure: COLON RESECTION;  Surgeon: Adolph Pollackodd J Rosenbower, MD;  [facility];  Service: General;  Laterality: N/A;  left colectomy, mobilization of splenic flexure, ostomy.   COLOSTOMY  01/14/2012   COLOSTOMY TAKEDOWN N/A 02/24/2013   Procedure: Laparoscopic Assisted Colostomy Closure;  Surgeon: Adolph Pollackodd J Rosenbower, MD;  Location: [facility] ORS;  Service: General;  Laterality: N/A;  Laparoscopic Assisted Colostomy Closure   FLEXIBLE SIGMOIDOSCOPY N/A 03/17/2014   Procedure: FLEXIBLE SIGMOIDOSCOPY;  Surgeon: Theda BelfastPatrick D Hung, MD;  Location: [facility] ENDOSCOPY;  Service: Endoscopy;  Laterality: N/A;   FLEXIBLE SIGMOIDOSCOPY N/A 04/07/2014   Procedure: FLEXIBLE SIGMOIDOSCOPY;  Surgeon: Theda BelfastPatrick D Hung, MD;  Location: [facility] ENDOSCOPY;  Service: Endoscopy;  Laterality: N/A;  no sedation   HERNIA REPAIR     HOT HEMOSTASIS N/A 03/17/2014   Procedure: HOT HEMOSTASIS (ARGON PLASMA COAGULATION/BICAP);  Surgeon: Theda BelfastPatrick D Hung, MD;  [facility];  Service: Endoscopy;  Laterality: N/A;   HOT HEMOSTASIS N/A 04/07/2014   Procedure: HOT HEMOSTASIS (ARGON PLASMA COAGULATION/BICAP);  Surgeon: Theda BelfastPatrick D Hung, MD;  [facility];  Service: Endoscopy;  Laterality: N/A;    FAMHx:  Family History  Problem Relation Age of Onset   Hypertension Mother    Alcoholism Mother    Obesity Mother  Alcoholism Father    Hyperlipidemia Father    Heart attack Father    Hypertension Father    Heart disease Father    Stomach cancer Sister    Pancreatic cancer Brother    Cancer Brother        pancreatic    SOCHx:   reports that she quit smoking about 37 years ago. Her smoking use included cigarettes. She started smoking about 48 years ago. She has a 5.50 pack-year smoking history. She has never used smokeless tobacco. She reports current alcohol use of about 1.0 standard drink of alcohol per week. She reports that she does not use drugs.  ALLERGIES:  Allergies  Allergen Reactions   Lisinopril Swelling    Lip  swelling   Simvastatin Other (See Comments)    Severe rhabdomyolysis.   Wellbutrin [Bupropion] Swelling    Facial Swelling    ROS: Pertinent items noted in HPI and remainder of comprehensive ROS otherwise negative.  HOME MEDS: Current Outpatient Medications on File Prior to Visit  Medication Sig Dispense Refill   amLODipine (NORVASC) 2.5 MG tablet Take 2.5 mg by mouth daily.     fluvastatin (LESCOL) 40 MG capsule Take 40 mg by mouth at bedtime. Take 2 capsules by mouth daily     hydrochlorothiazide (HYDRODIURIL) 25 MG tablet Take 1 tablet (25 mg total) by mouth daily. Takes 1 tab in the morning 30 tablet 0   metFORMIN (GLUCOPHAGE) 500 MG tablet Take 1 tablet (500 mg total) by mouth daily. 30 tablet 0   Vitamin D, Ergocalciferol, (DRISDOL) 1.25 MG (50000 UNIT) CAPS capsule Take 1 capsule (50,000 Units total) by mouth every 14 (fourteen) days. 2 capsule 0   XARELTO 2.5 MG TABS tablet Take 2 tablets by mouth once daily 180 tablet 0   No current facility-administered medications on file prior to visit.    LABS/IMAGING: No results found for this or any previous visit (from the past 48 hour(s)). No results found.  LIPID PANEL:    Component Value Date/Time   CHOL 234 (H) 11/27/2022 0906   TRIG 107 11/27/2022 0906   HDL 83 11/27/2022 0906   CHOLHDL 3.1 12/18/2021 1038   CHOLHDL 3.3 12/14/2016 0531   VLDL 18 12/14/2016 0531   LDLCALC 133 (H) 11/27/2022 0906    WEIGHTS: Wt Readings from Last 3 Encounters:  03/31/23 189 lb 3.2 oz (85.8 kg)  02/19/23 183 lb (83 kg)  02/03/23 189 lb (85.7 kg)    VITALS: BP (!) 152/84   Pulse 89   Ht 5\' 5"  (1.651 m)   Wt 189 lb 3.2 oz (85.8 kg)   SpO2 100%   BMI 31.48 kg/m   EXAM: Deferred  EKG: Deferred  ASSESSMENT: Mixed dyslipidemia, goal LDL less than 70 Hypertension Strong family history of early onset heart disease History of thrombosis on Xarelto High intensity statin intolerant-currently on fluvastatin  PLAN: 1.   Mallory Morales remains uncontrolled with regards to her lipids.  She is on a low potency statin but could not tolerate rosuvastatin or atorvastatin or other higher potency statins.  She remains well above a cholesterol target as she would be intermediate to high risk based on family history, hypertension, age and her lipids.  I would advise considering a PCSK9 inhibitor.  Will also assess for LP(a).  She would be interested in genetic testing as well given the early onset heart disease and the fact that her cholesterol has not improved significantly on statins.  She understands that this  may be covered by insurance but if not could result in a $299 charge.  Will plan blood work and follow-up in about 3 to 4 months on therapy.  Thanks again for the kind referral.  Chrystie Nose, MD, [facility]  [place]  [facility]  Medical Director of the Advanced Lipid Disorders &  Cardiovascular Risk Reduction Clinic Diplomate of the American Board of Clinical Lipidology Attending Cardiologist  Direct Dial: [phone]  Fax: [phone]  Website:  [email or web address] 03/31/2023, 1:03 PM

## 2023-03-31 NOTE — Telephone Encounter (Signed)
Genetic test for dyslipidemia/ASCVD ordered ([facility]) Cheek swab completed in office Specimen and necessary paperwork mailed. ID: [id number]

## 2023-04-01 LAB — LIPOPROTEIN A (LPA): Lipoprotein (a): 505.5 nmol/L — ABNORMAL HIGH (ref <75.0)

## 2023-04-22 ENCOUNTER — Encounter: Payer: Self-pay | Admitting: Internal Medicine

## 2023-04-22 DIAGNOSIS — E785 Hyperlipidemia, unspecified: Secondary | ICD-10-CM

## 2023-04-23 ENCOUNTER — Encounter: Payer: Self-pay | Admitting: Hematology & Oncology

## 2023-04-23 ENCOUNTER — Telehealth: Payer: Self-pay

## 2023-04-23 ENCOUNTER — Other Ambulatory Visit (HOSPITAL_COMMUNITY): Payer: Self-pay

## 2023-04-23 NOTE — Telephone Encounter (Signed)
-----   Message from Carolan Clines, CPhT sent at 04/23/2023  4:05 PM EDT ----- Regarding: RE: repatha sureclick 140mg /mL PA - new start Pharmacy Patient Advocate Encounter  Received notification from RN that prior authorization for Repatha 140mg /ml is required/requested.  PA submitted on 5.2.24 to (ins) [employer] via [employer] or (Medicaid) confirmation # [id number] Status is pending     ----- Message ----- From: Lindell Spar, RN Sent: 04/23/2023   1:28 PM EDT To: Rx Prior Auth Team Subject: repatha sureclick 140mg /mL PA - new start      Hey! This patient needs a Repatha PA. Her new insurance card is [employer], scanned to chart today.   Dx:  E78.01 (familial hypercholesterolemia) LPa is 505, LDL 133, goal LDL less than 70 Rhabdomyolysis on simvastatin   Thanks!

## 2023-04-29 ENCOUNTER — Other Ambulatory Visit (HOSPITAL_COMMUNITY): Payer: Self-pay

## 2023-05-07 NOTE — Telephone Encounter (Signed)
Pharmacy Patient Advocate Encounter  Prior Authorization for REPATHA 140MG /ML has been Approved by [employer] [place] (ins).    KEY # [id number]  Effective dates: 5.2.24 through 5.3.25

## 2023-05-12 MED ORDER — REPATHA SURECLICK 140 MG/ML ~~LOC~~ SOAJ: 140.0000 mg | SUBCUTANEOUS | 3 refills | Status: DC

## 2023-05-12 NOTE — Addendum Note (Signed)
Addended by: Lindell Spar on: 05/12/2023 12:25 PM   Modules accepted: Orders

## 2023-05-12 NOTE — Telephone Encounter (Signed)
MyChart message sent to patient with update.

## 2023-06-01 ENCOUNTER — Other Ambulatory Visit: Payer: Self-pay | Admitting: Hematology & Oncology

## 2023-06-01 DIAGNOSIS — D501 Sideropenic dysphagia: Secondary | ICD-10-CM

## 2023-06-01 DIAGNOSIS — I82413 Acute embolism and thrombosis of femoral vein, bilateral: Secondary | ICD-10-CM

## 2023-06-01 DIAGNOSIS — C186 Malignant neoplasm of descending colon: Secondary | ICD-10-CM

## 2023-06-02 ENCOUNTER — Encounter: Payer: Self-pay | Admitting: Hematology & Oncology

## 2023-06-02 MED ORDER — XARELTO 2.5 MG PO TABS: 5.0000 mg | ORAL_TABLET | Freq: Every day | ORAL | 0 refills | Status: DC

## 2023-06-03 ENCOUNTER — Encounter: Payer: Self-pay | Admitting: Internal Medicine

## 2023-07-07 ENCOUNTER — Encounter: Payer: Self-pay | Admitting: Internal Medicine

## 2023-07-07 NOTE — Telephone Encounter (Signed)
Call to patient to ensure she was aware of message.  Recommendations given and she will follow up with PCP

## 2023-07-10 ENCOUNTER — Ambulatory Visit
Admission: EM | Admit: 2023-07-10 | Discharge: 2023-07-10 | Disposition: A | Discharge status: Home / Self Care | Payer: BC Managed Care – PPO | Attending: Family Medicine | Admitting: Family Medicine

## 2023-07-10 ENCOUNTER — Ambulatory Visit (HOSPITAL_BASED_OUTPATIENT_CLINIC_OR_DEPARTMENT_OTHER)
Admit: 2023-07-10 | Discharge: 2023-07-10 | Disposition: A | Discharge status: Home / Self Care | Payer: BC Managed Care – PPO | Attending: Family Medicine | Admitting: Family Medicine

## 2023-07-10 ENCOUNTER — Encounter: Payer: Self-pay | Admitting: Hematology & Oncology

## 2023-07-10 ENCOUNTER — Telehealth: Payer: Self-pay | Admitting: Family Medicine

## 2023-07-10 DIAGNOSIS — M79662 Pain in left lower leg: Secondary | ICD-10-CM | POA: Diagnosis not present

## 2023-07-10 DIAGNOSIS — R2242 Localized swelling, mass and lump, left lower limb: Secondary | ICD-10-CM | POA: Diagnosis not present

## 2023-07-10 DIAGNOSIS — L03116 Cellulitis of left lower limb: Secondary | ICD-10-CM | POA: Diagnosis not present

## 2023-07-10 DIAGNOSIS — M7989 Other specified soft tissue disorders: Secondary | ICD-10-CM

## 2023-07-10 MED ORDER — AMOXICILLIN-POT CLAVULANATE 875-125 MG PO TABS: 1.0000 | ORAL_TABLET | Freq: Two times a day (BID) | ORAL | 0 refills | Status: AC

## 2023-07-10 NOTE — ED Provider Notes (Signed)
[facility]    CSN: [id number] Arrival date & time: 07/10/23  1255      History   Chief Complaint Chief Complaint  Patient presents with   Leg Swelling    HPI Mallory Morales is a 67 y.o. female.   HPI Here for swelling and discomfort/burning in her left foot and ankle.  It has been going on for about a month.  No fever or chills.  She is now experiencing some thickening of the skin on her medial left ankle and over the dorsum of her foot.  Also she is having the burning pain now.  She reports that a long time ago she had an injury to her foot where she dropped a piece of plywood on it.  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  Pain has not been exquisite; she does note the right before this happened she got started on Repatha and she ate caviar.    Past Medical History:  Diagnosis Date   Anemia, iron deficiency 01/27/2012   Back pain    Cancer (HCC)    Clotting disorder (HCC)    Colon cancer (HCC)    T3N0M0, emergency surgery/with colostomy done-no further tx.   Complication of anesthesia    "hard time getting breath" post op   Constipation    DVT (deep venous thrombosis) (HCC)    Edema of both lower extremities    Fatigue    History of stomach ulcers    Hyperlipemia    Hypertension    Joint pain    Lactose intolerance    Prediabetes    Pulmonary embolism (HCC)    taking injections -last done 1'14(Arixtra)   SOB (shortness of breath)     Patient Active Problem List   Diagnosis Date Noted   BMI 31.0-31.9,adult 02/19/2023   Obesity, Beginning BMI 32.79 02/19/2023   Other insomnia 02/19/2023   Other hyperlipidemia 12/10/2022   Pure hypercholesterolemia 11/27/2022   Depression 11/06/2022   Obesity (BMI 30.0-34.9) 09/08/2022   Pre-diabetes 07/16/2022   Vitamin D deficiency 05/13/2022   Essential hypertention 05/13/2022   Arm DVT (deep venous thromboembolism), acute,  right (HCC) 03/13/2017   Personal history of DVT (deep vein thrombosis) 12/14/2016   Rhabdomyolysis 12/13/2016   Lower GI bleeding 03/28/2013   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   COLOSTOMY  01/14/2012   COLOSTOMY TAKEDOWN N/A 02/24/2013   Procedure: Laparoscopic Assisted Colostomy Closure;  Surgeon: Adolph Pollack, MD;  Location: [facility];  Service: General;  Laterality: N/A;  Laparoscopic Assisted Colostomy Closure   FLEXIBLE SIGMOIDOSCOPY N/A 03/17/2014   Procedure: FLEXIBLE SIGMOIDOSCOPY;  Surgeon: Theda Belfast, MD;  Location: [facility];  Service: Endoscopy;  Laterality: N/A;   FLEXIBLE SIGMOIDOSCOPY N/A 04/07/2014   Procedure: FLEXIBLE SIGMOIDOSCOPY;  Surgeon: Theda Belfast, MD;  Location: [facility];  Service: Endoscopy;  Laterality: N/A;  no sedation   HERNIA REPAIR     HOT HEMOSTASIS N/A 03/17/2014   Procedure: HOT HEMOSTASIS (ARGON PLASMA COAGULATION/BICAP);  Surgeon: Theda Belfast, MD;  Location: [facility];  Service: Endoscopy;  Laterality: N/A;   HOT HEMOSTASIS N/A 04/07/2014   Procedure: HOT HEMOSTASIS (ARGON PLASMA COAGULATION/BICAP);  Surgeon: Theda Belfast, MD;  Location: [facility];  Service: Endoscopy;  Laterality: N/A;    OB History     Gravida  0   Para  0   Term  0   Preterm  0   AB  0   Living  0      SAB  0   IAB  0   Ectopic  0   Multiple  0   Live Births  0            Home Medications     Prior to Admission medications   Medication Sig Start Date End Date Taking? Authorizing Provider  amoxicillin-clavulanate (AUGMENTIN) 875-125 MG tablet Take 1 tablet by mouth 2 (two) times daily for 7 days. 07/10/23 07/17/23 Yes Zenia Resides, MD  amLODipine (NORVASC) 2.5 MG tablet Take 2.5 mg by mouth daily. 08/27/22   [provider]  Evolocumab (REPATHA SURECLICK) 140 MG/ML SOAJ Inject 140 mg into the skin every 14 (fourteen) days. 05/12/23   Hilty, Lisette Abu, MD  fluvastatin (LESCOL) 40 MG capsule Take 40 mg by mouth at bedtime. Take 2 capsules by mouth daily    [provider]  hydrochlorothiazide (HYDRODIURIL) 25 MG tablet Take 1 tablet (25 mg total) by mouth daily. Takes 1 tab in the morning 02/19/23   Quillian Quince D, MD  metFORMIN (GLUCOPHAGE) 500 MG tablet Take 1 tablet (500 mg total) by mouth daily. 02/19/23   Quillian Quince D, MD  rivaroxaban (XARELTO) 2.5 MG TABS tablet Take 2 tablets (5 mg total) by mouth daily. 06/02/23   Josph Macho, MD  Vitamin D, Ergocalciferol, (DRISDOL) 1.25 MG (50000 UNIT) CAPS capsule Take 1 capsule (50,000 Units total) by mouth every 14 (fourteen) days. 02/19/23   Wilder Glade, MD  XARELTO 2.5 MG TABS tablet Take 2 tablets by mouth once daily 06/01/23   Ennever, Rose Phi, MD    Family History Family History  Problem Relation Age of Onset   Hypertension Mother    Alcoholism Mother    Obesity Mother    Alcoholism Father    Hyperlipidemia Father    Heart attack Father    Hypertension Father    Heart disease Father    Stomach cancer Sister    Pancreatic cancer Brother    Cancer Brother        pancreatic    Social History Social History   Tobacco Use   Smoking status: Former    Current packs/day: 0.00    Average packs/day: 0.5 packs/day for 11.0 years (5.5 ttl pk-yrs)    Types: Cigarettes    Start date: 08/26/1974    Quit date: 05/16/1985    Years since quitting: 38.1   Smokeless tobacco: Never   Tobacco comments:     quit 28 years ago  Vaping Use   Vaping status: Never Used  Substance Use Topics   Alcohol use: Yes    Alcohol/week: 1.0 standard drink of alcohol    Types: 1 Glasses of wine per week    Comment: Occ. wine   Drug use: No     Allergies   Lisinopril, Simvastatin, and Wellbutrin [bupropion]   Review of  Systems Review of Systems   Physical Exam Triage Vital Signs ED Triage Vitals [07/10/23 1402]  Encounter Vitals Group     BP (!) 159/97     Systolic BP Percentile      Diastolic BP Percentile      Pulse Rate 88     Resp 20     Temp 98 F (36.7 C)     Temp Source Oral     SpO2 98 %     Weight      Height      Head Circumference      Peak Flow      Pain Score 4     Pain Loc      Pain Education      Exclude from Growth Chart    No data found.  Updated Vital Signs BP (!) 159/97 (BP Location: Left Arm)   Pulse 88   Temp 98 F (36.7 C) (Oral)   Resp 20   SpO2 98%   Visual Acuity Right Eye Distance:   Left Eye Distance:   Bilateral Distance:    Right Eye Near:   Left Eye Near:    Bilateral Near:     Physical Exam Vitals reviewed.  Constitutional:      General: She is not in acute distress.    Appearance: She is not ill-appearing, toxic-appearing or diaphoretic.  Musculoskeletal:     Comments: There is no swelling of the right foot or ankle.  The left foot on the dorsum is swollen and the skin is thickened and there.  There is also swelling into the lower left leg.  The skin is thickened, especially on the medial side and there is an indentation of the medial left ankle tissue.  There is no open ulceration at this point.  There is also a hyperpigmented lesion about 6 x 3 cm on the dorsum of her foot.  She states it has been there a while but it is more prominent than before.  Capillary refill is normal distally.    Skin:    Coloration: Skin is not pale.  Neurological:     General: No focal deficit present.     Mental Status: She is alert and oriented to  person, place, and time.      UC Treatments / Results  Labs (all labs ordered are listed, but only abnormal results are displayed) Labs Reviewed  COMPREHENSIVE METABOLIC PANEL  URIC ACID    EKG   Radiology No results found.  Procedures Procedures (including critical care time)  Medications Ordered in UC Medications - No data to display  Initial Impression / Assessment and Plan / UC Course  I have reviewed the triage vital signs and the nursing notes.  Pertinent labs & imaging results that were available during my care of the patient were reviewed by me and considered in my medical decision making (see chart for details).        CMP and uric acid are drawn today, though with it being unilateral I doubt that it is any trouble with her renal function.  Her renal function.  Augmentin is sent in for possible cellulitis.  I discussed with her that I think this may be due in the end to venous stasis.  Venous Doppler is done of this leg to make sure that that is not contributing, though she is already on a blood thinner.  She will follow-up with her primary care and cardiology.  We discussed her trying compression  stockings or socks in the short-term.  Final Clinical Impressions(s) / UC Diagnoses   Final diagnoses:  Left leg swelling  Localized swelling of left foot  Cellulitis of left lower extremity     Discharge Instructions      Take amoxicillin-clavulanate 875 mg--1 tab twice daily with food for 7 days  We have drawn blood to check your kidney and liver function and uric acid.  Staff will notify if there is anything significantly abnormal  You can try a compression stocking or sock to help the fluid return from your foot  Please followup with primary care and cardiology         ED Prescriptions     Medication Sig Dispense Auth. Provider   amoxicillin-clavulanate (AUGMENTIN) 875-125 MG tablet Take 1 tablet by mouth 2 (two) times daily for 7 days.  14 tablet Jazmon Kos, Janace Aris, MD      PDMP not reviewed this encounter.   Zenia Resides, MD 07/10/23 1452

## 2023-07-10 NOTE — Telephone Encounter (Signed)
I called patient earlier this evening and let her know that her venous Doppler was negative.

## 2023-07-10 NOTE — Discharge Instructions (Addendum)
Take amoxicillin-clavulanate 875 mg--1 tab twice daily with food for 7 days  We have drawn blood to check your kidney and liver function and uric acid.  Staff will notify if there is anything significantly abnormal  You can try a compression stocking or sock to help the fluid return from your foot  Please followup with primary care and cardiology

## 2023-07-10 NOTE — ED Triage Notes (Signed)
Patient states over the past month her left foot/ankle has been swelling. States her skin is also discolored and she can't get her shoes on.

## 2023-07-11 LAB — COMPREHENSIVE METABOLIC PANEL
ALT: 22 IU/L (ref 0–32)
AST: 20 IU/L (ref 0–40)
Albumin: 4.8 g/dL (ref 3.9–4.9)
Alkaline Phosphatase: 93 IU/L (ref 44–121)
BUN/Creatinine Ratio: 25 (ref 12–28)
BUN: 17 mg/dL (ref 8–27)
Bilirubin Total: 0.9 mg/dL (ref 0.0–1.2)
CO2: 25 mmol/L (ref 20–29)
Calcium: 10.1 mg/dL (ref 8.7–10.3)
Chloride: 99 mmol/L (ref 96–106)
Creatinine, Ser: 0.67 mg/dL (ref 0.57–1.00)
Globulin, Total: 2.3 g/dL (ref 1.5–4.5)
Glucose: 80 mg/dL (ref 70–99)
Potassium: 3.9 mmol/L (ref 3.5–5.2)
Sodium: 141 mmol/L (ref 134–144)
Total Protein: 7.1 g/dL (ref 6.0–8.5)
eGFR: 98 mL/min/{1.73_m2} (ref >59)

## 2023-07-11 LAB — URIC ACID: Uric Acid: 5.9 mg/dL (ref 3.0–7.2)

## 2023-07-22 DIAGNOSIS — E785 Hyperlipidemia, unspecified: Secondary | ICD-10-CM | POA: Diagnosis not present

## 2023-07-24 ENCOUNTER — Ambulatory Visit: Payer: BC Managed Care – PPO | Attending: Internal Medicine | Admitting: Internal Medicine

## 2023-07-24 ENCOUNTER — Encounter: Payer: Self-pay | Admitting: Internal Medicine

## 2023-07-24 VITALS — BP 136/84 | HR 91 | Ht 65.0 in | Wt 193.8 lb

## 2023-07-24 DIAGNOSIS — Z86718 Personal history of other venous thrombosis and embolism: Secondary | ICD-10-CM

## 2023-07-24 DIAGNOSIS — Z7984 Long term (current) use of oral hypoglycemic drugs: Secondary | ICD-10-CM

## 2023-07-24 DIAGNOSIS — E119 Type 2 diabetes mellitus without complications: Secondary | ICD-10-CM | POA: Diagnosis not present

## 2023-07-24 DIAGNOSIS — E785 Hyperlipidemia, unspecified: Secondary | ICD-10-CM

## 2023-07-24 DIAGNOSIS — I10 Essential (primary) hypertension: Secondary | ICD-10-CM

## 2023-07-24 DIAGNOSIS — E7841 Elevated Lipoprotein(a): Secondary | ICD-10-CM

## 2023-07-24 NOTE — Progress Notes (Unsigned)
LIPID CLINIC CONSULT NOTE  Chief Complaint:  Manage dyslipidemia  Primary Care Physician: Blair Heys, MD  Primary Cardiologist:  None  HPI:  Mallory Morales is a 67 y.o. female who is being seen today for the evaluation of dyslipidemia at the request of Blair Heys, MD. this is a 67 year old female kindly referred for evaluation management of dyslipidemia.  She reports family history of heart disease including her father who had an MI in her sister who has heart failure.  She has another sister who has dementia and she is quite concerned that she may be a genetic risk for this.  I reviewed the connection with [facility] E regarding dementia and high cholesterol which may be an issue for her.  She has tried numerous statins in the past and had side effects but recently has been on fluvastatin which is tolerated.  That being said her cholesterol remains high with total 234, HDL 84, triglycerides 107 and LDL 133.  She says that the statins have not really lowered her cholesterol much in the past which makes me wonder if she has a high LP(a).  As far as I know this has not been assessed.  She is on Xarelto due to a history of recurrent blood clots which would also potentially be consistent with a high LP(a).  PMHx:  Past Medical History:  Diagnosis Date   Anemia, iron deficiency 01/27/2012   Back pain    Cancer (HCC)    Clotting disorder (HCC)    Colon cancer (HCC)    T3N0M0, emergency surgery/with colostomy done-no further tx.   Complication of anesthesia    "hard time getting breath" post op   Constipation    DVT (deep venous thrombosis) (HCC)    Edema of both lower extremities    Fatigue    History of stomach ulcers    Hyperlipemia    Hypertension    Joint pain    Lactose intolerance    Prediabetes    Pulmonary embolism (HCC)    taking injections -last done 1'14(Arixtra)   SOB (shortness of breath)     Past Surgical History:  Procedure Laterality Date   ABDOMINAL  HYSTERECTOMY     COLON RESECTION  01/14/2012   Procedure: COLON RESECTION;  Surgeon: Adolph Pollack, MD;  Location: [facility] OR;  Service: General;  Laterality: N/A;  left colectomy, mobilization of splenic flexure, ostomy.   COLOSTOMY  01/14/2012   COLOSTOMY TAKEDOWN N/A 02/24/2013   Procedure: Laparoscopic Assisted Colostomy Closure;  Surgeon: Adolph Pollack, MD;  Location: [facility];  Service: General;  Laterality: N/A;  Laparoscopic Assisted Colostomy Closure   FLEXIBLE SIGMOIDOSCOPY N/A 03/17/2014   Procedure: FLEXIBLE SIGMOIDOSCOPY;  Surgeon: Theda Belfast, MD;  Location: [facility];  Service: Endoscopy;  Laterality: N/A;   FLEXIBLE SIGMOIDOSCOPY N/A 04/07/2014   Procedure: FLEXIBLE SIGMOIDOSCOPY;  Surgeon: Theda Belfast, MD;  Location: [facility];  Service: Endoscopy;  Laterality: N/A;  no sedation   HERNIA REPAIR     HOT HEMOSTASIS N/A 03/17/2014   Procedure: HOT HEMOSTASIS (ARGON PLASMA COAGULATION/BICAP);  Surgeon: Theda Belfast, MD;  Location: [facility];  Service: Endoscopy;  Laterality: N/A;   HOT HEMOSTASIS N/A 04/07/2014   Procedure: HOT HEMOSTASIS (ARGON PLASMA COAGULATION/BICAP);  Surgeon: Theda Belfast, MD;  Location: [facility];  Service: Endoscopy;  Laterality: N/A;    FAMHx:  Family History  Problem Relation Age of Onset   Hypertension Mother    Alcoholism Mother  Obesity Mother    Alcoholism Father    Hyperlipidemia Father    Heart attack Father    Hypertension Father    Heart disease Father    Stomach cancer Sister    Pancreatic cancer Brother    Cancer Brother        pancreatic    SOCHx:   reports that she quit smoking about 38 years ago. Her smoking use included cigarettes. She started smoking about 48 years ago. She has a 5.5 pack-year smoking history. She has never used smokeless tobacco. She reports current alcohol use of about 1.0 standard drink of alcohol per week. She reports that she does not use drugs.  ALLERGIES:  Allergies  Allergen  Reactions   Lisinopril Swelling    Lip swelling   Simvastatin Other (See Comments)    Severe rhabdomyolysis.   Wellbutrin [Bupropion] Swelling    Facial Swelling    ROS: Pertinent items noted in HPI and remainder of comprehensive ROS otherwise negative.  HOME MEDS: Current Outpatient Medications on File Prior to Visit  Medication Sig Dispense Refill   amLODipine (NORVASC) 2.5 MG tablet Take 2.5 mg by mouth daily.     Evolocumab (REPATHA SURECLICK) 140 MG/ML SOAJ Inject 140 mg into the skin every 14 (fourteen) days. 6 mL 3   fluvastatin (LESCOL) 40 MG capsule Take 40 mg by mouth at bedtime. Take 2 capsules by mouth daily     hydrochlorothiazide (HYDRODIURIL) 25 MG tablet Take 1 tablet (25 mg total) by mouth daily. Takes 1 tab in the morning 30 tablet 0   metFORMIN (GLUCOPHAGE) 500 MG tablet Take 1 tablet (500 mg total) by mouth daily. 30 tablet 0   rivaroxaban (XARELTO) 2.5 MG TABS tablet Take 2 tablets (5 mg total) by mouth daily. 180 tablet 0   Vitamin D, Ergocalciferol, (DRISDOL) 1.25 MG (50000 UNIT) CAPS capsule Take 1 capsule (50,000 Units total) by mouth every 14 (fourteen) days. 2 capsule 0   XARELTO 2.5 MG TABS tablet Take 2 tablets by mouth once daily 180 tablet 0   No current facility-administered medications on file prior to visit.    LABS/IMAGING: No results found for this or any previous visit (from the past 48 hour(s)). No results found.  LIPID PANEL:    Component Value Date/Time   CHOL 234 (H) 11/27/2022 0906   TRIG 107 11/27/2022 0906   HDL 83 11/27/2022 0906   CHOLHDL 3.1 12/18/2021 1038   CHOLHDL 3.3 12/14/2016 0531   VLDL 18 12/14/2016 0531   LDLCALC 133 (H) 11/27/2022 0906    WEIGHTS: Wt Readings from Last 3 Encounters:  07/24/23 193 lb 12.8 oz (87.9 kg)  03/31/23 189 lb 3.2 oz (85.8 kg)  02/19/23 183 lb (83 kg)    VITALS: BP 136/84 (BP Location: Left Arm, Patient Position: Sitting, Cuff Size: Normal)   Pulse 91   Ht 5\' 5"  (1.651 m)   Wt 193 lb  12.8 oz (87.9 kg)   SpO2 99%   BMI 32.25 kg/m   EXAM: Deferred  EKG: Deferred  ASSESSMENT: Mixed dyslipidemia, goal LDL less than 70 Hypertension Strong family history of early onset heart disease History of thrombosis on Xarelto High intensity statin intolerant-currently on fluvastatin  PLAN: 1.   Ms. Bahner remains uncontrolled with regards to her lipids.  She is on a low potency statin but could not tolerate rosuvastatin or atorvastatin or other higher potency statins.  She remains well above a cholesterol target as she would be intermediate to high  risk based on family history, hypertension, age and her lipids.  I would advise considering a PCSK9 inhibitor.  Will also assess for LP(a).  She would be interested in genetic testing as well given the early onset heart disease and the fact that her cholesterol has not improved significantly on statins.  She understands that this may be covered by insurance but if not could result in a $299 charge.  Will plan blood work and follow-up in about 3 to 4 months on therapy.  Thanks again for the kind referral.  Chrystie Nose, MD, [facility]  [place]  [facility]  Medical Director of the Advanced Lipid Disorders &  Cardiovascular Risk Reduction Clinic Diplomate of the American Board of Clinical Lipidology Attending Cardiologist  Direct Dial: [phone]  Fax: [phone]  Website:  [email or web address]  07/24/2023, 11:25 AM

## 2023-07-24 NOTE — Patient Instructions (Signed)
Medication Instructions:  NO CHANGES  *If you need a refill on your cardiac medications before your next appointment, please call your pharmacy*   Lab Work: FASTING lab work to check cholesterol in 1 year   If you have labs (blood work) drawn today and your tests are completely normal, you will receive your results only by: MyChart Message (if you have MyChart) OR A paper copy in the mail If you have any lab test that is abnormal or we need to change your treatment, we will call you to review the results.   Testing/Procedures: Lower Extremity Venous Doppler on left leg   Follow-Up: At [facility], you and your health needs are our priority.  As part of our continuing mission to provide you with exceptional heart care, we have created designated Provider Care Teams.  These Care Teams include your primary Cardiologist (physician) and Advanced Practice Providers (APPs -  Physician Assistants and Nurse Practitioners) who all work together to provide you with the care you need, when you need it.  We recommend signing up for the patient portal called "MyChart".  Sign up information is provided on this After Visit Summary.  MyChart is used to connect with patients for Virtual Visits (Telemedicine).  Patients are able to view lab/test results, encounter notes, upcoming appointments, etc.  Non-urgent messages can be sent to your provider as well.   To learn more about what you can do with MyChart, go to [email or web address].    Your next appointment:    12 months with Dr. Rennis Golden

## 2023-08-04 ENCOUNTER — Encounter: Payer: Self-pay | Admitting: Family

## 2023-08-04 ENCOUNTER — Inpatient Hospital Stay: Payer: BC Managed Care – PPO | Attending: Family

## 2023-08-04 ENCOUNTER — Inpatient Hospital Stay: Payer: BC Managed Care – PPO | Admitting: Family

## 2023-08-04 VITALS — BP 136/77 | HR 98 | Temp 98.2°F | Resp 18 | Ht 65.0 in | Wt 191.5 lb

## 2023-08-04 DIAGNOSIS — Z79899 Other long term (current) drug therapy: Secondary | ICD-10-CM | POA: Diagnosis not present

## 2023-08-04 DIAGNOSIS — Z85038 Personal history of other malignant neoplasm of large intestine: Secondary | ICD-10-CM | POA: Diagnosis not present

## 2023-08-04 DIAGNOSIS — Z7901 Long term (current) use of anticoagulants: Secondary | ICD-10-CM | POA: Insufficient documentation

## 2023-08-04 DIAGNOSIS — Z86718 Personal history of other venous thrombosis and embolism: Secondary | ICD-10-CM | POA: Insufficient documentation

## 2023-08-04 DIAGNOSIS — D500 Iron deficiency anemia secondary to blood loss (chronic): Secondary | ICD-10-CM

## 2023-08-04 DIAGNOSIS — I82411 Acute embolism and thrombosis of right femoral vein: Secondary | ICD-10-CM | POA: Diagnosis not present

## 2023-08-04 DIAGNOSIS — C186 Malignant neoplasm of descending colon: Secondary | ICD-10-CM

## 2023-08-04 LAB — CBC WITH DIFFERENTIAL (CANCER CENTER ONLY)
Abs Immature Granulocytes: 0.01 10*3/uL (ref 0.00–0.07)
Basophils Absolute: 0 10*3/uL (ref 0.0–0.1)
Basophils Relative: 1 %
Eosinophils Absolute: 0.1 10*3/uL (ref 0.0–0.5)
Eosinophils Relative: 3 %
HCT: 38.8 % (ref 36.0–46.0)
Hemoglobin: 13.6 g/dL (ref 12.0–15.0)
Immature Granulocytes: 0 %
Lymphocytes Relative: 42 %
Lymphs Abs: 1.7 10*3/uL (ref 0.7–4.0)
MCH: 31.2 pg (ref 26.0–34.0)
MCHC: 35.1 g/dL (ref 30.0–36.0)
MCV: 89 fL (ref 80.0–100.0)
Monocytes Absolute: 0.3 10*3/uL (ref 0.1–1.0)
Monocytes Relative: 7 %
Neutro Abs: 1.9 10*3/uL (ref 1.7–7.7)
Neutrophils Relative %: 47 %
Platelet Count: 159 10*3/uL (ref 150–400)
RBC: 4.36 MIL/uL (ref 3.87–5.11)
RDW: 12.2 % (ref 11.5–15.5)
WBC Count: 4.1 10*3/uL (ref 4.0–10.5)
nRBC: 0 % (ref 0.0–0.2)

## 2023-08-04 LAB — LACTATE DEHYDROGENASE: LDH: 187 U/L (ref 98–192)

## 2023-08-04 LAB — FERRITIN: Ferritin: 94 ng/mL (ref 11–307)

## 2023-08-04 LAB — CMP (CANCER CENTER ONLY)
ALT: 22 U/L (ref 0–44)
AST: 22 U/L (ref 15–41)
Albumin: 4.5 g/dL (ref 3.5–5.0)
Alkaline Phosphatase: 69 U/L (ref 38–126)
Anion gap: 9 (ref 5–15)
BUN: 12 mg/dL (ref 8–23)
CO2: 32 mmol/L (ref 22–32)
Calcium: 9.3 mg/dL (ref 8.9–10.3)
Chloride: 102 mmol/L (ref 98–111)
Creatinine: 0.82 mg/dL (ref 0.44–1.00)
GFR, Estimated: >60 mL/min (ref >60)
Glucose, Bld: 149 mg/dL — ABNORMAL HIGH (ref 70–99)
Potassium: 3.9 mmol/L (ref 3.5–5.1)
Sodium: 143 mmol/L (ref 135–145)
Total Bilirubin: 0.8 mg/dL (ref 0.3–1.2)
Total Protein: 7.1 g/dL (ref 6.5–8.1)

## 2023-08-04 LAB — IRON AND IRON BINDING CAPACITY (CC-WL,HP ONLY)
Iron: 100 ug/dL (ref 28–170)
Saturation Ratios: 26 % (ref 10.4–31.8)
TIBC: 392 ug/dL (ref 250–450)
UIBC: 292 ug/dL (ref 148–442)

## 2023-08-04 LAB — CEA (ACCESS): CEA (CHCC): 6.13 ng/mL — ABNORMAL HIGH (ref 0.00–5.00)

## 2023-08-04 NOTE — Progress Notes (Signed)
Hematology and Oncology Follow Up Visit  Mallory Morales [id number] 1956/01/13 67 y.o. 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 She is doing quite well and has no complaints at this time.  CEA in February was 5.52. Today's result is pending.  No issues with bleeding. No abnormal bruising or petechiae.  No issues with recurrent thrombotic event on maintenance Xarelto.  No fever, chills, n/v, cough, rash, dizziness, SOB, chest pain, palpitations, abdominal pain or changes in bowel or bladder habits.  No swelling, tenderness, numbness or tingling in her extremities.  No falls or syncope.  Appetite and hydration are good. Weight is stable at 191 lbs.   ECOG Performance Status: 0 - Asymptomatic  Medications:  Allergies as of 08/04/2023       Reactions   Lisinopril Swelling   Lip swelling   Simvastatin Other (See Comments)   Severe rhabdomyolysis.   Wellbutrin [bupropion] Swelling   Facial Swelling        Medication List        Accurate as of August 04, 2023  8:41 AM. If you have any questions, ask your nurse or doctor.          amLODipine 2.5 MG tablet Commonly known as: NORVASC Take 2.5 mg by mouth daily.   fluvastatin 40 MG capsule Commonly known as: LESCOL Take 40 mg by mouth at bedtime. Take 2 capsules by mouth daily   hydrochlorothiazide 25 MG tablet Commonly known as: HYDRODIURIL Take 1 tablet (25 mg total) by mouth daily. Takes 1 tab in the morning   metFORMIN 500 MG tablet Commonly known as: GLUCOPHAGE Take 1 tablet (500 mg total) by mouth daily.   Repatha SureClick 140 MG/ML Soaj Generic drug: Evolocumab Inject  140 mg into the skin every 14 (fourteen) days.   Vitamin D (Ergocalciferol) 1.25 MG (50000 UNIT) Caps capsule Commonly known as: DRISDOL Take 1 capsule (50,000 Units total) by mouth every 14 (fourteen) days.   Xarelto 2.5 MG Tabs tablet Generic drug: rivaroxaban Take 2 tablets by mouth once daily   Xarelto 2.5 MG Tabs tablet Generic drug: rivaroxaban Take 2 tablets (5 mg total) by mouth daily.        Allergies:  Allergies  Allergen Reactions   Lisinopril Swelling    Lip swelling   Simvastatin Other (See Comments)    Severe rhabdomyolysis.   Wellbutrin [Bupropion] Swelling    Facial Swelling    Past Medical History, Surgical history, Social history, and Family History were reviewed and updated.  Review of Systems: All other 10 point review of systems is negative.   Physical Exam:  height is 5\' 5"  (1.651 m) and weight is 191 lb 8 oz (86.9 kg). Her oral temperature is 98.2 F (36.8 C). Her blood pressure is 136/77 and her pulse is 98. Her respiration is 18 and oxygen saturation is 100%.   Wt Readings from Last 3 Encounters:  08/04/23 191 lb 8 oz (86.9 kg)  07/24/23 193 lb 12.8 oz (87.9 kg)  03/31/23 189 lb 3.2 oz (85.8 kg)    Ocular: Sclerae unicteric, pupils equal, round and reactive to light  Ear-nose-throat: Oropharynx clear, dentition fair Lymphatic: No cervical or supraclavicular adenopathy Lungs no rales or rhonchi, good excursion bilaterally Heart regular rate and rhythm, no murmur appreciated Abd soft, nontender, positive bowel sounds MSK no focal spinal tenderness, no joint edema Neuro: non-focal, well-oriented, appropriate affect Breasts: Deferred   Lab Results  Component Value Date   WBC 5.6 02/03/2023   HGB 13.6 02/03/2023   HCT 38.7 02/03/2023   MCV 89.0 02/03/2023   PLT 160 02/03/2023   Lab Results  Component Value Date   FERRITIN 127 02/03/2023   IRON 66 02/03/2023   TIBC 388 02/03/2023   UIBC 322 02/03/2023   IRONPCTSAT 17 02/03/2023    Lab Results  Component Value Date   RETICCTPCT 1.2 03/01/2015   RBC 4.35 02/03/2023   RETICCTABS 52.1 03/01/2015   No results found for: "KPAFRELGTCHN", "LAMBDASER", "KAPLAMBRATIO" No results found for: "IGGSERUM", "IGA", "IGMSERUM" No results found for: "TOTALPROTELP", "ALBUMINELP", "A1GS", "A2GS", "BETS", "BETA2SER", "GAMS", "MSPIKE", "SPEI"   Chemistry      Component Value Date/Time   NA 141 07/10/2023 1500   NA 146 (H) 10/16/2017 0804   NA 141 03/13/2017 0807   K 3.9 07/10/2023 1500   K 3.8 10/16/2017 0804   K 3.9 03/13/2017 0807   CL 99 07/10/2023 1500   CL 106 10/16/2017 0804   CO2 25 07/10/2023 1500   CO2 29 10/16/2017 0804   CO2 27 03/13/2017 0807   BUN 17 07/10/2023 1500   BUN 10 10/16/2017 0804   BUN 12.4 03/13/2017 0807   CREATININE 0.67 07/10/2023 1500   CREATININE 0.80 02/03/2023 0811   CREATININE 1.0 10/16/2017 0804   CREATININE 0.8 03/13/2017 0807      Component Value Date/Time   CALCIUM 10.1 07/10/2023 1500   CALCIUM 9.3 10/16/2017 0804   CALCIUM 9.6 03/13/2017 0807   ALKPHOS 93 07/10/2023 1500   ALKPHOS 72 10/16/2017 0804   ALKPHOS 70 03/13/2017 0807   AST 20 07/10/2023 1500   AST 14 (L) 02/03/2023 0811   AST 16 03/13/2017 0807   ALT 22 07/10/2023 1500   ALT 13 02/03/2023 0811   ALT 28 10/16/2017 0804   ALT 18 03/13/2017 0807   BILITOT 0.9 07/10/2023 1500   BILITOT 0.7 02/03/2023 0811   BILITOT 0.93 03/13/2017 0807       Impression and Plan: Mallory Morales is a pleasant 67 yo African American female with history of stage II adenocarcinoma of the sigmoid colon, Oncotype score was 20. She had 36 lymph nodes that were negative. She had her surgery in January of 2013 and did not require adjuvant treatment.  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  Hyper coag panel was unremarkable.  She will continue her same regimen with maintenance Xarelto 5 mg PO daily.  CEA pending.  Iron studies are pending. We will replace if  needed.  Follow-up in 1 year, patient preference due to cost.   Eileen Stanford, NP 8/13/20248:41 AM

## 2023-08-05 ENCOUNTER — Other Ambulatory Visit: Payer: Self-pay | Admitting: Family

## 2023-08-05 ENCOUNTER — Telehealth: Payer: Self-pay | Admitting: Family

## 2023-08-05 DIAGNOSIS — C186 Malignant neoplasm of descending colon: Secondary | ICD-10-CM

## 2023-08-05 DIAGNOSIS — R970 Elevated carcinoembryonic antigen [CEA]: Secondary | ICD-10-CM

## 2023-08-05 NOTE — Telephone Encounter (Signed)
Patient updated on slightly elevated CEA and plan for CT scant to evaluate. She is in agreement with the plan. No questions or concerns at this time. Patient appreciative of call.

## 2023-08-07 ENCOUNTER — Ambulatory Visit (HOSPITAL_COMMUNITY)
Admission: RE | Admit: 2023-08-07 | Discharge: 2023-08-07 | Disposition: A | Discharge status: Home / Self Care | Payer: BC Managed Care – PPO | Source: Ambulatory Visit | Attending: Cardiology | Admitting: Cardiology

## 2023-08-07 DIAGNOSIS — Z86718 Personal history of other venous thrombosis and embolism: Secondary | ICD-10-CM | POA: Diagnosis not present

## 2023-08-11 ENCOUNTER — Ambulatory Visit (HOSPITAL_BASED_OUTPATIENT_CLINIC_OR_DEPARTMENT_OTHER)
Admission: RE | Admit: 2023-08-11 | Discharge: 2023-08-11 | Disposition: A | Discharge status: Home / Self Care | Payer: BC Managed Care – PPO | Source: Ambulatory Visit | Attending: Family | Admitting: Family

## 2023-08-11 DIAGNOSIS — C186 Malignant neoplasm of descending colon: Secondary | ICD-10-CM | POA: Insufficient documentation

## 2023-08-11 DIAGNOSIS — Z85038 Personal history of other malignant neoplasm of large intestine: Secondary | ICD-10-CM | POA: Diagnosis not present

## 2023-08-11 DIAGNOSIS — R970 Elevated carcinoembryonic antigen [CEA]: Secondary | ICD-10-CM | POA: Diagnosis not present

## 2023-08-11 DIAGNOSIS — I700 Atherosclerosis of aorta: Secondary | ICD-10-CM | POA: Diagnosis not present

## 2023-08-11 DIAGNOSIS — N3289 Other specified disorders of bladder: Secondary | ICD-10-CM | POA: Diagnosis not present

## 2023-08-11 DIAGNOSIS — E041 Nontoxic single thyroid nodule: Secondary | ICD-10-CM | POA: Diagnosis not present

## 2023-08-11 MED ORDER — IOHEXOL 300 MG/ML  SOLN
100.0000 mL | Freq: Once | INTRAMUSCULAR | Status: AC | PRN
  Administered 2023-08-11: 100 mL via INTRAVENOUS

## 2023-08-12 ENCOUNTER — Encounter: Payer: Self-pay | Admitting: Internal Medicine

## 2023-08-13 ENCOUNTER — Encounter: Payer: Self-pay | Admitting: Family

## 2023-08-13 ENCOUNTER — Other Ambulatory Visit: Payer: Self-pay

## 2023-08-13 DIAGNOSIS — D500 Iron deficiency anemia secondary to blood loss (chronic): Secondary | ICD-10-CM

## 2023-08-13 DIAGNOSIS — C187 Malignant neoplasm of sigmoid colon: Secondary | ICD-10-CM

## 2023-08-27 ENCOUNTER — Institutional Professional Consult (permissible substitution) (HOSPITAL_BASED_OUTPATIENT_CLINIC_OR_DEPARTMENT_OTHER): Payer: 59 | Admitting: Internal Medicine

## 2023-08-28 DIAGNOSIS — Z1231 Encounter for screening mammogram for malignant neoplasm of breast: Secondary | ICD-10-CM | POA: Diagnosis not present

## 2023-09-10 DIAGNOSIS — Z0000 Encounter for general adult medical examination without abnormal findings: Secondary | ICD-10-CM | POA: Diagnosis not present

## 2023-09-10 DIAGNOSIS — R7303 Prediabetes: Secondary | ICD-10-CM | POA: Diagnosis not present

## 2023-09-10 DIAGNOSIS — E7800 Pure hypercholesterolemia, unspecified: Secondary | ICD-10-CM | POA: Diagnosis not present

## 2023-09-10 DIAGNOSIS — I10 Essential (primary) hypertension: Secondary | ICD-10-CM | POA: Diagnosis not present

## 2023-09-10 DIAGNOSIS — E559 Vitamin D deficiency, unspecified: Secondary | ICD-10-CM | POA: Diagnosis not present

## 2023-09-16 DIAGNOSIS — L280 Lichen simplex chronicus: Secondary | ICD-10-CM | POA: Diagnosis not present

## 2023-09-18 ENCOUNTER — Other Ambulatory Visit (HOSPITAL_BASED_OUTPATIENT_CLINIC_OR_DEPARTMENT_OTHER): Payer: Self-pay

## 2023-10-28 DIAGNOSIS — L280 Lichen simplex chronicus: Secondary | ICD-10-CM | POA: Diagnosis not present

## 2023-11-21 ENCOUNTER — Encounter: Payer: Self-pay | Admitting: Family

## 2023-11-21 ENCOUNTER — Other Ambulatory Visit: Payer: Self-pay | Admitting: Hematology & Oncology

## 2023-11-21 DIAGNOSIS — D501 Sideropenic dysphagia: Secondary | ICD-10-CM

## 2023-11-21 DIAGNOSIS — C186 Malignant neoplasm of descending colon: Secondary | ICD-10-CM

## 2023-11-21 DIAGNOSIS — I82413 Acute embolism and thrombosis of femoral vein, bilateral: Secondary | ICD-10-CM

## 2024-01-29 DIAGNOSIS — H524 Presbyopia: Secondary | ICD-10-CM | POA: Diagnosis not present

## 2024-01-29 DIAGNOSIS — H5203 Hypermetropia, bilateral: Secondary | ICD-10-CM | POA: Diagnosis not present

## 2024-01-29 DIAGNOSIS — H25813 Combined forms of age-related cataract, bilateral: Secondary | ICD-10-CM | POA: Diagnosis not present

## 2024-01-29 DIAGNOSIS — R7309 Other abnormal glucose: Secondary | ICD-10-CM | POA: Diagnosis not present

## 2024-02-03 ENCOUNTER — Inpatient Hospital Stay: Payer: BC Managed Care – PPO | Attending: Hematology & Oncology

## 2024-02-03 DIAGNOSIS — Z85038 Personal history of other malignant neoplasm of large intestine: Secondary | ICD-10-CM | POA: Diagnosis not present

## 2024-02-03 DIAGNOSIS — Z86718 Personal history of other venous thrombosis and embolism: Secondary | ICD-10-CM | POA: Diagnosis not present

## 2024-02-03 DIAGNOSIS — Z7901 Long term (current) use of anticoagulants: Secondary | ICD-10-CM | POA: Diagnosis not present

## 2024-02-03 DIAGNOSIS — Z87891 Personal history of nicotine dependence: Secondary | ICD-10-CM | POA: Diagnosis not present

## 2024-02-03 DIAGNOSIS — D500 Iron deficiency anemia secondary to blood loss (chronic): Secondary | ICD-10-CM

## 2024-02-03 DIAGNOSIS — C187 Malignant neoplasm of sigmoid colon: Secondary | ICD-10-CM

## 2024-02-03 LAB — CBC WITH DIFFERENTIAL (CANCER CENTER ONLY)
Abs Immature Granulocytes: 0.01 10*3/uL (ref 0.00–0.07)
Basophils Absolute: 0 10*3/uL (ref 0.0–0.1)
Basophils Relative: 1 %
Eosinophils Absolute: 0.1 10*3/uL (ref 0.0–0.5)
Eosinophils Relative: 3 %
HCT: 37 % (ref 36.0–46.0)
Hemoglobin: 13.3 g/dL (ref 12.0–15.0)
Immature Granulocytes: 0 %
Lymphocytes Relative: 41 %
Lymphs Abs: 1.7 10*3/uL (ref 0.7–4.0)
MCH: 31.4 pg (ref 26.0–34.0)
MCHC: 35.9 g/dL (ref 30.0–36.0)
MCV: 87.3 fL (ref 80.0–100.0)
Monocytes Absolute: 0.3 10*3/uL (ref 0.1–1.0)
Monocytes Relative: 6 %
Neutro Abs: 2 10*3/uL (ref 1.7–7.7)
Neutrophils Relative %: 49 %
Platelet Count: 149 10*3/uL — ABNORMAL LOW (ref 150–400)
RBC: 4.24 MIL/uL (ref 3.87–5.11)
RDW: 11.7 % (ref 11.5–15.5)
WBC Count: 4.1 10*3/uL (ref 4.0–10.5)
nRBC: 0 % (ref 0.0–0.2)

## 2024-02-03 LAB — BASIC METABOLIC PANEL - CANCER CENTER ONLY
Anion gap: 8 (ref 5–15)
BUN: 12 mg/dL (ref 8–23)
CO2: 29 mmol/L (ref 22–32)
Calcium: 9.9 mg/dL (ref 8.9–10.3)
Chloride: 105 mmol/L (ref 98–111)
Creatinine: 0.74 mg/dL (ref 0.44–1.00)
GFR, Estimated: >60 mL/min (ref >60)
Glucose, Bld: 110 mg/dL — ABNORMAL HIGH (ref 70–99)
Potassium: 3.8 mmol/L (ref 3.5–5.1)
Sodium: 142 mmol/L (ref 135–145)

## 2024-02-03 LAB — CEA (ACCESS): CEA (CHCC): 5.32 ng/mL — ABNORMAL HIGH (ref 0.00–5.00)

## 2024-02-03 LAB — FERRITIN: Ferritin: 143 ng/mL (ref 11–307)

## 2024-02-03 LAB — IRON AND IRON BINDING CAPACITY (CC-WL,HP ONLY)
Iron: 149 ug/dL (ref 28–170)
Saturation Ratios: 39 % — ABNORMAL HIGH (ref 10.4–31.8)
TIBC: 379 ug/dL (ref 250–450)
UIBC: 230 ug/dL (ref 148–442)

## 2024-02-03 LAB — LACTATE DEHYDROGENASE: LDH: 178 U/L (ref 98–192)

## 2024-02-17 ENCOUNTER — Telehealth: Payer: Self-pay | Admitting: *Deleted

## 2024-02-17 DIAGNOSIS — Z85038 Personal history of other malignant neoplasm of large intestine: Secondary | ICD-10-CM | POA: Diagnosis not present

## 2024-02-17 NOTE — Telephone Encounter (Signed)
   Pre-operative Risk Assessment    Patient Name: Mallory Morales  DOB: 1956-09-05 MRN: [id number]   Date of last office visit: 07/24/23 DR. HILTY Date of next office visit: NONE   Request for Surgical Clearance    Procedure:   COLONOSCOPY  Date of Surgery:  Clearance 03/15/24                                Surgeon:  DR. HUNG Surgeon's Group or Practice Name:  [facility] Phone number:  [phone] Fax number:  [phone]   Type of Clearance Requested:   - Medical  - Pharmacy:  Hold Rivaroxaban (Xarelto)     Type of Anesthesia:   PROPOFOL   Additional requests/questions:    Elpidio Anis   02/17/2024, 12:20 PM

## 2024-02-17 NOTE — Telephone Encounter (Signed)
   Name: Mallory Morales  DOB: 1956/07/20  MRN: [id number]  Primary Cardiologist: None   Preoperative team, please contact this patient and set up a phone call appointment for further preoperative risk assessment. Please obtain consent and complete medication review. Thank you for your help.  I confirm that guidance regarding antiplatelet and oral anticoagulation therapy has been completed and, if necessary, noted below.  Patient's Xarelto is not managed by cardiology.  Recommendations for holding Xarelto prior to procedure should come from managing provider (hematology).  I also confirmed the patient resides in the state of [place]. As per [facility] Medical Board telemedicine laws, the patient must reside in the state in which the provider is licensed.   Joylene Grapes, NP 02/17/2024, 12:27 PM [facility]

## 2024-02-18 ENCOUNTER — Other Ambulatory Visit: Payer: Self-pay | Admitting: Hematology & Oncology

## 2024-02-18 ENCOUNTER — Telehealth: Payer: Self-pay | Admitting: *Deleted

## 2024-02-18 DIAGNOSIS — I82413 Acute embolism and thrombosis of femoral vein, bilateral: Secondary | ICD-10-CM

## 2024-02-18 DIAGNOSIS — C186 Malignant neoplasm of descending colon: Secondary | ICD-10-CM

## 2024-02-18 DIAGNOSIS — D501 Sideropenic dysphagia: Secondary | ICD-10-CM

## 2024-02-18 NOTE — Telephone Encounter (Signed)
 Pt has been scheduled a tele visit, 03/03/24 9:20.  Consent on file / medications reconciled.

## 2024-02-18 NOTE — Telephone Encounter (Signed)
 Pt has been scheduled a tele visit, 03/03/24 9:20.  Consent on file / medications reconciled.     Patient Consent for Virtual Visit        Mallory Morales has provided verbal consent on 02/18/2024 for a virtual visit (video or telephone).   CONSENT FOR VIRTUAL VISIT FOR:  Mallory Morales  By participating in this virtual visit I agree to the following:  I hereby voluntarily request, consent and authorize [facility] and its employed or contracted physicians, physician assistants, nurse practitioners or other licensed health care professionals (the Practitioner), to provide me with telemedicine health care services (the "Services") as deemed necessary by the treating Practitioner. I acknowledge and consent to receive the Services by the Practitioner via telemedicine. I understand that the telemedicine visit will involve communicating with the Practitioner through live audiovisual communication technology and the disclosure of certain medical information by electronic transmission. I acknowledge that I have been given the opportunity to request an in-person assessment or other available alternative prior to the telemedicine visit and am voluntarily participating in the telemedicine visit.  I understand that I have the right to withhold or withdraw my consent to the use of telemedicine in the course of my care at any time, without affecting my right to future care or treatment, and that the Practitioner or I may terminate the telemedicine visit at any time. I understand that I have the right to inspect all information obtained and/or recorded in the course of the telemedicine visit and may receive copies of available information for a reasonable fee.  I understand that some of the potential risks of receiving the Services via telemedicine include:  Delay or interruption in medical evaluation due to technological equipment failure or disruption; Information transmitted may not be sufficient  (e.g. poor resolution of images) to allow for appropriate medical decision making by the Practitioner; and/or  In rare instances, security protocols could fail, causing a breach of personal health information.  Furthermore, I acknowledge that it is my responsibility to provide information about my medical history, conditions and care that is complete and accurate to the best of my ability. I acknowledge that Practitioner's advice, recommendations, and/or decision may be based on factors not within their control, such as incomplete or inaccurate data provided by me or distortions of diagnostic images or specimens that may result from electronic transmissions. I understand that the practice of medicine is not an exact science and that Practitioner makes no warranties or guarantees regarding treatment outcomes. I acknowledge that a copy of this consent can be made available to me via my patient portal [facility] MyChart), or I can request a printed copy by calling the office of [facility].    I understand that my insurance will be billed for this visit.   I have read or had this consent read to me. I understand the contents of this consent, which adequately explains the benefits and risks of the Services being provided via telemedicine.  I have been provided ample opportunity to ask questions regarding this consent and the Services and have had my questions answered to my satisfaction. I give my informed consent for the services to be provided through the use of telemedicine in my medical care

## 2024-03-03 ENCOUNTER — Ambulatory Visit: Payer: BC Managed Care – PPO | Attending: Cardiology

## 2024-03-03 DIAGNOSIS — Z01810 Encounter for preprocedural cardiovascular examination: Secondary | ICD-10-CM | POA: Diagnosis not present

## 2024-03-03 NOTE — Progress Notes (Signed)
 Virtual Visit via Telephone Note   Because of Mallory Morales co-morbid illnesses, she is at least at moderate risk for complications without adequate follow up.  This format is felt to be most appropriate for this patient at this time.  Due to technical limitations with video connection Web designer), today's appointment will be conducted as an audio only telehealth visit, and [name] verbally agreed to proceed in this manner.   All issues noted in this document were discussed and addressed.  No physical exam could be performed with this format.  Evaluation Performed:  Preoperative cardiovascular risk assessment _____________   Date:  03/03/2024   Patient ID:  Mallory Morales, DOB 1956/01/23, MRN [id number] Patient Location:  Home Provider location:   Office  Primary Care Provider:  Blair Heys, MD (Inactive) Primary Cardiologist:  None  Chief Complaint / Patient Profile   68 y.o. y/o female with a h/o hypertension, rhabdomyolysis, hyperlipidemia who is pending colonoscopy and presents today for telephonic preoperative cardiovascular risk assessment.  History of Present Illness    Mallory Morales is a 68 y.o. female who presents via audio/video conferencing for a telehealth visit today.  Pt was last seen in cardiology clinic on 07/24/23 by Dr. Rennis Golden.  At that time Mallory Morales was doing well .  The patient is now pending procedure as outlined above. Since her last visit, she continues to be stable from a cardiac standpoint.  Today she denies chest pain, shortness of breath, lower extremity edema, fatigue, palpitations, melena, hematuria, hemoptysis, diaphoresis, weakness, presyncope, syncope, orthopnea, and PND.    Past Medical History    Past Medical History:  Diagnosis Date   Anemia, iron deficiency 01/27/2012   Back pain    Cancer (HCC)    Clotting disorder (HCC)    Colon cancer (HCC)    T3N0M0, emergency surgery/with colostomy done-no further tx.    Complication of anesthesia    "hard time getting breath" post op   Constipation    DVT (deep venous thrombosis) (HCC)    Edema of both lower extremities    Fatigue    History of stomach ulcers    Hyperlipemia    Hypertension    Joint pain    Lactose intolerance    Prediabetes    Pulmonary embolism (HCC)    taking injections -last done 1'14(Arixtra)   SOB (shortness of breath)    Past Surgical History:  Procedure Laterality Date   ABDOMINAL HYSTERECTOMY     COLON RESECTION  01/14/2012   Procedure: COLON RESECTION;  Surgeon: Adolph Pollack, MD;  Location: [facility] OR;  Service: General;  Laterality: N/A;  left colectomy, mobilization of splenic flexure, ostomy.   COLOSTOMY  01/14/2012   COLOSTOMY TAKEDOWN N/A 02/24/2013   Procedure: Laparoscopic Assisted Colostomy Closure;  Surgeon: Adolph Pollack, MD;  Location: [facility] ORS;  Service: General;  Laterality: N/A;  Laparoscopic Assisted Colostomy Closure   FLEXIBLE SIGMOIDOSCOPY N/A 03/17/2014   Procedure: FLEXIBLE SIGMOIDOSCOPY;  Surgeon: Theda Belfast, MD;  Location: [facility];  Service: Endoscopy;  Laterality: N/A;   FLEXIBLE SIGMOIDOSCOPY N/A 04/07/2014   Procedure: FLEXIBLE SIGMOIDOSCOPY;  Surgeon: Theda Belfast, MD;  Location: [facility];  Service: Endoscopy;  Laterality: N/A;  no sedation   HERNIA REPAIR     HOT HEMOSTASIS N/A 03/17/2014   Procedure: HOT HEMOSTASIS (ARGON PLASMA COAGULATION/BICAP);  Surgeon: Theda Belfast, MD;  Location: [facility];  Service: Endoscopy;  Laterality: N/A;   HOT HEMOSTASIS N/A 04/07/2014  Procedure: HOT HEMOSTASIS (ARGON PLASMA COAGULATION/BICAP);  Surgeon: Theda Belfast, MD;  Location: [facility];  Service: Endoscopy;  Laterality: N/A;    Allergies  Allergies  Allergen Reactions   Lisinopril Swelling    Lip swelling   Simvastatin Other (See Comments)    Severe rhabdomyolysis.   Wellbutrin [Bupropion] Swelling    Facial Swelling    Home Medications    Prior to Admission medications    Medication Sig Start Date End Date Taking? Authorizing Provider  amLODipine (NORVASC) 2.5 MG tablet Take 2.5 mg by mouth daily. 08/27/22   [provider]  Evolocumab (REPATHA SURECLICK) 140 MG/ML SOAJ Inject 140 mg into the skin every 14 (fourteen) days. 05/12/23   Hilty, Lisette Abu, MD  fluvastatin (LESCOL) 40 MG capsule Take 40 mg by mouth at bedtime. Take 2 capsules by mouth daily    [provider]  hydrochlorothiazide (HYDRODIURIL) 25 MG tablet Take 1 tablet (25 mg total) by mouth daily. Takes 1 tab in the morning 02/19/23   Quillian Quince D, MD  metFORMIN (GLUCOPHAGE) 500 MG tablet Take 1 tablet (500 mg total) by mouth daily. 02/19/23   Quillian Quince D, MD  Vitamin D, Ergocalciferol, (DRISDOL) 1.25 MG (50000 UNIT) CAPS capsule Take 1 capsule (50,000 Units total) by mouth every 14 (fourteen) days. 02/19/23   Wilder Glade, MD  XARELTO 2.5 MG TABS tablet Take 2 tablets by mouth once daily 06/01/23   Josph Macho, MD    Physical Exam    Vital Signs:  Nelle Don does not have vital signs available for review today.  Given telephonic nature of communication, physical exam is limited. AAOx3. NAD. Normal affect.  Speech and respirations are unlabored.  Accessory Clinical Findings    None  Assessment & Plan    1.  Preoperative Cardiovascular Risk Assessment:Procedure:   COLONOSCOPY   Date of Surgery:  Clearance 03/15/24                                  Surgeon:  DR. HUNG Surgeon's Group or Practice Name:  [facility] Phone number:  [phone] Fax number:  [phone]      Primary Cardiologist: Dr. Rennis Golden  Chart reviewed as part of pre-operative protocol coverage. Given past medical history and time since last visit, based on ACC/AHA guidelines, MRY LAMIA would be at acceptable risk for the planned procedure without further cardiovascular testing.   Patient was advised that if she develops new symptoms prior to surgery to contact  our office to arrange a follow-up appointment.  He verbalized understanding.  Her Xarelto was not prescribed by a cardiology provider.  Recommendations for holding Xarelto will need to come from managing provider.  I will route this recommendation to the requesting party via Epic fax function and remove from pre-op pool.     Time:   Today, I have spent 6 minutes with the patient with telehealth technology discussing medical history, symptoms, and management plan.     Ronney Asters, NP  03/03/2024, 7:36 AM

## 2024-03-09 ENCOUNTER — Encounter: Payer: Self-pay | Admitting: Family

## 2024-03-09 DIAGNOSIS — E559 Vitamin D deficiency, unspecified: Secondary | ICD-10-CM | POA: Diagnosis not present

## 2024-03-09 DIAGNOSIS — E782 Mixed hyperlipidemia: Secondary | ICD-10-CM | POA: Diagnosis not present

## 2024-03-09 DIAGNOSIS — Z7984 Long term (current) use of oral hypoglycemic drugs: Secondary | ICD-10-CM | POA: Diagnosis not present

## 2024-03-09 DIAGNOSIS — R7303 Prediabetes: Secondary | ICD-10-CM | POA: Diagnosis not present

## 2024-03-09 DIAGNOSIS — I10 Essential (primary) hypertension: Secondary | ICD-10-CM | POA: Diagnosis not present

## 2024-03-22 ENCOUNTER — Other Ambulatory Visit: Payer: Self-pay | Admitting: Internal Medicine

## 2024-03-22 DIAGNOSIS — E7841 Elevated Lipoprotein(a): Secondary | ICD-10-CM

## 2024-03-22 DIAGNOSIS — Z8249 Family history of ischemic heart disease and other diseases of the circulatory system: Secondary | ICD-10-CM

## 2024-03-22 DIAGNOSIS — Z980 Intestinal bypass and anastomosis status: Secondary | ICD-10-CM | POA: Diagnosis not present

## 2024-03-22 DIAGNOSIS — Z85038 Personal history of other malignant neoplasm of large intestine: Secondary | ICD-10-CM | POA: Diagnosis not present

## 2024-03-22 DIAGNOSIS — Z1211 Encounter for screening for malignant neoplasm of colon: Secondary | ICD-10-CM | POA: Diagnosis not present

## 2024-03-22 DIAGNOSIS — E785 Hyperlipidemia, unspecified: Secondary | ICD-10-CM

## 2024-03-28 ENCOUNTER — Other Ambulatory Visit: Payer: Self-pay | Admitting: Pharmacist

## 2024-03-28 DIAGNOSIS — Z8249 Family history of ischemic heart disease and other diseases of the circulatory system: Secondary | ICD-10-CM

## 2024-03-28 DIAGNOSIS — E7841 Elevated Lipoprotein(a): Secondary | ICD-10-CM

## 2024-03-28 DIAGNOSIS — E785 Hyperlipidemia, unspecified: Secondary | ICD-10-CM

## 2024-03-28 MED ORDER — REPATHA SURECLICK 140 MG/ML ~~LOC~~ SOAJ
140.0000 mg | SUBCUTANEOUS | 2 refills | Status: AC
Start: 2024-03-28 — End: ?

## 2024-03-29 ENCOUNTER — Encounter: Payer: Self-pay | Admitting: Hematology & Oncology

## 2024-04-01 ENCOUNTER — Ambulatory Visit: Admitting: Podiatry

## 2024-04-01 ENCOUNTER — Ambulatory Visit (INDEPENDENT_AMBULATORY_CARE_PROVIDER_SITE_OTHER)

## 2024-04-01 ENCOUNTER — Encounter: Payer: Self-pay | Admitting: Podiatry

## 2024-04-01 DIAGNOSIS — M25572 Pain in left ankle and joints of left foot: Secondary | ICD-10-CM | POA: Diagnosis not present

## 2024-04-01 DIAGNOSIS — L309 Dermatitis, unspecified: Secondary | ICD-10-CM

## 2024-04-01 DIAGNOSIS — M779 Enthesopathy, unspecified: Secondary | ICD-10-CM

## 2024-04-01 DIAGNOSIS — M21612 Bunion of left foot: Secondary | ICD-10-CM

## 2024-04-01 MED ORDER — TRIAMCINOLONE ACETONIDE 0.1 % EX CREA: 1.0000 | TOPICAL_CREAM | Freq: Two times a day (BID) | CUTANEOUS | 0 refills | Status: AC

## 2024-04-01 NOTE — Progress Notes (Signed)
 Chief Complaint  Patient presents with   Foot Pain    "It's the same foot, the left foot.  It swells and it burns." N - foot swells and burns L - left D - August 2024 since I started Repatha Injections O - suddenly, about the same C - burns, swells, discoloration A - none T - [facility] checked for Leaky Vein, went to Dermatologist, Had a circulation test    HPI: 68 y.o. female presents today with concerns for left foot and ankle swelling, burning, pain sensations. She reports noticing darkening of the skin inside of her ankle, dorsal lateral part of her foot.  She attributes this to recently starting Repatha injections.  She states that she takes the medication every 2 weeks, notices onset of the symptoms and then notices them improved after about a week after the injection. Does also have history of prediabetes, prior DVT, PE.  She is currently using a topical steroid for another lesion to the dorsal lateral part of the foot from dermatologist which she states is a ringworm. She previously went to an urgent care for this and was treated as cellulitis at the time.  Past Medical History:  Diagnosis Date   Anemia, iron deficiency 01/27/2012   Back pain    Cancer (HCC)    Clotting disorder (HCC)    Colon cancer (HCC)    T3N0M0, emergency surgery/with colostomy done-no further tx.   Complication of anesthesia    "hard time getting breath" post op   Constipation    DVT (deep venous thrombosis) (HCC)    Edema of both lower extremities    Fatigue    History of stomach ulcers    Hyperlipemia    Hypertension    Joint pain    Lactose intolerance    Prediabetes    Pulmonary embolism (HCC)    taking injections -last done 1'14(Arixtra)   SOB (shortness of breath)     Past Surgical History:  Procedure Laterality Date   ABDOMINAL HYSTERECTOMY     COLON RESECTION  01/14/2012   Procedure: COLON RESECTION;  Surgeon: Harlee Lichtenstein, MD;  Location: [facility] OR;  Service: General;   Laterality: N/A;  left colectomy, mobilization of splenic flexure, ostomy.   COLOSTOMY  01/14/2012   COLOSTOMY TAKEDOWN N/A 02/24/2013   Procedure: Laparoscopic Assisted Colostomy Closure;  Surgeon: Harlee Lichtenstein, MD;  Location: [facility];  Service: General;  Laterality: N/A;  Laparoscopic Assisted Colostomy Closure   FLEXIBLE SIGMOIDOSCOPY N/A 03/17/2014   Procedure: FLEXIBLE SIGMOIDOSCOPY;  Surgeon: Almeda Aris, MD;  Location: [facility];  Service: Endoscopy;  Laterality: N/A;   FLEXIBLE SIGMOIDOSCOPY N/A 04/07/2014   Procedure: FLEXIBLE SIGMOIDOSCOPY;  Surgeon: Almeda Aris, MD;  Location: [facility];  Service: Endoscopy;  Laterality: N/A;  no sedation   HERNIA REPAIR     HOT HEMOSTASIS N/A 03/17/2014   Procedure: HOT HEMOSTASIS (ARGON PLASMA COAGULATION/BICAP);  Surgeon: Almeda Aris, MD;  Location: [facility];  Service: Endoscopy;  Laterality: N/A;   HOT HEMOSTASIS N/A 04/07/2014   Procedure: HOT HEMOSTASIS (ARGON PLASMA COAGULATION/BICAP);  Surgeon: Almeda Aris, MD;  Location: [facility];  Service: Endoscopy;  Laterality: N/A;    Allergies  Allergen Reactions   Lisinopril Swelling    Lip swelling   Simvastatin Other (See Comments)    Severe rhabdomyolysis.   Wellbutrin [Bupropion] Swelling    Facial Swelling    ROS    Physical Exam: There were no vitals filed  for this visit.  General: The patient is alert and oriented x3 in no acute distress.  Dermatology: Mild darkened discoloration to the left lower extremity versus the right posterior medial ankle and dorsal foot.  Large ringworm lesion dorsal lateral foot and smaller 1 cm lesion medial ankle noted. Skin in the effected area is mildly erythematous.  Vascular: Palpable pedal pulses bilaterally. Capillary refill within normal limits.  Left lower extremity edematous compared to the right.  Neurological: Light touch sensation grossly intact bilateral feet.   Musculoskeletal Exam: Muscle strength 5/5 for all  major muscle groups.  Left foot bunion deformity noted.  Minimal pain.  Some limitation in 1st MPJ range of motion.  Radiographic Exam: Left foot ankle radiographs 04/01/2024 Normal osseous mineralization. No fractures or acute osseous irregularities noted.  Some talonavicular joint spurring noted.  Bunion deformity noted.  Assessment/Plan of Care: 1. Capsulitis   2. Dermatitis   3. Bunion, left foot      Meds ordered this encounter  Medications   triamcinolone cream (KENALOG) 0.1 %    Sig: Apply 1 Application topically 2 (two) times daily. Apply to iching, inflammed skin    Dispense:  30 g    Refill:  0   None  Discussed clinical findings with patient today.  Do suspect that given the onset this could be related to her injectable medication.  Symptoms could be dermatitis related or due to hypersensitivity, though this was unrelated to the injection site from the medication.  Ankle pain about this area does not seem musculoskeletal in nature  We will try low-dose steroid to this area to see if there is any improvement.   Raydin Bielinski L. Lunda Salines, AACFAS [facility]     [address] [place], [place] [address]                Office [phone]  Fax [phone]

## 2024-05-06 ENCOUNTER — Ambulatory Visit: Admitting: Podiatry

## 2024-05-15 ENCOUNTER — Other Ambulatory Visit: Payer: Self-pay | Admitting: Hematology & Oncology

## 2024-05-15 DIAGNOSIS — C186 Malignant neoplasm of descending colon: Secondary | ICD-10-CM

## 2024-05-15 DIAGNOSIS — D501 Sideropenic dysphagia: Secondary | ICD-10-CM

## 2024-05-15 DIAGNOSIS — I82413 Acute embolism and thrombosis of femoral vein, bilateral: Secondary | ICD-10-CM

## 2024-05-16 ENCOUNTER — Encounter: Payer: Self-pay | Admitting: Hematology & Oncology

## 2024-05-24 ENCOUNTER — Other Ambulatory Visit: Payer: Self-pay

## 2024-05-24 DIAGNOSIS — E785 Hyperlipidemia, unspecified: Secondary | ICD-10-CM

## 2024-05-30 ENCOUNTER — Telehealth: Payer: Self-pay

## 2024-05-30 NOTE — Telephone Encounter (Signed)
 Received a fax from [facility] stating that pt is scheduled for a tooth extraction and due to her history of bleeding and being on Xarelto , they would like to know how long to stop the medication prior to the procedure. Per Dr Maria Shiner- stop medication 2 days prior to procedure. Spoke with receptionist and advised her of this and she confirmed she would inform the pt. Pt had already taken her Xarelto  today so her procedure was moved from Wednesday, 06/01/24 to Thursday, 06/02/24.

## 2024-06-09 ENCOUNTER — Encounter: Payer: Self-pay | Admitting: Hematology & Oncology

## 2024-08-03 ENCOUNTER — Other Ambulatory Visit: Payer: Self-pay

## 2024-08-03 ENCOUNTER — Inpatient Hospital Stay: Payer: BC Managed Care – PPO | Attending: Hematology & Oncology

## 2024-08-03 ENCOUNTER — Encounter: Payer: Self-pay | Admitting: Family

## 2024-08-03 ENCOUNTER — Inpatient Hospital Stay: Payer: BC Managed Care – PPO | Admitting: Family

## 2024-08-03 VITALS — BP 127/75 | HR 79 | Temp 98.0°F | Resp 19

## 2024-08-03 DIAGNOSIS — Z7901 Long term (current) use of anticoagulants: Secondary | ICD-10-CM | POA: Insufficient documentation

## 2024-08-03 DIAGNOSIS — C187 Malignant neoplasm of sigmoid colon: Secondary | ICD-10-CM | POA: Diagnosis not present

## 2024-08-03 DIAGNOSIS — E611 Iron deficiency: Secondary | ICD-10-CM | POA: Diagnosis not present

## 2024-08-03 DIAGNOSIS — I82411 Acute embolism and thrombosis of right femoral vein: Secondary | ICD-10-CM

## 2024-08-03 DIAGNOSIS — C186 Malignant neoplasm of descending colon: Secondary | ICD-10-CM

## 2024-08-03 DIAGNOSIS — D500 Iron deficiency anemia secondary to blood loss (chronic): Secondary | ICD-10-CM

## 2024-08-03 DIAGNOSIS — Z86718 Personal history of other venous thrombosis and embolism: Secondary | ICD-10-CM | POA: Diagnosis not present

## 2024-08-03 DIAGNOSIS — Z85038 Personal history of other malignant neoplasm of large intestine: Secondary | ICD-10-CM | POA: Diagnosis present

## 2024-08-03 LAB — IRON AND IRON BINDING CAPACITY (CC-WL,HP ONLY)
Iron: 66 ug/dL (ref 28–170)
Saturation Ratios: 16 % (ref 10.4–31.8)
TIBC: 423 ug/dL (ref 250–450)
UIBC: 357 ug/dL

## 2024-08-03 LAB — CBC WITH DIFFERENTIAL (CANCER CENTER ONLY)
Abs Immature Granulocytes: 0.02 K/uL (ref 0.00–0.07)
Basophils Absolute: 0 K/uL (ref 0.0–0.1)
Basophils Relative: 1 %
Eosinophils Absolute: 0.2 K/uL (ref 0.0–0.5)
Eosinophils Relative: 4 %
HCT: 37.5 % (ref 36.0–46.0)
Hemoglobin: 13.3 g/dL (ref 12.0–15.0)
Immature Granulocytes: 0 %
Lymphocytes Relative: 39 %
Lymphs Abs: 1.9 K/uL (ref 0.7–4.0)
MCH: 31.4 pg (ref 26.0–34.0)
MCHC: 35.5 g/dL (ref 30.0–36.0)
MCV: 88.4 fL (ref 80.0–100.0)
Monocytes Absolute: 0.3 K/uL (ref 0.1–1.0)
Monocytes Relative: 7 %
Neutro Abs: 2.5 K/uL (ref 1.7–7.7)
Neutrophils Relative %: 49 %
Platelet Count: 154 K/uL (ref 150–400)
RBC: 4.24 MIL/uL (ref 3.87–5.11)
RDW: 12.8 % (ref 11.5–15.5)
WBC Count: 4.9 K/uL (ref 4.0–10.5)
nRBC: 0 % (ref 0.0–0.2)

## 2024-08-03 LAB — CMP (CANCER CENTER ONLY)
ALT: 19 U/L (ref 0–44)
AST: 20 U/L (ref 15–41)
Albumin: 4.6 g/dL (ref 3.5–5.0)
Alkaline Phosphatase: 83 U/L (ref 38–126)
Anion gap: 13 (ref 5–15)
BUN: 13 mg/dL (ref 8–23)
CO2: 27 mmol/L (ref 22–32)
Calcium: 9.4 mg/dL (ref 8.9–10.3)
Chloride: 103 mmol/L (ref 98–111)
Creatinine: 0.76 mg/dL (ref 0.44–1.00)
GFR, Estimated: >60 mL/min (ref >60)
Glucose, Bld: 106 mg/dL — ABNORMAL HIGH (ref 70–99)
Potassium: 3.7 mmol/L (ref 3.5–5.1)
Sodium: 143 mmol/L (ref 135–145)
Total Bilirubin: 0.6 mg/dL (ref 0.0–1.2)
Total Protein: 7 g/dL (ref 6.5–8.1)

## 2024-08-03 LAB — LACTATE DEHYDROGENASE: LDH: 218 U/L — ABNORMAL HIGH (ref 98–192)

## 2024-08-03 LAB — FERRITIN: Ferritin: 338 ng/mL — ABNORMAL HIGH (ref 11–307)

## 2024-08-03 LAB — CEA (ACCESS): CEA (CHCC): 6.49 ng/mL — ABNORMAL HIGH (ref 0.00–5.00)

## 2024-08-03 NOTE — Progress Notes (Signed)
 Hematology and Oncology Follow Up Visit  Mallory Morales [id number] 08-21-1956 68 y.o. 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 She is doing well but has had some hot flashes at night recently.  CEA last visit was 5.32. Today's result is pending.  No adenopathy or lymphedema noted on exam.  No blood loss noted. No bruising or petechiae.  She has not had any issue with frequent or recurrent infections. No fever, chills, n/v, cough, rash, dizziness, SOB, chest pain, palpitations, abdominal pain or changes in bowel or bladder habits.  She has some abdominal bloating and flatus not associated with foods.  She has swelling in the left ankle that waxes and wanes.  No falls or syncope.  Appetite and hydration are good. Weight is stable at 191 lbs.   ECOG Performance Status: 0 - Asymptomatic  Medications:  Allergies as of 08/03/2024       Reactions   Lisinopril Swelling   Lip swelling   Simvastatin  Other (See Comments)   Severe rhabdomyolysis.   Wellbutrin  [bupropion ] Swelling   Facial Swelling        Medication List        Accurate as of August 03, 2024  2:19 PM. If you have any questions, ask your nurse or doctor.          amLODipine 2.5 MG tablet Commonly known as: NORVASC Take 2.5 mg by mouth daily.   fluvastatin 40 MG capsule Commonly known as: LESCOL Take 40 mg by mouth at bedtime. Take 2 capsules by mouth daily   hydrochlorothiazide  25 MG tablet Commonly known as: HYDRODIURIL  Take 1 tablet (25 mg total) by mouth daily. Takes 1 tab in the morning   metFORMIN  500 MG tablet Commonly known as: GLUCOPHAGE  Take 1 tablet (500  mg total) by mouth daily.   Repatha  SureClick 140 MG/ML Soaj Generic drug: Evolocumab  Inject 140 mg into the skin every 14 (fourteen) days.   rivaroxaban  2.5 MG Tabs tablet Commonly known as: XARELTO  Take 2 tablets by mouth once daily   triamcinolone  cream 0.1 % Commonly known as: KENALOG  Apply 1 Application topically 2 (two) times daily. Apply to iching, inflammed skin   Vitamin D  (Ergocalciferol ) 1.25 MG (50000 UNIT) Caps capsule Commonly known as: DRISDOL  Take 1 capsule (50,000 Units total) by mouth every 14 (fourteen) days.        Allergies:  Allergies  Allergen Reactions   Lisinopril Swelling    Lip swelling   Simvastatin  Other (See Comments)    Severe rhabdomyolysis.   Wellbutrin  [Bupropion ] Swelling    Facial Swelling    Past Medical History, Surgical history, Social history, and Family History were reviewed and updated.  Review of Systems: All other 10 point review of systems is negative.   Physical Exam:  vitals were not taken for this visit.   Wt Readings from Last 3 Encounters:  08/04/23 191 lb 8 oz (86.9 kg)  07/24/23 193 lb 12.8 oz (87.9 kg)  03/31/23 189 lb 3.2 oz (85.8 kg)    Ocular: Sclerae unicteric, pupils equal, round and reactive to light Ear-nose-throat: Oropharynx clear, dentition fair Lymphatic: No  cervical or supraclavicular adenopathy Lungs no rales or rhonchi, good excursion bilaterally Heart regular rate and rhythm, no murmur appreciated Abd soft, nontender, positive bowel sounds MSK no focal spinal tenderness, no joint edema Neuro: non-focal, well-oriented, appropriate affect Breasts: Deferred   Lab Results  Component Value Date   WBC 4.1 02/03/2024   HGB 13.3 02/03/2024   HCT 37.0 02/03/2024   MCV 87.3 02/03/2024   PLT 149 (L) 02/03/2024   Lab Results  Component Value Date   FERRITIN 143 02/03/2024   IRON 149 02/03/2024   TIBC 379 02/03/2024   UIBC 230 02/03/2024   IRONPCTSAT 39 (H) 02/03/2024   Lab Results   Component Value Date   RETICCTPCT 1.2 03/01/2015   RBC 4.24 02/03/2024   RETICCTABS 52.1 03/01/2015   No results found for: KPAFRELGTCHN, LAMBDASER, KAPLAMBRATIO No results found for: IGGSERUM, IGA, IGMSERUM No results found for: STEPHANY CARLOTA BENSON MARKEL EARLA JOANNIE DOC, MSPIKE, SPEI   Chemistry      Component Value Date/Time   NA 142 02/03/2024 0805   NA 141 07/10/2023 1500   NA 146 (H) 10/16/2017 0804   NA 141 03/13/2017 0807   K 3.8 02/03/2024 0805   K 3.8 10/16/2017 0804   K 3.9 03/13/2017 0807   CL 105 02/03/2024 0805   CL 106 10/16/2017 0804   CO2 29 02/03/2024 0805   CO2 29 10/16/2017 0804   CO2 27 03/13/2017 0807   BUN 12 02/03/2024 0805   BUN 17 07/10/2023 1500   BUN 10 10/16/2017 0804   BUN 12.4 03/13/2017 0807   CREATININE 0.74 02/03/2024 0805   CREATININE 1.0 10/16/2017 0804   CREATININE 0.8 03/13/2017 0807      Component Value Date/Time   CALCIUM 9.9 02/03/2024 0805   CALCIUM 9.3 10/16/2017 0804   CALCIUM 9.6 03/13/2017 0807   ALKPHOS 69 08/04/2023 0818   ALKPHOS 72 10/16/2017 0804   ALKPHOS 70 03/13/2017 0807   AST 22 08/04/2023 0818   AST 16 03/13/2017 0807   ALT 22 08/04/2023 0818   ALT 28 10/16/2017 0804   ALT 18 03/13/2017 0807   BILITOT 0.8 08/04/2023 0818   BILITOT 0.93 03/13/2017 0807       Impression and Plan: Mallory Morales is a pleasant 68 yo African American female with history of stage II adenocarcinoma of the sigmoid colon, Oncotype score was 20. She had 36 lymph nodes that were negative. She had her surgery in January of 2013 and did not require adjuvant treatment.  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  Hyper coag panel was unremarkable.  She will continue her same regimen with maintenance Xarelto  5 mg PO daily.  CEA pending.  Iron studies are pending. We will replace if needed.  Follow-up in 1 year, patient preference due to cost.   Lauraine Pepper,  NP 8/13/20252:19 PM

## 2024-08-05 ENCOUNTER — Other Ambulatory Visit: Payer: Self-pay | Admitting: Hematology & Oncology

## 2024-08-05 DIAGNOSIS — D501 Sideropenic dysphagia: Secondary | ICD-10-CM

## 2024-08-05 DIAGNOSIS — C186 Malignant neoplasm of descending colon: Secondary | ICD-10-CM

## 2024-08-05 DIAGNOSIS — I82413 Acute embolism and thrombosis of femoral vein, bilateral: Secondary | ICD-10-CM

## 2024-08-09 ENCOUNTER — Encounter: Payer: Self-pay | Admitting: Hematology & Oncology

## 2024-08-10 ENCOUNTER — Ambulatory Visit: Payer: Self-pay | Admitting: *Deleted

## 2024-08-10 ENCOUNTER — Other Ambulatory Visit: Payer: Self-pay | Admitting: Family

## 2024-08-10 DIAGNOSIS — R918 Other nonspecific abnormal finding of lung field: Secondary | ICD-10-CM

## 2024-08-10 DIAGNOSIS — C187 Malignant neoplasm of sigmoid colon: Secondary | ICD-10-CM

## 2024-08-10 DIAGNOSIS — C186 Malignant neoplasm of descending colon: Secondary | ICD-10-CM

## 2024-08-10 NOTE — Telephone Encounter (Signed)
 Advised via MyChart.

## 2024-08-10 NOTE — Telephone Encounter (Signed)
-----   Message from Lauraine Pepper sent at 08/10/2024  2:55 PM EDT ----- Let her know her counts all look stable and that Dr. Timmy wants her to have a CT scan annually for surveillance for now since her CEA stays slightly elevated. Again this has remained stable! I'm  putting her orders in to get it in the next 1-2 weeks. She can call and schedule if she wants.   Sarah ----- Message ----- From: Timmy Maude SAUNDERS, MD Sent: 08/08/2024   4:46 PM EDT To: Lauraine CHRISTELLA Pepper, NP  I am not surprised.  However, I do think we are probably going to do some more scans on her.  If you could please set this up.  Jeralyn ----- Message ----- From: Pepper Lauraine CHRISTELLA, NP Sent: 08/08/2024  10:35 AM EDT To: Maude SAUNDERS Timmy, MD  We did scans last year for the elevated CEA and they were negative. Is it common to remain elevated chronically?   Sarah ----- Message ----- From: Rebecka, [facility] [place] Sent: 08/03/2024   2:24 PM EDT To: Lauraine CHRISTELLA Pepper, NP

## 2024-08-17 ENCOUNTER — Ambulatory Visit (HOSPITAL_BASED_OUTPATIENT_CLINIC_OR_DEPARTMENT_OTHER)

## 2024-08-18 ENCOUNTER — Encounter: Payer: Self-pay | Admitting: Hematology & Oncology

## 2024-08-20 ENCOUNTER — Ambulatory Visit (HOSPITAL_BASED_OUTPATIENT_CLINIC_OR_DEPARTMENT_OTHER)

## 2024-08-26 ENCOUNTER — Encounter: Payer: Self-pay | Admitting: Hematology & Oncology

## 2024-08-27 ENCOUNTER — Ambulatory Visit (HOSPITAL_BASED_OUTPATIENT_CLINIC_OR_DEPARTMENT_OTHER)
Admission: RE | Admit: 2024-08-27 | Discharge: 2024-08-27 | Disposition: A | Discharge status: Home / Self Care | Source: Ambulatory Visit | Attending: Family | Admitting: Family

## 2024-08-27 DIAGNOSIS — C187 Malignant neoplasm of sigmoid colon: Secondary | ICD-10-CM | POA: Diagnosis present

## 2024-08-27 DIAGNOSIS — R918 Other nonspecific abnormal finding of lung field: Secondary | ICD-10-CM | POA: Insufficient documentation

## 2024-08-27 DIAGNOSIS — C186 Malignant neoplasm of descending colon: Secondary | ICD-10-CM | POA: Insufficient documentation

## 2024-08-27 MED ORDER — IOHEXOL 300 MG/ML  SOLN
100.0000 mL | Freq: Once | INTRAMUSCULAR | Status: AC | PRN
  Administered 2024-08-27: 100 mL via INTRAVENOUS

## 2024-08-31 ENCOUNTER — Encounter: Payer: Self-pay | Admitting: Family

## 2024-08-31 LAB — NMR, LIPOPROFILE
Cholesterol, Total: 121 mg/dL (ref 100–199)
HDL Particle Number: 54.1 umol/L (ref >=30.5)
HDL-C: 79 mg/dL (ref >39)
LDL Particle Number: 473 nmol/L (ref <1000)
LDL Size: 20.4 nm — ABNORMAL LOW (ref >20.5)
LDL-C (NIH Calc): 28 mg/dL (ref 0–99)
LP-IR Score: 50 — ABNORMAL HIGH (ref <=45)
Small LDL Particle Number: 239 nmol/L (ref <=527)
Triglycerides: 67 mg/dL (ref 0–149)

## 2024-09-01 ENCOUNTER — Encounter: Payer: Self-pay | Admitting: Internal Medicine

## 2024-09-01 ENCOUNTER — Ambulatory Visit: Attending: Internal Medicine | Admitting: Internal Medicine

## 2024-09-01 VITALS — BP 136/89 | HR 90 | Ht 65.0 in | Wt 190.4 lb

## 2024-09-01 DIAGNOSIS — E7841 Elevated Lipoprotein(a): Secondary | ICD-10-CM | POA: Diagnosis not present

## 2024-09-01 DIAGNOSIS — E785 Hyperlipidemia, unspecified: Secondary | ICD-10-CM

## 2024-09-01 DIAGNOSIS — Z86718 Personal history of other venous thrombosis and embolism: Secondary | ICD-10-CM | POA: Diagnosis not present

## 2024-09-01 DIAGNOSIS — I10 Essential (primary) hypertension: Secondary | ICD-10-CM

## 2024-09-01 DIAGNOSIS — Z8249 Family history of ischemic heart disease and other diseases of the circulatory system: Secondary | ICD-10-CM

## 2024-09-01 NOTE — Progress Notes (Addendum)
 LIPID CLINIC CONSULT NOTE  Chief Complaint:  Follow-up dyslipidemia  Primary Care Physician: Auston Opal, DO  Primary Cardiologist:  None  HPI:  Mallory Morales is a 68 y.o. female who is being seen today for the evaluation of dyslipidemia at the request of No ref. provider found. this is a 68 year old female kindly referred for evaluation management of dyslipidemia.  She reports family history of heart disease including her father who had an MI in her sister who has heart failure.  She has another sister who has dementia and she is quite concerned that she may be a genetic risk for this.  I reviewed the connection with APO E regarding dementia and high cholesterol which may be an issue for her.  She has tried numerous statins in the past and had side effects but recently has been on fluvastatin which is tolerated.  That being said her cholesterol remains high with total 234, HDL 84, triglycerides 107 and LDL 133.  She says that the statins have not really lowered her cholesterol much in the past which makes me wonder if she has a high LP(a).  As far as I know this has not been assessed.  She is on Xarelto  due to a history of recurrent blood clots which would also potentially be consistent with a high LP(a).  07/24/2023  Mallory Morales returns today for follow-up dyslipidemia.  She has been on therapy with Repatha .  She is tolerating this very well.  She has had a marked reduction in her LP(a) from 505 nmol/L down to 276 nmol/L.  Her overall lipid profile is low as well with a particle number of 453, LDL 39, HDL 71 and triglycerides 134.  09/01/2024  Mallory Morales is seen today in follow-up.  Overall she is doing well.  She continues to have excellent control of her lipids.  Her LDL particle number is 473 with an LDL of 28, HDL 79 and and triglycerides 67.  Blood pressure is a little elevated today however she said she has not taken her meds today.  She denies any chest pain or shortness of  breath.  A1c recently has crept up in March to 6.2%.  PMHx:  Past Medical History:  Diagnosis Date   Anemia, iron deficiency 01/27/2012   Back pain    Cancer (HCC)    Clotting disorder (HCC)    Colon cancer (HCC)    T3N0M0, emergency surgery/with colostomy done-no further tx.   Complication of anesthesia    hard time getting breath post op   Constipation    DVT (deep venous thrombosis) (HCC)    Edema of both lower extremities    Fatigue    History of stomach ulcers    Hyperlipemia    Hypertension    Joint pain    Lactose intolerance    Prediabetes    Pulmonary embolism (HCC)    taking injections -last done 1'14(Arixtra )   SOB (shortness of breath)     Past Surgical History:  Procedure Laterality Date   ABDOMINAL HYSTERECTOMY     COLON RESECTION  01/14/2012   Procedure: COLON RESECTION;  Surgeon: Krystal JINNY Russell, MD;  Location: [facility] OR;  Service: General;  Laterality: N/A;  left colectomy, mobilization of splenic flexure, ostomy.   COLOSTOMY  01/14/2012   COLOSTOMY TAKEDOWN N/A 02/24/2013   Procedure: Laparoscopic Assisted Colostomy Closure;  Surgeon: Krystal JINNY Russell, MD;  Location: [facility] ORS;  Service: General;  Laterality: N/A;  Laparoscopic Assisted Colostomy Closure  FLEXIBLE SIGMOIDOSCOPY N/A 03/17/2014   Procedure: FLEXIBLE SIGMOIDOSCOPY;  Surgeon: Belvie JONETTA Just, MD;  Location: [facility];  Service: Endoscopy;  Laterality: N/A;   FLEXIBLE SIGMOIDOSCOPY N/A 04/07/2014   Procedure: FLEXIBLE SIGMOIDOSCOPY;  Surgeon: Belvie JONETTA Just, MD;  Location: [facility];  Service: Endoscopy;  Laterality: N/A;  no sedation   HERNIA REPAIR     HOT HEMOSTASIS N/A 03/17/2014   Procedure: HOT HEMOSTASIS (ARGON PLASMA COAGULATION/BICAP);  Surgeon: Belvie JONETTA Just, MD;  Location: [facility];  Service: Endoscopy;  Laterality: N/A;   HOT HEMOSTASIS N/A 04/07/2014   Procedure: HOT HEMOSTASIS (ARGON PLASMA COAGULATION/BICAP);  Surgeon: Belvie JONETTA Just, MD;  Location: [facility];  Service:  Endoscopy;  Laterality: N/A;    FAMHx:  Family History  Problem Relation Age of Onset   Hypertension Mother    Alcoholism Mother    Obesity Mother    Alcoholism Father    Hyperlipidemia Father    Heart attack Father    Hypertension Father    Heart disease Father    Stomach cancer Sister    Pancreatic cancer Brother    Cancer Brother        pancreatic    SOCHx:   reports that she quit smoking about 39 years ago. Her smoking use included cigarettes. She started smoking about 50 years ago. She has a 5.5 pack-year smoking history. She has never used smokeless tobacco. She reports current alcohol use of about 1.0 standard drink of alcohol per week. She reports that she does not use drugs.  ALLERGIES:  Allergies  Allergen Reactions   Lisinopril Swelling    Lip swelling   Simvastatin  Other (See Comments)    Severe rhabdomyolysis.   Wellbutrin  [Bupropion ] Swelling    Facial Swelling    ROS: Pertinent items noted in HPI and remainder of comprehensive ROS otherwise negative.  HOME MEDS: Current Outpatient Medications on File Prior to Visit  Medication Sig Dispense Refill   amLODipine (NORVASC) 2.5 MG tablet Take 2.5 mg by mouth daily.     Evolocumab  (REPATHA  SURECLICK) 140 MG/ML SOAJ Inject 140 mg into the skin every 14 (fourteen) days. 6 mL 2   fluvastatin (LESCOL) 40 MG capsule Take 40 mg by mouth at bedtime. Take 2 capsules by mouth daily     hydrochlorothiazide  (HYDRODIURIL ) 25 MG tablet Take 1 tablet (25 mg total) by mouth daily. Takes 1 tab in the morning 30 tablet 0   metFORMIN  (GLUCOPHAGE ) 500 MG tablet Take 1 tablet (500 mg total) by mouth daily. 30 tablet 0   rivaroxaban  (XARELTO ) 2.5 MG TABS tablet Take 2 tablets by mouth once daily 180 tablet 0   triamcinolone  cream (KENALOG ) 0.1 % Apply 1 Application topically 2 (two) times daily. Apply to iching, inflammed skin 30 g 0   Vitamin D , Ergocalciferol , (DRISDOL ) 1.25 MG (50000 UNIT) CAPS capsule Take 1 capsule (50,000  Units total) by mouth every 14 (fourteen) days. 2 capsule 0   No current facility-administered medications on file prior to visit.    LABS/IMAGING: Results for orders placed or performed in visit on 05/24/24 (from the past 48 hours)  NMR, lipoprofile     Status: Abnormal   Collection Time: 08/30/24  9:05 AM  Result Value Ref Range   LDL Particle Number 473 <1,000 nmol/L    Comment:                           Low                   <  1000                           Moderate         1000 - 1299                           Borderline-High  1300 - 1599                           High             1600 - 2000                           Very High             > 2000    LDL-C (NIH Calc) 28 0 - 99 mg/dL    Comment:                           Optimal               <  100                           Above optimal     100 -  129                           Borderline        130 -  159                           High              160 -  189                           Very high             >  189    HDL-C 79 >39 mg/dL   Triglycerides 67 0 - 149 mg/dL   Cholesterol, Total 878 100 - 199 mg/dL   HDL Particle Number [id number] >=30.5 umol/L   Small LDL Particle Number 239 <=527 nmol/L   LDL Size 20.4 (L) >20.5 nm    Comment:  ----------------------------------------------------------                  ** INTERPRETATIVE INFORMATION**                  PARTICLE CONCENTRATION AND SIZE                     <--Lower CVD Risk   Higher CVD Risk-->   LDL AND HDL PARTICLES   Percentile in Reference Population   HDL-P (total)        High     75th    50th    25th   Low                        >34.9    34.9    30.5    26.7   <26.7   Small LDL-P          Low      25th  50th    75th   High                        <117     117     527     839    >839   LDL Size   <-Large (Pattern A)->    <-Small (Pattern B)->                     23.0    20.6           20.5      19.0  ---------------------------------------------------------- Small  LDL-P and LDL Size are associated with CVD risk, but not after LDL-P is taken into account.    LP-IR Score 50 (H) <=45    Comment: INSULIN  RESISTANCE MARKER     Insulin  Sensitive    Insulin  Resistant-->            Percentile in Reference Population Insulin  Resistance Score LP-IR Score   Low   25th   50th   75th   High               <27   27     45     63     >63 LP-IR Score is inaccurate if patient is non-fasting. The LP-IR score is a laboratory developed index that has been associated with insulin  resistance and diabetes risk and should be used as one component of a physician's clinical assessment.    No results found.  LIPID PANEL:    Component Value Date/Time   CHOL 234 (H) 11/27/2022 0906   TRIG 107 11/27/2022 0906   HDL 83 11/27/2022 0906   CHOLHDL 3.1 12/18/2021 1038   CHOLHDL 3.3 12/14/2016 0531   VLDL 18 12/14/2016 0531   LDLCALC 133 (H) 11/27/2022 0906    WEIGHTS: Wt Readings from Last 3 Encounters:  09/01/24 190 lb 6.4 oz (86.4 kg)  08/04/23 191 lb 8 oz (86.9 kg)  07/24/23 193 lb 12.8 oz (87.9 kg)    VITALS: BP 136/89 (BP Location: Left Arm, Patient Position: Sitting)   Pulse 90   Ht 5' 5 (1.651 m)   Wt 190 lb 6.4 oz (86.4 kg)   SpO2 99%   BMI 31.68 kg/m   EXAM: General appearance: alert and no distress Lungs: clear to auscultation bilaterally Heart: regular rate and rhythm, S1, S2 normal, no murmur, click, rub or gallop Extremities: extremities normal, atraumatic, no cyanosis or edema Neurologic: Grossly normal  EKG: Deferred  ASSESSMENT: Mixed dyslipidemia, goal LDL less than 70 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   Ms. Imparato continues to have an excellent response to her lipids and is at target.  She had a marked  reduction in LP(a).  She remains on low-dose Xarelto  given her history of thrombosis which may have been partially related to a high LP(a).  Blood pressure is a little elevated today however she has not taken her medications.  There is a strong family history of early onset heart disease although CT scans have only shown aortic atherosclerosis.  She may qualify for an upcoming preventative clinical trial.  Will hold on her name for that.  Otherwise no medicine changes at this time.  Will schedule follow-up with Rosaline Bane at  the [facility] in 1 year.   Vinie KYM Maxcy, MD, [facility], FNLA, FACP  [place]  [facility]  Medical Director of the Advanced Lipid Disorders &  Cardiovascular Risk Reduction Clinic Diplomate of the American Board of Clinical Lipidology Attending Cardiologist  Direct Dial: [phone]  Fax: [phone]  Website:  [email or web address] Vinie BROCKS Doyce Stonehouse 09/01/2024, 8:51 AM

## 2024-09-01 NOTE — Patient Instructions (Signed)
 Medication Instructions:  Your physician recommends that you continue on your current medications as directed. Please refer to the Current Medication list given to you today.  *If you need a refill on your cardiac medications before your next appointment, please call your pharmacy*  Lab Work: None ordered.  If you have labs (blood work) drawn today and your tests are completely normal, you will receive your results only by: MyChart Message (if you have MyChart) OR A paper copy in the mail If you have any lab test that is abnormal or we need to change your treatment, we will call you to review the results.  Testing/Procedures: None ordered.   Follow-Up: At [facility], you and your health needs are our priority.  As part of our continuing mission to provide you with exceptional heart care, our providers are all part of one team.  This team includes your primary Cardiologist (physician) and Advanced Practice Providers or APPs (Physician Assistants and Nurse Practitioners) who all work together to provide you with the care you need, when you need it.  Your next appointment:   12 months with Rosaline Bane, NP for Lipid follow up

## 2024-09-04 ENCOUNTER — Encounter: Payer: Self-pay | Admitting: Internal Medicine

## 2024-09-07 ENCOUNTER — Ambulatory Visit: Payer: Self-pay

## 2024-10-11 ENCOUNTER — Other Ambulatory Visit: Payer: Self-pay | Admitting: Physician Assistant

## 2024-10-14 LAB — DERMATOLOGY PATHOLOGY

## 2024-10-29 ENCOUNTER — Other Ambulatory Visit: Payer: Self-pay | Admitting: Hematology & Oncology

## 2024-10-29 DIAGNOSIS — C186 Malignant neoplasm of descending colon: Secondary | ICD-10-CM

## 2024-10-29 DIAGNOSIS — I82413 Acute embolism and thrombosis of femoral vein, bilateral: Secondary | ICD-10-CM

## 2024-10-29 DIAGNOSIS — D501 Sideropenic dysphagia: Secondary | ICD-10-CM

## 2024-10-31 ENCOUNTER — Encounter: Payer: Self-pay | Admitting: Hematology & Oncology

## 2025-01-24 ENCOUNTER — Other Ambulatory Visit: Payer: Self-pay | Admitting: Hematology & Oncology

## 2025-01-24 DIAGNOSIS — I82413 Acute embolism and thrombosis of femoral vein, bilateral: Secondary | ICD-10-CM

## 2025-01-24 DIAGNOSIS — D501 Sideropenic dysphagia: Secondary | ICD-10-CM

## 2025-01-24 DIAGNOSIS — C186 Malignant neoplasm of descending colon: Secondary | ICD-10-CM

## 2025-08-04 ENCOUNTER — Ambulatory Visit: Admitting: Family

## 2025-08-04 ENCOUNTER — Inpatient Hospital Stay
